# Patient Record
Sex: Female | Born: 1980
Health system: Southern US, Community
[De-identification: ages and names within clinical notes are randomized; demographics above are authoritative.]

## PROBLEM LIST (undated history)

## (undated) ENCOUNTER — Inpatient Hospital Stay (HOSPITAL_COMMUNITY): Payer: Medicaid Other

## (undated) ENCOUNTER — Inpatient Hospital Stay (HOSPITAL_COMMUNITY): Payer: Self-pay

## (undated) DIAGNOSIS — Z5189 Encounter for other specified aftercare: Secondary | ICD-10-CM

## (undated) DIAGNOSIS — I10 Essential (primary) hypertension: Secondary | ICD-10-CM

## (undated) DIAGNOSIS — K219 Gastro-esophageal reflux disease without esophagitis: Secondary | ICD-10-CM

## (undated) DIAGNOSIS — F32A Depression, unspecified: Secondary | ICD-10-CM

## (undated) DIAGNOSIS — F329 Major depressive disorder, single episode, unspecified: Secondary | ICD-10-CM

## (undated) DIAGNOSIS — O09299 Supervision of pregnancy with other poor reproductive or obstetric history, unspecified trimester: Secondary | ICD-10-CM

## (undated) DIAGNOSIS — F419 Anxiety disorder, unspecified: Secondary | ICD-10-CM

## (undated) DIAGNOSIS — D649 Anemia, unspecified: Secondary | ICD-10-CM

## (undated) HISTORY — PX: HEMORRHOID SURGERY: SHX153

## (undated) HISTORY — DX: Supervision of pregnancy with other poor reproductive or obstetric history, unspecified trimester: O09.299

---

## 2012-02-01 ENCOUNTER — Encounter (HOSPITAL_COMMUNITY): Payer: Self-pay | Admitting: Emergency Medicine

## 2012-02-01 ENCOUNTER — Emergency Department (HOSPITAL_COMMUNITY)
Admission: EM | Admit: 2012-02-01 | Discharge: 2012-02-01 | Disposition: A | Discharge status: Home / Self Care | Payer: Medicaid Other | Attending: Emergency Medicine | Admitting: Emergency Medicine

## 2012-02-01 DIAGNOSIS — H53149 Visual discomfort, unspecified: Secondary | ICD-10-CM | POA: Insufficient documentation

## 2012-02-01 DIAGNOSIS — H5789 Other specified disorders of eye and adnexa: Secondary | ICD-10-CM | POA: Insufficient documentation

## 2012-02-01 DIAGNOSIS — H11419 Vascular abnormalities of conjunctiva, unspecified eye: Secondary | ICD-10-CM | POA: Insufficient documentation

## 2012-02-01 DIAGNOSIS — H1030 Unspecified acute conjunctivitis, unspecified eye: Secondary | ICD-10-CM | POA: Insufficient documentation

## 2012-02-01 DIAGNOSIS — H1032 Unspecified acute conjunctivitis, left eye: Secondary | ICD-10-CM

## 2012-02-01 DIAGNOSIS — F17200 Nicotine dependence, unspecified, uncomplicated: Secondary | ICD-10-CM | POA: Insufficient documentation

## 2012-02-01 DIAGNOSIS — J3489 Other specified disorders of nose and nasal sinuses: Secondary | ICD-10-CM | POA: Insufficient documentation

## 2012-02-01 DIAGNOSIS — I10 Essential (primary) hypertension: Secondary | ICD-10-CM | POA: Insufficient documentation

## 2012-02-01 DIAGNOSIS — H579 Unspecified disorder of eye and adnexa: Secondary | ICD-10-CM | POA: Insufficient documentation

## 2012-02-01 HISTORY — DX: Essential (primary) hypertension: I10

## 2012-02-01 MED ORDER — POLYMYXIN B-TRIMETHOPRIM 10000-0.1 UNIT/ML-% OP SOLN: 1.0000 [drp] | OPHTHALMIC | Status: AC

## 2012-02-01 MED ORDER — POLYMYXIN B-TRIMETHOPRIM 10000-0.1 UNIT/ML-% OP SOLN
1.0000 [drp] | OPHTHALMIC | Status: DC
  Filled 2012-02-01: qty 10

## 2012-02-01 MED ORDER — FLUORESCEIN SODIUM 1 MG OP STRP
1.0000 | ORAL_STRIP | Freq: Once | OPHTHALMIC | Status: AC
  Administered 2012-02-01: 1 via OPHTHALMIC
  Filled 2012-02-01: qty 1

## 2012-02-01 NOTE — ED Notes (Signed)
Pt states she has irritation to her left eye and thinks she has pink eye

## 2012-02-01 NOTE — ED Provider Notes (Signed)
History     CSN: [id number]  Arrival date & time 02/01/12  2145   First MD Initiated Contact with Patient 02/01/12 2202      Chief Complaint  Patient presents with  . Conjunctivitis    (Consider location/radiation/quality/duration/timing/severity/associated sxs/prior treatment) HPI  31yo female c/o L eye redness x 1 day.  Sts she woke up this am noticing redness to L eye, with mild eye irritation and light sensitivity.  Denies significant pain, eye matted shut, double vision.  Denies recent trauma.  Pt normally wear prescription glasses but just recently switch to contact lenses.  Sts her son was diagnosed with conjunctivitis a few days ago.  Pt also complain of runnynose but denies fever, headache, pressure behind eye, sore throat, neck pain, numbness or weakness.    Past Medical History  Diagnosis Date  . Hypertension     History reviewed. No pertinent past surgical history.  Family History  Problem Relation Age of Onset  . Hypertension Other   . Cancer Other     History  Substance Use Topics  . Smoking status: Current Everyday Smoker -- 0.5 packs/day    Types: Cigarettes  . Smokeless tobacco: Not on file  . Alcohol Use: No    OB History    Grav Para Term Preterm Abortions TAB SAB Ect Mult Living                  Review of Systems  All other systems reviewed and are negative.    Allergies  Review of patient's allergies indicates no known allergies.  Home Medications  No current outpatient prescriptions on file.  BP 164/105  Pulse 83  Temp(Src) 98.7 F (37.1 C) (Oral)  Resp 20  SpO2 100%  LMP 01/17/2012  Physical Exam  Nursing note and vitals reviewed. Constitutional:       Awake, alert, nontoxic appearance  HENT:  Head: Atraumatic.  Right Ear: Hearing normal.  Left Ear: Hearing normal.  Nose: Rhinorrhea present.  Mouth/Throat: Uvula is midline.  Eyes: EOM and lids are normal. Pupils are equal, round, and reactive to light. No foreign bodies  found. Right eye exhibits no discharge and no exudate. No foreign body present in the right eye. Left eye exhibits no discharge and no exudate. No foreign body present in the left eye. Right conjunctiva is not injected. Right conjunctiva has no hemorrhage. Left conjunctiva is injected. Left conjunctiva has no hemorrhage. Right eye exhibits normal extraocular motion. Left eye exhibits normal extraocular motion.  Slit lamp exam:      The left eye shows no corneal abrasion, no corneal flare, no corneal ulcer, no foreign body, no hyphema, no hypopyon, no fluorescein uptake and no anterior chamber bulge.  Neck: Neck supple.  Pulmonary/Chest: Effort normal.  Musculoskeletal: She exhibits no tenderness.       Baseline ROM, no obvious new focal weakness  Skin: No rash noted.  Psychiatric: She has a normal mood and affect.    ED Course  Procedures (including critical care time)  Labs Reviewed - No data to display No results found.   No diagnosis found.    MDM  Conjunctivitis of L eye.  No evidence of chemosis, hypopyon, corneal abrasion, or fb seen.  No complaint of double vision or pressure behind eye worrisome for acute angle glaucoma.  20/50 visual acuity on both eyes.  Will prescribe polytrim as treatment.  Pt voice understanding.  Pt were made aware that her blood pressure is elevated and to have  it recheck at her doctor's office.          Fayrene Helper, PA-C 02/01/12 2305

## 2012-02-01 NOTE — ED Provider Notes (Signed)
Medical screening examination/treatment/procedure(s) were performed by non-physician practitioner and as supervising physician I was immediately available for consultation/collaboration.   Dayton Bailiff, MD 02/01/12 2308

## 2012-03-03 ENCOUNTER — Encounter (HOSPITAL_COMMUNITY): Payer: Self-pay | Admitting: Emergency Medicine

## 2012-03-03 ENCOUNTER — Emergency Department (HOSPITAL_COMMUNITY): Payer: Medicaid Other

## 2012-03-03 ENCOUNTER — Other Ambulatory Visit: Payer: Self-pay

## 2012-03-03 ENCOUNTER — Emergency Department (HOSPITAL_COMMUNITY)
Admission: EM | Admit: 2012-03-03 | Discharge: 2012-03-03 | Disposition: A | Discharge status: Home / Self Care | Payer: Medicaid Other | Attending: Emergency Medicine | Admitting: Emergency Medicine

## 2012-03-03 DIAGNOSIS — R079 Chest pain, unspecified: Secondary | ICD-10-CM | POA: Insufficient documentation

## 2012-03-03 DIAGNOSIS — F17200 Nicotine dependence, unspecified, uncomplicated: Secondary | ICD-10-CM | POA: Insufficient documentation

## 2012-03-03 DIAGNOSIS — I10 Essential (primary) hypertension: Secondary | ICD-10-CM | POA: Insufficient documentation

## 2012-03-03 DIAGNOSIS — R0602 Shortness of breath: Secondary | ICD-10-CM | POA: Insufficient documentation

## 2012-03-03 LAB — POCT I-STAT, CHEM 8
Calcium, Ion: 1.29 mmol/L (ref 1.12–1.32)
Chloride: 102 mEq/L (ref 96–112)
Creatinine, Ser: 0.7 mg/dL (ref 0.50–1.10)
Glucose, Bld: 103 mg/dL — ABNORMAL HIGH (ref 70–99)
Potassium: 3.2 mEq/L — ABNORMAL LOW (ref 3.5–5.1)

## 2012-03-03 LAB — D-DIMER, QUANTITATIVE: D-Dimer, Quant: 0.32 ug/mL-FEU (ref 0.00–0.48)

## 2012-03-03 LAB — URINALYSIS, ROUTINE W REFLEX MICROSCOPIC
Bilirubin Urine: NEGATIVE
Glucose, UA: NEGATIVE mg/dL
Ketones, ur: NEGATIVE mg/dL
Nitrite: NEGATIVE
Protein, ur: NEGATIVE mg/dL
Specific Gravity, Urine: 1.019 (ref 1.005–1.030)
Urobilinogen, UA: 0.2 mg/dL (ref 0.0–1.0)
pH: 6 (ref 5.0–8.0)

## 2012-03-03 LAB — POCT PREGNANCY, URINE: Preg Test, Ur: NEGATIVE

## 2012-03-03 LAB — URINE MICROSCOPIC-ADD ON

## 2012-03-03 MED ORDER — NITROGLYCERIN 0.4 MG SL SUBL: 0.4000 mg | SUBLINGUAL_TABLET | SUBLINGUAL | Status: DC | PRN

## 2012-03-03 MED ORDER — POTASSIUM CHLORIDE CRYS ER 20 MEQ PO TBCR
40.0000 meq | EXTENDED_RELEASE_TABLET | Freq: Once | ORAL | Status: AC
  Administered 2012-03-03: 40 meq via ORAL
  Filled 2012-03-03: qty 2

## 2012-03-03 NOTE — Discharge Instructions (Signed)
Please read over the instructions below. Your chest x-ray, EKG and cardiac enzymes were normal. Your blood pressure though quite elevated upon arrival to the emergency department has now settled into a range closer to normal. You'll need to continue the new medication for your high blood pressure as it may take several days for your blood pressure to respond adequately. You will need to call Monday to arrange follow up with the cardiologist for further evaluation of your chest pain. It is likely they will to arrange an outpatient stress testing. Return for worsening symptoms otherwise follow up as discussed.  Hypertension Hypertension is another name for high blood pressure. High blood pressure may mean that your heart needs to work harder to pump blood. Blood pressure consists of two numbers, which includes a higher number over a lower number (example: 110/72). HOME CARE   Make lifestyle changes as told by your doctor. This may include weight loss and exercise.   Take your blood pressure medicine every day.   Limit how much salt you use.   Stop smoking if you smoke.   Do not use drugs.   Talk to your doctor if you are using decongestants or birth control pills. These medicines might make blood pressure higher.   Females should not drink more than 1 alcoholic drink per day. Males should not drink more than 2 alcoholic drinks per day.   See your doctor as told.  GET HELP RIGHT AWAY IF:   You have a blood pressure reading with a top number of 180 or higher.   You get a very bad headache.   You get blurred or changing vision.   You feel confused.   You feel weak, numb, or faint.   You get chest or belly (abdominal) pain.   You throw up (vomit).   You cannot breathe very well.  MAKE SURE YOU:   Understand these instructions.   Will watch your condition.   Will get help right away if you are not doing well or get worse.  Document Released: 05/30/2008 Document Revised:  12/01/2011 Document Reviewed: 05/30/2008 .Chest Pain (Nonspecific) Chest pain has many causes. Your pain could be caused by something serious, such as a heart attack or a blood clot in the lungs. It could also be caused by something less serious, such as a chest bruise or a virus. Follow up with your doctor. More lab tests or other studies may be needed to find the cause of your pain. Most of the time, nonspecific chest pain will improve within 2 to 3 days of rest and mild pain medicine. HOME CARE  For chest bruises, you may put ice on the sore area for 15 to 20 minutes, 3 to 4 times a day. Do this only if it makes you or your child feel better.   Put ice in a plastic bag.   Place a towel between the skin and the bag.   Rest for the next 2 to 3 days.   Go back to work if the pain improves.   See your doctor if the pain lasts longer than 1 to 2 weeks.   Only take medicine as told by your doctor.   Quit smoking if you smoke.  GET HELP RIGHT AWAY IF:   There is more pain or pain that spreads to the arm, neck, jaw, back, or belly (abdomen).   You or your child has shortness of breath.   You or your child coughs more than  usual or coughs up blood.   You or your child has very bad back or belly pain, feels sick to his or her stomach (nauseous), or throws up (vomits).   You or your child has very bad weakness.   You or your child passes out (faints).   You or your child has a temperature by mouth above 102 F (38.9 C), not controlled by medicine.  Any of these problems may be serious and may be an emergency. Do not wait to see if the problems will go away. Get medical help right away. Call your local emergency services 911 in [place] Do not drive yourself to the hospital. MAKE SURE YOU:   Understand these instructions.   Will watch this condition.   Will get help right away if you or your child is not doing well or gets worse.  Document Released: 05/30/2008 Document Revised:  12/01/2011 Document Reviewed: 05/30/2008 [facility] Patient Information 2012 [place], [place].

## 2012-03-03 NOTE — ED Provider Notes (Signed)
History     CSN: [id number]  Arrival date & time 03/03/12  4098   First MD Initiated Contact with Patient 03/03/12 0258      Chief Complaint  Patient presents with  . Hypertension    HPI: Patient is a 31 y.o. female presenting with chest pain. The history is provided by the nursing home and the patient.  Chest Pain Chest pain occurs constantly. The chest pain is resolved. At its most intense, the pain is at 6/10. The pain is currently at 0/10. The severity of the pain is moderate. The quality of the pain is described as pressure-like. The pain radiates to the right arm. Primary symptoms include shortness of breath. Pertinent negatives for primary symptoms include no fever, no cough, no wheezing, no palpitations, no abdominal pain, no nausea, no vomiting and no dizziness.  Associated symptoms include numbness.  Pertinent negatives for associated symptoms include no weakness. She tried nothing for the symptoms.  Her past medical history is significant for hypertension.  Pertinent negatives for family medical history include: no early MI in family.   Pt reports onset of (R) sided CP at approx 2100 last night that persisted intermittently for a couple of hours and at times her (R) hand felt numb. She attempted to nap priot to going to work tonight and the pain would wake her from sleep. At work she noted the pain was more constant (since 2300) and was associated w/ increasing SOB and mild dizziness. SOB, CP and dizziness have since resolved but the numb tingling sensation to (R) hand persist. Pt saw PCP yesterday for regular "check-up" and was placed on Benazapril for HTN. She has taken one dose of the Benazapril. Pt dx'd w/ HTN during pregnancy last yr, was treatd during preg but not since. Past Medical History  Diagnosis Date  . Hypertension     History reviewed. No pertinent past surgical history.  Family History  Problem Relation Age of Onset  . Hypertension Other   . Cancer Other      History  Substance Use Topics  . Smoking status: Current Everyday Smoker -- 0.5 packs/day    Types: Cigarettes  . Smokeless tobacco: Not on file  . Alcohol Use: No    OB History    Grav Para Term Preterm Abortions TAB SAB Ect Mult Living                  Review of Systems  Constitutional: Negative.  Negative for fever.  HENT: Negative.   Eyes: Negative.   Respiratory: Positive for shortness of breath. Negative for cough and wheezing.   Cardiovascular: Positive for chest pain. Negative for palpitations.  Gastrointestinal: Negative.  Negative for nausea, vomiting and abdominal pain.  Genitourinary: Negative.   Musculoskeletal: Negative.   Skin: Negative.   Neurological: Positive for numbness. Negative for dizziness and weakness.  Hematological: Negative.   Psychiatric/Behavioral: Negative.     Allergies  Review of patient's allergies indicates no known allergies.  Home Medications   Current Outpatient Rx  Name Route Sig Dispense Refill  . BENAZEPRIL-HYDROCHLOROTHIAZIDE 20-25 MG PO TABS Oral Take 1 tablet by mouth daily.    Marland Kitchen BIOTIN PO Oral Take 1 tablet by mouth daily.    Marland Kitchen PRENATAL MULTIVITAMIN CH Oral Take 1 tablet by mouth daily.      BP 121/84  Pulse 74  Temp(Src) 98.2 F (36.8 C) (Oral)  Resp 20  SpO2 98%  LMP 03/01/2012  Physical Exam  Constitutional: She is oriented  to person, place, and time. She appears well-developed and well-nourished.  HENT:  Head: Normocephalic and atraumatic.  Eyes: Conjunctivae are normal.  Neck: Neck supple.  Cardiovascular: Normal rate and regular rhythm.   Pulmonary/Chest: Effort normal and breath sounds normal.  Abdominal: Soft. Bowel sounds are normal.  Musculoskeletal: Normal range of motion.  Neurological: She is alert and oriented to person, place, and time. She has normal strength. No cranial nerve deficit.  Skin: Skin is warm and dry. No erythema.  Psychiatric: She has a normal mood and affect.    ED Course   Procedures   Date: 03/03/2012  Rate: 64  Rhythm: sinus rhythm  QRS Axis: normal  Intervals: normal  ST/T Wave abnormalities: normal  Conduction Disutrbances:none  Narrative Interpretation:   Old EKG Reviewed: none available  Findings and clinical impression discussed w/ pt. Pt has remained pain free and BP has improved. Will plan for d/c home and encouarage pt to arrange f/u w/ [facility] and her PCP for further evaluation. Pt is agreeable w/ plan I have discussed pt w/ Dr Hyacinth Meeker, who has also seen pt, and he is agreeable w/ plan.      Labs Reviewed  POCT I-STAT, CHEM 8 - Abnormal; Notable for the following:    Potassium 3.2 (*)    Glucose, Bld 103 (*)    All other components within normal limits  POCT I-STAT TROPONIN I  URINALYSIS, ROUTINE W REFLEX MICROSCOPIC   Dg Chest 2 View  03/03/2012  *RADIOLOGY REPORT*  Clinical Data: Chest pain, right side  CHEST - 2 VIEW  Comparison: None.  Findings: Lungs are clear. No pleural effusion or pneumothorax. The cardiomediastinal contours are within normal limits. The visualized bones and soft tissues are without significant appreciable abnormality.  IMPRESSION: No acute cardiopulmonary process.  Original Report Authenticated By: Waneta Martins, M.D.     No diagnosis found.    MDM  HPI/PE and clinical findings c/w 1. Chest pain (Atypical, EKG and CXR normal, Trop I neg x 2, D-Dimer neg, pt has remained pain free since arrival to ED) Though ACS considered, pt is at relative low risk category for ACS (+HTN, +smoker,  -diabetes, -recent travel, -HRT or oral contraceptives, -ETOH or drug use, -significant family hx for early MI) 2. HTN (163/109 upon arrival, 132/93 at d/c. Pt just recently started on Benazapril).        Leanne Chang, NP 03/07/12 1229  Medical screening examination/treatment/procedure(s) were performed by non-physician practitioner and as supervising physician I was immediately available for  consultation/collaboration.   Sunnie Nielsen, MD 03/12/12 [id number]

## 2012-03-03 NOTE — ED Notes (Signed)
Patient given discharge paperwork; went over discharge instructions with patient.  Patient instructed to follow up with Dr. Cyndia Diver and [facility] for further evaluation of her chest pain and a possible outpatient stress test; patient instructed to continue taking her blood pressure medication, and to return to the ED for new, worsening, or concerning symptoms.

## 2012-03-03 NOTE — ED Notes (Signed)
Registration at bedside.

## 2012-03-03 NOTE — ED Notes (Signed)
PT. REPORTS ELEVATED BLOOD PRESSURE THIS EVENING 200/170 WITH DIZZINESS .

## 2012-03-03 NOTE — ED Notes (Signed)
Patient requesting work note until Monday (until she can see her doctor).

## 2012-03-03 NOTE — ED Notes (Signed)
Patient currently resting quietly in bed; no respiratory or acute distress noted.  Patient updated on plan of care; informed patient that lab will be by to take another troponin; patient has no other questions or concerns; will continue to monitor.

## 2012-03-03 NOTE — ED Notes (Signed)
Patient has PRN order for nitro; patient currently denies chest pain.  Withholding nitro at this time; EDP aware.

## 2012-03-03 NOTE — ED Notes (Signed)
Patient back from x-ray; currently sitting up in bed; no respiratory or acute distress noted.  Patient updated on plan of care; informed patient that test results are back and that we are waiting on EDP to come and talk about test results.  Patient has no other questions or concerns at this time; will continue to monitor.

## 2012-03-03 NOTE — ED Notes (Signed)
Patient currently resting quietly in bed; no respiratory or acute distress noted.  Patient updated on plan of care; informed patient that we are waiting on discharge paperwork from EDP.  Patient has no other questions or concerns at this time; will continue to monitor.

## 2012-03-03 NOTE — ED Notes (Signed)
Patient complaining of dizziness, right hand/arm pain, and chest pain that started these evening while at work.  Patient had blood pressure taken at work (200 systolic) at 0045; rechecked it at 0130 -- 186/130.  Patient states that she feels "in slow motion".  Patient reports location of chest pain as "right sided"; describes pain as a "tenderness" and "throbbing"; pain radiates down right arm and into right hand.  Rates chest pain 4/10 on the numerical pain scale.  Patient reports history of hypertension only in pregnancy; denies other cardiac history.  Upon arrival to room, patient changed into gown and connected to continuous cardiac, pulse ox, and blood pressure monitor.  Will continue to monitor.

## 2012-07-06 ENCOUNTER — Emergency Department (HOSPITAL_COMMUNITY)
Admission: EM | Admit: 2012-07-06 | Discharge: 2012-07-06 | Disposition: A | Discharge status: Home / Self Care | Payer: Medicaid Other | Attending: Emergency Medicine | Admitting: Emergency Medicine

## 2012-07-06 DIAGNOSIS — F13239 Sedative, hypnotic or anxiolytic dependence with withdrawal, unspecified: Secondary | ICD-10-CM

## 2012-07-06 DIAGNOSIS — F19939 Other psychoactive substance use, unspecified with withdrawal, unspecified: Secondary | ICD-10-CM | POA: Insufficient documentation

## 2012-07-06 DIAGNOSIS — F411 Generalized anxiety disorder: Secondary | ICD-10-CM | POA: Insufficient documentation

## 2012-07-06 DIAGNOSIS — F3289 Other specified depressive episodes: Secondary | ICD-10-CM | POA: Insufficient documentation

## 2012-07-06 DIAGNOSIS — F17200 Nicotine dependence, unspecified, uncomplicated: Secondary | ICD-10-CM | POA: Insufficient documentation

## 2012-07-06 DIAGNOSIS — R112 Nausea with vomiting, unspecified: Secondary | ICD-10-CM | POA: Insufficient documentation

## 2012-07-06 DIAGNOSIS — R109 Unspecified abdominal pain: Secondary | ICD-10-CM | POA: Insufficient documentation

## 2012-07-06 DIAGNOSIS — F1320 Sedative, hypnotic or anxiolytic dependence, uncomplicated: Secondary | ICD-10-CM | POA: Insufficient documentation

## 2012-07-06 DIAGNOSIS — F329 Major depressive disorder, single episode, unspecified: Secondary | ICD-10-CM | POA: Insufficient documentation

## 2012-07-06 LAB — URINALYSIS, ROUTINE W REFLEX MICROSCOPIC
Leukocytes, UA: NEGATIVE
Nitrite: NEGATIVE
Specific Gravity, Urine: 1.03 (ref 1.005–1.030)
Urobilinogen, UA: 1 mg/dL (ref 0.0–1.0)
pH: 6 (ref 5.0–8.0)

## 2012-07-06 LAB — CBC WITH DIFFERENTIAL/PLATELET
Basophils Relative: 0 % (ref 0–1)
Hemoglobin: 13.9 g/dL (ref 12.0–15.0)
Lymphs Abs: 2.9 10*3/uL (ref 0.7–4.0)
Monocytes Relative: 6 % (ref 3–12)
Neutro Abs: 5.7 10*3/uL (ref 1.7–7.7)
Neutrophils Relative %: 62 % (ref 43–77)
Platelets: 299 10*3/uL (ref 150–400)
RBC: 4.29 MIL/uL (ref 3.87–5.11)

## 2012-07-06 LAB — BASIC METABOLIC PANEL
BUN: 14 mg/dL (ref 6–23)
Chloride: 98 mEq/L (ref 96–112)
GFR calc Af Amer: >90 mL/min (ref >90)
Glucose, Bld: 91 mg/dL (ref 70–99)
Potassium: 3.5 mEq/L (ref 3.5–5.1)
Sodium: 133 mEq/L — ABNORMAL LOW (ref 135–145)

## 2012-07-06 LAB — POCT PREGNANCY, URINE: Preg Test, Ur: NEGATIVE

## 2012-07-06 MED ORDER — LORAZEPAM 1 MG PO TABS: 1.0000 mg | ORAL_TABLET | Freq: Three times a day (TID) | ORAL | Status: AC | PRN

## 2012-07-06 MED ORDER — LORAZEPAM 1 MG PO TABS: 1.0000 mg | ORAL_TABLET | Freq: Once | ORAL | Status: DC

## 2012-07-06 MED ORDER — ONDANSETRON HCL 4 MG/2ML IJ SOLN
4.0000 mg | Freq: Once | INTRAMUSCULAR | Status: AC
  Administered 2012-07-06: 4 mg via INTRAVENOUS
  Filled 2012-07-06: qty 2

## 2012-07-06 NOTE — ED Provider Notes (Signed)
History     CSN: [id number]  Arrival date & time 07/06/12  1633   First MD Initiated Contact with Patient 07/06/12 1809      Chief Complaint  Patient presents with  . Emesis  . Abdominal Pain  . Panic Attack    HPI The patient presents with approximately 3 weeks of complaints.  She notes that just prior to the onset of symptoms she stopped taking her Xanax and Klonopin cold [place].  She has a long history of anxiety and depression.  Since the onset of symptoms she has had persistent generalized discomfort, nausea, inability to focus, lightheadedness, intermittent shakiness and panic sensation.  She notes that the symptoms are intermittently more pronounced without clear alleviating factors.  Symptoms seem worse and in closed spaces CT such as elevators. No ongoing dyspnea, focal chest pain, focal abdominal pain, though she notes an unsettled sensation. Past Medical History  Diagnosis Date  . Hypertension     No past surgical history on file.  Family History  Problem Relation Age of Onset  . Hypertension Other   . Cancer Other     History  Substance Use Topics  . Smoking status: Current Everyday Smoker -- 0.5 packs/day    Types: Cigarettes  . Smokeless tobacco: Not on file  . Alcohol Use: No    OB History    Grav Para Term Preterm Abortions TAB SAB Ect Mult Living                  Review of Systems  All other systems reviewed and are negative.    Allergies  Review of patient's allergies indicates no known allergies.  Home Medications   Current Outpatient Rx  Name Route Sig Dispense Refill  . ALPRAZOLAM 2 MG PO TABS Oral Take 2 mg by mouth at bedtime as needed. Anxiety    . BENAZEPRIL-HYDROCHLOROTHIAZIDE 20-25 MG PO TABS Oral Take 1 tablet by mouth daily.    Marland Kitchen BIOTIN PO Oral Take 1 tablet by mouth daily.      BP 115/60  Pulse 65  Temp 98.1 F (36.7 C) (Oral)  Resp 16  SpO2 100%  Physical Exam  Nursing note and vitals reviewed. Constitutional: She  is oriented to person, place, and time. She appears well-developed and well-nourished. No distress.  HENT:  Head: Normocephalic and atraumatic.  Eyes: Conjunctivae and EOM are normal.  Cardiovascular: Normal rate and regular rhythm.   Pulmonary/Chest: Effort normal and breath sounds normal. No stridor. No respiratory distress.  Abdominal: She exhibits no distension.  Musculoskeletal: She exhibits no edema.  Neurological: She is alert and oriented to person, place, and time. No cranial nerve deficit.  Skin: Skin is warm and dry.  Psychiatric: Her mood appears anxious. Her speech is rapid and/or pressured. She is is hyperactive. Thought content is not paranoid and not delusional. She expresses no homicidal and no suicidal ideation.       Patient has good insight into the possibility of withdrawal as a consideration for her symptoms    ED Course  Procedures (including critical care time)  Labs Reviewed  BASIC METABOLIC PANEL - Abnormal; Notable for the following:    Sodium 133 (*)     All other components within normal limits  URINALYSIS, ROUTINE W REFLEX MICROSCOPIC - Abnormal; Notable for the following:    Bilirubin Urine SMALL (*)     All other components within normal limits  CBC WITH DIFFERENTIAL  POCT PREGNANCY, URINE   No results found.  1. Benzodiazepine withdrawal       MDM  This generally well-appearing young female with history of anxiety and depression now presents with weeks of restlessness, and ability to focus, nausea, vomiting on my exam the patient is anxious, but in any distress.  The patient's teeth, history only of hypertension as a medical issue there is low suspicion for acute ongoing pathology beyond likely withdrawal given the patient's abrupt cessation of benzodiazepines.  We discussed this at length, with her sister included, and the patient was discharged in stable condition to follow up with primary care physician to insure appropriate ongoing  care   Gerhard Munch, MD 07/06/12 1943

## 2012-07-06 NOTE — ED Notes (Signed)
Has hx panic attacks, weaned self from xanax over the past month, started having abd pain and nausea in the past couple days,. States not sure if pregnant, or "if this is caused by anxiety" crying.

## 2012-08-14 ENCOUNTER — Emergency Department (HOSPITAL_COMMUNITY)
Admission: EM | Admit: 2012-08-14 | Discharge: 2012-08-14 | Disposition: A | Discharge status: Home / Self Care | Payer: Medicaid Other | Attending: Emergency Medicine | Admitting: Emergency Medicine

## 2012-08-14 ENCOUNTER — Encounter (HOSPITAL_COMMUNITY): Payer: Self-pay | Admitting: Emergency Medicine

## 2012-08-14 DIAGNOSIS — Z760 Encounter for issue of repeat prescription: Secondary | ICD-10-CM | POA: Insufficient documentation

## 2012-08-14 DIAGNOSIS — Z809 Family history of malignant neoplasm, unspecified: Secondary | ICD-10-CM | POA: Insufficient documentation

## 2012-08-14 DIAGNOSIS — H109 Unspecified conjunctivitis: Secondary | ICD-10-CM | POA: Insufficient documentation

## 2012-08-14 DIAGNOSIS — I10 Essential (primary) hypertension: Secondary | ICD-10-CM | POA: Insufficient documentation

## 2012-08-14 DIAGNOSIS — Z8249 Family history of ischemic heart disease and other diseases of the circulatory system: Secondary | ICD-10-CM | POA: Insufficient documentation

## 2012-08-14 DIAGNOSIS — F17200 Nicotine dependence, unspecified, uncomplicated: Secondary | ICD-10-CM | POA: Insufficient documentation

## 2012-08-14 LAB — PREGNANCY, URINE: Preg Test, Ur: NEGATIVE

## 2012-08-14 MED ORDER — POLYMYXIN B-TRIMETHOPRIM 10000-0.1 UNIT/ML-% OP SOLN: 1.0000 [drp] | OPHTHALMIC | Status: AC

## 2012-08-14 MED ORDER — ALPRAZOLAM 0.5 MG PO TABS: 0.5000 mg | ORAL_TABLET | Freq: Three times a day (TID) | ORAL | Status: DC | PRN

## 2012-08-14 MED ORDER — CITALOPRAM HYDROBROMIDE 40 MG PO TABS: 40.0000 mg | ORAL_TABLET | Freq: Every day | ORAL | Status: DC

## 2012-08-14 MED ORDER — BENAZEPRIL-HYDROCHLOROTHIAZIDE 20-25 MG PO TABS: 1.0000 | ORAL_TABLET | Freq: Every day | ORAL | Status: DC

## 2012-08-14 NOTE — ED Provider Notes (Signed)
History     CSN: [id number]  Arrival date & time 08/14/12  1344   First MD Initiated Contact with Patient 08/14/12 1421      Chief Complaint  Patient presents with  . Conjunctivitis  . Medication Refill    (Consider location/radiation/quality/duration/timing/severity/associated sxs/prior treatment) HPI Comments: Patient is a 31 year old female who presents for pink eye. Mother noticed the left eye was slightly red this morning.  Left eye is a watery, no fevers, no eye pain with movement, no change in vision. Patient with URI symptoms. No vomiting, diarrhea, multiple sick contacts in the house. No rash.  Patient is a 31 y.o. female presenting with conjunctivitis. The history is provided by the patient. No language interpreter was used.  Conjunctivitis  The current episode started today. The onset was sudden. The problem occurs rarely. The problem has been unchanged. The problem is mild. Nothing relieves the symptoms. The symptoms are aggravated by movement. Associated symptoms include congestion, rhinorrhea, URI, eye discharge and eye redness. Pertinent negatives include no fever, no decreased vision, no double vision, no eye itching, no photophobia, no diarrhea, no nausea, no vomiting, no ear discharge, no ear pain, no headaches, no hearing loss, no sore throat, no stridor, no swollen glands, no neck stiffness, no cough, no rash, no diaper rash and no eye pain. She has been behaving normally. She has been eating and drinking normally. Urine output has been normal. There were sick contacts at home. She has received no recent medical care.    Past Medical History  Diagnosis Date  . Hypertension     History reviewed. No pertinent past surgical history.  Family History  Problem Relation Age of Onset  . Hypertension Other   . Cancer Other     History  Substance Use Topics  . Smoking status: Current Everyday Smoker -- 0.5 packs/day    Types: Cigarettes  . Smokeless tobacco: Not on  file  . Alcohol Use: No    OB History    Grav Para Term Preterm Abortions TAB SAB Ect Mult Living                  Review of Systems  Constitutional: Negative for fever.  HENT: Positive for congestion and rhinorrhea. Negative for hearing loss, ear pain, sore throat and ear discharge.   Eyes: Positive for discharge and redness. Negative for double vision, photophobia, pain and itching.  Respiratory: Negative for cough and stridor.   Gastrointestinal: Negative for nausea, vomiting and diarrhea.  Skin: Negative for rash.  Neurological: Negative for headaches.  All other systems reviewed and are negative.    Allergies  Review of patient's allergies indicates no known allergies.  Home Medications   Current Outpatient Rx  Name Route Sig Dispense Refill  . BIOTIN PO Oral Take 1 tablet by mouth daily.    Marland Kitchen PRENATAL 27-0.8 MG PO TABS Oral Take 1 tablet by mouth daily.    Marland Kitchen ALPRAZOLAM 0.5 MG PO TABS Oral Take 1 tablet (0.5 mg total) by mouth 3 (three) times daily as needed. For anxiety 30 tablet 0  . BENAZEPRIL-HYDROCHLOROTHIAZIDE 20-25 MG PO TABS Oral Take 1 tablet by mouth daily. 30 tablet 0  . CITALOPRAM HYDROBROMIDE 40 MG PO TABS Oral Take 1 tablet (40 mg total) by mouth daily. 30 tablet 0  . POLYMYXIN B-TRIMETHOPRIM 10000-0.1 UNIT/ML-% OP SOLN Both Eyes Place 1 drop into both eyes every 4 (four) hours. 10 mL 0    BP 127/86  Pulse 89  Temp  98.6 F (37 C) (Oral)  Resp 20  Wt 194 lb (87.998 kg)  SpO2 100%  Physical Exam  Nursing note and vitals reviewed. Constitutional: She is oriented to person, place, and time. She appears well-developed and well-nourished.  HENT:  Head: Normocephalic and atraumatic.  Right Ear: External ear normal.  Left Ear: External ear normal.  Mouth/Throat: Oropharynx is clear and moist.  Eyes: EOM are normal.       Left eye with conjunctival injection, but normal eye movement, no pain with eye movement  Neck: Normal range of motion. Neck supple.   Cardiovascular: Normal rate, normal heart sounds and intact distal pulses.   Pulmonary/Chest: Effort normal and breath sounds normal.  Abdominal: Soft. Bowel sounds are normal. There is no tenderness. There is no rebound.  Musculoskeletal: Normal range of motion.  Neurological: She is alert and oriented to person, place, and time.  Skin: Skin is warm.    ED Course  Procedures (including critical care time)   Labs Reviewed  PREGNANCY, URINE   No results found.   1. Conjunctivitis       MDM  31 year old with conjunctivitis. Likely viral origin given the multiple sick contacts in the house. However will place on [place] so she may return to work. We'll also refill other medications she has not been able to find a doctor with closing of [facility]        Chrystine Oiler, MD 08/14/12 [phone]

## 2012-08-14 NOTE — ED Notes (Signed)
Mother states she was sent home from work because of pink eye. Mother left eye reddened. Mother states she also has not found a physician for treatment of panic attacks and depression and she needs a refill on her medications.

## 2012-08-29 DIAGNOSIS — F17200 Nicotine dependence, unspecified, uncomplicated: Secondary | ICD-10-CM | POA: Insufficient documentation

## 2012-08-29 DIAGNOSIS — H109 Unspecified conjunctivitis: Secondary | ICD-10-CM | POA: Insufficient documentation

## 2012-08-29 DIAGNOSIS — I10 Essential (primary) hypertension: Secondary | ICD-10-CM | POA: Insufficient documentation

## 2012-08-30 ENCOUNTER — Encounter (HOSPITAL_COMMUNITY): Payer: Self-pay | Admitting: Emergency Medicine

## 2012-08-30 ENCOUNTER — Emergency Department (HOSPITAL_COMMUNITY)
Admission: EM | Admit: 2012-08-30 | Discharge: 2012-08-30 | Disposition: A | Discharge status: Home / Self Care | Payer: Self-pay | Attending: Emergency Medicine | Admitting: Emergency Medicine

## 2012-08-30 DIAGNOSIS — H5710 Ocular pain, unspecified eye: Secondary | ICD-10-CM

## 2012-08-30 NOTE — ED Provider Notes (Signed)
History     CSN: [id number]  Arrival date & time 08/29/12  2340   First MD Initiated Contact with Patient 08/30/12 0047      Chief Complaint  Patient presents with  . Conjunctivitis    (Consider location/radiation/quality/duration/timing/severity/associated sxs/prior treatment) Patient is a 31 y.o. female presenting with conjunctivitis and eye pain. The history is provided by the patient.  Conjunctivitis  Associated symptoms include eye pain. Pertinent negatives include no fever, no eye itching, no headaches, no eye discharge and no eye redness.  Eye Pain Pertinent negatives include no fever or headaches. Associated symptoms comments: She was treated for bacterial conjunctivitis last week and symptoms cleared up. Yesterday she started having some discomfort in her right eye without drainage and began taking the eye drops for infection. Symptoms cleared. She is here for recheck. .    Past Medical History  Diagnosis Date  . Hypertension     History reviewed. No pertinent past surgical history.  Family History  Problem Relation Age of Onset  . Hypertension Other   . Cancer Other     History  Substance Use Topics  . Smoking status: Current Everyday Smoker -- 0.5 packs/day    Types: Cigarettes  . Smokeless tobacco: Not on file  . Alcohol Use: No    OB History    Grav Para Term Preterm Abortions TAB SAB Ect Mult Living                  Review of Systems  Constitutional: Negative for fever.  Eyes: Positive for pain. Negative for discharge, redness, itching and visual disturbance.  Neurological: Negative for headaches.    Allergies  Review of patient's allergies indicates no known allergies.  Home Medications   Current Outpatient Rx  Name Route Sig Dispense Refill  . ALPRAZOLAM 0.5 MG PO TABS Oral Take 1 tablet (0.5 mg total) by mouth 3 (three) times daily as needed. For anxiety 30 tablet 0  . BENAZEPRIL-HYDROCHLOROTHIAZIDE 20-25 MG PO TABS Oral Take 1 tablet by  mouth daily. 30 tablet 0  . BIOTIN PO Oral Take 1 tablet by mouth daily.    Marland Kitchen CITALOPRAM HYDROBROMIDE 40 MG PO TABS Oral Take 1 tablet (40 mg total) by mouth daily. 30 tablet 0  . PRENATAL 27-0.8 MG PO TABS Oral Take 1 tablet by mouth daily.      BP 134/75  Pulse 84  Temp 98 F (36.7 C)  Resp 16  SpO2 99%  Physical Exam  Constitutional: She appears well-developed and well-nourished. No distress.  Eyes: Conjunctivae are normal. Pupils are equal, round, and reactive to light. Right eye exhibits no discharge. Left eye exhibits no discharge.    ED Course  Procedures (including critical care time)  Labs Reviewed - No data to display No results found.   No diagnosis found.  1. Eye pain  MDM  Patient reports her employer requires a note for her to return to work - note given.         Rodena Medin, PA-C 08/30/12 [phone]

## 2012-08-30 NOTE — ED Notes (Signed)
Pt alert, arrives from home, c/o wanting to be cleared from pink eye, seen last week and treated, wants to return to work, resp even unlabored, skin pwd

## 2012-08-30 NOTE — ED Notes (Signed)
Pt sts she had pink eye last week and began experiencing redness and itching yesterday. Patient used drops from last week in her newly itchy eye and would like to be checked to make sure she is clear to go to work. Patient sts work has asked her to come and be cleared.

## 2012-08-30 NOTE — ED Provider Notes (Signed)
Medical screening examination/treatment/procedure(s) were performed by non-physician practitioner and as supervising physician I was immediately available for consultation/collaboration.  Victor Langenbach M Keylin Podolsky, MD 08/30/12 0733 

## 2012-12-26 NOTE — L&D Delivery Note (Signed)
Delivery Note At 9:22 AM a viable female was delivered via Vaginal, Spontaneous Delivery (Presentation: Right Occiput Posterior).  APGAR: 9, 9; weight 6 lb 7 oz (2920 g).   Placenta status: Intact, Spontaneous.  Cord: 3 vessels with the following complications: None.  Cord pH: none  Anesthesia: Epidural Local  Episiotomy: None Lacerations: 1st degree Suture Repair: none Est. Blood Loss (mL): 300  Mom to postpartum.  Baby to Couplet care / Skin to Skin.  Lennix Kneisel A 11/20/2013, 12:31 PM

## 2013-04-27 ENCOUNTER — Emergency Department (HOSPITAL_COMMUNITY)
Admission: EM | Admit: 2013-04-27 | Discharge: 2013-04-27 | Disposition: A | Discharge status: Home / Self Care | Payer: Self-pay | Attending: Emergency Medicine | Admitting: Emergency Medicine

## 2013-04-27 ENCOUNTER — Emergency Department (HOSPITAL_COMMUNITY): Payer: Self-pay

## 2013-04-27 ENCOUNTER — Encounter (HOSPITAL_COMMUNITY): Payer: Self-pay | Admitting: Emergency Medicine

## 2013-04-27 DIAGNOSIS — F17200 Nicotine dependence, unspecified, uncomplicated: Secondary | ICD-10-CM | POA: Insufficient documentation

## 2013-04-27 DIAGNOSIS — I10 Essential (primary) hypertension: Secondary | ICD-10-CM | POA: Insufficient documentation

## 2013-04-27 DIAGNOSIS — Z331 Pregnant state, incidental: Secondary | ICD-10-CM | POA: Insufficient documentation

## 2013-04-27 DIAGNOSIS — R109 Unspecified abdominal pain: Secondary | ICD-10-CM | POA: Insufficient documentation

## 2013-04-27 DIAGNOSIS — Z3201 Encounter for pregnancy test, result positive: Secondary | ICD-10-CM | POA: Insufficient documentation

## 2013-04-27 LAB — WET PREP, GENITAL
Trich, Wet Prep: NONE SEEN
Yeast Wet Prep HPF POC: NONE SEEN

## 2013-04-27 LAB — BASIC METABOLIC PANEL
BUN: 8 mg/dL (ref 6–23)
Chloride: 103 mEq/L (ref 96–112)
Creatinine, Ser: 0.56 mg/dL (ref 0.50–1.10)
GFR calc Af Amer: >90 mL/min (ref >90)
GFR calc non Af Amer: >90 mL/min (ref >90)

## 2013-04-27 LAB — URINALYSIS, ROUTINE W REFLEX MICROSCOPIC
Bilirubin Urine: NEGATIVE
pH: 6 (ref 5.0–8.0)

## 2013-04-27 LAB — PREGNANCY, URINE: Preg Test, Ur: POSITIVE — AB

## 2013-04-27 LAB — CBC
HCT: 31.4 % — ABNORMAL LOW (ref 36.0–46.0)
MCHC: 35.7 g/dL (ref 30.0–36.0)
MCV: 89.7 fL (ref 78.0–100.0)
RDW: 12.2 % (ref 11.5–15.5)

## 2013-04-27 MED ORDER — HYDROCODONE-ACETAMINOPHEN 5-325 MG PO TABS: 1.0000 | ORAL_TABLET | ORAL | Status: DC | PRN

## 2013-04-27 MED ORDER — PRENATAL COMPLETE 14-0.4 MG PO TABS: 1.0000 | ORAL_TABLET | Freq: Every day | ORAL | Status: DC

## 2013-04-27 MED ORDER — OXYCODONE-ACETAMINOPHEN 5-325 MG PO TABS
1.0000 | ORAL_TABLET | Freq: Once | ORAL | Status: AC
  Administered 2013-04-27: 1 via ORAL
  Filled 2013-04-27 (×2): qty 1

## 2013-04-27 NOTE — ED Notes (Signed)
Pt reports abdominal pain, nausea, constipation, and headache for 3 days but worse with sharp pains today. NAD. Pt sts she is pregnant unknown weeks but last menstrual cycle was feb 6. NAD.

## 2013-04-27 NOTE — ED Provider Notes (Signed)
History     CSN: [id number]  Arrival date & time 04/27/13  4098   First MD Initiated Contact with Patient 04/27/13 1900      Chief Complaint  Patient presents with  . Abdominal Pain     The history is provided by the patient.   patient reports worsening lower abdominal pain over the past 2-3 days.  She feels like the pain is more located in her vagina.  She has had positive pregnancy test at home.  She is a G5 P5 A0.  No vaginal bleeding.  No vaginal discharge.  No dysuria or urinary frequency.  She's had some constipation.  She denies vomiting.  She's had some nausea in the morning.  She's also had mild headache for the past 2-3 days.  She reports worsening sharp lower abdominal pain today.  Last normal menstrual cycle was February 6.  No other complaints.  Symptoms are mild to moderate in severity.  Nothing worsens or improves her pain.  No pain with intercourse.  Past Medical History  Diagnosis Date  . Hypertension     History reviewed. No pertinent past surgical history.  Family History  Problem Relation Age of Onset  . Hypertension Other   . Cancer Other     History  Substance Use Topics  . Smoking status: Current Every Day Smoker -- 0.50 packs/day    Types: Cigarettes  . Smokeless tobacco: Not on file  . Alcohol Use: No    OB History   Grav Para Term Preterm Abortions TAB SAB Ect Mult Living                  Review of Systems  Gastrointestinal: Positive for abdominal pain.  All other systems reviewed and are negative.    Allergies  Review of patient's allergies indicates no known allergies.  Home Medications   Current Outpatient Rx  Name  Route  Sig  Dispense  Refill  . Prenatal Vit-Fe Fumarate-FA (MULTIVITAMIN-PRENATAL) 27-0.8 MG TABS   Oral   Take 1 tablet by mouth daily.         Marland Kitchen HYDROcodone-acetaminophen (NORCO/VICODIN) 5-325 MG per tablet   Oral   Take 1 tablet by mouth every 4 (four) hours as needed for pain.   15 tablet   0   .  Prenatal Vit-Fe Fumarate-FA (PRENATAL COMPLETE) 14-0.4 MG TABS   Oral   Take 1 capsule by mouth daily.   60 each   2     BP 135/79  Pulse 86  Temp(Src) 98.1 F (36.7 C)  Ht 5\' 6"  (1.676 m)  Wt 192 lb (87.091 kg)  BMI 31 kg/m2  SpO2 98%  Physical Exam  Nursing note and vitals reviewed. Constitutional: She is oriented to person, place, and time. She appears well-developed and well-nourished. No distress.  HENT:  Head: Normocephalic and atraumatic.  Eyes: EOM are normal.  Neck: Normal range of motion.  Cardiovascular: Normal rate, regular rhythm and normal heart sounds.   Pulmonary/Chest: Effort normal and breath sounds normal.  Abdominal: Soft. She exhibits no distension. There is no tenderness. There is no rebound and no guarding.  Genitourinary:  Normal external genitalia.  Normal cervix.  No cervical motion tenderness.  No cervical bleeding.  No cervical discharge.  No adnexal masses or fullness.  Musculoskeletal: Normal range of motion.  Neurological: She is alert and oriented to person, place, and time.  Skin: Skin is warm and dry.  Psychiatric: She has a normal mood and affect. Judgment  normal.    ED Course  Procedures (including critical care time)  Labs Reviewed  WET PREP, GENITAL - Abnormal; Notable for the following:    Clue Cells Wet Prep HPF POC FEW (*)    WBC, Wet Prep HPF POC FEW (*)    All other components within normal limits  PREGNANCY, URINE - Abnormal; Notable for the following:    Preg Test, Ur POSITIVE (*)    All other components within normal limits  HCG, SERUM, QUALITATIVE - Abnormal; Notable for the following:    Preg, Serum POSITIVE (*)    All other components within normal limits  CBC - Abnormal; Notable for the following:    RBC 3.50 (*)    Hemoglobin 11.2 (*)    HCT 31.4 (*)    All other components within normal limits  BASIC METABOLIC PANEL - Abnormal; Notable for the following:    Glucose, Bld 104 (*)    All other components within  normal limits  GC/CHLAMYDIA PROBE AMP  URINALYSIS, ROUTINE W REFLEX MICROSCOPIC   US Ob Comp Less 14 Wks  04/27/2013  *RADIOLOGY REPORT*  Clinical Data: Pelvic pain, positive pregnancy test  OBSTETRIC <14 WK [place] AND TRANSVAGINAL OB US  Technique:  Both transabdominal and transvaginal ultrasound examinations were performed for complete evaluation of the gestation as well as the maternal uterus, adnexal regions, and pelvic cul-de-sac.  Transvaginal technique was performed to assess early pregnancy.  Comparison:  None.  Intrauterine gestational sac:  Visualized/normal in shape. Yolk sac: Identified Embryo: Identified Cardiac Activity: Identified Heart Rate: 163 bpm  CRL: 13  mm  7 w  4 d         [place] EDC: 12/10/2013  Maternal uterus/adnexae: Normal sonographic appearance to the ovaries.  No subchorionic hemorrhage.  No free fluid.  IMPRESSION: Single intrauterine gestation with cardiac activity documented. Estimated age of 7 weeks 4 days by crown-rump length.   Original Report Authenticated By: Jearld Lesch, M.D.    US Ob Transvaginal  04/27/2013  *RADIOLOGY REPORT*  Clinical Data: Pelvic pain, positive pregnancy test  OBSTETRIC <14 WK [place] AND TRANSVAGINAL OB US  Technique:  Both transabdominal and transvaginal ultrasound examinations were performed for complete evaluation of the gestation as well as the maternal uterus, adnexal regions, and pelvic cul-de-sac.  Transvaginal technique was performed to assess early pregnancy.  Comparison:  None.  Intrauterine gestational sac:  Visualized/normal in shape. Yolk sac: Identified Embryo: Identified Cardiac Activity: Identified Heart Rate: 163 bpm  CRL: 13  mm  7 w  4 d         [place] EDC: 12/10/2013  Maternal uterus/adnexae: Normal sonographic appearance to the ovaries.  No subchorionic hemorrhage.  No free fluid.  IMPRESSION: Single intrauterine gestation with cardiac activity documented. Estimated age of 7 weeks 4 days by crown-rump length.   Original Report Authenticated  By: Jearld Lesch, M.D.    I personally reviewed the imaging tests through PACS system I reviewed available ER/hospitalization records through the EMR   1. Abdominal pain   2. IUP (intrauterine pregnancy), incidental       MDM  Well-appearing.  The patient feels much better at this time.  Intrauterine pregnancy.  Home with prenatal vitamins.  OB/GYN followup.  Patient understands to return the emergency department for new or worsening symptoms        Lyanne Co, MD 04/27/13 2147

## 2013-04-29 LAB — GC/CHLAMYDIA PROBE AMP
CT Probe RNA: NEGATIVE
GC Probe RNA: NEGATIVE

## 2013-07-01 ENCOUNTER — Emergency Department (HOSPITAL_COMMUNITY): Payer: Medicaid Other

## 2013-07-01 ENCOUNTER — Encounter (HOSPITAL_COMMUNITY): Payer: Self-pay | Admitting: Emergency Medicine

## 2013-07-01 ENCOUNTER — Emergency Department (HOSPITAL_COMMUNITY)
Admission: EM | Admit: 2013-07-01 | Discharge: 2013-07-01 | Disposition: A | Discharge status: Home / Self Care | Payer: Medicaid Other | Attending: Emergency Medicine | Admitting: Emergency Medicine

## 2013-07-01 DIAGNOSIS — Z79899 Other long term (current) drug therapy: Secondary | ICD-10-CM | POA: Insufficient documentation

## 2013-07-01 DIAGNOSIS — Z8659 Personal history of other mental and behavioral disorders: Secondary | ICD-10-CM | POA: Insufficient documentation

## 2013-07-01 DIAGNOSIS — R110 Nausea: Secondary | ICD-10-CM | POA: Insufficient documentation

## 2013-07-01 DIAGNOSIS — I10 Essential (primary) hypertension: Secondary | ICD-10-CM | POA: Insufficient documentation

## 2013-07-01 DIAGNOSIS — R51 Headache: Secondary | ICD-10-CM | POA: Insufficient documentation

## 2013-07-01 DIAGNOSIS — J329 Chronic sinusitis, unspecified: Secondary | ICD-10-CM

## 2013-07-01 DIAGNOSIS — F17200 Nicotine dependence, unspecified, uncomplicated: Secondary | ICD-10-CM | POA: Insufficient documentation

## 2013-07-01 DIAGNOSIS — R109 Unspecified abdominal pain: Secondary | ICD-10-CM

## 2013-07-01 DIAGNOSIS — R6883 Chills (without fever): Secondary | ICD-10-CM | POA: Insufficient documentation

## 2013-07-01 DIAGNOSIS — O219 Vomiting of pregnancy, unspecified: Secondary | ICD-10-CM | POA: Insufficient documentation

## 2013-07-01 DIAGNOSIS — R12 Heartburn: Secondary | ICD-10-CM | POA: Insufficient documentation

## 2013-07-01 DIAGNOSIS — O9989 Other specified diseases and conditions complicating pregnancy, childbirth and the puerperium: Secondary | ICD-10-CM | POA: Insufficient documentation

## 2013-07-01 HISTORY — DX: Depression, unspecified: F32.A

## 2013-07-01 HISTORY — DX: Anxiety disorder, unspecified: F41.9

## 2013-07-01 HISTORY — DX: Major depressive disorder, single episode, unspecified: F32.9

## 2013-07-01 LAB — OB RESULTS CONSOLE GC/CHLAMYDIA
Chlamydia: NEGATIVE
Gonorrhea: NEGATIVE

## 2013-07-01 LAB — CBC WITH DIFFERENTIAL/PLATELET
Basophils Relative: 0 % (ref 0–1)
Eosinophils Absolute: 0.2 10*3/uL (ref 0.0–0.7)
Eosinophils Relative: 2 % (ref 0–5)
Lymphs Abs: 2.4 10*3/uL (ref 0.7–4.0)
MCH: 32 pg (ref 26.0–34.0)
MCHC: 34.9 g/dL (ref 30.0–36.0)
MCV: 91.8 fL (ref 78.0–100.0)
Neutrophils Relative %: 67 % (ref 43–77)
Platelets: 218 10*3/uL (ref 150–400)

## 2013-07-01 LAB — LIPASE, BLOOD: Lipase: 21 U/L (ref 11–59)

## 2013-07-01 LAB — COMPREHENSIVE METABOLIC PANEL
Albumin: 2.9 g/dL — ABNORMAL LOW (ref 3.5–5.2)
Alkaline Phosphatase: 31 U/L — ABNORMAL LOW (ref 39–117)
BUN: 6 mg/dL (ref 6–23)
Calcium: 8.7 mg/dL (ref 8.4–10.5)
GFR calc Af Amer: >90 mL/min (ref >90)
Glucose, Bld: 85 mg/dL (ref 70–99)
Potassium: 3.2 mEq/L — ABNORMAL LOW (ref 3.5–5.1)
Sodium: 136 mEq/L (ref 135–145)
Total Protein: 6.2 g/dL (ref 6.0–8.3)

## 2013-07-01 LAB — WET PREP, GENITAL
Clue Cells Wet Prep HPF POC: NONE SEEN
Trich, Wet Prep: NONE SEEN
Yeast Wet Prep HPF POC: NONE SEEN

## 2013-07-01 LAB — URINALYSIS W MICROSCOPIC + REFLEX CULTURE
Bilirubin Urine: NEGATIVE
Ketones, ur: NEGATIVE mg/dL
Nitrite: NEGATIVE
pH: 6 (ref 5.0–8.0)

## 2013-07-01 MED ORDER — SODIUM CHLORIDE 0.9 % IV BOLUS (SEPSIS)
500.0000 mL | Freq: Once | INTRAVENOUS | Status: AC
  Administered 2013-07-01: 500 mL via INTRAVENOUS

## 2013-07-01 MED ORDER — SODIUM CHLORIDE 0.9 % IV SOLN
INTRAVENOUS | Status: DC
  Administered 2013-07-01: 10:00:00 via INTRAVENOUS

## 2013-07-01 MED ORDER — ACETAMINOPHEN 325 MG PO TABS
650.0000 mg | ORAL_TABLET | Freq: Once | ORAL | Status: AC
  Administered 2013-07-01: 650 mg via ORAL
  Filled 2013-07-01: qty 2

## 2013-07-01 NOTE — ED Notes (Signed)
Pt states that she started having a headache and lower right side pain (lower abd/groin area) . Pt states that when she went to work today she could hardly walk due to the pain. Pt states she is [redacted] weeks pregnant.

## 2013-07-01 NOTE — ED Notes (Signed)
Patient transported to Ultrasound 

## 2013-07-01 NOTE — ED Notes (Signed)
Patient transported to MRI 

## 2013-07-01 NOTE — ED Notes (Signed)
Pt states that she has been dizzy this morning. 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

## 2013-07-01 NOTE — Consult Note (Signed)
Reason for Consult: RLQ pain Referring Physician: [facility] (ER) PCP:  NONE OB-GYN:  NONE Paula Dominguez is an 32 y.o. female.  HPI: 31 y/o with headache and some abdominal pain came to ER, she says mostly for her headache.  She complained of some RLQ discomfort, and refused MRI. She is in her 17th week of pregnancy, she just could not tolerated MRI and sedatives are not considered safe.  We were ask to see for evaluation of possible appendicitis. Again she currently complains more of headache than abdominal pain.  Currently no nausea, no vomiting.  She has had both with her pregnancy this time.  Past Medical History  Diagnosis Date   Possible Migraines  She remembers being on Topamax at one time.   . Hypertension     History reviewed. No pertinent past surgical history.  Family History  Problem Relation Age of Onset  . Hypertension Other   . Cancer Other     Social History:  reports that she has been smoking Cigarettes.  She has been smoking about 0.50 packs per day. She does not have any smokeless tobacco history on file. She reports that she does not drink alcohol or use illicit drugs. 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    Allergies: No Known Allergies  Medications:  Prior to Admission medications   Medication Sig Start Date End Date Taking? Authorizing Provider  Prenatal Vit-Fe Fumarate-FA (MULTIVITAMIN-PRENATAL) 27-0.8 MG TABS Take 1 tablet by mouth daily.   Yes Historical Provider, MD    Prior to Admission:  (Not in a hospital admission) Scheduled:  Continuous: . sodium chloride 150 mL/hr at 07/01/13 0938   PRN: Anti-infectives   None      Results for orders placed during the hospital encounter of 07/01/13 (from the past 48 hour(s))  URINALYSIS W MICROSCOPIC + REFLEX CULTURE     Status: Abnormal   Collection Time    07/01/13  9:12 AM      Result Value Range   Color, Urine  YELLOW  YELLOW   APPearance CLOUDY (*) CLEAR   Specific Gravity, Urine 1.029  1.005 - 1.030   pH 6.0  5.0 - 8.0   Glucose, UA NEGATIVE  NEGATIVE mg/dL   Hgb urine dipstick NEGATIVE  NEGATIVE   Bilirubin Urine NEGATIVE  NEGATIVE   Ketones, ur NEGATIVE  NEGATIVE mg/dL   Protein, ur NEGATIVE  NEGATIVE mg/dL   Urobilinogen, UA 1.0  0.0 - 1.0 mg/dL   Nitrite NEGATIVE  NEGATIVE   Leukocytes, UA TRACE (*) NEGATIVE   WBC, UA 3-6  <3 WBC/hpf   Bacteria, UA RARE  RARE   Squamous Epithelial / LPF FEW (*) RARE   Urine-Other MUCOUS PRESENT    CBC WITH DIFFERENTIAL     Status: Abnormal   Collection Time    07/01/13  9:27 AM      Result Value Range   WBC 9.4  4.0 - 10.5 K/uL   RBC 3.28 (*) 3.87 - 5.11 MIL/uL   Hemoglobin 10.5 (*) 12.0 - 15.0 g/dL   HCT 40.9 (*) 81.1 - 91.4 %   MCV 91.8  78.0 - 100.0 fL   MCH 32.0  26.0 - 34.0 pg   MCHC 34.9  30.0 - 36.0 g/dL   RDW 78.2  95.6 - 21.3 %   Platelets 218  150 - 400 K/uL   Neutrophils Relative %  67  43 - 77 %   Neutro Abs 6.3  1.7 - 7.7 K/uL   Lymphocytes Relative 26  12 - 46 %   Lymphs Abs 2.4  0.7 - 4.0 K/uL   Monocytes Relative 5  3 - 12 %   Monocytes Absolute 0.4  0.1 - 1.0 K/uL   Eosinophils Relative 2  0 - 5 %   Eosinophils Absolute 0.2  0.0 - 0.7 K/uL   Basophils Relative 0  0 - 1 %   Basophils Absolute 0.0  0.0 - 0.1 K/uL  COMPREHENSIVE METABOLIC PANEL     Status: Abnormal   Collection Time    07/01/13  9:27 AM      Result Value Range   Sodium 136  135 - 145 mEq/L   Potassium 3.2 (*) 3.5 - 5.1 mEq/L   Chloride 103  96 - 112 mEq/L   CO2 24  19 - 32 mEq/L   Glucose, Bld 85  70 - 99 mg/dL   BUN 6  6 - 23 mg/dL   Creatinine, Ser 1.61  0.50 - 1.10 mg/dL   Calcium 8.7  8.4 - 09.6 mg/dL   Total Protein 6.2  6.0 - 8.3 g/dL   Albumin 2.9 (*) 3.5 - 5.2 g/dL   AST 10  0 - 37 U/L   ALT 6  0 - 35 U/L   Alkaline Phosphatase 31 (*) 39 - 117 U/L   Total Bilirubin 0.2 (*) 0.3 - 1.2 mg/dL   GFR calc non Af Amer >90  >90 mL/min   GFR calc  Af Amer >90  >90 mL/min   Comment:            The eGFR has been calculated     using the CKD EPI equation.     This calculation has not been     validated in all clinical     situations.     eGFR's persistently     <90 mL/min signify     possible Chronic Kidney Disease.  LIPASE, BLOOD     Status: None   Collection Time    07/01/13  9:27 AM      Result Value Range   Lipase 21  11 - 59 U/L  WET PREP, GENITAL     Status: Abnormal   Collection Time    07/01/13 10:49 AM      Result Value Range   Yeast Wet Prep HPF POC NONE SEEN  NONE SEEN   Trich, Wet Prep NONE SEEN  NONE SEEN   Clue Cells Wet Prep HPF POC NONE SEEN  NONE SEEN   WBC, Wet Prep HPF POC RARE (*) NONE SEEN    US Ob Limited  07/01/2013   *RADIOLOGY REPORT*  Clinical Data: Pelvic pain.  Pregnancy.  LIMITED EMERGENT OBSTETRICAL ULTRASOUND.  ARTERIAL AND VENOUS DOPPLER ASSESSMENT OF THE OVARIES.  Technique: Limited to emergent obstetrical ultrasound was performed transabdominally.  Doppler waveform assessment of the ovaries was also performed.  Comparison:  04/27/2013  Findings: Single living intrauterine pregnancy noted with cardiac activity at 143 beats per minute and spontaneous movement. Variable presentation noted.  Posterior placenta is present without previa.  Subjective assessment of amniotic fluid normal, 3.7 cm pocket observed.  Biparietal diameter 3.7 cm compatible with 17 weeks 2 days gestation.  Expected arterial and venous waveform appearance observed in both ovaries, which are of normal size and echogenicity.  No ovarian mass observed.  The appendix was searched for but not visualized.  IMPRESSION:  1.  Single living intrauterine pregnancy measuring at 17 weeks, 2 days gestation, without complicating feature observed. 2.  Normal Doppler assessment of the ovaries, without evidence of torsion or ovarian lesion. 3.  Nonvisualization of the appendix.   Original Report Authenticated By: Gaylyn Rong, M.D.   [place] Art/ven  Flow Abd Pelv Doppler  07/01/2013   *RADIOLOGY REPORT*  Clinical Data: Pelvic pain.  Pregnancy.  LIMITED EMERGENT OBSTETRICAL ULTRASOUND.  ARTERIAL AND VENOUS DOPPLER ASSESSMENT OF THE OVARIES.  Technique: Limited to emergent obstetrical ultrasound was performed transabdominally.  Doppler waveform assessment of the ovaries was also performed.  Comparison:  04/27/2013  Findings: Single living intrauterine pregnancy noted with cardiac activity at 143 beats per minute and spontaneous movement. Variable presentation noted.  Posterior placenta is present without previa.  Subjective assessment of amniotic fluid normal, 3.7 cm pocket observed.  Biparietal diameter 3.7 cm compatible with 17 weeks 2 days gestation.  Expected arterial and venous waveform appearance observed in both ovaries, which are of normal size and echogenicity.  No ovarian mass observed.  The appendix was searched for but not visualized.  IMPRESSION:  1.  Single living intrauterine pregnancy measuring at 17 weeks, 2 days gestation, without complicating feature observed. 2.  Normal Doppler assessment of the ovaries, without evidence of torsion or ovarian lesion. 3.  Nonvisualization of the appendix.   Original Report Authenticated By: Gaylyn Rong, M.D.    Review of Systems  Constitutional: Positive for chills (some last PM). Negative for fever, weight loss, malaise/fatigue and diaphoresis.  HENT: Negative for hearing loss, ear pain, nosebleeds, congestion, sore throat, tinnitus and ear discharge.   Eyes: Negative.   Respiratory: Negative.  Negative for cough, hemoptysis, sputum production, shortness of breath, wheezing and stridor.        Smoking since age 73  Cardiovascular: Negative.   Gastrointestinal: Positive for heartburn (with pregnancy), nausea (with pregnancy), vomiting (with onset of pregnancy) and abdominal pain (she says it's a minor complaint, primary concern was headache.). Negative for diarrhea, constipation, blood in stool  and melena.  Genitourinary: Negative.   Musculoskeletal: Negative.   Skin: Negative.   Neurological: Positive for headaches ( Possible hx of migraines). Negative for dizziness, tingling, tremors, sensory change, speech change, focal weakness, seizures, loss of consciousness and weakness.  Endo/Heme/Allergies: Negative.   Psychiatric/Behavioral: Negative.    Blood pressure 123/70, pulse 75, temperature 98.1 F (36.7 C), temperature source Oral, resp. rate 20, SpO2 95.00%. Physical Exam  Constitutional: She is oriented to person, place, and time. She appears well-developed and well-nourished.  17 weeks into pregnancy, NAD  HENT:  Head: Normocephalic and atraumatic.  Nose: Nose normal.  Mouth/Throat: No oropharyngeal exudate.  Eyes: Conjunctivae and EOM are normal. Pupils are equal, round, and reactive to light. Right eye exhibits no discharge. Left eye exhibits no discharge. No scleral icterus.  Neck: No JVD present. No tracheal deviation present. No thyromegaly present.  Cardiovascular: Normal rate, regular rhythm, normal heart sounds and intact distal pulses.  Exam reveals no gallop.   No murmur heard. Respiratory: Breath sounds normal. No stridor. No respiratory distress. She has no wheezes. She has no rales. She exhibits no tenderness.  GI: Soft. Bowel sounds are normal. She exhibits no distension and no mass. There is tenderness (minimal discomfort ). There is no rebound and no guarding.  + FOR being pregnant, [redacted] wks gestation  Musculoskeletal: Normal range of motion. She exhibits no edema and no tenderness.  Lymphadenopathy:    She has no  cervical adenopathy.  Neurological: She is alert and oriented to person, place, and time. No cranial nerve deficit.  Skin: Skin is warm and dry. No rash noted. No erythema.  Psychiatric: She has a normal mood and affect. Her behavior is normal. Judgment and thought content normal.    Assessment/Plan: 1. Headache and abdominal pain. 2.  Week 17  of pregnancy with nausea and vomiting since onset of pregnancy 3.  Hx of hypertension 4.  Possible hx of Migraines 5.  No primary or OB care.  Plan:  WBC and labs normal, the only safe way to determine whether she has a process beyond her pregnancy is to do an MRI,  She  Does not want to do that now, and feels she is not sick enough to have appendicitis.  Dr. Johna Sheriff saw and examined pt. It is his opinion there is a low probability she has appendicitis or significant intraabdominal issue aside from her pregnancy. If she has more pain or fever she will need to be seen again and MRI will need to be obtained.  Risk and benefits of surgery were discussed with her if she should need surgery. Dr. Clarene Duke plan's to send her home, we will follow up if she returns to the ER. She was also advised to see OB for prenatal care and she reports planning to do this.  Mansfield Dann 07/01/2013, 1:34 PM

## 2013-07-01 NOTE — Consult Note (Signed)
Patient interviewed and examined, agree with PA note above. She has mild diffuse lower abd tenderness without guarding.  HA is main C/O and WBC normal.  I think she is low risk for having acute appendicitis. I discussed with the patient and her family that MRI would be ideal to R/O appendicitis but she is unable to tolerate this. I would not recommend surgery based on her current clinical presentation. I offered observation in the hospital but she seems reliable and this is probably not necessary. She understands to return to the ER if her symptoms worsen at any time and to return tomorrow for re evaluation if she is not improved. All her and her family's questions were answered.  Mariella Saa MD, FACS  07/01/2013 2:35 PM

## 2013-07-01 NOTE — ED Notes (Signed)
Surgeon and PA at bedside

## 2013-07-01 NOTE — ED Provider Notes (Signed)
History    CSN: [id number] Arrival date & time 07/01/13  0808  First MD Initiated Contact with Patient 07/01/13 [phone]     Chief Complaint  Patient presents with  . Facial Pain  . Abdominal Pain    right side more groin area    HPI Pt was seen at 0905.  Per pt, c/o gradual onset and persistence of constant right lower abd/pelvic "pain" for the past 2 days. Has been associated with mild dysuria. States the pain worsens with palpation of the area and walking. Pt hx G7P5, LMP approx Feb, [place] on 04/27/2013: EGA [redacted] weeks 4/7 days with [facility] 12/10/2013.  Today EGA [redacted] weeks 6/7 days. Denies N/V/D, no CP/SOB, no flank pain, no vaginal bleeding/discharge, no hematuria.  Pt also c/o gradual onset and persistence of constant frontal headache and facial "pain" for the past 2 days. Has been associated with sinus, nasal and ears congestion. States she has had previous hx of "headaches" and recalls she was taking "topamax" for same. Describes this headache as similar to her previous. Denies fevers, no sore throat, no neck pain, no rash. Denies headache was sudden or maximal at onset or at any time. Denies focal motor weakness, no tingling/numbness in extremities, no visual changes.     No OB/GYN Past Medical History  Diagnosis Date  . Hypertension   . Anxiety and depression    History reviewed. No pertinent past surgical history.   Family History  Problem Relation Age of Onset  . Hypertension Other   . Cancer Other    History  Substance Use Topics  . Smoking status: Current Every Day Smoker -- 0.50 packs/day    Types: Cigarettes  . Smokeless tobacco: Not on file  . Alcohol Use: No    Review of Systems ROS: Statement: All systems negative except as marked or noted in the HPI; Constitutional: Negative for fever and chills. ; ; Eyes: Negative for eye pain, redness and discharge. ; ; ENMT: Negative for ear pain, hoarseness, sore throat. +ears congestion, nasal congestion, sinus pressure. ; ;  Cardiovascular: Negative for chest pain, palpitations, diaphoresis, dyspnea and peripheral edema. ; ; Respiratory: Negative for cough, wheezing and stridor. ; ; Gastrointestinal: Negative for nausea, vomiting, diarrhea, blood in stool, hematemesis, jaundice and rectal bleeding. . ; ; Genitourinary: +dysuria. Negative for flank pain and hematuria. ; ;GYN:  +pelvic pain. No vaginal bleeding, no vaginal discharge, no vulvar pain.;; Musculoskeletal: Negative for back pain and neck pain. Negative for swelling and trauma.; ; Skin: Negative for pruritus, rash, abrasions, blisters, bruising and skin lesion.; ; Neuro: Negative for lightheadedness and neck stiffness. Negative for weakness, altered level of consciousness , altered mental status, extremity weakness, paresthesias, involuntary movement, seizure and syncope.       Allergies  Review of patient's allergies indicates no known allergies.  Home Medications   Current Outpatient Rx  Name  Route  Sig  Dispense  Refill  . Prenatal Vit-Fe Fumarate-FA (MULTIVITAMIN-PRENATAL) 27-0.8 MG TABS   Oral   Take 1 tablet by mouth daily.          BP 139/91  Pulse 81  Temp(Src) 98.9 F (37.2 C) (Oral)  Resp 18  SpO2 100% Physical Exam 0910: Physical examination:  Nursing notes reviewed; Vital signs and O2 SAT reviewed;  Constitutional: Well developed, Well nourished, Well hydrated, In no acute distress; Head:  Normocephalic, atraumatic; Eyes: EOMI, PERRL, No scleral icterus; ENMT: TM's clear bilat. +edemetous nasal turbinates bilat with clear rhinorrhea.  +tenderness  to percuss over frontal and bilat maxillary sinuses. Mouth and pharynx normal, Mucous membranes moist; Neck: Supple, Full range of motion, No lymphadenopathy. No meningeal signs.; Cardiovascular: Regular rate and rhythm, No murmur, rub, or gallop; Respiratory: Breath sounds clear & equal bilaterally, No rales, rhonchi, wheezes.  Speaking full sentences with ease, Normal respiratory  effort/excursion; Chest: Nontender, Movement normal; Abdomen: Soft, +RLQ tenderness to palp. No rebound or guarding. Nondistended, Normal bowel sounds; Genitourinary: No CVA tenderness. Pelvic exam performed with permission of pt and female ED tech assist during exam.  External genitalia w/o lesions. Vaginal vault with thick white discharge, no blood in vaginal vault.  Cervix w/o lesions, not friable, os closed, no bleeding. GC/chlam and wet prep obtained and sent to lab.  Bimanual exam w/o CMT, uterine or left adnexal tenderness. +right pelvic tenderness to palp.;;; Extremities: Pulses normal, No tenderness, No edema, No calf edema or asymmetry.; Neuro: AA&Ox3, Major CN grossly intact.  Speech clear. Climbs on and off stretcher by herself. Gait steady and upright. No gross focal motor or sensory deficits in extremities.; Skin: Color normal, Warm, Dry.   ED Course  Procedures   MDM  MDM Reviewed: previous chart, nursing note and vitals Reviewed previous: labs and ultrasound Interpretation: labs and ultrasound   Results for orders placed during the hospital encounter of 07/01/13  WET PREP, GENITAL      Result Value Range   Yeast Wet Prep HPF POC NONE SEEN  NONE SEEN   Trich, Wet Prep NONE SEEN  NONE SEEN   Clue Cells Wet Prep HPF POC NONE SEEN  NONE SEEN   WBC, Wet Prep HPF POC RARE (*) NONE SEEN  URINALYSIS W MICROSCOPIC + REFLEX CULTURE      Result Value Range   Color, Urine YELLOW  YELLOW   APPearance CLOUDY (*) CLEAR   Specific Gravity, Urine 1.029  1.005 - 1.030   pH 6.0  5.0 - 8.0   Glucose, UA NEGATIVE  NEGATIVE mg/dL   Hgb urine dipstick NEGATIVE  NEGATIVE   Bilirubin Urine NEGATIVE  NEGATIVE   Ketones, ur NEGATIVE  NEGATIVE mg/dL   Protein, ur NEGATIVE  NEGATIVE mg/dL   Urobilinogen, UA 1.0  0.0 - 1.0 mg/dL   Nitrite NEGATIVE  NEGATIVE   Leukocytes, UA TRACE (*) NEGATIVE   WBC, UA 3-6  <3 WBC/hpf   Bacteria, UA RARE  RARE   Squamous Epithelial / LPF FEW (*) RARE    Urine-Other MUCOUS PRESENT    CBC WITH DIFFERENTIAL      Result Value Range   WBC 9.4  4.0 - 10.5 K/uL   RBC 3.28 (*) 3.87 - 5.11 MIL/uL   Hemoglobin 10.5 (*) 12.0 - 15.0 g/dL   HCT 09.6 (*) 04.5 - 40.9 %   MCV 91.8  78.0 - 100.0 fL   MCH 32.0  26.0 - 34.0 pg   MCHC 34.9  30.0 - 36.0 g/dL   RDW 81.1  91.4 - 78.2 %   Platelets 218  150 - 400 K/uL   Neutrophils Relative % 67  43 - 77 %   Neutro Abs 6.3  1.7 - 7.7 K/uL   Lymphocytes Relative 26  12 - 46 %   Lymphs Abs 2.4  0.7 - 4.0 K/uL   Monocytes Relative 5  3 - 12 %   Monocytes Absolute 0.4  0.1 - 1.0 K/uL   Eosinophils Relative 2  0 - 5 %   Eosinophils Absolute 0.2  0.0 - 0.7 K/uL  Basophils Relative 0  0 - 1 %   Basophils Absolute 0.0  0.0 - 0.1 K/uL  COMPREHENSIVE METABOLIC PANEL      Result Value Range   Sodium 136  135 - 145 mEq/L   Potassium 3.2 (*) 3.5 - 5.1 mEq/L   Chloride 103  96 - 112 mEq/L   CO2 24  19 - 32 mEq/L   Glucose, Bld 85  70 - 99 mg/dL   BUN 6  6 - 23 mg/dL   Creatinine, Ser 1.61  0.50 - 1.10 mg/dL   Calcium 8.7  8.4 - 09.6 mg/dL   Total Protein 6.2  6.0 - 8.3 g/dL   Albumin 2.9 (*) 3.5 - 5.2 g/dL   AST 10  0 - 37 U/L   ALT 6  0 - 35 U/L   Alkaline Phosphatase 31 (*) 39 - 117 U/L   Total Bilirubin 0.2 (*) 0.3 - 1.2 mg/dL   GFR calc non Af Amer >90  >90 mL/min   GFR calc Af Amer >90  >90 mL/min  LIPASE, BLOOD      Result Value Range   Lipase 21  11 - 59 U/L   US Ob Limited 07/01/2013   *RADIOLOGY REPORT*  Clinical Data: Pelvic pain.  Pregnancy.  LIMITED EMERGENT OBSTETRICAL ULTRASOUND.  ARTERIAL AND VENOUS DOPPLER ASSESSMENT OF THE OVARIES.  Technique: Limited to emergent obstetrical ultrasound was performed transabdominally.  Doppler waveform assessment of the ovaries was also performed.  Comparison:  04/27/2013  Findings: Single living intrauterine pregnancy noted with cardiac activity at 143 beats per minute and spontaneous movement. Variable presentation noted.  Posterior placenta is present  without previa.  Subjective assessment of amniotic fluid normal, 3.7 cm pocket observed.  Biparietal diameter 3.7 cm compatible with 17 weeks 2 days gestation.  Expected arterial and venous waveform appearance observed in both ovaries, which are of normal size and echogenicity.  No ovarian mass observed.  The appendix was searched for but not visualized.  IMPRESSION:  1.  Single living intrauterine pregnancy measuring at 17 weeks, 2 days gestation, without complicating feature observed. 2.  Normal Doppler assessment of the ovaries, without evidence of torsion or ovarian lesion. 3.  Nonvisualization of the appendix.   Original Report Authenticated By: Gaylyn Rong, M.D.   [place] Art/ven Flow Abd Pelv Doppler 07/01/2013   *RADIOLOGY REPORT*  Clinical Data: Pelvic pain.  Pregnancy.  LIMITED EMERGENT OBSTETRICAL ULTRASOUND.  ARTERIAL AND VENOUS DOPPLER ASSESSMENT OF THE OVARIES.  Technique: Limited to emergent obstetrical ultrasound was performed transabdominally.  Doppler waveform assessment of the ovaries was also performed.  Comparison:  04/27/2013  Findings: Single living intrauterine pregnancy noted with cardiac activity at 143 beats per minute and spontaneous movement. Variable presentation noted.  Posterior placenta is present without previa.  Subjective assessment of amniotic fluid normal, 3.7 cm pocket observed.  Biparietal diameter 3.7 cm compatible with 17 weeks 2 days gestation.  Expected arterial and venous waveform appearance observed in both ovaries, which are of normal size and echogenicity.  No ovarian mass observed.  The appendix was searched for but not visualized.  IMPRESSION:  1.  Single living intrauterine pregnancy measuring at 17 weeks, 2 days gestation, without complicating feature observed. 2.  Normal Doppler assessment of the ovaries, without evidence of torsion or ovarian lesion. 3.  Nonvisualization of the appendix.   Original Report Authenticated By: Gaylyn Rong, M.D.    1145:   Pt initially refused MRI, then stated she would "try it."  MRI  staff brought pt back to the ED stating she could not tolerate it. Lengthy d/w pt and several family members regarding: dx testing completed with unremarkable results and need MRI to r/o acute appendicitis, as she is specifically complaining of RLQ pain.  Pt continues to refuse MRI, in front of multiple family members.  I and the ED RN encouraged pt to have the study completed, continues to refuse.  Pt makes her own medical decisions.  Risks of refusal explained to pt and family, including, but not limited to:  Acute appendicitis, peritonitis, abortion, fetal demise, stroke, heart attack, cardiac arrythmia ("irregular heart rate/beat"), "passing out," temporary and/or permanent disability, death.  Pt and family verb understanding and continue to refuse MRI, understanding the consequences of their decision. Pt is agreeable to Surgical consult while in the ED. T/C to General Surgery Dr. Johna Sheriff, case discussed, including:  HPI, pertinent PM/SHx, VS/PE, dx testing, ED course and treatment:  Agreeable to come to ED for consult.    1300:  General Surgeon Dr. Johna Sheriff has come to ED for eval: he states pt continues to refuse MRI, he does not feel she needs emergent surgery at this time for acute appy, pt's pain for him is more diffuse in her pelvic area vs localized in RLQ, he gave her explicit return precautions and states she understood them and can be discharged with f/u tomorrow.  ED RN and I have reviewed the General Surgeon's recommendations with pt, and she and family verb understanding.  Pt wants to leave now. Pt's "headache" is located in her face and sinuses, is not diffuse. No red flags. Pt is not hypertensive, neuro exam is intact, LFT's are normal and there is no protein in her urine. Will tx sinus congestion symptomatically at this time. Dx and testing d/w pt and family.  Questions answered.  Verb understanding, agreeable to d/c home with  outpt f/u.    Laray Anger, DO 07/04/13 1224

## 2013-07-01 NOTE — ED Notes (Signed)
MRI called and stated that pt unable to tolerate the MRI and will be bringing pt back.

## 2013-07-02 ENCOUNTER — Inpatient Hospital Stay (HOSPITAL_COMMUNITY)
Admission: AD | Admit: 2013-07-02 | Discharge: 2013-07-02 | Disposition: A | Discharge status: Home / Self Care | Payer: Medicaid Other | Source: Ambulatory Visit | Attending: Obstetrics & Gynecology | Admitting: Obstetrics & Gynecology

## 2013-07-02 ENCOUNTER — Encounter (HOSPITAL_COMMUNITY): Payer: Self-pay

## 2013-07-02 DIAGNOSIS — O0932 Supervision of pregnancy with insufficient antenatal care, second trimester: Secondary | ICD-10-CM

## 2013-07-02 DIAGNOSIS — G43009 Migraine without aura, not intractable, without status migrainosus: Secondary | ICD-10-CM | POA: Insufficient documentation

## 2013-07-02 DIAGNOSIS — K625 Hemorrhage of anus and rectum: Secondary | ICD-10-CM | POA: Insufficient documentation

## 2013-07-02 DIAGNOSIS — R109 Unspecified abdominal pain: Secondary | ICD-10-CM | POA: Insufficient documentation

## 2013-07-02 DIAGNOSIS — O99891 Other specified diseases and conditions complicating pregnancy: Secondary | ICD-10-CM | POA: Insufficient documentation

## 2013-07-02 LAB — URINALYSIS, ROUTINE W REFLEX MICROSCOPIC
Leukocytes, UA: NEGATIVE
Nitrite: NEGATIVE
Specific Gravity, Urine: >1.03 — ABNORMAL HIGH (ref 1.005–1.030)
Urobilinogen, UA: 1 mg/dL (ref 0.0–1.0)
pH: 6 (ref 5.0–8.0)

## 2013-07-02 LAB — GC/CHLAMYDIA PROBE AMP: CT Probe RNA: NEGATIVE

## 2013-07-02 MED ORDER — BUTALBITAL-APAP-CAFFEINE 50-325-40 MG PO TABS: 1.0000 | ORAL_TABLET | Freq: Four times a day (QID) | ORAL | Status: DC | PRN

## 2013-07-02 MED ORDER — CONCEPT OB 130-92.4-1 MG PO CAPS: 1.0000 | ORAL_CAPSULE | Freq: Every day | ORAL | Status: DC

## 2013-07-02 MED ORDER — PROMETHAZINE HCL 25 MG PO TABS: 25.0000 mg | ORAL_TABLET | Freq: Four times a day (QID) | ORAL | Status: DC | PRN

## 2013-07-02 MED ORDER — BUTALBITAL-APAP-CAFFEINE 50-325-40 MG PO TABS
2.0000 | ORAL_TABLET | Freq: Once | ORAL | Status: AC
  Administered 2013-07-02: 2 via ORAL
  Filled 2013-07-02: qty 2

## 2013-07-02 NOTE — MAU Provider Note (Signed)
Chief Complaint: Headache and Rectal Bleeding  First Provider Initiated Contact with Patient 07/02/13 2104      SUBJECTIVE HPI: Paula Dominguez is a 32 y.o. G7P5015 at [redacted]w[redacted]d by LMP who presents with frontal HA and rectal bleeding. Seen at [facility] ED yesterday primarily for HA, but also minor abd pain. Has history of migraines. Says it feels similar. US showed live SIUP, S=D. Pelvic exam, cultures, wet prep, CBC, CNM, UA, surgical consult done (due to concern about appendicitis). Given Tylenol with no relief of headache. Abdominal pain resolved. Rectal pain occurred today while trying to bowel movement. Last bowel movement this morning. Denies fever, chills, GI complaints (except for nausea since the beginning of pregnancy), urinary complaints, vaginal bleeding, vaginal discharge, aura, photophobia, neck stiffness, difficulties with speech or gait or weakness.  Has not started prenatal care. Negative [place]. Awaiting transfer of insurance.   Past Medical History  Diagnosis Date  . Hypertension   . Anxiety and depression    OB History   Grav Para Term Preterm Abortions TAB SAB Ect Mult Living   7 5 5  0 1  1   5      # Outc Date GA Lbr Len/2nd Wgt Sex Del Anes PTL Lv   1 SAB            2 TRM            3 TRM            4 TRM            5 TRM            6 TRM            7 CUR              History reviewed. No pertinent past surgical history. History   Social History  . Marital Status: Single    Spouse Name: N/A    Number of Children: N/A  . Years of Education: N/A   Occupational History  . Not on file.   Social History Main Topics  . Smoking status: Current Every Day Smoker -- 0.50 packs/day    Types: Cigarettes  . Smokeless tobacco: Not on file  . Alcohol Use: No  . Drug Use: No  . Sexually Active: Not on file   Other Topics Concern  . Not on file   Social History Narrative  . No narrative on file   No current facility-administered medications on file prior to  encounter.   No current outpatient prescriptions on file prior to encounter.   No Known Allergies  ROS: Pertinent items in HPI  OBJECTIVE Blood pressure 123/76, pulse 82, temperature 98.4 F (36.9 C), temperature source Oral, resp. rate 18, height 9\' 8"  (2.946 m), weight 93.498 kg (206 lb 2 oz), last menstrual period 02/28/2013, SpO2 96.00%. GENERAL: Well-developed, well-nourished female in mild distress.  HEENT: Normocephalic. Neck range of motion normal. Mild sinus tenderness.  HEART: normal rate RESP: normal effort ABDOMEN: Soft, non-tender. Gravid, size equals dates. EXTREMITIES: Nontender, no edema NEURO: Alert and oriented SPECULUM EXAM: Normal external female genitalia. Small amount of creamy, white, odorless discharge. No blood in vaginal vault. RECTAL EXAM: No bleeding. Anterior skin tag.   cervix long and closed.  FHTs 153 by doppler.  LAB RESULTS Results for orders placed during the hospital encounter of 07/02/13 (from the past 24 hour(s))  URINALYSIS, ROUTINE W REFLEX MICROSCOPIC     Status: Abnormal   Collection Time  07/02/13  8:05 PM      Result Value Range   Color, Urine YELLOW  YELLOW   APPearance CLEAR  CLEAR   Specific Gravity, Urine >1.030 (*) 1.005 - 1.030   pH 6.0  5.0 - 8.0   Glucose, UA NEGATIVE  NEGATIVE mg/dL   Hgb urine dipstick NEGATIVE  NEGATIVE   Bilirubin Urine NEGATIVE  NEGATIVE   Ketones, ur 15 (*) NEGATIVE mg/dL   Protein, ur NEGATIVE  NEGATIVE mg/dL   Urobilinogen, UA 1.0  0.0 - 1.0 mg/dL   Nitrite NEGATIVE  NEGATIVE   Leukocytes, UA NEGATIVE  NEGATIVE    IMAGING US Ob Limited  07/01/2013   *RADIOLOGY REPORT*  Clinical Data: Pelvic pain.  Pregnancy.  LIMITED EMERGENT OBSTETRICAL ULTRASOUND.  ARTERIAL AND VENOUS DOPPLER ASSESSMENT OF THE OVARIES.  Technique: Limited to emergent obstetrical ultrasound was performed transabdominally.  Doppler waveform assessment of the ovaries was also performed.  Comparison:  04/27/2013  Findings: Single  living intrauterine pregnancy noted with cardiac activity at 143 beats per minute and spontaneous movement. Variable presentation noted.  Posterior placenta is present without previa.  Subjective assessment of amniotic fluid normal, 3.7 cm pocket observed.  Biparietal diameter 3.7 cm compatible with 17 weeks 2 days gestation.  Expected arterial and venous waveform appearance observed in both ovaries, which are of normal size and echogenicity.  No ovarian mass observed.  The appendix was searched for but not visualized.  IMPRESSION:  1.  Single living intrauterine pregnancy measuring at 17 weeks, 2 days gestation, without complicating feature observed. 2.  Normal Doppler assessment of the ovaries, without evidence of torsion or ovarian lesion. 3.  Nonvisualization of the appendix.   Original Report Authenticated By: Gaylyn Rong, M.D.   [place] Art/ven Flow Abd Pelv Doppler  07/01/2013   *RADIOLOGY REPORT*  Clinical Data: Pelvic pain.  Pregnancy.  LIMITED EMERGENT OBSTETRICAL ULTRASOUND.  ARTERIAL AND VENOUS DOPPLER ASSESSMENT OF THE OVARIES.  Technique: Limited to emergent obstetrical ultrasound was performed transabdominally.  Doppler waveform assessment of the ovaries was also performed.  Comparison:  04/27/2013  Findings: Single living intrauterine pregnancy noted with cardiac activity at 143 beats per minute and spontaneous movement. Variable presentation noted.  Posterior placenta is present without previa.  Subjective assessment of amniotic fluid normal, 3.7 cm pocket observed.  Biparietal diameter 3.7 cm compatible with 17 weeks 2 days gestation.  Expected arterial and venous waveform appearance observed in both ovaries, which are of normal size and echogenicity.  No ovarian mass observed.  The appendix was searched for but not visualized.  IMPRESSION:  1.  Single living intrauterine pregnancy measuring at 17 weeks, 2 days gestation, without complicating feature observed. 2.  Normal Doppler assessment of  the ovaries, without evidence of torsion or ovarian lesion. 3.  Nonvisualization of the appendix.   Original Report Authenticated By: Gaylyn Rong, M.D.    MAU COURSE Headache result with Fioricet and by mouth fluids.  ASSESSMENT 1. Migraine headache without aura   2. Rectal bleeding    PLAN Discharge home in stable condition. Pregnancy verification letter given. Increase fluids and fiber and do not strain with bowel movements.     Follow-up Information   Follow up with [facility].      Follow up with [facility] [place] [facility]. (As needed in emergencies)    Contact information:   [address] [id number]  [place] [address] [phone]       Medication List    STOP  taking these medications       prenatal multivitamin Tabs      TAKE these medications       acetaminophen 500 MG tablet  Commonly known as:  TYLENOL  Take 1,000 mg by mouth every 6 (six) hours as needed for pain.     butalbital-acetaminophen-caffeine 50-325-40 MG per tablet  Commonly known as:  FIORICET  Take 1-2 tablets by mouth every 6 (six) hours as needed for headache. Use sparingly. Do take within 4 hours of other Tylenol-containing products.     CONCEPT OB 130-92.4-1 MG Caps  Take 1 tablet by mouth daily.     promethazine 25 MG tablet  Commonly known as:  PHENERGAN  Take 1 tablet (25 mg total) by mouth every 6 (six) hours as needed for nausea.       [place], CNM 07/02/2013  11:13 PM

## 2013-07-02 NOTE — MAU Note (Signed)
Patient is in with c/o persistent headache (history of migraine) and rectal bleeding (this was this evening). Patient was seen at [facility] yesterday for the headache and she states that she have no relief. She denies vaginal bleeding or abdominal pain. She have not started prenatal care, she just moved from [place].

## 2013-07-08 ENCOUNTER — Ambulatory Visit (HOSPITAL_COMMUNITY)
Admission: RE | Admit: 2013-07-08 | Discharge: 2013-07-08 | Disposition: A | Discharge status: Home / Self Care | Payer: Medicaid Other | Source: Ambulatory Visit | Attending: Advanced Practice Midwife | Admitting: Advanced Practice Midwife

## 2013-07-08 DIAGNOSIS — O10019 Pre-existing essential hypertension complicating pregnancy, unspecified trimester: Secondary | ICD-10-CM | POA: Insufficient documentation

## 2013-07-08 DIAGNOSIS — Z363 Encounter for antenatal screening for malformations: Secondary | ICD-10-CM | POA: Insufficient documentation

## 2013-07-08 DIAGNOSIS — Z1389 Encounter for screening for other disorder: Secondary | ICD-10-CM | POA: Insufficient documentation

## 2013-07-08 DIAGNOSIS — O0932 Supervision of pregnancy with insufficient antenatal care, second trimester: Secondary | ICD-10-CM

## 2013-07-08 DIAGNOSIS — O358XX0 Maternal care for other (suspected) fetal abnormality and damage, not applicable or unspecified: Secondary | ICD-10-CM | POA: Insufficient documentation

## 2013-07-30 ENCOUNTER — Encounter: Payer: Self-pay | Admitting: Advanced Practice Midwife

## 2013-08-01 ENCOUNTER — Encounter (HOSPITAL_COMMUNITY): Payer: Self-pay | Admitting: *Deleted

## 2013-08-01 ENCOUNTER — Inpatient Hospital Stay (HOSPITAL_COMMUNITY)
Admission: AD | Admit: 2013-08-01 | Discharge: 2013-08-01 | Disposition: A | Discharge status: Home / Self Care | Payer: Medicaid Other | Source: Ambulatory Visit | Attending: Obstetrics & Gynecology | Admitting: Obstetrics & Gynecology

## 2013-08-01 DIAGNOSIS — R109 Unspecified abdominal pain: Secondary | ICD-10-CM | POA: Insufficient documentation

## 2013-08-01 DIAGNOSIS — O99891 Other specified diseases and conditions complicating pregnancy: Secondary | ICD-10-CM | POA: Insufficient documentation

## 2013-08-01 DIAGNOSIS — O219 Vomiting of pregnancy, unspecified: Secondary | ICD-10-CM

## 2013-08-01 DIAGNOSIS — O210 Mild hyperemesis gravidarum: Secondary | ICD-10-CM | POA: Insufficient documentation

## 2013-08-01 DIAGNOSIS — J020 Streptococcal pharyngitis: Secondary | ICD-10-CM | POA: Insufficient documentation

## 2013-08-01 DIAGNOSIS — R51 Headache: Secondary | ICD-10-CM | POA: Insufficient documentation

## 2013-08-01 DIAGNOSIS — O26892 Other specified pregnancy related conditions, second trimester: Secondary | ICD-10-CM

## 2013-08-01 DIAGNOSIS — O9989 Other specified diseases and conditions complicating pregnancy, childbirth and the puerperium: Secondary | ICD-10-CM

## 2013-08-01 LAB — URINALYSIS, ROUTINE W REFLEX MICROSCOPIC
Leukocytes, UA: NEGATIVE
Nitrite: NEGATIVE
Protein, ur: NEGATIVE mg/dL
Specific Gravity, Urine: >1.03 — ABNORMAL HIGH (ref 1.005–1.030)
Urobilinogen, UA: 1 mg/dL (ref 0.0–1.0)

## 2013-08-01 MED ORDER — LACTATED RINGERS IV BOLUS (SEPSIS)
1000.0000 mL | Freq: Once | INTRAVENOUS | Status: AC
  Administered 2013-08-01: 1000 mL via INTRAVENOUS

## 2013-08-01 MED ORDER — BUTALBITAL-APAP-CAFFEINE 50-325-40 MG PO TABS
1.0000 | ORAL_TABLET | Freq: Once | ORAL | Status: AC
  Administered 2013-08-01: 1 via ORAL
  Filled 2013-08-01: qty 1

## 2013-08-01 NOTE — MAU Provider Note (Signed)
I spoke with and examined patient and agree with resident's note and plan of care.  Dejana Pugsley Ryan Chanda Laperle, MD OB Fellow 08/01/2013 6:21 PM   

## 2013-08-01 NOTE — MAU Note (Signed)
Daughter was at hosp 2 days ago: virus; son yesterday at hosp: strep throat: now she has been vomiting and has headaches, was given rx- for both but can't keep anything down.

## 2013-08-01 NOTE — MAU Provider Note (Signed)
History     CSN: [id number]  Arrival date and time: 08/01/13 1305   None     Chief Complaint  Patient presents with  . Emesis  . Headache   HPI 32 y.o. [id number] at [redacted]w[redacted]d presents for vomiting and headache. Headache is her biggest concern right now. She has a history of constant headaches during this pregnancy, but yesterday it increased in severity. Located "across" her head, feels like pressure, rates as 9/10. Has been nauseated and vomited 9-10 times since yesterday, hasn't been able to keep anything down except for promethazine right before she came to MAU. Has a prescription for fioricet that she filled yesterday but has not been able to keep down. Also has had a sore throat since yesterday. Her children were recently diagnosed with strep throat and viral infection. Had some lower abdominal cramping but it has gone away, denies gush of fluid, bleeding, or vaginal discharge. +FM. Says she gets chest pains 2-3 times a week, feel like pressure but go away; gets short of breath at times but not related to anything and isn't SOB currently; has felt chills throughout the pregnancy but no fevers.  Has measured her BP throughout pregnancy at her work and says average around 150/60, high of 210/90 and 170/100. BP is normal range at MAU 106/62  Prenatal course: No established care yet, would like to be seen at the clinic here, thought she had an appointment 8/25 but it looks like it was for 8/5.  OB History   Grav Para Term Preterm Abortions TAB SAB Ect Mult Living   7 5 5  0 1  1   5     Previous SVD x 5. Says she has a history of high blood pressures and possible pre-eclampsia.  Past Medical History  Diagnosis Date  . Hypertension   . Anxiety and depression    Says she was treated for hypertension when not pregnant with lisinopril, HCTZ, and one other med she can't remember.  No past surgical history on file.  Family History  Problem Relation Age of Onset  . Hypertension Other   .  Cancer Other     History  Substance Use Topics  . Smoking status: Former Smoker -- 0.50 packs/day    Types: Cigarettes    Quit date: 07/27/2013  . Smokeless tobacco: Not on file  . Alcohol Use: No    Allergies: No Known Allergies  Prescriptions prior to admission  Medication Sig Dispense Refill  . acetaminophen (TYLENOL) 500 MG tablet Take 1,000 mg by mouth every 6 (six) hours as needed for pain.      . butalbital-acetaminophen-caffeine (FIORICET) 50-325-40 MG per tablet Take 1-2 tablets by mouth every 6 (six) hours as needed for headache. Use sparingly. Do take within 4 hours of other Tylenol-containing products.  20 tablet  1  . Prenat w/o A Vit-FeFum-FePo-FA (CONCEPT OB) 130-92.4-1 MG CAPS Take 1 tablet by mouth daily.  30 capsule  12  . promethazine (PHENERGAN) 25 MG tablet Take 1 tablet (25 mg total) by mouth every 6 (six) hours as needed for nausea.  30 tablet  1    ROS negative except as above Physical Exam   Blood pressure 106/62, pulse 85, temperature 98.2 F (36.8 C), temperature source Oral, resp. rate 18, weight 93.895 kg (207 lb), last menstrual period 02/28/2013.  Physical Exam General appearance: alert, cooperative and no distress Head: Normocephalic, without obvious abnormality, atraumatic Lungs: clear to auscultation bilaterally Heart: regular rate and rhythm, S1, S2 normal,  no murmur, click, rub or gallop Abdomen: soft, gravid, nontender to palpation, fundal height 21cm Extremities: no edema, redness or tenderness in the calves or thighs Pulses: 2+ and symmetric PT Skin: warm and dry  FHT on doppler 150bpm  MAU Course  Procedures Results for orders placed during the hospital encounter of 08/01/13 (from the past 24 hour(s))  URINALYSIS, ROUTINE W REFLEX MICROSCOPIC     Status: Abnormal   Collection Time    08/01/13  1:40 PM      Result Value Range   Color, Urine YELLOW  YELLOW   APPearance CLEAR  CLEAR   Specific Gravity, Urine >1.030 (*) 1.005 -  1.030   pH 6.0  5.0 - 8.0   Glucose, UA NEGATIVE  NEGATIVE mg/dL   Hgb urine dipstick NEGATIVE  NEGATIVE   Bilirubin Urine NEGATIVE  NEGATIVE   Ketones, ur 15 (*) NEGATIVE mg/dL   Protein, ur NEGATIVE  NEGATIVE mg/dL   Urobilinogen, UA 1.0  0.0 - 1.0 mg/dL   Nitrite NEGATIVE  NEGATIVE   Leukocytes, UA NEGATIVE  NEGATIVE    MDM  UA shows concentrated urine, IV LR bolus PO fluids and fioricet  Assessment and Plan  32 y.o. [id number] at [redacted]w[redacted]d   No vomiting in MAU, tolerated PO crackers and water Fioricet given, stayed down and helped significantly. BPs normal in MAU Patient would like to go, already has prescription for promethazine and fioricet Message sent to clinic to reschedule first prenatal visit, patient to call tomorrow if she does not get called, patient understood instructions. Stable for discharge  Tawni Carnes 08/01/2013, 2:47 PM

## 2013-08-06 ENCOUNTER — Ambulatory Visit (INDEPENDENT_AMBULATORY_CARE_PROVIDER_SITE_OTHER): Payer: Medicaid Other | Admitting: Obstetrics

## 2013-08-06 ENCOUNTER — Encounter: Payer: Self-pay | Admitting: Obstetrics

## 2013-08-06 VITALS — BP 116/74 | Temp 97.2°F | Wt 210.0 lb

## 2013-08-06 DIAGNOSIS — O26892 Other specified pregnancy related conditions, second trimester: Secondary | ICD-10-CM

## 2013-08-06 DIAGNOSIS — K0401 Reversible pulpitis: Secondary | ICD-10-CM | POA: Insufficient documentation

## 2013-08-06 DIAGNOSIS — Z3482 Encounter for supervision of other normal pregnancy, second trimester: Secondary | ICD-10-CM

## 2013-08-06 DIAGNOSIS — O9989 Other specified diseases and conditions complicating pregnancy, childbirth and the puerperium: Secondary | ICD-10-CM

## 2013-08-06 DIAGNOSIS — O26899 Other specified pregnancy related conditions, unspecified trimester: Secondary | ICD-10-CM | POA: Insufficient documentation

## 2013-08-06 DIAGNOSIS — F329 Major depressive disorder, single episode, unspecified: Secondary | ICD-10-CM | POA: Insufficient documentation

## 2013-08-06 DIAGNOSIS — K219 Gastro-esophageal reflux disease without esophagitis: Secondary | ICD-10-CM

## 2013-08-06 DIAGNOSIS — Z3201 Encounter for pregnancy test, result positive: Secondary | ICD-10-CM

## 2013-08-06 DIAGNOSIS — O99340 Other mental disorders complicating pregnancy, unspecified trimester: Secondary | ICD-10-CM

## 2013-08-06 MED ORDER — BUPROPION HCL 100 MG PO TABS: 100.0000 mg | ORAL_TABLET | Freq: Two times a day (BID) | ORAL | Status: DC

## 2013-08-06 MED ORDER — OMEPRAZOLE 20 MG PO CPDR: 20.0000 mg | DELAYED_RELEASE_CAPSULE | Freq: Every day | ORAL | Status: DC

## 2013-08-06 MED ORDER — BUTALBITAL-APAP-CAFFEINE 50-325-40 MG PO TABS: 2.0000 | ORAL_TABLET | Freq: Four times a day (QID) | ORAL | Status: DC | PRN

## 2013-08-06 MED ORDER — CLINDAMYCIN HCL 300 MG PO CAPS: 300.0000 mg | ORAL_CAPSULE | Freq: Three times a day (TID) | ORAL | Status: DC

## 2013-08-06 NOTE — Progress Notes (Signed)
Pulse-85  Subjective:    Paula Dominguez is being seen today for her first obstetrical visit.  This is a planned pregnancy. She is at [redacted]w[redacted]d gestation. Her obstetrical history is significant for pre-eclampsia and smoker. Relationship with FOB: spouse, living together. Patient does not intend to breast feed. Pregnancy history fully reviewed.  Menstrual History: OB History   Grav Para Term Preterm Abortions TAB SAB Ect Mult Living   7 5 5  0 1  1   5       Menarche age: 51  Patient's last menstrual period was 02/28/2013.    The following portions of the patient's history were reviewed and updated as appropriate: allergies, current medications, past family history, past medical history, past social history, past surgical history and problem list.  Review of Systems Pertinent items are noted in HPI.    Objective:    General appearance: alert and no distress Breasts: normal appearance, no masses or tenderness Abdomen: normal findings: soft, non-tender Pelvic: cervix normal in appearance, external genitalia normal, no adnexal masses or tenderness, no cervical motion tenderness and vagina with creamy white discharge.  Uterus enlarged, soft and NT. Extremities: extremities normal, atraumatic, no cyanosis or edema    Assessment:    Pregnancy at [redacted]w[redacted]d weeks    Plan:    Initial labs drawn. Prenatal vitamins.  Counseling provided regarding continued use of seat belts, cessation of alcohol consumption, smoking or use of illicit drugs; infection precautions i.e., influenza/TDAP immunizations, toxoplasmosis,CMV, parvovirus, listeria and varicella; workplace safety, exercise during pregnancy; routine dental care, safe medications, sexual activity, hot tubs, saunas, pools, travel, caffeine use, fish and methlymercury, potential toxins, hair treatments, varicose veins Weight gain recommendations reviewed: underweight/BMI< 18.5--> gain 28 - 40 lbs; normal weight/BMI 18.5 - 24.9--> gain 25 - 35 lbs;  overweight/BMI 25 - 29.9--> gain 15 - 25 lbs; obese/BMI >30->gain  11 - 20 lbs Problem list reviewed and updated. AFP3 discussed: requested. Role of ultrasound in pregnancy discussed; fetal survey: requested. Amniocentesis discussed: not indicated. Follow up in 4 weeks. 50% of 20  min visit spent on counseling and coordination of care.

## 2013-08-07 ENCOUNTER — Encounter: Payer: Self-pay | Admitting: Obstetrics

## 2013-08-07 LAB — OBSTETRIC PANEL
Antibody Screen: NEGATIVE
Eosinophils Relative: 1 % (ref 0–5)
HCT: 30.1 % — ABNORMAL LOW (ref 36.0–46.0)
Hemoglobin: 10 g/dL — ABNORMAL LOW (ref 12.0–15.0)
Lymphocytes Relative: 24 % (ref 12–46)
Lymphs Abs: 2.4 10*3/uL (ref 0.7–4.0)
MCV: 94.1 fL (ref 78.0–100.0)
Monocytes Absolute: 0.5 10*3/uL (ref 0.1–1.0)
Monocytes Relative: 5 % (ref 3–12)
RBC: 3.2 MIL/uL — ABNORMAL LOW (ref 3.87–5.11)
RDW: 14.1 % (ref 11.5–15.5)
Rh Type: POSITIVE
Rubella: 1.17 Index — ABNORMAL HIGH (ref <0.90)
WBC: 10.3 10*3/uL (ref 4.0–10.5)

## 2013-08-07 LAB — URINE CULTURE
Colony Count: NO GROWTH
Organism ID, Bacteria: NO GROWTH

## 2013-08-07 LAB — WET PREP BY MOLECULAR PROBE
Candida species: NEGATIVE
Trichomonas vaginosis: NEGATIVE

## 2013-08-07 LAB — VARICELLA ZOSTER ANTIBODY, IGG: Varicella IgG: 823 Index — ABNORMAL HIGH (ref <135.00)

## 2013-08-07 LAB — HIV ANTIBODY (ROUTINE TESTING W REFLEX): HIV: NONREACTIVE

## 2013-08-21 ENCOUNTER — Encounter: Payer: Self-pay | Admitting: Obstetrics

## 2013-08-21 ENCOUNTER — Ambulatory Visit (INDEPENDENT_AMBULATORY_CARE_PROVIDER_SITE_OTHER): Payer: 59 | Admitting: Obstetrics

## 2013-08-21 VITALS — BP 100/70 | Temp 99.4°F | Wt 211.0 lb

## 2013-08-21 DIAGNOSIS — K219 Gastro-esophageal reflux disease without esophagitis: Secondary | ICD-10-CM

## 2013-08-21 DIAGNOSIS — O219 Vomiting of pregnancy, unspecified: Secondary | ICD-10-CM | POA: Insufficient documentation

## 2013-08-21 MED ORDER — ONDANSETRON 8 MG PO TBDP: 8.0000 mg | ORAL_TABLET | Freq: Three times a day (TID) | ORAL | Status: DC | PRN

## 2013-08-21 MED ORDER — OMEPRAZOLE 20 MG PO CPDR: 20.0000 mg | DELAYED_RELEASE_CAPSULE | Freq: Two times a day (BID) | ORAL | Status: DC

## 2013-08-21 MED ORDER — METOCLOPRAMIDE HCL 5 MG PO TABS: 5.0000 mg | ORAL_TABLET | Freq: Four times a day (QID) | ORAL | Status: DC

## 2013-08-21 NOTE — Progress Notes (Signed)
P 82 Patient reports she is having less movement since she has turned 5  Months- patient also states has painful contraction that are daily. Last episode lasted 20 minutes. Patient states she has a lot of pressure in bottom when they occur.

## 2013-08-22 LAB — POCT URINALYSIS DIPSTICK
Bilirubin, UA: NEGATIVE
Blood, UA: NEGATIVE
Glucose, UA: NEGATIVE
Ketones, UA: NEGATIVE
Leukocytes, UA: NEGATIVE
Nitrite, UA: NEGATIVE
Protein, UA: NEGATIVE
Spec Grav, UA: 1.015
Urobilinogen, UA: NEGATIVE
pH, UA: 6

## 2013-08-22 NOTE — Addendum Note (Signed)
Addended by: Henriette Combs on: 08/22/2013 10:54 AM   Modules accepted: Orders

## 2013-08-28 ENCOUNTER — Encounter: Payer: Medicaid Other | Admitting: Family Medicine

## 2013-09-05 ENCOUNTER — Ambulatory Visit (INDEPENDENT_AMBULATORY_CARE_PROVIDER_SITE_OTHER): Payer: 59 | Admitting: Obstetrics

## 2013-09-05 ENCOUNTER — Encounter: Payer: Self-pay | Admitting: Obstetrics

## 2013-09-05 VITALS — BP 110/74 | Temp 97.5°F | Wt 214.0 lb

## 2013-09-05 DIAGNOSIS — Z3482 Encounter for supervision of other normal pregnancy, second trimester: Secondary | ICD-10-CM

## 2013-09-05 DIAGNOSIS — Z3480 Encounter for supervision of other normal pregnancy, unspecified trimester: Secondary | ICD-10-CM

## 2013-09-05 NOTE — Progress Notes (Signed)
P- 77 Pt states she is having pressure in abdomen, Pt states there is a pulling pain in her abdomen. Pt states it comes and goes. Pt states she had oral surgery 09-04-13. Pt states she was only given 3 hydrocodone and was told to take tylenol after that. Pt states the tylenol is not giving her relief.

## 2013-09-10 ENCOUNTER — Other Ambulatory Visit: Payer: Self-pay | Admitting: *Deleted

## 2013-09-10 DIAGNOSIS — O219 Vomiting of pregnancy, unspecified: Secondary | ICD-10-CM

## 2013-09-10 MED ORDER — DOXYLAMINE-PYRIDOXINE 10-10 MG PO TBEC: 10.0000 mg | DELAYED_RELEASE_TABLET | Freq: Every day | ORAL | Status: DC

## 2013-09-19 ENCOUNTER — Encounter: Payer: 59 | Admitting: Obstetrics

## 2013-09-23 ENCOUNTER — Encounter: Payer: Medicaid Other | Admitting: Advanced Practice Midwife

## 2013-09-30 ENCOUNTER — Encounter: Payer: Self-pay | Admitting: Obstetrics

## 2013-09-30 ENCOUNTER — Ambulatory Visit (INDEPENDENT_AMBULATORY_CARE_PROVIDER_SITE_OTHER): Payer: 59 | Admitting: Obstetrics

## 2013-09-30 VITALS — BP 144/87 | Temp 97.9°F | Wt 215.0 lb

## 2013-09-30 DIAGNOSIS — Z3483 Encounter for supervision of other normal pregnancy, third trimester: Secondary | ICD-10-CM

## 2013-09-30 DIAGNOSIS — F4001 Agoraphobia with panic disorder: Secondary | ICD-10-CM

## 2013-09-30 DIAGNOSIS — O0990 Supervision of high risk pregnancy, unspecified, unspecified trimester: Secondary | ICD-10-CM

## 2013-09-30 DIAGNOSIS — Z3480 Encounter for supervision of other normal pregnancy, unspecified trimester: Secondary | ICD-10-CM

## 2013-09-30 LAB — POCT URINALYSIS DIPSTICK
Ketones, UA: NEGATIVE
Leukocytes, UA: NEGATIVE
Protein, UA: NEGATIVE
Spec Grav, UA: <=1.005
pH, UA: 7

## 2013-09-30 MED ORDER — LABETALOL HCL 200 MG PO TABS: 200.0000 mg | ORAL_TABLET | Freq: Two times a day (BID) | ORAL | Status: DC

## 2013-09-30 MED ORDER — ALPRAZOLAM 0.5 MG PO TABS: 0.5000 mg | ORAL_TABLET | Freq: Three times a day (TID) | ORAL | Status: DC | PRN

## 2013-09-30 NOTE — Progress Notes (Signed)
P 86 Patient reports she is having extreme pains- some nerve pain and some cramping pains. Patient states she is nervous at getting close to the weeks that she delivered before- 4 children born at or around 33 weeks. Patient states she is having increased depression also- and she is anxious.

## 2013-10-02 ENCOUNTER — Encounter (HOSPITAL_COMMUNITY): Payer: Self-pay | Admitting: Obstetrics

## 2013-10-07 ENCOUNTER — Encounter: Payer: 59 | Admitting: Obstetrics

## 2013-10-07 ENCOUNTER — Inpatient Hospital Stay (HOSPITAL_COMMUNITY)
Admission: AD | Admit: 2013-10-07 | Discharge: 2013-10-07 | Disposition: A | Discharge status: Home / Self Care | Payer: Medicaid Other | Source: Ambulatory Visit | Attending: Obstetrics & Gynecology | Admitting: Obstetrics & Gynecology

## 2013-10-07 ENCOUNTER — Encounter (HOSPITAL_COMMUNITY): Payer: Self-pay | Admitting: *Deleted

## 2013-10-07 DIAGNOSIS — O479 False labor, unspecified: Secondary | ICD-10-CM

## 2013-10-07 DIAGNOSIS — Y92009 Unspecified place in unspecified non-institutional (private) residence as the place of occurrence of the external cause: Secondary | ICD-10-CM | POA: Insufficient documentation

## 2013-10-07 DIAGNOSIS — W1809XA Striking against other object with subsequent fall, initial encounter: Secondary | ICD-10-CM | POA: Insufficient documentation

## 2013-10-07 DIAGNOSIS — W19XXXA Unspecified fall, initial encounter: Secondary | ICD-10-CM

## 2013-10-07 DIAGNOSIS — O4700 False labor before 37 completed weeks of gestation, unspecified trimester: Secondary | ICD-10-CM | POA: Insufficient documentation

## 2013-10-07 DIAGNOSIS — O99891 Other specified diseases and conditions complicating pregnancy: Secondary | ICD-10-CM | POA: Insufficient documentation

## 2013-10-07 LAB — FETAL FIBRONECTIN: Fetal Fibronectin: NEGATIVE

## 2013-10-07 MED ORDER — NIFEDIPINE 10 MG PO CAPS: 10.0000 mg | ORAL_CAPSULE | Freq: Once | ORAL | Status: DC

## 2013-10-07 NOTE — MAU Provider Note (Signed)
History     CSN: [id number]  Arrival date and time: 10/07/13 1210   None     Chief Complaint  Patient presents with  . Fall   HPI This is a 32 y.o. female at [redacted]w[redacted]d who presents with c/o falling today, earlier. States she hit her head and landed on her legs on her side. Did not strike abdomen. States felt dizzy and that is why she fell.  Has not had anything to eat all day.  Has had one small glass of juice only.  Denies neurological symptoms, no LOC or focal weakness.  Baby moving. Denies pain or bleeding, but does feel crampy with contractions.  RN Note: Pt presents with complaints of falling today while getting into her vehicle. She states that she fell onto her left side and hit the right side of her head on the vehicle beside her. She states that she did not hit her abdomen to her knowledge. She answers all questions appropriately. Denies any vaginal bleeding or leakage of fluid and states that her baby is active      OB History   Grav Para Term Preterm Abortions TAB SAB Ect Mult Living   7 5 5  0 1  1   5       Past Medical History  Diagnosis Date  . Hypertension   . Anxiety and depression   . Hx of preeclampsia, prior pregnancy, currently pregnant     prior pregnancy    Past Surgical History  Procedure Laterality Date  . No past surgeries      Family History  Problem Relation Age of Onset  . Hypertension Other   . Cancer Other     History  Substance Use Topics  . Smoking status: Former Smoker -- 0.50 packs/day    Types: Cigarettes    Quit date: 07/27/2013  . Smokeless tobacco: Never Used  . Alcohol Use: No    Allergies: No Known Allergies  Prescriptions prior to admission  Medication Sig Dispense Refill  . ALPRAZolam (XANAX) 0.5 MG tablet Take 1 tablet (0.5 mg total) by mouth 3 (three) times daily as needed for sleep or anxiety.  90 tablet  2  . labetalol (NORMODYNE) 200 MG tablet Take 1 tablet (200 mg total) by mouth 2 (two) times daily.  60 tablet   3    Review of Systems  Constitutional: Negative for fever and malaise/fatigue.  HENT:       No neck pain  Eyes: Negative for blurred vision and double vision.  Cardiovascular: Negative for chest pain.  Gastrointestinal: Positive for abdominal pain (cramps iwth contractions). Negative for nausea and vomiting.  Genitourinary: Negative for dysuria.  Musculoskeletal: Positive for falls. Negative for back pain, joint pain, myalgias and neck pain.  Neurological: Negative for dizziness, weakness and headaches.   Physical Exam   Blood pressure 133/74, pulse 89, temperature 97.8 F (36.6 C), temperature source Oral, resp. rate 18, last menstrual period 02/28/2013, SpO2 98.00%.  Filed Vitals:   10/07/13 1353 10/07/13 1354 10/07/13 1356 10/07/13 1517  BP: 126/90 136/90 127/91 134/74  Pulse: 101 85 89 92  Temp:    97.8 F (36.6 C)  TempSrc:    Oral  Resp: 20 20 20 18   SpO2:        Physical Exam  Constitutional: She is oriented to person, place, and time. She appears well-developed and well-nourished. No distress.  HENT:  Head: Normocephalic and atraumatic.  Eyes: Pupils are equal, round, and reactive to light.  Neck: Normal range of motion. Neck supple.  Cardiovascular: Normal rate and regular rhythm.   Respiratory: Effort normal and breath sounds normal. No respiratory distress. She exhibits no tenderness.  GI: Soft. There is no tenderness. There is no rebound and no guarding.  Genitourinary: Vagina normal and uterus normal. No vaginal discharge found.   Cervix long and closed   Musculoskeletal: Normal range of motion.  Neurological: She is alert and oriented to person, place, and time.  Skin: Skin is warm and dry.  Psychiatric: She has a normal mood and affect.   Results for orders placed during the hospital encounter of 10/07/13 (from the past 72 hour(s))  FETAL FIBRONECTIN     Status: None   Collection Time    10/07/13  1:46 PM      Result Value Range   Fetal Fibronectin  NEGATIVE  NEGATIVE  GLUCOSE, CAPILLARY     Status: None   Collection Time    10/07/13  2:32 PM      Result Value Range   Glucose-Capillary 76  70 - 99 mg/dL    Fetal heart rate reactive Irregular contractions every 2-5 minutes  MAU Course  Procedures  MDM Discussed with Dr Tamela Oddi. Will test for FFn and give some Procardia for irregular contractions.   Refuses Procardia, does not want to take any more meds and states does not feel UCs anymore Has been monitored for several hours  Assessment and Plan  A:  SIUP at [redacted]w[redacted]d       S/P fall earlier today      No evidence of head or neck trauma      No evidence of abruption      No food intake all day, normal glucose now with normal orthostatic VS  P:  Discharge home       PTL/abruption precautions      Followup in office        [facility] 10/07/2013, 1:28 PM

## 2013-10-07 NOTE — MAU Note (Signed)
Pt presents with complaints of falling today while getting into her vehicle. She states that she fell onto her left side and hit the right side of her head on the vehicle beside her. She states that she did not hit her abdomen to her knowledge. She answers all questions appropriately. Denies any vaginal bleeding or leakage of fluid and states that her baby is active

## 2013-10-10 ENCOUNTER — Other Ambulatory Visit: Payer: Self-pay | Admitting: Obstetrics

## 2013-10-10 DIAGNOSIS — O09899 Supervision of other high risk pregnancies, unspecified trimester: Secondary | ICD-10-CM

## 2013-10-11 ENCOUNTER — Ambulatory Visit (HOSPITAL_COMMUNITY): Admission: RE | Admit: 2013-10-11 | Payer: Medicaid Other | Source: Ambulatory Visit

## 2013-10-11 ENCOUNTER — Ambulatory Visit (HOSPITAL_COMMUNITY): Payer: Medicaid Other | Attending: Obstetrics

## 2013-10-15 ENCOUNTER — Encounter: Payer: 59 | Admitting: Obstetrics

## 2013-10-31 ENCOUNTER — Other Ambulatory Visit: Payer: Self-pay

## 2013-11-06 ENCOUNTER — Inpatient Hospital Stay (HOSPITAL_COMMUNITY)
Admission: AD | Admit: 2013-11-06 | Discharge: 2013-11-06 | Disposition: A | Discharge status: Home / Self Care | Payer: Medicaid Other | Source: Ambulatory Visit | Attending: Obstetrics | Admitting: Obstetrics

## 2013-11-06 ENCOUNTER — Encounter (HOSPITAL_COMMUNITY): Payer: Self-pay | Admitting: *Deleted

## 2013-11-06 ENCOUNTER — Encounter: Payer: Self-pay | Admitting: Obstetrics

## 2013-11-06 ENCOUNTER — Inpatient Hospital Stay (EMERGENCY_DEPARTMENT_HOSPITAL)
Admission: AD | Admit: 2013-11-06 | Discharge: 2013-11-06 | Disposition: A | Discharge status: Home / Self Care | Payer: Medicaid Other | Source: Ambulatory Visit | Attending: Obstetrics & Gynecology | Admitting: Obstetrics & Gynecology

## 2013-11-06 ENCOUNTER — Ambulatory Visit (INDEPENDENT_AMBULATORY_CARE_PROVIDER_SITE_OTHER): Payer: 59 | Admitting: Obstetrics

## 2013-11-06 DIAGNOSIS — O4700 False labor before 37 completed weeks of gestation, unspecified trimester: Secondary | ICD-10-CM | POA: Insufficient documentation

## 2013-11-06 DIAGNOSIS — O479 False labor, unspecified: Secondary | ICD-10-CM

## 2013-11-06 DIAGNOSIS — Z3483 Encounter for supervision of other normal pregnancy, third trimester: Secondary | ICD-10-CM

## 2013-11-06 DIAGNOSIS — Z3480 Encounter for supervision of other normal pregnancy, unspecified trimester: Secondary | ICD-10-CM

## 2013-11-06 LAB — URINALYSIS, ROUTINE W REFLEX MICROSCOPIC
Bilirubin Urine: NEGATIVE
Ketones, ur: NEGATIVE mg/dL
Leukocytes, UA: NEGATIVE
Nitrite: NEGATIVE
Urobilinogen, UA: 0.2 mg/dL (ref 0.0–1.0)
pH: 6 (ref 5.0–8.0)

## 2013-11-06 NOTE — MAU Note (Signed)
Pt having contractions, denies leaking or bleeding.

## 2013-11-06 NOTE — MAU Note (Signed)
Dr. Clearance Coots notified of pt, orders rec'd.

## 2013-11-06 NOTE — MAU Provider Note (Signed)
History     CSN: [id number]  Arrival date and time: 11/06/13 1541   First Provider Initiated Contact with Patient 11/06/13 1640      Chief Complaint  Patient presents with  . Labor Eval   HPI  Pt is a 32 yo G7P5015 at [redacted]w[redacted]d weeks IUP here with report of contractions.  Reports contractions started last night that became "unbearable" after leaving MAU this morning.  Sent home due to cervix being 1cm.  No report of vaginal bleeding or leaking of fluid.  Sent here from office for evaluation.    Past Medical History  Diagnosis Date  . Hypertension   . Anxiety and depression   . Hx of preeclampsia, prior pregnancy, currently pregnant     prior pregnancy    Past Surgical History  Procedure Laterality Date  . No past surgeries      Family History  Problem Relation Age of Onset  . Hypertension Other   . Cancer Other     History  Substance Use Topics  . Smoking status: Former Smoker -- 0.50 packs/day    Types: Cigarettes    Quit date: 07/27/2013  . Smokeless tobacco: Never Used  . Alcohol Use: No    Allergies: No Known Allergies  Prescriptions prior to admission  Medication Sig Dispense Refill  . ALPRAZolam (XANAX) 0.5 MG tablet Take 1 tablet (0.5 mg total) by mouth 3 (three) times daily as needed for sleep or anxiety.  90 tablet  2  . Doxylamine-Pyridoxine (DICLEGIS) 10-10 MG TBEC Take 1 tablet by mouth 2 (two) times daily as needed (for nausea/vomiting).      . labetalol (NORMODYNE) 200 MG tablet Take 1 tablet (200 mg total) by mouth 2 (two) times daily.  60 tablet  3  . omeprazole (PRILOSEC) 20 MG capsule Take 20 mg by mouth 3 (three) times daily.      . Prenatal Vit-Fe Fumarate-FA (MULTIVITAMIN-PRENATAL) 27-0.8 MG TABS tablet Take 1 tablet by mouth daily at 12 noon.        Review of Systems  Gastrointestinal: Positive for abdominal pain (contractions).  Genitourinary:       Pelvic pressure  All other systems reviewed and are negative.   Physical Exam    Blood pressure 119/72, pulse 92, temperature 98.6 F (37 C), temperature source Oral, resp. rate 16, last menstrual period 02/28/2013, SpO2 100.00%.  Physical Exam  Constitutional: She is oriented to person, place, and time. She appears well-developed and well-nourished. No distress.  HENT:  Head: Normocephalic.  Neck: Normal range of motion. Neck supple.  Cardiovascular: Normal rate, regular rhythm and normal heart sounds.   Respiratory: Effort normal and breath sounds normal.  GI: Soft. There is no tenderness.  Genitourinary: No bleeding around the vagina. Vaginal discharge (mucusy) found.  Musculoskeletal: Normal range of motion. She exhibits edema (trace).  Neurological: She is alert and oriented to person, place, and time.  Skin: Skin is warm and dry.  Dilation: 2 Effacement (%): Thick Station: Ballotable Presentation: Vertex Exam by:: G Morris RN   FHR 130's, +accels, reactive Toco - 3 - 4.5  MAU Course  Procedures  Results for orders placed during the hospital encounter of 11/06/13 (from the past 24 hour(s))  URINALYSIS, ROUTINE W REFLEX MICROSCOPIC     Status: None   Collection Time    11/06/13  4:00 PM      Result Value Range   Color, Urine YELLOW  YELLOW   APPearance CLEAR  CLEAR   Specific Gravity,  Urine 1.020  1.005 - 1.030   pH 6.0  5.0 - 8.0   Glucose, UA NEGATIVE  NEGATIVE mg/dL   Hgb urine dipstick NEGATIVE  NEGATIVE   Bilirubin Urine NEGATIVE  NEGATIVE   Ketones, ur NEGATIVE  NEGATIVE mg/dL   Protein, ur NEGATIVE  NEGATIVE mg/dL   Urobilinogen, UA 0.2  0.0 - 1.0 mg/dL   Nitrite NEGATIVE  NEGATIVE   Leukocytes, UA NEGATIVE  NEGATIVE    Cervix recheck: (no change) Dilation: 2 Effacement (%): Thick Cervical Position: Posterior Station: Ballotable Presentation: Vertex Exam by:: Jolaine Artist, RN  [phone] Consulted with Dr. Tamela Oddi > reviewed the HPI/exam/OB history/cervical exams > discharge home with review of signs of labor Assessment and Plan   32 yo G7P5015 at [redacted]w[redacted]d wks IUP Preterm Contractions - unchanged cervix Category I FHR Tracing  Plan: Discharge to home Reviewed preterm labor precautions Keep scheduled appointment in office  [facility] 11/06/2013, 4:42 PM

## 2013-11-06 NOTE — Progress Notes (Signed)
Roney Marion, CNM discussed patient concerns, pain, D/C instructions at length. Many questions answered. Roney Marion, CNM called Dr. Tamela Oddi with report and then called Dr. Clearance Coots per patient request. Discussed comfort level and pain management.

## 2013-11-06 NOTE — MAU Note (Signed)
Patient states she is having contractions every 5 minutes. Was seen in the office but did not have a cervical exam. Denies bleeding or leaking. Reports good fetal movement.

## 2013-11-17 ENCOUNTER — Inpatient Hospital Stay (HOSPITAL_COMMUNITY)
Admission: AD | Admit: 2013-11-17 | Discharge: 2013-11-17 | Disposition: A | Discharge status: Home / Self Care | Payer: Medicaid Other | Source: Ambulatory Visit | Attending: Obstetrics | Admitting: Obstetrics

## 2013-11-17 DIAGNOSIS — O479 False labor, unspecified: Secondary | ICD-10-CM | POA: Insufficient documentation

## 2013-11-17 NOTE — MAU Note (Signed)
Spoke with Dr Tamela Oddi about patient. Reviewed with doctor reactive and reassuring fetal heart, uterine contractions, cervical exam and patient's vital signs. Patient to be discharged home at this time, encouraged to make follow-up appointment with office. Reviewed discharge instructions with patient, patient confirms understanding.

## 2013-11-17 NOTE — MAU Note (Signed)
Pt presents with complaint of contractions. Denies bleeding or ROM 

## 2013-11-19 ENCOUNTER — Encounter (HOSPITAL_COMMUNITY): Payer: Self-pay | Admitting: *Deleted

## 2013-11-19 ENCOUNTER — Inpatient Hospital Stay (HOSPITAL_COMMUNITY)
Admission: AD | Admit: 2013-11-19 | Discharge: 2013-11-25 | DRG: 775 | Disposition: A | Discharge status: Home / Self Care | Payer: Medicaid Other | Source: Ambulatory Visit | Attending: Obstetrics | Admitting: Obstetrics

## 2013-11-19 DIAGNOSIS — O09299 Supervision of pregnancy with other poor reproductive or obstetric history, unspecified trimester: Secondary | ICD-10-CM | POA: Diagnosis present

## 2013-11-19 DIAGNOSIS — Z8759 Personal history of other complications of pregnancy, childbirth and the puerperium: Secondary | ICD-10-CM | POA: Diagnosis present

## 2013-11-19 DIAGNOSIS — Z3490 Encounter for supervision of normal pregnancy, unspecified, unspecified trimester: Secondary | ICD-10-CM

## 2013-11-19 DIAGNOSIS — O139 Gestational [pregnancy-induced] hypertension without significant proteinuria, unspecified trimester: Principal | ICD-10-CM | POA: Diagnosis present

## 2013-11-19 LAB — CBC
HCT: 25.6 % — ABNORMAL LOW (ref 36.0–46.0)
Hemoglobin: 9.3 g/dL — ABNORMAL LOW (ref 12.0–15.0)
Hemoglobin: 8.8 g/dL — ABNORMAL LOW (ref 12.0–15.0)
MCH: 31.8 pg (ref 26.0–34.0)
MCHC: 34.8 g/dL (ref 30.0–36.0)
MCHC: 34.4 g/dL (ref 30.0–36.0)
MCV: 91.4 fL (ref 78.0–100.0)
MCV: 92.1 fL (ref 78.0–100.0)
Platelets: 212 10*3/uL (ref 150–400)
RBC: 2.92 MIL/uL — ABNORMAL LOW (ref 3.87–5.11)
RBC: 2.78 MIL/uL — ABNORMAL LOW (ref 3.87–5.11)
RDW: 13.4 % (ref 11.5–15.5)
RDW: 13.4 % (ref 11.5–15.5)

## 2013-11-19 LAB — TYPE AND SCREEN
ABO/RH(D): O POS
Antibody Screen: NEGATIVE

## 2013-11-19 LAB — URIC ACID: Uric Acid, Serum: 5.2 mg/dL (ref 2.4–7.0)

## 2013-11-19 LAB — COMPREHENSIVE METABOLIC PANEL
ALT: 7 U/L (ref 0–35)
Albumin: 3 g/dL — ABNORMAL LOW (ref 3.5–5.2)
Alkaline Phosphatase: 78 U/L (ref 39–117)
BUN: 4 mg/dL — ABNORMAL LOW (ref 6–23)
Calcium: 8.7 mg/dL (ref 8.4–10.5)
Creatinine, Ser: 0.42 mg/dL — ABNORMAL LOW (ref 0.50–1.10)
GFR calc Af Amer: >90 mL/min (ref >90)
Glucose, Bld: 74 mg/dL (ref 70–99)
Potassium: 3.2 mEq/L — ABNORMAL LOW (ref 3.5–5.1)
Sodium: 137 mEq/L (ref 135–145)
Total Protein: 6.2 g/dL (ref 6.0–8.3)

## 2013-11-19 LAB — ABO/RH: ABO/RH(D): O POS

## 2013-11-19 LAB — PROTEIN / CREATININE RATIO, URINE
Creatinine, Urine: 88.44 mg/dL
Protein Creatinine Ratio: 0.18 — ABNORMAL HIGH (ref 0.00–0.15)
Total Protein, Urine: 15.7 mg/dL

## 2013-11-19 MED ORDER — FLEET ENEMA 7-19 GM/118ML RE ENEM: 1.0000 | ENEMA | RECTAL | Status: DC | PRN

## 2013-11-19 MED ORDER — ALPRAZOLAM 0.5 MG PO TABS
0.5000 mg | ORAL_TABLET | Freq: Three times a day (TID) | ORAL | Status: DC | PRN
  Administered 2013-11-19 – 2013-11-20 (×2): 0.5 mg via ORAL
  Filled 2013-11-19 (×2): qty 1

## 2013-11-19 MED ORDER — PROMETHAZINE HCL 25 MG/ML IJ SOLN
25.0000 mg | Freq: Four times a day (QID) | INTRAMUSCULAR | Status: DC | PRN
  Administered 2013-11-19 – 2013-11-20 (×2): 25 mg via INTRAMUSCULAR
  Filled 2013-11-19 (×2): qty 1

## 2013-11-19 MED ORDER — PENICILLIN G POTASSIUM 5000000 UNITS IJ SOLR
2.5000 10*6.[IU] | INTRAVENOUS | Status: DC
  Administered 2013-11-20 (×3): 2.5 10*6.[IU] via INTRAVENOUS
  Filled 2013-11-19 (×9): qty 2.5

## 2013-11-19 MED ORDER — ZOLPIDEM TARTRATE 5 MG PO TABS: 5.0000 mg | ORAL_TABLET | Freq: Every evening | ORAL | Status: DC | PRN

## 2013-11-19 MED ORDER — LABETALOL HCL 200 MG PO TABS
200.0000 mg | ORAL_TABLET | Freq: Three times a day (TID) | ORAL | Status: DC
  Administered 2013-11-19 – 2013-11-20 (×4): 200 mg via ORAL
  Filled 2013-11-19 (×6): qty 1

## 2013-11-19 MED ORDER — LACTATED RINGERS IV SOLN: 500.0000 mL | INTRAVENOUS | Status: DC | PRN

## 2013-11-19 MED ORDER — LACTATED RINGERS IV SOLN
INTRAVENOUS | Status: DC
  Administered 2013-11-19 – 2013-11-20 (×2): via INTRAVENOUS

## 2013-11-19 MED ORDER — CITRIC ACID-SODIUM CITRATE 334-500 MG/5ML PO SOLN: 30.0000 mL | ORAL | Status: DC | PRN

## 2013-11-19 MED ORDER — LACTATED RINGERS IV SOLN
Freq: Once | INTRAVENOUS | Status: AC
  Administered 2013-11-19: 13:00:00 via INTRAVENOUS

## 2013-11-19 MED ORDER — ONDANSETRON HCL 4 MG/2ML IJ SOLN: 4.0000 mg | Freq: Four times a day (QID) | INTRAMUSCULAR | Status: DC | PRN

## 2013-11-19 MED ORDER — OXYTOCIN 40 UNITS IN LACTATED RINGERS INFUSION - SIMPLE MED
1.0000 m[IU]/min | INTRAVENOUS | Status: DC
  Administered 2013-11-19: 4 m[IU]/min via INTRAVENOUS
  Administered 2013-11-19: 1 m[IU]/min via INTRAVENOUS
  Filled 2013-11-19: qty 1000

## 2013-11-19 MED ORDER — NALBUPHINE HCL 10 MG/ML IJ SOLN
10.0000 mg | INTRAMUSCULAR | Status: DC | PRN
  Filled 2013-11-19: qty 1

## 2013-11-19 MED ORDER — OXYTOCIN 40 UNITS IN LACTATED RINGERS INFUSION - SIMPLE MED: 62.5000 mL/h | INTRAVENOUS | Status: DC

## 2013-11-19 MED ORDER — OXYCODONE-ACETAMINOPHEN 5-325 MG PO TABS
1.0000 | ORAL_TABLET | ORAL | Status: DC | PRN
  Administered 2013-11-20: 2 via ORAL
  Filled 2013-11-19: qty 2

## 2013-11-19 MED ORDER — NALBUPHINE SYRINGE 5 MG/0.5 ML
10.0000 mg | INJECTION | INTRAMUSCULAR | Status: DC | PRN
  Administered 2013-11-19 – 2013-11-20 (×2): 10 mg via INTRAMUSCULAR
  Filled 2013-11-19 (×3): qty 1

## 2013-11-19 MED ORDER — TERBUTALINE SULFATE 1 MG/ML IJ SOLN: 0.2500 mg | Freq: Once | INTRAMUSCULAR | Status: AC | PRN

## 2013-11-19 MED ORDER — PENICILLIN G POTASSIUM 5000000 UNITS IJ SOLR
5.0000 10*6.[IU] | Freq: Once | INTRAVENOUS | Status: AC
  Administered 2013-11-19: 5 10*6.[IU] via INTRAVENOUS
  Filled 2013-11-19: qty 5

## 2013-11-19 MED ORDER — OXYTOCIN BOLUS FROM INFUSION: 500.0000 mL | INTRAVENOUS | Status: DC

## 2013-11-19 MED ORDER — LIDOCAINE HCL (PF) 1 % IJ SOLN
30.0000 mL | INTRAMUSCULAR | Status: DC | PRN
  Filled 2013-11-19 (×2): qty 30

## 2013-11-19 MED ORDER — IBUPROFEN 600 MG PO TABS
600.0000 mg | ORAL_TABLET | Freq: Four times a day (QID) | ORAL | Status: DC | PRN
  Administered 2013-11-20: 600 mg via ORAL
  Filled 2013-11-19: qty 1

## 2013-11-19 MED ORDER — NALBUPHINE SYRINGE 5 MG/0.5 ML
10.0000 mg | INJECTION | INTRAMUSCULAR | Status: DC | PRN
  Administered 2013-11-19 – 2013-11-20 (×2): 10 mg via INTRAVENOUS
  Filled 2013-11-19 (×3): qty 1

## 2013-11-19 MED ORDER — LACTATED RINGERS IV SOLN: INTRAVENOUS | Status: DC

## 2013-11-19 MED ORDER — ACETAMINOPHEN 325 MG PO TABS
650.0000 mg | ORAL_TABLET | ORAL | Status: DC | PRN
  Administered 2013-11-19 – 2013-11-20 (×2): 650 mg via ORAL
  Filled 2013-11-19 (×2): qty 2

## 2013-11-19 NOTE — Progress Notes (Signed)
Dr. Clearance Coots notified of patient her complains of having painful contractions every 2 minutes and bloody show, tracing, contraction pattern, cervical exam (a change from 2 days ago documentation), BP readings. Order to obtain CBC, CMET, LDH, URIC ACID and urine protein/creatinine ration. Have advanced practice provider evaluate patient.

## 2013-11-19 NOTE — Progress Notes (Signed)
Left a message, awaiting a call back

## 2013-11-19 NOTE — MAU Note (Signed)
Patient is brought in by ems with c/o ctx q90mins and bloody show. She is on labetalol for elevated bp. Patient states that she took labetalol 200mg  at 0300am because she have been nauseaous (she takes her meds when she is less nauseated). She reports good fetal movement. Denies LOF, headache, visual disturbance, epigastric pain, no edema.

## 2013-11-19 NOTE — Progress Notes (Signed)
Dr Clearance Coots called back and he is notified of patient's current cervical exam. bp reading, results. Order to admit to antenatal unit with orders

## 2013-11-19 NOTE — H&P (Signed)
Paula Dominguez is a 32 y.o. female presenting for UC's. Maternal Medical History:  Reason for admission: Contractions.  32 yo G7 P5.  EDC 12-05-13.  Presents with UC's.  Prenatal care significant for Hypertension, on Labetalol.   Contractions: Onset was 6-12 hours ago.    Fetal activity: Perceived fetal activity is normal.   Last perceived fetal movement was within the past hour.    Prenatal complications: PIH.   Prenatal Complications - Diabetes: none.    OB History   Grav Para Term Preterm Abortions TAB SAB Ect Mult Living   7 5 5  0 1  1   5      Past Medical History  Diagnosis Date  . Hypertension   . Anxiety and depression   . Hx of preeclampsia, prior pregnancy, currently pregnant     prior pregnancy   Past Surgical History  Procedure Laterality Date  . No past surgeries     Family History: family history includes Cancer in her other; Hypertension in her other. Social History:  reports that she quit smoking about 3 months ago. Her smoking use included Cigarettes. She has a 17 pack-year smoking history. She has never used smokeless tobacco. She reports that she does not drink alcohol or use illicit drugs.   Prenatal Transfer Tool  Maternal Diabetes: No Genetic Screening: Normal Maternal Ultrasounds/Referrals: Normal Fetal Ultrasounds or other Referrals:  None Maternal Substance Abuse:  No Significant Maternal Medications:  Meds include: Other:  Labetalol, Xanax, Omeprazole Significant Maternal Lab Results:  Lab values include: Other:  GBS status unknown Other Comments:  None  Review of Systems  All other systems reviewed and are negative.    Dilation: 4.5 Effacement (%): 50 Station: -2 Exam by:: L. Lima, RN Blood pressure 143/91, pulse 85, temperature 98 F (36.7 C), temperature source Oral, resp. rate 20, height 5\' 6"  (1.676 m), weight 221 lb (100.245 kg), last menstrual period 02/28/2013, SpO2 97.00%. Maternal Exam:  Abdomen: Patient reports no  abdominal tenderness. Fetal presentation: vertex  Introitus: Normal vulva. Normal vagina.  Pelvis: adequate for delivery.   Cervix: Cervix evaluated by digital exam.     Physical Exam  Nursing note and vitals reviewed. Constitutional: She is oriented to person, place, and time. She appears well-developed and well-nourished.  HENT:  Head: Normocephalic and atraumatic.  Eyes: Conjunctivae are normal. Pupils are equal, round, and reactive to light.  Neck: Normal range of motion. Neck supple.  Cardiovascular: Normal rate and regular rhythm.   Respiratory: Effort normal.  GI: Soft.  Genitourinary: Vagina normal and uterus normal.  Musculoskeletal: Normal range of motion.  Neurological: She is alert and oriented to person, place, and time.  Skin: Skin is warm and dry.  Psychiatric: She has a normal mood and affect. Her behavior is normal. Judgment and thought content normal.    Prenatal labs: ABO, Rh: O/POS/-- (08/12 1646) Antibody: NEG (08/12 1646) Rubella: 1.17 (08/12 1646) RPR: NON REAC (08/12 1646)  HBsAg: NEGATIVE (08/12 1646)  HIV: NON REACTIVE (08/12 1646)  GBS:     Assessment/Plan: 37,5 weeks.  PIH.  Early labor.  Admit.     Keano Guggenheim A 11/19/2013, 3:21 PM

## 2013-11-19 NOTE — Plan of Care (Signed)
Problem: Consults Goal: Birthing Suites Patient Information Press F2 to bring up selections list   Pt 37-[redacted] weeks EGA     

## 2013-11-19 NOTE — Progress Notes (Signed)
Awaiting for a call back 

## 2013-11-20 ENCOUNTER — Inpatient Hospital Stay (HOSPITAL_COMMUNITY): Payer: Medicaid Other | Admitting: Anesthesiology

## 2013-11-20 ENCOUNTER — Encounter (HOSPITAL_COMMUNITY): Payer: Medicaid Other | Admitting: Anesthesiology

## 2013-11-20 ENCOUNTER — Encounter (HOSPITAL_COMMUNITY): Payer: Self-pay | Admitting: Anesthesiology

## 2013-11-20 LAB — CBC
HCT: 25.2 % — ABNORMAL LOW (ref 36.0–46.0)
Hemoglobin: 8.7 g/dL — ABNORMAL LOW (ref 12.0–15.0)
MCV: 92 fL (ref 78.0–100.0)
RBC: 2.74 MIL/uL — ABNORMAL LOW (ref 3.87–5.11)
RDW: 13.6 % (ref 11.5–15.5)
WBC: 10.8 10*3/uL — ABNORMAL HIGH (ref 4.0–10.5)

## 2013-11-20 MED ORDER — FENTANYL 2.5 MCG/ML BUPIVACAINE 1/10 % EPIDURAL INFUSION (WH - ANES)
INTRAMUSCULAR | Status: DC | PRN
  Administered 2013-11-20: 14 mL/h via EPIDURAL

## 2013-11-20 MED ORDER — PNEUMOCOCCAL VAC POLYVALENT 25 MCG/0.5ML IJ INJ
0.5000 mL | INJECTION | INTRAMUSCULAR | Status: DC
  Filled 2013-11-20: qty 0.5

## 2013-11-20 MED ORDER — ONDANSETRON HCL 4 MG PO TABS: 4.0000 mg | ORAL_TABLET | ORAL | Status: DC | PRN

## 2013-11-20 MED ORDER — OXYCODONE-ACETAMINOPHEN 5-325 MG PO TABS
1.0000 | ORAL_TABLET | ORAL | Status: DC | PRN
  Administered 2013-11-20: 2 via ORAL
  Administered 2013-11-20: 1 via ORAL
  Administered 2013-11-21 (×3): 2 via ORAL
  Filled 2013-11-20: qty 1
  Filled 2013-11-20 (×5): qty 2

## 2013-11-20 MED ORDER — TETANUS-DIPHTH-ACELL PERTUSSIS 5-2.5-18.5 LF-MCG/0.5 IM SUSP
0.5000 mL | Freq: Once | INTRAMUSCULAR | Status: AC
  Administered 2013-11-21: 0.5 mL via INTRAMUSCULAR
  Filled 2013-11-20: qty 0.5

## 2013-11-20 MED ORDER — SIMETHICONE 80 MG PO CHEW
80.0000 mg | CHEWABLE_TABLET | ORAL | Status: DC | PRN
  Administered 2013-11-20 – 2013-11-21 (×2): 80 mg via ORAL
  Filled 2013-11-20 (×2): qty 1

## 2013-11-20 MED ORDER — PHENYLEPHRINE 40 MCG/ML (10ML) SYRINGE FOR IV PUSH (FOR BLOOD PRESSURE SUPPORT)
80.0000 ug | PREFILLED_SYRINGE | INTRAVENOUS | Status: DC | PRN
  Filled 2013-11-20: qty 2
  Filled 2013-11-20: qty 10

## 2013-11-20 MED ORDER — WITCH HAZEL-GLYCERIN EX PADS: 1.0000 "application " | MEDICATED_PAD | CUTANEOUS | Status: DC | PRN

## 2013-11-20 MED ORDER — LACTATED RINGERS IV SOLN
500.0000 mL | Freq: Once | INTRAVENOUS | Status: AC
  Administered 2013-11-20: 500 mL via INTRAVENOUS

## 2013-11-20 MED ORDER — DIBUCAINE 1 % RE OINT: 1.0000 "application " | TOPICAL_OINTMENT | RECTAL | Status: DC | PRN

## 2013-11-20 MED ORDER — SENNOSIDES-DOCUSATE SODIUM 8.6-50 MG PO TABS
2.0000 | ORAL_TABLET | ORAL | Status: DC
  Administered 2013-11-21 – 2013-11-23 (×4): 2 via ORAL
  Filled 2013-11-20 (×4): qty 2

## 2013-11-20 MED ORDER — DIPHENHYDRAMINE HCL 50 MG/ML IJ SOLN: 12.5000 mg | INTRAMUSCULAR | Status: DC | PRN

## 2013-11-20 MED ORDER — PNEUMOCOCCAL VAC POLYVALENT 25 MCG/0.5ML IJ INJ
0.5000 mL | INJECTION | INTRAMUSCULAR | Status: AC
  Administered 2013-11-21: 0.5 mL via INTRAMUSCULAR
  Filled 2013-11-20: qty 0.5

## 2013-11-20 MED ORDER — INFLUENZA VAC SPLIT QUAD 0.5 ML IM SUSP
0.5000 mL | INTRAMUSCULAR | Status: AC
  Administered 2013-11-24: 0.5 mL via INTRAMUSCULAR
  Filled 2013-11-20: qty 0.5

## 2013-11-20 MED ORDER — PHENYLEPHRINE 40 MCG/ML (10ML) SYRINGE FOR IV PUSH (FOR BLOOD PRESSURE SUPPORT)
80.0000 ug | PREFILLED_SYRINGE | INTRAVENOUS | Status: DC | PRN
  Filled 2013-11-20: qty 2

## 2013-11-20 MED ORDER — MEDROXYPROGESTERONE ACETATE 150 MG/ML IM SUSP
150.0000 mg | INTRAMUSCULAR | Status: AC | PRN
  Administered 2013-11-25: 150 mg via INTRAMUSCULAR
  Filled 2013-11-20: qty 1

## 2013-11-20 MED ORDER — LANOLIN HYDROUS EX OINT: TOPICAL_OINTMENT | CUTANEOUS | Status: DC | PRN

## 2013-11-20 MED ORDER — ONDANSETRON HCL 4 MG/2ML IJ SOLN: 4.0000 mg | INTRAMUSCULAR | Status: DC | PRN

## 2013-11-20 MED ORDER — ALPRAZOLAM 0.5 MG PO TABS
0.5000 mg | ORAL_TABLET | Freq: Three times a day (TID) | ORAL | Status: DC | PRN
  Administered 2013-11-20 – 2013-11-21 (×2): 0.5 mg via ORAL
  Filled 2013-11-20 (×2): qty 1

## 2013-11-20 MED ORDER — ZOLPIDEM TARTRATE 5 MG PO TABS: 5.0000 mg | ORAL_TABLET | Freq: Every evening | ORAL | Status: DC | PRN

## 2013-11-20 MED ORDER — OXYTOCIN 40 UNITS IN LACTATED RINGERS INFUSION - SIMPLE MED: 62.5000 mL/h | INTRAVENOUS | Status: DC | PRN

## 2013-11-20 MED ORDER — PRENATAL MULTIVITAMIN CH
1.0000 | ORAL_TABLET | Freq: Every day | ORAL | Status: DC
  Administered 2013-11-21 – 2013-11-24 (×4): 1 via ORAL
  Filled 2013-11-20 (×4): qty 1

## 2013-11-20 MED ORDER — EPHEDRINE 5 MG/ML INJ
10.0000 mg | INTRAVENOUS | Status: DC | PRN
  Filled 2013-11-20: qty 2

## 2013-11-20 MED ORDER — LIDOCAINE HCL (PF) 1 % IJ SOLN
INTRAMUSCULAR | Status: DC | PRN
  Administered 2013-11-20 (×2): 4 mL

## 2013-11-20 MED ORDER — IBUPROFEN 600 MG PO TABS
600.0000 mg | ORAL_TABLET | Freq: Four times a day (QID) | ORAL | Status: DC
  Administered 2013-11-20 – 2013-11-25 (×19): 600 mg via ORAL
  Filled 2013-11-20 (×19): qty 1

## 2013-11-20 MED ORDER — DIPHENHYDRAMINE HCL 25 MG PO CAPS: 25.0000 mg | ORAL_CAPSULE | Freq: Four times a day (QID) | ORAL | Status: DC | PRN

## 2013-11-20 MED ORDER — EPHEDRINE 5 MG/ML INJ
10.0000 mg | INTRAVENOUS | Status: DC | PRN
  Filled 2013-11-20: qty 2
  Filled 2013-11-20: qty 4

## 2013-11-20 MED ORDER — BENZOCAINE-MENTHOL 20-0.5 % EX AERO
1.0000 "application " | INHALATION_SPRAY | CUTANEOUS | Status: DC | PRN
  Administered 2013-11-20 – 2013-11-22 (×2): 1 via TOPICAL
  Filled 2013-11-20 (×2): qty 56

## 2013-11-20 MED ORDER — FENTANYL 2.5 MCG/ML BUPIVACAINE 1/10 % EPIDURAL INFUSION (WH - ANES)
14.0000 mL/h | INTRAMUSCULAR | Status: DC | PRN
  Filled 2013-11-20: qty 125

## 2013-11-20 MED ORDER — INFLUENZA VAC SPLIT QUAD 0.5 ML IM SUSP: 0.5000 mL | INTRAMUSCULAR | Status: DC

## 2013-11-20 NOTE — Progress Notes (Signed)
Paula Dominguez is a 32 y.o. [id number] at [redacted]w[redacted]d by LMP admitted for contractions and elevated BP.  Subjective:   Objective: BP 118/65  Pulse 79  Temp(Src) 97.8 F (36.6 C) (Oral)  Resp 18  Ht 5\' 6"  (1.676 m)  Wt 221 lb (100.245 kg)  BMI 35.69 kg/m2  SpO2 97%  LMP 02/28/2013      FHT:  130-140bpm UC:   irregular, every 4-7 minutes SVE:   4-5cm / 50% / -2 / Vtx  Labs: Lab Results  Component Value Date   WBC 10.8* 11/20/2013   HGB 8.7* 11/20/2013   HCT 25.2* 11/20/2013   MCV 92.0 11/20/2013   PLT 166 11/20/2013    Assessment / Plan: 37.6 weeks.  PIH.  Admitted with UC's in early labor.  On low dose pitocin augmentation.  Increase pitocin.  Labor: Inadequate active phase. Preeclampsia:  no signs or symptoms of toxicity, intake and ouput balanced and labs stable Fetal Wellbeing:  Category I Pain Control:  Nubain I/D:  n/a Anticipated MOD:  NSVD  HARPER,CHARLES A 11/20/2013, 4:10 AM

## 2013-11-20 NOTE — Lactation Note (Signed)
This note was copied from the chart of BOY Anson Crofts. Lactation Consultation Note  Patient Name: BOY Colette Dicamillo [id number] Date: 11/20/2013 Reason for consult: Initial assessment;Other (Comment) (charting for exclusion)   Maternal Data Formula Feeding for Exclusion: Yes Reason for exclusion: Mother's choice to formula feed on admision  Feeding Feeding Type: Bottle Fed - Formula  LATCH Score/Interventions                      Lactation Tools Discussed/Used     Consult Status Consult Status: Complete    Lynda Rainwater 11/20/2013, 4:36 PM

## 2013-11-20 NOTE — Progress Notes (Signed)
UR completed 

## 2013-11-20 NOTE — Anesthesia Procedure Notes (Signed)
Epidural Patient location during procedure: OB Start time: 11/20/2013 4:16 AM  Staffing Anesthesiologist: Ernest Orr A. Performed by: anesthesiologist   Preanesthetic Checklist Completed: patient identified, site marked, surgical consent, pre-op evaluation, timeout performed, IV checked, risks and benefits discussed and monitors and equipment checked  Epidural Patient position: sitting Prep: site prepped and draped and DuraPrep Patient monitoring: continuous pulse ox and blood pressure Approach: midline Injection technique: LOR air  Needle:  Needle type: Tuohy  Needle gauge: 17 G Needle length: 9 cm and 9 Needle insertion depth: 7 cm Catheter type: closed end flexible Catheter size: 19 Gauge Catheter at skin depth: 12 cm Test dose: negative and Other  Assessment Events: blood not aspirated, injection not painful, no injection resistance, negative IV test and no paresthesia  Additional Notes Patient identified. Risks and benefits discussed including failed block, incomplete  Pain control, post dural puncture headache, nerve damage, paralysis, blood pressure Changes, nausea, vomiting, reactions to medications-both toxic and allergic and post Partum back pain. All questions were answered. Patient expressed understanding and wished to proceed. Sterile technique was used throughout procedure. Epidural site was Dressed with sterile barrier dressing. No paresthesias, signs of intravascular injection Or signs of intrathecal spread were encountered.  Patient was more comfortable after the epidural was dosed. Please see RN's note for documentation of vital signs and FHR which are stable.

## 2013-11-20 NOTE — Anesthesia Preprocedure Evaluation (Signed)
Anesthesia Evaluation  Patient identified by MRN, date of birth, ID band Patient awake    Reviewed: Allergy & Precautions, H&P , Patient's Chart, lab work & pertinent test results  Airway Mallampati: III TM Distance: >3 FB Neck ROM: Full    Dental no notable dental hx. (+) Teeth Intact   Pulmonary former smoker,  breath sounds clear to auscultation  Pulmonary exam normal       Cardiovascular hypertension, Pt. on medications and Pt. on home beta blockers Rhythm:Regular Rate:Normal     Neuro/Psych  Headaches, PSYCHIATRIC DISORDERS Depression    GI/Hepatic Neg liver ROS, GERD-  Medicated and Controlled,  Endo/Other  negative endocrine ROS  Renal/GU negative Renal ROS  negative genitourinary   Musculoskeletal negative musculoskeletal ROS (+)   Abdominal (+) + obese,   Peds  Hematology negative hematology ROS (+)   Anesthesia Other Findings   Reproductive/Obstetrics (+) Pregnancy Hx/o preeclampsia                           Anesthesia Physical Anesthesia Plan  ASA: II  Anesthesia Plan: Epidural   Post-op Pain Management:    Induction:   Airway Management Planned: Natural Airway  Additional Equipment:   Intra-op Plan:   Post-operative Plan:   Informed Consent: I have reviewed the patients History and Physical, chart, labs and discussed the procedure including the risks, benefits and alternatives for the proposed anesthesia with the patient or authorized representative who has indicated his/her understanding and acceptance.     Plan Discussed with: Anesthesiologist  Anesthesia Plan Comments:         Anesthesia Quick Evaluation

## 2013-11-21 LAB — CBC
HCT: 26.3 % — ABNORMAL LOW (ref 36.0–46.0)
Hemoglobin: 9 g/dL — ABNORMAL LOW (ref 12.0–15.0)
MCHC: 34.2 g/dL (ref 30.0–36.0)
MCV: 92.3 fL (ref 78.0–100.0)
RBC: 2.85 MIL/uL — ABNORMAL LOW (ref 3.87–5.11)
RDW: 13.7 % (ref 11.5–15.5)

## 2013-11-21 MED ORDER — MAGNESIUM SULFATE 40 G IN LACTATED RINGERS - SIMPLE
2.0000 g/h | INTRAVENOUS | Status: DC
  Administered 2013-11-22 – 2013-11-23 (×2): 2 g/h via INTRAVENOUS
  Filled 2013-11-21 (×3): qty 500

## 2013-11-21 MED ORDER — MAGNESIUM SULFATE BOLUS VIA INFUSION
4.0000 g | Freq: Once | INTRAVENOUS | Status: AC
  Administered 2013-11-22: 4 g via INTRAVENOUS
  Filled 2013-11-21: qty 500

## 2013-11-21 MED ORDER — MAGNESIUM HYDROXIDE 400 MG/5ML PO SUSP
30.0000 mL | Freq: Two times a day (BID) | ORAL | Status: DC
  Administered 2013-11-21 – 2013-11-25 (×6): 30 mL via ORAL
  Filled 2013-11-21 (×9): qty 30

## 2013-11-21 MED ORDER — AMLODIPINE BESYLATE 10 MG PO TABS
10.0000 mg | ORAL_TABLET | Freq: Every day | ORAL | Status: DC
  Filled 2013-11-21: qty 1

## 2013-11-21 MED ORDER — AMLODIPINE BESYLATE 10 MG PO TABS
10.0000 mg | ORAL_TABLET | Freq: Once | ORAL | Status: AC
  Administered 2013-11-21: 10 mg via ORAL
  Filled 2013-11-21: qty 1

## 2013-11-21 MED ORDER — MAGNESIUM SULFATE 40 G IN LACTATED RINGERS - SIMPLE
2.0000 g/h | INTRAVENOUS | Status: DC
  Filled 2013-11-21: qty 500

## 2013-11-21 MED ORDER — HYDROCHLOROTHIAZIDE 25 MG PO TABS
25.0000 mg | ORAL_TABLET | Freq: Every day | ORAL | Status: DC
  Administered 2013-11-21 – 2013-11-25 (×5): 25 mg via ORAL
  Filled 2013-11-21 (×5): qty 1

## 2013-11-21 MED ORDER — LABETALOL HCL 200 MG PO TABS
200.0000 mg | ORAL_TABLET | Freq: Once | ORAL | Status: AC
  Administered 2013-11-21: 200 mg via ORAL
  Filled 2013-11-21: qty 1

## 2013-11-21 MED ORDER — LABETALOL HCL 200 MG PO TABS
200.0000 mg | ORAL_TABLET | Freq: Three times a day (TID) | ORAL | Status: DC
  Administered 2013-11-21 – 2013-11-22 (×5): 200 mg via ORAL
  Filled 2013-11-21 (×7): qty 1

## 2013-11-21 MED ORDER — HYDROMORPHONE HCL 2 MG PO TABS
4.0000 mg | ORAL_TABLET | ORAL | Status: DC | PRN
  Administered 2013-11-21 – 2013-11-23 (×8): 4 mg via ORAL
  Filled 2013-11-21 (×8): qty 2

## 2013-11-21 MED ORDER — MAGNESIUM SULFATE 40 MG/ML IJ SOLN
4.0000 g | Freq: Once | INTRAMUSCULAR | Status: DC
  Filled 2013-11-21: qty 100

## 2013-11-21 MED ORDER — DOCUSATE SODIUM 100 MG PO CAPS
100.0000 mg | ORAL_CAPSULE | Freq: Two times a day (BID) | ORAL | Status: DC
  Administered 2013-11-21 – 2013-11-25 (×7): 100 mg via ORAL
  Filled 2013-11-21 (×8): qty 1

## 2013-11-21 MED ORDER — LORAZEPAM 1 MG PO TABS
1.0000 mg | ORAL_TABLET | Freq: Three times a day (TID) | ORAL | Status: DC
  Administered 2013-11-21 – 2013-11-25 (×13): 1 mg via ORAL
  Filled 2013-11-21 (×13): qty 1

## 2013-11-21 NOTE — Progress Notes (Signed)
Post Partum Day 1 Subjective: C/O headache  Objective: Blood pressure 171/115, pulse 87, temperature 98.2 F (36.8 C), temperature source Oral, resp. rate 18, height 5\' 6"  (1.676 m), weight 221 lb (100.245 kg), last menstrual period 02/28/2013, SpO2 96.00%, unknown if currently breastfeeding.  Physical Exam:  General: alert and no distress Lochia: appropriate Uterine Fundus: firm Incision: None DVT Evaluation: No evidence of DVT seen on physical exam.   Recent Labs  11/20/13 0315 11/21/13 0630  HGB 8.7* 9.0*  HCT 25.2* 26.3*    Assessment/Plan: Preeclampsia.  Will start magnesium sulfate.  Transfer to AICU.   LOS: 2 days   HARPER,CHARLES A 11/21/2013, 11:07 PM

## 2013-11-21 NOTE — Anesthesia Postprocedure Evaluation (Signed)
  Anesthesia Post-op Note  Patient: Paula Dominguez  Procedure(s) Performed: * No procedures listed *  Patient Location: Mother/Baby  Anesthesia Type:Epidural  Level of Consciousness: awake  Airway and Oxygen Therapy: Patient Spontanous Breathing  Post-op Pain: mild  Post-op Assessment: Patient's Cardiovascular Status Stable and Respiratory Function Stable  Post-op Vital Signs: stable  Complications: No apparent anesthesia complications

## 2013-11-21 NOTE — Progress Notes (Signed)
Pt BP 171/115 Complaining of HA . Dr Clearance Coots notified. Orders received.

## 2013-11-22 LAB — CBC
Hemoglobin: 9.2 g/dL — ABNORMAL LOW (ref 12.0–15.0)
MCH: 31.7 pg (ref 26.0–34.0)
MCV: 93.8 fL (ref 78.0–100.0)
RBC: 2.9 MIL/uL — ABNORMAL LOW (ref 3.87–5.11)
WBC: 9.8 10*3/uL (ref 4.0–10.5)

## 2013-11-22 LAB — COMPREHENSIVE METABOLIC PANEL
ALT: 6 U/L (ref 0–35)
AST: 14 U/L (ref 0–37)
CO2: 26 mEq/L (ref 19–32)
Calcium: 9 mg/dL (ref 8.4–10.5)
Chloride: 101 mEq/L (ref 96–112)
Creatinine, Ser: 0.55 mg/dL (ref 0.50–1.10)
GFR calc Af Amer: >90 mL/min (ref >90)
GFR calc non Af Amer: >90 mL/min (ref >90)
Glucose, Bld: 86 mg/dL (ref 70–99)
Sodium: 136 mEq/L (ref 135–145)
Total Bilirubin: 0.2 mg/dL — ABNORMAL LOW (ref 0.3–1.2)

## 2013-11-22 LAB — MRSA PCR SCREENING: MRSA by PCR: NEGATIVE

## 2013-11-22 MED ORDER — LACTATED RINGERS IV SOLN
INTRAVENOUS | Status: DC
  Administered 2013-11-22: 21:00:00 via INTRAVENOUS

## 2013-11-22 MED ORDER — LACTATED RINGERS IV SOLN
INTRAVENOUS | Status: DC
  Administered 2013-11-22 – 2013-11-24 (×2): via INTRAVENOUS

## 2013-11-22 NOTE — Progress Notes (Signed)
Post Partum Day 2 Subjective: no complaints, voiding, tolerating PO and + flatus  Objective: Blood pressure 140/89, pulse 66, temperature 98.1 F (36.7 C), temperature source Oral, resp. rate 20, height 5\' 6"  (1.676 m), weight 221 lb (100.245 kg), last menstrual period 02/28/2013, SpO2 97.00%, unknown if currently breastfeeding.  Physical Exam:  General: alert and no distress Lochia: appropriate Uterine Fundus: firm Incision: None DVT Evaluation: No evidence of DVT seen on physical exam.   Recent Labs  11/21/13 0630 11/22/13 0530  HGB 9.0* 9.2*  HCT 26.3* 27.2*    Assessment/Plan: Preeclampsia.  Stable.  Continue magnesium sulfate.   LOS: 3 days   Kazimir Hartnett A 11/22/2013, 8:56 AM

## 2013-11-22 NOTE — Progress Notes (Signed)
Lying positioned to far left side with BP cuff on dependent arm.

## 2013-11-23 MED ORDER — OXYCODONE-ACETAMINOPHEN 5-325 MG PO TABS
2.0000 | ORAL_TABLET | ORAL | Status: DC | PRN
  Administered 2013-11-23 – 2013-11-25 (×11): 2 via ORAL
  Filled 2013-11-23 (×12): qty 2

## 2013-11-23 MED ORDER — GUAIFENESIN 100 MG/5ML PO SYRP
200.0000 mg | ORAL_SOLUTION | ORAL | Status: DC | PRN
  Administered 2013-11-23: 200 mg via ORAL
  Filled 2013-11-23: qty 10

## 2013-11-23 MED ORDER — LABETALOL HCL 200 MG PO TABS
400.0000 mg | ORAL_TABLET | Freq: Three times a day (TID) | ORAL | Status: DC
  Administered 2013-11-23 – 2013-11-24 (×6): 400 mg via ORAL
  Filled 2013-11-23 (×8): qty 2

## 2013-11-23 MED ORDER — CLONIDINE HCL 0.2 MG PO TABS
0.2000 mg | ORAL_TABLET | Freq: Two times a day (BID) | ORAL | Status: DC
  Administered 2013-11-23 – 2013-11-25 (×4): 0.2 mg via ORAL
  Filled 2013-11-23 (×4): qty 1

## 2013-11-23 NOTE — Progress Notes (Signed)
11/23/13 1206  Vitals  BP ! 165/95 mmHg  MAP (mmHg) 112  pt just returned from bathroom. Husband here to visit-holding the baby. Pt worried he will fall asleep. Reassured pt and encouraged FOB to place baby in bassinet if he gets sleepy. FOB awake and alert at present.

## 2013-11-23 NOTE — Progress Notes (Signed)
11/23/13 1300  Vitals  BP ! 170/111 mmHg  MAP (mmHg) 123  Pulse Rate 72  Oxygen Therapy  SpO2 100 %  pt verbalizes increased anxiety (particularly since husband is back to visit). Encouraged pt to lie down to nap and decrease stimulus. Pt has her Bible and notes to provide spiritual support. Emotional support provided.

## 2013-11-23 NOTE — Progress Notes (Signed)
Clinical Social Work Department PSYCHOSOCIAL ASSESSMENT - MATERNAL/CHILD 11/23/2013  Patient:  Paula Dominguez, Paula Dominguez  Account Number:  [id number]  Admit Date:  11/19/2013  Marjo Bicker Name:   Neal Dy    Clinical Social Worker:  Courvoisier Hamblen, LCSW   Date/Time:  11/23/2013 11:00 AM  Date Referred:  11/22/2013   Referral source  Physician     Referred reason  Depression/Anxiety   Other referral source:    I:  FAMILY / HOME ENVIRONMENT Child's legal guardian:  PARENT  Guardian - Name Guardian - Age Guardian - Address  Saab,Ruhi [address]  [place], [place] [address]  Doree Barthel 36 same as above   Other household support members/support persons Other support:    II  PSYCHOSOCIAL DATA Information Source:  Patient Interview  Insurance claims handler Resources Employment:   Spouse is employed   Surveyor, quantity resources:  [employer] If [employer]:   Clinical biochemist  [employer]   School / Grade:   Maternity Care Coordinator / Child Services Coordination / Early Interventions:  Cultural issues impacting care:    III  STRENGTHS Strengths  Home prepared for Child (including basic supplies)  Supportive family/friends   Strength comment:    IV  RISK FACTORS AND CURRENT PROBLEMS Current Problem:       V  SOCIAL WORK ASSESSMENT Acknowledged order for Social Work consult to assess mother's history of anxiety and  depression. Met with mother who was pleasant and receptive to social work intervention.  She is married and have 5 other dependents ages 50, 66, 59, 8, and 2 from a previous relationship. Spouse is employed.   Mother states that she was treated for anxiety and depression while in [place] and for years declined medication, but finally agreed to Xanax.   She is not currently on any antidepressants, but would consider it if needed.       Mother denies any hx of psychiatric hospitalization.   She denies SI and reports being tearful at times, but states that she  has become educated about depression and anxiety over the years and has learned how to better manage her depression.  Mother states that she relies on her faith and the support of her family.   She notes worsening symptoms of depression during pregnancy and after.   She spoke of how difficult this pregnancy was and the need for her to return to the work force.    Allowed her to vent.  Provided supportive feedback and encouragement. Spoke with her regarding treatment options and she was open to medication.  Mother states that she would not be able to schedule therapy appointments because of her busy schedule.   Informed that she will seek professional treatment if needed in the future.      VI SOCIAL WORK PLAN  Type of pt/family education:   If child protective services report - county:   If child protective services report - date:   Information/referral to community resources comment:   Hospital's car seat program  PP Depression Resources   Other social work plan:   CSW will follow PRN.    Ray Glacken J, LCSW

## 2013-11-23 NOTE — Progress Notes (Signed)
11/23/13 1319  Vitals  BP ! 157/84 mmHg  MAP (mmHg) 101  Pulse Rate 65  Resp 20  scheduled BP med given.

## 2013-11-23 NOTE — Progress Notes (Signed)
11/23/13 1300  Vitals  BP ! 170/111 mmHg  MAP (mmHg) 123  Pulse Rate 72  Oxygen Therapy  SpO2 100 %  Dr. Clearance Coots notified. Will continue to monitor closely.

## 2013-11-23 NOTE — Progress Notes (Signed)
11/23/13 1705  Vitals  BP ! 163/106 mmHg  MAP (mmHg) 119  Pulse Rate 64  Resp 20  Oxygen Therapy  SpO2 97 %  MD notified-orders recieved

## 2013-11-23 NOTE — Progress Notes (Signed)
11/23/13 1500  Vitals  BP ! 174/92 mmHg  MAP (mmHg) 112  Pulse Rate 69  Oxygen Therapy  SpO2 96 %  Pt lying on L) side, on BP cuff. Pt is asleep, will recheck when she wakes up.

## 2013-11-23 NOTE — Progress Notes (Signed)
Post Partum Day 3 Subjective: C/O headache and cramping.  Objective: Blood pressure 158/93, pulse 64, temperature 97.9 F (36.6 C), temperature source Oral, resp. rate 20, height 5\' 6"  (1.676 m), weight 221 lb (100.245 kg), last menstrual period 02/28/2013, SpO2 95.00%, unknown if currently breastfeeding.  Physical Exam:  General: alert and no distress Lochia: appropriate Uterine Fundus: firm Incision: none DVT Evaluation: No evidence of DVT seen on physical exam.   Recent Labs  11/21/13 0630 11/22/13 0530  HGB 9.0* 9.2*  HCT 26.3* 27.2*    Assessment/Plan: Plan for discharge tomorrow   LOS: 4 days   HARPER,CHARLES A 11/23/2013, 6:12 AM

## 2013-11-23 NOTE — Progress Notes (Signed)
11/23/13 1109  Vitals  BP ! 154/100 mmHg  MAP (mmHg) 113  pt intermittently tearful;concerned about going home, increased BP's and the feelings of anxiety she is having. SW just left her room. Emotional support provided.

## 2013-11-24 MED ORDER — NICOTINE 14 MG/24HR TD PT24
14.0000 mg | MEDICATED_PATCH | Freq: Every day | TRANSDERMAL | Status: DC
  Administered 2013-11-24 – 2013-11-25 (×2): 14 mg via TRANSDERMAL
  Filled 2013-11-24 (×3): qty 1

## 2013-11-24 MED ORDER — AMLODIPINE BESYLATE 10 MG PO TABS
10.0000 mg | ORAL_TABLET | Freq: Every day | ORAL | Status: DC
  Administered 2013-11-24 – 2013-11-25 (×2): 10 mg via ORAL
  Filled 2013-11-24 (×2): qty 1

## 2013-11-24 NOTE — Progress Notes (Signed)
11/24/13 1012  Vitals  BP ! 154/109 mmHg  MAP (mmHg) 115  BP Location Left arm  BP Method Automatic  Patient Position, if appropriate Sitting  Pulse Rate 80  Resp 18  Oxygen Therapy  SpO2 98 %  O2 Device None (Room air)  Just started Norvasc 10mg  po and gave am BP meds.

## 2013-11-25 ENCOUNTER — Other Ambulatory Visit (HOSPITAL_COMMUNITY): Payer: Self-pay | Admitting: Obstetrics

## 2013-11-25 MED ORDER — IBUPROFEN 600 MG PO TABS: 600.0000 mg | ORAL_TABLET | Freq: Four times a day (QID) | ORAL | Status: DC

## 2013-11-25 MED ORDER — OXYCODONE-ACETAMINOPHEN 5-325 MG PO TABS: 2.0000 | ORAL_TABLET | ORAL | Status: DC | PRN

## 2013-11-25 MED ORDER — AMLODIPINE BESYLATE 10 MG PO TABS: 10.0000 mg | ORAL_TABLET | Freq: Every day | ORAL | Status: DC

## 2013-11-25 MED ORDER — LABETALOL HCL 200 MG PO TABS: 400.0000 mg | ORAL_TABLET | Freq: Two times a day (BID) | ORAL | Status: DC

## 2013-11-25 MED ORDER — HYDROCHLOROTHIAZIDE 25 MG PO TABS: 25.0000 mg | ORAL_TABLET | Freq: Every day | ORAL | Status: DC

## 2013-11-25 MED ORDER — CLONIDINE HCL 0.2 MG PO TABS: 0.2000 mg | ORAL_TABLET | Freq: Two times a day (BID) | ORAL | Status: DC

## 2013-11-25 NOTE — Discharge Summary (Signed)
Obstetric Discharge Summary Reason for Admission: onset of labor Prenatal Procedures: ultrasound Intrapartum Procedures: spontaneous vaginal delivery Postpartum Procedures: Magnesium sulfate and antihypertensive therapy. Complications-Operative and Postpartum: Preeclampsia Hemoglobin  Date Value Range Status  11/22/2013 9.2* 12.0 - 15.0 g/dL Final     HCT  Date Value Range Status  11/22/2013 27.2* 36.0 - 46.0 % Final    Physical Exam:  General: alert and no distress Lochia: appropriate Uterine Fundus: firm Incision: none DVT Evaluation: No evidence of DVT seen on physical exam.  Discharge Diagnoses: Term Pregnancy-delivered  Discharge Information: Date: 11/25/2013 Activity: pelvic rest Diet: routine Medications: PNV, Ibuprofen, Colace, Iron, Percocet and Labetalol, Clonidine, Norvasc, HCTZ. Condition: stable Instructions: refer to practice specific booklet Discharge to: home Follow-up Information   Follow up with Clever [name], MD. Schedule an appointment as soon as possible for Dominguez visit in 2 weeks.   Specialty:  Obstetrics and Gynecology   Contact information:   [address] [place] [address] [phone]       Newborn Data: Live born female  Birth Weight: 6 lb 7 oz (2920 g) APGAR: 9, 9  Home with mother.  Paula Dominguez 11/25/2013, 6:58 AM

## 2013-11-25 NOTE — Progress Notes (Signed)
Post Partum Day 5 Subjective: no complaints  Objective: Blood pressure 169/95, pulse 54, temperature 97.4 F (36.3 C), temperature source Oral, resp. rate 18, height 5\' 6"  (1.676 m), weight 221 lb (100.245 kg), last menstrual period 02/28/2013, SpO2 96.00%, unknown if currently breastfeeding.  Physical Exam:  General: alert and no distress Lochia: appropriate Uterine Fundus: firm Incision: none DVT Evaluation: No evidence of DVT seen on physical exam.  No results found for this basename: HGB, HCT,  in the last 72 hours  Assessment/Plan: Discharge home.  BP stable off magnesium sulfate.   LOS: 6 days   Eliyanah Elgersma A 11/25/2013, 6:42 AM

## 2013-12-05 ENCOUNTER — Inpatient Hospital Stay (HOSPITAL_COMMUNITY)
Admission: AD | Admit: 2013-12-05 | Discharge: 2013-12-05 | Disposition: A | Discharge status: Home / Self Care | Payer: Medicaid Other | Source: Ambulatory Visit | Attending: Obstetrics | Admitting: Obstetrics

## 2013-12-05 ENCOUNTER — Ambulatory Visit (INDEPENDENT_AMBULATORY_CARE_PROVIDER_SITE_OTHER): Payer: Medicaid Other | Admitting: Obstetrics

## 2013-12-05 ENCOUNTER — Encounter (HOSPITAL_COMMUNITY): Payer: Self-pay | Admitting: *Deleted

## 2013-12-05 VITALS — BP 158/118 | HR 77 | Temp 99.0°F | Wt 197.0 lb

## 2013-12-05 DIAGNOSIS — I10 Essential (primary) hypertension: Secondary | ICD-10-CM

## 2013-12-05 DIAGNOSIS — O135 Gestational [pregnancy-induced] hypertension without significant proteinuria, complicating the puerperium: Secondary | ICD-10-CM | POA: Insufficient documentation

## 2013-12-05 DIAGNOSIS — O99345 Other mental disorders complicating the puerperium: Secondary | ICD-10-CM | POA: Insufficient documentation

## 2013-12-05 DIAGNOSIS — F329 Major depressive disorder, single episode, unspecified: Secondary | ICD-10-CM | POA: Insufficient documentation

## 2013-12-05 DIAGNOSIS — F3289 Other specified depressive episodes: Secondary | ICD-10-CM | POA: Insufficient documentation

## 2013-12-05 DIAGNOSIS — R51 Headache: Secondary | ICD-10-CM | POA: Insufficient documentation

## 2013-12-05 DIAGNOSIS — R52 Pain, unspecified: Secondary | ICD-10-CM

## 2013-12-05 LAB — COMPREHENSIVE METABOLIC PANEL
ALT: 27 U/L (ref 0–35)
Alkaline Phosphatase: 57 U/L (ref 39–117)
BUN: 11 mg/dL (ref 6–23)
CO2: 23 mEq/L (ref 19–32)
Chloride: 102 mEq/L (ref 96–112)
Creatinine, Ser: 0.7 mg/dL (ref 0.50–1.10)
GFR calc Af Amer: >90 mL/min (ref >90)
GFR calc non Af Amer: >90 mL/min (ref >90)
Glucose, Bld: 88 mg/dL (ref 70–99)
Potassium: 4.1 mEq/L (ref 3.5–5.1)
Sodium: 136 mEq/L (ref 135–145)
Total Bilirubin: 0.2 mg/dL — ABNORMAL LOW (ref 0.3–1.2)

## 2013-12-05 LAB — CBC
HCT: 32.7 % — ABNORMAL LOW (ref 36.0–46.0)
Hemoglobin: 11.4 g/dL — ABNORMAL LOW (ref 12.0–15.0)
RBC: 3.61 MIL/uL — ABNORMAL LOW (ref 3.87–5.11)
WBC: 4.8 10*3/uL (ref 4.0–10.5)

## 2013-12-05 LAB — LACTATE DEHYDROGENASE: LDH: 261 U/L — ABNORMAL HIGH (ref 94–250)

## 2013-12-05 LAB — PROTEIN / CREATININE RATIO, URINE
Protein Creatinine Ratio: 0.1 (ref 0.00–0.15)
Total Protein, Urine: 48 mg/dL

## 2013-12-05 MED ORDER — SERTRALINE HCL 50 MG PO TABS: 50.0000 mg | ORAL_TABLET | Freq: Every day | ORAL | Status: DC

## 2013-12-05 MED ORDER — LACTATED RINGERS IV BOLUS (SEPSIS)
1000.0000 mL | Freq: Once | INTRAVENOUS | Status: AC
  Administered 2013-12-05: 1000 mL via INTRAVENOUS

## 2013-12-05 MED ORDER — MORPHINE SULFATE 10 MG/ML IJ SOLN
10.0000 mg | Freq: Once | INTRAMUSCULAR | Status: AC
  Administered 2013-12-05: 10 mg via INTRAMUSCULAR
  Filled 2013-12-05: qty 1

## 2013-12-05 MED ORDER — DEXAMETHASONE SODIUM PHOSPHATE 10 MG/ML IJ SOLN
10.0000 mg | Freq: Once | INTRAMUSCULAR | Status: AC
  Administered 2013-12-05: 10 mg via INTRAVENOUS
  Filled 2013-12-05: qty 1

## 2013-12-05 MED ORDER — METOCLOPRAMIDE HCL 5 MG/ML IJ SOLN
10.0000 mg | Freq: Once | INTRAMUSCULAR | Status: AC
  Administered 2013-12-05: 10 mg via INTRAVENOUS
  Filled 2013-12-05: qty 2

## 2013-12-05 MED ORDER — OLMESARTAN MEDOXOMIL-HCTZ 40-25 MG PO TABS: 1.0000 | ORAL_TABLET | Freq: Every day | ORAL | Status: DC

## 2013-12-05 MED ORDER — PROMETHAZINE HCL 25 MG/ML IJ SOLN
25.0000 mg | Freq: Once | INTRAMUSCULAR | Status: AC
  Administered 2013-12-05: 25 mg via INTRAMUSCULAR
  Filled 2013-12-05: qty 1

## 2013-12-05 MED ORDER — OXYCODONE HCL 10 MG PO TABS: 10.0000 mg | ORAL_TABLET | Freq: Four times a day (QID) | ORAL | Status: DC

## 2013-12-05 MED ORDER — DIPHENHYDRAMINE HCL 50 MG/ML IJ SOLN
25.0000 mg | Freq: Once | INTRAMUSCULAR | Status: AC
  Administered 2013-12-05: 25 mg via INTRAVENOUS
  Filled 2013-12-05: qty 1

## 2013-12-05 NOTE — MAU Note (Signed)
Feels terrible. Sent from office, PP PIH eval.  +HA,  Having visual changes- blurring and spots- ongoing problem since delivered (11/26), denies epigastric pain or swelling.  Baby is doing well, bottle feeding.

## 2013-12-05 NOTE — MAU Provider Note (Signed)
History     CSN: [id number]  Arrival date and time: 12/05/13 1104   First Provider Initiated Contact with Patient 12/05/13 1146      No chief complaint on file.  HPI Pt is here 2 weeks postpartum for PIH eval and headache.  Pt has had headache and has been taking Fioricet without relief.  Pt feels like it is a tension headache located on her forehead.  Pt is depressed and has been taking Xanax prescribed by Dr. Clearance Coots.  Pt had preeclampsia with her pregnancy and pt said she stayed in ICU 3 days after delivery.  Pt was seen in the office today with elevated BP and headache not relieved by Fioricet.  Pt's BP was 158/118 when pt arrived in MAU.  Pt has had some nausea, but has been eating. Office notes: Paula Dominguez is a 32 y.o. female who presents for a postpartum visit. She is 2 weeks postpartum following a spontaneous vaginal delivery. I have fully reviewed the prenatal and intrapartum course. The delivery was at 37 gestational weeks. Outcome: spontaneous vaginal delivery. Anesthesia: none. Postpartum course has been difficult. Pt states that she is having lots of pain. Pt is also battling with her depression and home life. Pt states that she has no energy and no appetite. Pt is still having increase in BP. Pt states that she is also having some blur in vision, Baby's course has been going well. Baby is feeding by bottle - Enfamil AR. Bleeding thick, heavy lochia and clots. Bowel function is normal. Pt states that she believes she has hemorids. Bladder function is normal. Patient is not sexually active. Contraception method is Depo-Provera injections. Postpartum depression screening: 24.   Past Medical History  Diagnosis Date  . Hypertension   . Anxiety and depression   . Hx of preeclampsia, prior pregnancy, currently pregnant     prior pregnancy    Past Surgical History  Procedure Laterality Date  . No past surgeries      Family History  Problem Relation Age of Onset  .  Hypertension Other   . Cancer Other     History  Substance Use Topics  . Smoking status: Former Smoker -- 1.00 packs/day for 17 years    Types: Cigarettes    Quit date: 07/27/2013  . Smokeless tobacco: Never Used  . Alcohol Use: No    Allergies: No Known Allergies  Prescriptions prior to admission  Medication Sig Dispense Refill  . ALPRAZolam (XANAX) 0.5 MG tablet Take 1 tablet (0.5 mg total) by mouth 3 (three) times daily as needed for sleep or anxiety.  90 tablet  2  . amLODipine (NORVASC) 10 MG tablet TAKE 1 TABLET BY MOUTH EVERY DAY  90 tablet  0  . butalbital-acetaminophen-caffeine (FIORICET, ESGIC) 50-325-40 MG per tablet Take 1 tablet by mouth 2 (two) times daily as needed for headache.      . cloNIDine (CATAPRES) 0.2 MG tablet Take 1 tablet (0.2 mg total) by mouth 2 (two) times daily.  30 tablet  0  . hydrochlorothiazide (HYDRODIURIL) 25 MG tablet TAKE 1 TABLET BY MOUTH EVERY DAY  90 tablet  0  . ibuprofen (ADVIL,MOTRIN) 600 MG tablet Take 1 tablet (600 mg total) by mouth every 6 (six) hours.  30 tablet  5  . labetalol (NORMODYNE) 200 MG tablet TAKE 2 TABLETS BY MOUTH TWICE DAILY  360 tablet  0  . omeprazole (PRILOSEC) 20 MG capsule Take 20 mg by mouth 3 (three) times daily.      Marland Kitchen  oxyCODONE-acetaminophen (PERCOCET/ROXICET) 5-325 MG per tablet Take 2 tablets by mouth every 4 (four) hours as needed for severe pain.  40 tablet  0  . Prenatal Vit-Fe Fumarate-FA (PRENATAL MULTIVITAMIN) TABS tablet Take 1 tablet by mouth daily.        Review of Systems  Constitutional: Negative for fever and chills.  Gastrointestinal: Positive for nausea and abdominal pain. Negative for diarrhea and constipation.  Genitourinary: Negative for dysuria and urgency.  Neurological: Positive for headaches.   Physical Exam   Blood pressure 157/109, pulse 78, temperature 98.5 F (36.9 C), temperature source Oral, resp. rate 20, last menstrual period 02/28/2013, not currently  breastfeeding.  Physical Exam  Nursing note and vitals reviewed. Constitutional: She is oriented to person, place, and time. She appears well-developed and well-nourished. No distress.  HENT:  Head: Normocephalic.  Eyes: Pupils are equal, round, and reactive to light.  Neck: Normal range of motion.  Cardiovascular: Normal rate.   Respiratory: Effort normal.  GI: Soft. She exhibits no distension. There is no tenderness. There is no rebound and no guarding.  Musculoskeletal: Normal range of motion.  Neurological: She is alert and oriented to person, place, and time.  Skin: Skin is warm and dry.  Psychiatric: She has a normal mood and affect.    MAU Course  Procedures Results for orders placed during the hospital encounter of 12/05/13 (from the past 24 hour(s))  CBC     Status: Abnormal   Collection Time    12/05/13 11:20 AM      Result Value Range   WBC 4.8  4.0 - 10.5 K/uL   RBC 3.61 (*) 3.87 - 5.11 MIL/uL   Hemoglobin 11.4 (*) 12.0 - 15.0 g/dL   HCT 40.9 (*) 81.1 - 91.4 %   MCV 90.6  78.0 - 100.0 fL   MCH 31.6  26.0 - 34.0 pg   MCHC 34.9  30.0 - 36.0 g/dL   RDW 78.2  95.6 - 21.3 %   Platelets 261  150 - 400 K/uL  COMPREHENSIVE METABOLIC PANEL     Status: Abnormal   Collection Time    12/05/13 11:20 AM      Result Value Range   Sodium 136  135 - 145 mEq/L   Potassium 4.1  3.5 - 5.1 mEq/L   Chloride 102  96 - 112 mEq/L   CO2 23  19 - 32 mEq/L   Glucose, Bld 88  70 - 99 mg/dL   BUN 11  6 - 23 mg/dL   Creatinine, Ser 0.86  0.50 - 1.10 mg/dL   Calcium 9.4  8.4 - 57.8 mg/dL   Total Protein 8.1  6.0 - 8.3 g/dL   Albumin 3.9  3.5 - 5.2 g/dL   AST 29  0 - 37 U/L   ALT 27  0 - 35 U/L   Alkaline Phosphatase 57  39 - 117 U/L   Total Bilirubin 0.2 (*) 0.3 - 1.2 mg/dL   GFR calc non Af Amer >90  >90 mL/min   GFR calc Af Amer >90  >90 mL/min  LACTATE DEHYDROGENASE     Status: Abnormal   Collection Time    12/05/13 11:20 AM      Result Value Range   LDH 261 (*) 94 - 250 U/L   URIC ACID     Status: None   Collection Time    12/05/13 11:20 AM      Result Value Range   Uric Acid, Serum 6.5  2.4 -  7.0 mg/dL  waiting urine protein/creatinine ration Discussed with Dr. Clearance Coots- reviewed BP readings, pt's persistant headache- Will give morphine 10mg  and phenergan 25mg  IM per Dr. Clearance Coots and then headache protocol IV with decadron, benadryl and reglan and reassess Pt's headache resolved with the medication- pt's BP went from 158/118 to 145/95 Discussed with Dr. Clearance Coots- will stop current BP meds and start Benicar HCT 40/25 since pt has previously been on that medication and did well. Will also start zoloft 50mg  and pt can take Xanax if needed (pt can call office for refill if she does not have enough) Pt has Percocet if needed for headache Pt to be seen at Dr. Verdell Carmine office in 1 week   Results for orders placed during the hospital encounter of 12/05/13 (from the past 24 hour(s))  PROTEIN / CREATININE RATIO, URINE     Status: None   Collection Time    12/05/13 11:15 AM      Result Value Range   Creatinine, Urine 471.84     Total Protein, Urine 48     PROTEIN CREATININE RATIO 0.10  0.00 - 0.15  CBC     Status: Abnormal   Collection Time    12/05/13 11:20 AM      Result Value Range   WBC 4.8  4.0 - 10.5 K/uL   RBC 3.61 (*) 3.87 - 5.11 MIL/uL   Hemoglobin 11.4 (*) 12.0 - 15.0 g/dL   HCT 16.1 (*) 09.6 - 04.5 %   MCV 90.6  78.0 - 100.0 fL   MCH 31.6  26.0 - 34.0 pg   MCHC 34.9  30.0 - 36.0 g/dL   RDW 40.9  81.1 - 91.4 %   Platelets 261  150 - 400 K/uL  COMPREHENSIVE METABOLIC PANEL     Status: Abnormal   Collection Time    12/05/13 11:20 AM      Result Value Range   Sodium 136  135 - 145 mEq/L   Potassium 4.1  3.5 - 5.1 mEq/L   Chloride 102  96 - 112 mEq/L   CO2 23  19 - 32 mEq/L   Glucose, Bld 88  70 - 99 mg/dL   BUN 11  6 - 23 mg/dL   Creatinine, Ser 7.82  0.50 - 1.10 mg/dL   Calcium 9.4  8.4 - 95.6 mg/dL   Total Protein 8.1  6.0 - 8.3 g/dL   Albumin  3.9  3.5 - 5.2 g/dL   AST 29  0 - 37 U/L   ALT 27  0 - 35 U/L   Alkaline Phosphatase 57  39 - 117 U/L   Total Bilirubin 0.2 (*) 0.3 - 1.2 mg/dL   GFR calc non Af Amer >90  >90 mL/min   GFR calc Af Amer >90  >90 mL/min  LACTATE DEHYDROGENASE     Status: Abnormal   Collection Time    12/05/13 11:20 AM      Result Value Range   LDH 261 (*) 94 - 250 U/L  URIC ACID     Status: None   Collection Time    12/05/13 11:20 AM      Result Value Range   Uric Acid, Serum 6.5  2.4 - 7.0 mg/dL  discussed with Dr. Clearance Coots- will try Benicar HCT40/25mg  since pt has been on before and did well Will also start Zoloft 50mg  and have pt f/u in office in 1 week Pt has prescription for Xanax if needed Pt also has Percocet if needed for  headache Assessment and Plan  Hypertension- stop current meds and start Benicar HCT 40/25- call office to change if not covered on insurance Depression- start Zoloft 50mg  one daily; Xanax if needed until Zoloft takes affect; F/u with Dr. Verdell Carmine office in 1 week  [facility] 12/05/2013, 11:47 AM

## 2013-12-05 NOTE — Progress Notes (Signed)
Subjective:     Paula Dominguez is a 32 y.o. female who presents for a postpartum visit. She is 2 weeks postpartum following a spontaneous vaginal delivery. I have fully reviewed the prenatal and intrapartum course. The delivery was at 37 gestational weeks. Outcome: spontaneous vaginal delivery. Anesthesia: none. Postpartum course has been difficult.  Pt states that she is having lots of pain.  Pt is also battling with her depression and home life. Pt states that she has no energy and no appetite.  Pt is still having increase in BP. Pt states that she is also having some blur in vision, Baby's course has been going well. Baby is feeding by bottle - Enfamil AR. Bleeding thick, heavy lochia and clots. Bowel function is normal.  Pt states that she believes she has hemorids.  Bladder function is normal. Patient is not sexually active. Contraception method is Depo-Provera injections. Postpartum depression screening: 24.  The following portions of the patient's history were reviewed and updated as appropriate: allergies, current medications, past family history, past medical history, past social history, past surgical history and problem list.  Review of Systems Pertinent items are noted in HPI.   Objective:    Pulse 77  Temp(Src) 99 F (37.2 C)  Wt 197 lb (89.359 kg)  General:  alert and no distress Abdomen:  Soft, NT. Pelvic:  NEFG.  Uterus NSSC, NT.   Assessment:     Normal postpartum exam. Pap smear not done at today's visit.   HTN  Plan:    1. Contraception: none 2. Sent to [facility] for HTN. 3. Follow up in: 1 week or as needed.

## 2013-12-06 ENCOUNTER — Encounter: Payer: Self-pay | Admitting: Obstetrics

## 2013-12-25 ENCOUNTER — Other Ambulatory Visit: Payer: Self-pay | Admitting: Obstetrics

## 2013-12-26 ENCOUNTER — Other Ambulatory Visit: Payer: Self-pay | Admitting: Obstetrics

## 2014-01-02 ENCOUNTER — Ambulatory Visit: Payer: Medicaid Other | Admitting: Obstetrics

## 2014-01-06 ENCOUNTER — Other Ambulatory Visit: Payer: Self-pay | Admitting: Obstetrics

## 2014-01-06 ENCOUNTER — Encounter (HOSPITAL_COMMUNITY): Payer: Self-pay | Admitting: Emergency Medicine

## 2014-01-06 ENCOUNTER — Ambulatory Visit: Payer: Medicaid Other | Admitting: Obstetrics

## 2014-01-06 ENCOUNTER — Emergency Department (HOSPITAL_COMMUNITY)
Admission: EM | Admit: 2014-01-06 | Discharge: 2014-01-06 | Disposition: A | Discharge status: Home / Self Care | Payer: Medicaid Other | Attending: Emergency Medicine | Admitting: Emergency Medicine

## 2014-01-06 DIAGNOSIS — Z87891 Personal history of nicotine dependence: Secondary | ICD-10-CM | POA: Insufficient documentation

## 2014-01-06 DIAGNOSIS — Z79899 Other long term (current) drug therapy: Secondary | ICD-10-CM | POA: Insufficient documentation

## 2014-01-06 DIAGNOSIS — I10 Essential (primary) hypertension: Secondary | ICD-10-CM | POA: Insufficient documentation

## 2014-01-06 DIAGNOSIS — F411 Generalized anxiety disorder: Secondary | ICD-10-CM | POA: Insufficient documentation

## 2014-01-06 DIAGNOSIS — F329 Major depressive disorder, single episode, unspecified: Secondary | ICD-10-CM | POA: Insufficient documentation

## 2014-01-06 DIAGNOSIS — F3289 Other specified depressive episodes: Secondary | ICD-10-CM | POA: Insufficient documentation

## 2014-01-06 DIAGNOSIS — K645 Perianal venous thrombosis: Secondary | ICD-10-CM | POA: Insufficient documentation

## 2014-01-06 MED ORDER — PROPOFOL 10 MG/ML IV BOLUS
0.5000 mg/kg | Freq: Once | INTRAVENOUS | Status: DC
  Filled 2014-01-06: qty 1

## 2014-01-06 MED ORDER — LIDOCAINE HCL 2 % EX GEL
Freq: Once | CUTANEOUS | Status: AC
  Administered 2014-01-06: 10 via TOPICAL
  Filled 2014-01-06: qty 10

## 2014-01-06 MED ORDER — HYDROMORPHONE HCL PF 1 MG/ML IJ SOLN
0.5000 mg | Freq: Once | INTRAMUSCULAR | Status: AC
  Administered 2014-01-06: 0.5 mg via INTRAVENOUS
  Filled 2014-01-06: qty 1

## 2014-01-06 MED ORDER — HYDROCORTISONE 2.5 % RE CREA: TOPICAL_CREAM | RECTAL | Status: DC

## 2014-01-06 MED ORDER — OXYCODONE-ACETAMINOPHEN 5-325 MG PO TABS: 1.0000 | ORAL_TABLET | Freq: Four times a day (QID) | ORAL | Status: DC | PRN

## 2014-01-06 MED ORDER — ONDANSETRON HCL 4 MG/2ML IJ SOLN
4.0000 mg | Freq: Once | INTRAMUSCULAR | Status: AC
  Administered 2014-01-06: 4 mg via INTRAVENOUS
  Filled 2014-01-06: qty 2

## 2014-01-06 MED ORDER — PROPOFOL 10 MG/ML IV BOLUS
INTRAVENOUS | Status: AC | PRN
  Administered 2014-01-06: 43.75 mg via INTRAVENOUS

## 2014-01-06 MED ORDER — SODIUM CHLORIDE 0.9 % IV BOLUS (SEPSIS)
500.0000 mL | Freq: Once | INTRAVENOUS | Status: AC
  Administered 2014-01-06: 500 mL via INTRAVENOUS

## 2014-01-06 MED ORDER — DOCUSATE SODIUM 50 MG PO CAPS: 50.0000 mg | ORAL_CAPSULE | Freq: Two times a day (BID) | ORAL | Status: DC

## 2014-01-06 NOTE — Discharge Instructions (Signed)
Hemorrhoids °Hemorrhoids are swollen veins around the rectum or anus. There are two types of hemorrhoids:  °· Internal hemorrhoids. These occur in the veins just inside the rectum. They may poke through to the outside and become irritated and painful. °· External hemorrhoids. These occur in the veins outside the anus and can be felt as a painful swelling or hard lump near the anus. °CAUSES °· Pregnancy.   °· Obesity.   °· Constipation or diarrhea.   °· Straining to have a bowel movement.   °· Sitting for long periods on the toilet. °· Heavy lifting or other activity that caused you to strain. °· Anal intercourse. °SYMPTOMS  °· Pain.   °· Anal itching or irritation.   °· Rectal bleeding.   °· Fecal leakage.   °· Anal swelling.   °· One or more lumps around the anus.   °DIAGNOSIS  °Your caregiver may be able to diagnose hemorrhoids by visual examination. Other examinations or tests that may be performed include:  °· Examination of the rectal area with a gloved hand (digital rectal exam).   °· Examination of anal canal using a small tube (scope).   °· A blood test if you have lost a significant amount of blood. °· A test to look inside the colon (sigmoidoscopy or colonoscopy). °TREATMENT °Most hemorrhoids can be treated at home. However, if symptoms do not seem to be getting better or if you have a lot of rectal bleeding, your caregiver may perform a procedure to help make the hemorrhoids get smaller or remove them completely. Possible treatments include:  °· Placing a rubber band at the base of the hemorrhoid to cut off the circulation (rubber band ligation).   °· Injecting a chemical to shrink the hemorrhoid (sclerotherapy).   °· Using a tool to burn the hemorrhoid (infrared light therapy).   °· Surgically removing the hemorrhoid (hemorrhoidectomy).   °· Stapling the hemorrhoid to block blood flow to the tissue (hemorrhoid stapling).   °HOME CARE INSTRUCTIONS  °· Eat foods with fiber, such as whole grains, beans,  nuts, fruits, and vegetables. Ask your doctor about taking products with added fiber in them (fiber supplements). °· Increase fluid intake. Drink enough water and fluids to keep your urine clear or pale yellow.   °· Exercise regularly.   °· Go to the bathroom when you have the urge to have a bowel movement. Do not wait.   °· Avoid straining to have bowel movements.   °· Keep the anal area dry and clean. Use wet toilet paper or moist towelettes after a bowel movement.   °· Medicated creams and suppositories may be used or applied as directed.   °· Only take over-the-counter or prescription medicines as directed by your caregiver.   °· Take warm sitz baths for 15 20 minutes, 3 4 times a day to ease pain and discomfort.   °· Place ice packs on the hemorrhoids if they are tender and swollen. Using ice packs between sitz baths may be helpful.   °· Put ice in a plastic bag.   °· Place a towel between your skin and the bag.   °· Leave the ice on for 15 20 minutes, 3 4 times a day.   °· Do not use a donut-shaped pillow or sit on the toilet for long periods. This increases blood pooling and pain.   °SEEK MEDICAL CARE IF: °· You have increasing pain and swelling that is not controlled by treatment or medicine. °· You have uncontrolled bleeding. °· You have difficulty or you are unable to have a bowel movement. °· You have pain or inflammation outside the area of the hemorrhoids. °MAKE SURE YOU: °·   Understand these instructions.  Will watch your condition.  Will get help right away if you are not doing well or get worse. Document Released: 12/09/2000 Document Revised: 11/28/2012 Document Reviewed: 10/16/2012 [facility] IncExitCare Patient Information 2014 [place], [place].  Fiber Content in Foods Drinking plenty of fluids and consuming foods high in fiber can help with constipation. See the list below for the fiber content of some common foods. Starches and Grains / Dietary Fiber (g)  Cheerios, 1 cup / 3 g  Kellogg's Corn  Flakes, 1 cup / 0.7 g  Rice Krispies, 1  cup / 0.3 g  Quaker Oat Life Cereal,  cup / 2.1 g  Oatmeal, instant (cooked),  cup / 2 g  Kellogg's Frosted Mini Wheats, 1 cup / 5.1 g  Rice, brown, long-grain (cooked), 1 cup / 3.5 g  Rice, white, long-grain (cooked), 1 cup / 0.6 g  Macaroni, cooked, enriched, 1 cup / 2.5 g Legumes / Dietary Fiber (g)  Beans, baked, canned, plain or vegetarian,  cup / 5.2 g  Beans, kidney, canned,  cup / 6.8 g  Beans, pinto, dried (cooked),  cup / 7.7 g  Beans, pinto, canned,  cup / 5.5 g Breads and Crackers / Dietary Fiber (g)  Graham crackers, plain or honey, 2 squares / 0.7 g  Saltine crackers, 3 squares / 0.3 g  Pretzels, plain, salted, 10 pieces / 1.8 g  Bread, whole-wheat, 1 slice / 1.9 g  Bread, white, 1 slice / 0.7 g  Bread, raisin, 1 slice / 1.2 g  Bagel, plain, 3 oz / 2 g  Tortilla, flour, 1 oz / 0.9 g  Tortilla, corn, 1 small / 1.5 g  Bun, hamburger or hotdog, 1 small / 0.9 g Fruits / Dietary Fiber (g)  Apple, raw with skin, 1 medium / 4.4 g  Applesauce, sweetened,  cup / 1.5 g  Banana,  medium / 1.5 g  Grapes, 10 grapes / 0.4 g  Orange, 1 small / 2.3 g  Raisin, 1.5 oz / 1.6 g  Melon, 1 cup / 1.4 g Vegetables / Dietary Fiber (g)  Green beans, canned,  cup / 1.3 g  Carrots (cooked),  cup / 2.3 g  Broccoli (cooked),  cup / 2.8 g  Peas, frozen (cooked),  cup / 4.4 g  Potatoes, mashed,  cup / 1.6 g  Lettuce, 1 cup / 0.5 g  Corn, canned,  cup / 1.6 g  Tomato,  cup / 1.1 g Document Released: 04/30/2007 Document Revised: 03/05/2012 Document Reviewed: 06/25/2007 Exit[employer] Patient Information 2014 [place], [place].

## 2014-01-06 NOTE — ED Provider Notes (Addendum)
CSN: [id number]     Arrival date & time 01/06/14  0809 History   First MD Initiated Contact with Patient 01/06/14 0815     Chief Complaint  Patient presents with  . Hemorrhoids   (Consider location/radiation/quality/duration/timing/severity/associated sxs/prior Treatment) HPI Comments: Patient with worsening rectal pain since the middle of December after having her sixth child. She denies any rectal bleeding but states she has had worsening rectal pain which is now 10 out of 10 sharp and severe in nature which radiates into her legs. She states she's stopped eating and drinking because she doesn't want to have a bowel movement because it hurts too badly. She has been seen in her doctor's office and told she had hemorrhoids and given cream which has not helped. She went to the [facility] emergency room last week and had a CT scan that showed it was hemorrhoids and was given suppositories which are not helping. She spoke with a [facility]stone surgeon on Wednesday of last week who told her it was an infection and she needed to do warm soaks and Neosporin. She's been doing these and symptoms seem to be worsening. She has no prior history of hemorrhoids. She denies any urinary symptoms, abdominal pain or vomiting.  The history is provided by the patient.    Past Medical History  Diagnosis Date  . Hypertension   . Anxiety and depression   . Hx of preeclampsia, prior pregnancy, currently pregnant     prior pregnancy   Past Surgical History  Procedure Laterality Date  . No past surgeries     Family History  Problem Relation Age of Onset  . Hypertension Other   . Cancer Other    History  Substance Use Topics  . Smoking status: Former Smoker -- 1.00 packs/day for 17 years    Types: Cigarettes    Quit date: 07/27/2013  . Smokeless tobacco: Never Used  . Alcohol Use: No   OB History   Grav Para Term Preterm Abortions TAB SAB Ect Mult Living   7 6 6  0 1  1   6      Review of Systems    Constitutional: Negative for fever.  Gastrointestinal: Positive for rectal pain. Negative for nausea, vomiting, abdominal pain, diarrhea, blood in stool and anal bleeding.  Genitourinary: Negative for dysuria, vaginal bleeding, vaginal discharge and pelvic pain.  All other systems reviewed and are negative.    Allergies  Review of patient's allergies indicates no known allergies.  Home Medications   Current Outpatient Rx  Name  Route  Sig  Dispense  Refill  . ALPRAZolam (XANAX) 0.5 MG tablet   Oral   Take 1 tablet (0.5 mg total) by mouth 3 (three) times daily as needed for sleep or anxiety.   90 tablet   2   . butalbital-acetaminophen-caffeine (FIORICET, ESGIC) 50-325-40 MG per tablet   Oral   Take 1 tablet by mouth 2 (two) times daily as needed for headache.         . cloNIDine (CATAPRES) 0.2 MG tablet   Oral   Take 1 tablet (0.2 mg total) by mouth 2 (two) times daily.   30 tablet   0   . ibuprofen (ADVIL,MOTRIN) 600 MG tablet   Oral   Take 1 tablet (600 mg total) by mouth every 6 (six) hours.   30 tablet   5   . olmesartan-hydrochlorothiazide (BENICAR HCT) 40-25 MG per tablet   Oral   Take 1 tablet by mouth daily.  30 tablet   0   . omeprazole (PRILOSEC) 20 MG capsule   Oral   Take 20 mg by mouth 3 (three) times daily.         Marland Kitchen oxyCODONE-acetaminophen (PERCOCET/ROXICET) 5-325 MG per tablet   Oral   Take 2 tablets by mouth every 4 (four) hours as needed for severe pain.   40 tablet   0   . Prenatal Vit-Fe Fumarate-FA (PRENATAL MULTIVITAMIN) TABS tablet   Oral   Take 1 tablet by mouth daily.         . sertraline (ZOLOFT) 50 MG tablet   Oral   Take 1 tablet (50 mg total) by mouth daily.   30 tablet   0    BP 115/64  Pulse 100  Temp(Src) 97.7 F (36.5 C) (Oral)  Resp 19  SpO2 100% Physical Exam  Nursing note and vitals reviewed. Constitutional: She is oriented to person, place, and time. She appears well-developed and well-nourished. She  appears distressed.  Appears very uncomfortable pacing around in the room  HENT:  Head: Normocephalic and atraumatic.  Mouth/Throat: Oropharynx is clear and moist.  Eyes: Conjunctivae and EOM are normal. Pupils are equal, round, and reactive to light.  Neck: Normal range of motion. Neck supple.  Cardiovascular: Normal rate, regular rhythm and intact distal pulses.   No murmur heard. Pulmonary/Chest: Effort normal and breath sounds normal. No respiratory distress. She has no wheezes. She has no rales.  Abdominal: Soft. She exhibits no distension. There is no tenderness. There is no rebound and no guarding.  Genitourinary: Rectal exam shows external hemorrhoid and tenderness.     Musculoskeletal: Normal range of motion. She exhibits no edema and no tenderness.  Neurological: She is alert and oriented to person, place, and time.  Skin: Skin is warm and dry. No rash noted. No erythema.  Psychiatric: She has a normal mood and affect. Her behavior is normal.    ED Course  Procedures (including critical care time) Labs Review Labs Reviewed - No data to display Imaging Review No results found.  EKG Interpretation   None      Procedural sedation Performed by: Gwyneth Sprout Consent: Verbal consent obtained. Risks and benefits: risks, benefits and alternatives were discussed Required items: required blood products, implants, devices, and special equipment available Patient identity confirmed: arm band and provided demographic data Time out: Immediately prior to procedure a "time out" was called to verify the correct patient, procedure, equipment, support staff and site/side marked as required.  Sedation type: moderate (conscious) sedation NPO time confirmed and considedered  Sedatives: PROPOFOL  Physician Time at Bedside: 30 min  Vitals: Vital signs were monitored during sedation. Cardiac Monitor, pulse oximeter Patient tolerance: Patient tolerated the procedure well with no  immediate complications. Comments: Pt with uneventful recovered. Returned to pre-procedural sedation baseline  INCISION AND DRAINAGE Performed by: Gwyneth Sprout Consent: Verbal consent obtained. Risks and benefits: risks, benefits and alternatives were discussed Type: thrombosed hemorrhoids  Body area: rectum  Anesthesia: local infiltration  Incision was made with a scalpel.  Local anesthetic: lidocaine 1% without epinephrine  Anesthetic total: 3 ml  Complexity: simple  Drainage:blood and small amt of clot  Drainage amount:small amt of clot  Packing material:none  Patient tolerance: Patient tolerated the procedure well with no immediate complications.      MDM   1. Thrombosed external hemorrhoid     Patient presenting with the complaint of worsening rectal pain over the last month but more severe in the last  4 days with increased swelling. Patient has been seen multiple times for this for her PCP office and also at [facility] emergency room and told she had hemorrhoids. The hemorrhoids started after the birth of her baby in December. She denies any rectal bleeding. She spoke with the referred surgeon from [facility] who told her she had an infection in the DVT use warm compresses. She states since that time it's only worsen. On exam today patient appears to have a thrombosed hemorrhoid. There appears to be no sign of infection (redness, drainage, warmth).  Given patient's severe tenderness and inability to barely tolerate exam will consciously sedate to I&D thrombosed hemorrhoid. She denies any vaginal symptoms and appears to have a normal vaginal exam by external inspection. Denies a history of significant constipation and has been using suppositories without relief.  Pt tolerated procedure well.  Hemorrhoid I&D with only minimal clot expressed but under sedation better exam and also ulcer noted on rectal tissue.  Will have her continue sitz baths, and given anusol cream  and lidocaine jelly.  Also given pain meds.  Gwyneth Sprout, MD 01/06/14 8295  Gwyneth Sprout, MD 01/06/14 [id number]

## 2014-01-06 NOTE — ED Notes (Signed)
Pt aware of need for ride home after procedure

## 2014-01-09 ENCOUNTER — Emergency Department (HOSPITAL_COMMUNITY)
Admission: EM | Admit: 2014-01-09 | Discharge: 2014-01-09 | Disposition: A | Discharge status: Home / Self Care | Payer: Medicaid Other | Attending: Emergency Medicine | Admitting: Emergency Medicine

## 2014-01-09 ENCOUNTER — Ambulatory Visit (INDEPENDENT_AMBULATORY_CARE_PROVIDER_SITE_OTHER): Payer: Medicaid Other | Admitting: Obstetrics

## 2014-01-09 ENCOUNTER — Encounter (HOSPITAL_COMMUNITY): Payer: Self-pay | Admitting: Emergency Medicine

## 2014-01-09 ENCOUNTER — Encounter: Payer: Self-pay | Admitting: Obstetrics

## 2014-01-09 VITALS — BP 139/81 | HR 103 | Temp 98.8°F | Ht 66.0 in | Wt 195.0 lb

## 2014-01-09 DIAGNOSIS — I10 Essential (primary) hypertension: Secondary | ICD-10-CM | POA: Insufficient documentation

## 2014-01-09 DIAGNOSIS — Z8659 Personal history of other mental and behavioral disorders: Secondary | ICD-10-CM | POA: Insufficient documentation

## 2014-01-09 DIAGNOSIS — K644 Residual hemorrhoidal skin tags: Secondary | ICD-10-CM | POA: Insufficient documentation

## 2014-01-09 DIAGNOSIS — F17200 Nicotine dependence, unspecified, uncomplicated: Secondary | ICD-10-CM | POA: Insufficient documentation

## 2014-01-09 DIAGNOSIS — Z9889 Other specified postprocedural states: Secondary | ICD-10-CM | POA: Insufficient documentation

## 2014-01-09 DIAGNOSIS — F3289 Other specified depressive episodes: Secondary | ICD-10-CM

## 2014-01-09 DIAGNOSIS — F4001 Agoraphobia with panic disorder: Secondary | ICD-10-CM

## 2014-01-09 DIAGNOSIS — Z3009 Encounter for other general counseling and advice on contraception: Secondary | ICD-10-CM

## 2014-01-09 DIAGNOSIS — O99345 Other mental disorders complicating the puerperium: Secondary | ICD-10-CM | POA: Insufficient documentation

## 2014-01-09 DIAGNOSIS — K649 Unspecified hemorrhoids: Secondary | ICD-10-CM

## 2014-01-09 DIAGNOSIS — F53 Postpartum depression: Secondary | ICD-10-CM

## 2014-01-09 DIAGNOSIS — O872 Hemorrhoids in the puerperium: Secondary | ICD-10-CM | POA: Insufficient documentation

## 2014-01-09 DIAGNOSIS — Z8742 Personal history of other diseases of the female genital tract: Secondary | ICD-10-CM | POA: Insufficient documentation

## 2014-01-09 DIAGNOSIS — Z79899 Other long term (current) drug therapy: Secondary | ICD-10-CM | POA: Insufficient documentation

## 2014-01-09 DIAGNOSIS — F341 Dysthymic disorder: Secondary | ICD-10-CM | POA: Insufficient documentation

## 2014-01-09 DIAGNOSIS — F329 Major depressive disorder, single episode, unspecified: Secondary | ICD-10-CM

## 2014-01-09 LAB — POCT URINALYSIS DIPSTICK
BILIRUBIN UA: NEGATIVE
Ketones, UA: NEGATIVE
LEUKOCYTES UA: NEGATIVE
NITRITE UA: NEGATIVE
PH UA: 5
Spec Grav, UA: 1.015
Urobilinogen, UA: NEGATIVE

## 2014-01-09 MED ORDER — DOCUSATE SODIUM 100 MG PO CAPS: 100.0000 mg | ORAL_CAPSULE | Freq: Two times a day (BID) | ORAL | Status: DC

## 2014-01-09 MED ORDER — OXYCODONE HCL 10 MG PO TABS: 10.0000 mg | ORAL_TABLET | Freq: Four times a day (QID) | ORAL | Status: DC | PRN

## 2014-01-09 MED ORDER — HYDROCORTISONE 2.5 % RE CREA: 1.0000 "application " | TOPICAL_CREAM | Freq: Two times a day (BID) | RECTAL | Status: DC

## 2014-01-09 MED ORDER — MEDROXYPROGESTERONE ACETATE 150 MG/ML IM SUSP: 150.0000 mg | INTRAMUSCULAR | Status: DC

## 2014-01-09 MED ORDER — OXYCODONE-ACETAMINOPHEN 5-325 MG PO TABS
1.0000 | ORAL_TABLET | Freq: Once | ORAL | Status: AC
  Administered 2014-01-09: 1 via ORAL
  Filled 2014-01-09: qty 1

## 2014-01-09 MED ORDER — OXYCODONE-ACETAMINOPHEN 5-325 MG PO TABS: 1.0000 | ORAL_TABLET | Freq: Four times a day (QID) | ORAL | Status: DC | PRN

## 2014-01-09 MED ORDER — ALPRAZOLAM 0.5 MG PO TABS: 0.5000 mg | ORAL_TABLET | Freq: Two times a day (BID) | ORAL | Status: DC | PRN

## 2014-01-09 MED ORDER — LIDOCAINE 5 % EX OINT: 1.0000 "application " | TOPICAL_OINTMENT | CUTANEOUS | Status: DC | PRN

## 2014-01-09 MED ORDER — HYDROCORTISONE 2.5 % RE CREA: TOPICAL_CREAM | RECTAL | Status: DC

## 2014-01-09 NOTE — ED Provider Notes (Signed)
CSN: [id number]     Arrival date & time 01/09/14  1914 History   First MD Initiated Contact with Patient 01/09/14 1946     Chief Complaint  Patient presents with  . Post-op Problem    HPI Patient presents to emergency room with complaints of painful hemorrhoids. Patient recently delivered a child. Complicating her pregnancy she developed hemorrhoids. 3 days ago she was seen in the emergency room for a painful hemorrhoid. Patient had incision and clot removal of a thrombosed hemorrhoid performed by Dr. Tanna SavoyPlunket in the emergency department on January 12. Patient states she had some initial relief with that procedure but subsequently she's had other hemorrhoids become more painful and swollen once again. She has noticed some intermittent rectal bleeding. The patient saw her OB/GYN doctor today and was told to come to the emergency room to see a surgeon regarding her hemorrhoids. She denies fevers or chills or any other complications at this time.  Past Medical History  Diagnosis Date  . Hypertension   . Anxiety and depression   . Hx of preeclampsia, prior pregnancy, currently pregnant     prior pregnancy   Past Surgical History  Procedure Laterality Date  . No past surgeries     Family History  Problem Relation Age of Onset  . Hypertension Other   . Cancer Other    History  Substance Use Topics  . Smoking status: Current Every Day Smoker -- 1.00 packs/day for 17 years    Types: Cigarettes    Last Attempt to Quit: 07/27/2013  . Smokeless tobacco: Never Used  . Alcohol Use: No   OB History   Grav Para Term Preterm Abortions TAB SAB Ect Mult Living   7 6 6  0 1  1   6      Review of Systems  All other systems reviewed and are negative.    Allergies  Review of patient's allergies indicates no known allergies.  Home Medications   Current Outpatient Rx  Name  Route  Sig  Dispense  Refill  . ALPRAZolam (XANAX) 0.5 MG tablet   Oral   Take 1 tablet (0.5 mg total) by mouth 2  (two) times daily as needed for anxiety or sleep.   90 tablet   0   . benazepril-hydrochlorthiazide (LOTENSIN HCT) 20-25 MG per tablet   Oral   Take 2 tablets by mouth daily.         . cloNIDine (CATAPRES) 0.2 MG tablet   Oral   Take 1 tablet (0.2 mg total) by mouth 2 (two) times daily.   30 tablet   0   . ibuprofen (ADVIL,MOTRIN) 600 MG tablet   Oral   Take 1 tablet (600 mg total) by mouth every 6 (six) hours.   30 tablet   5   . medroxyPROGESTERone (DEPO-PROVERA) 150 MG/ML injection   Intramuscular   Inject 1 mL (150 mg total) into the muscle every 3 (three) months.   1 mL   0   . omeprazole (PRILOSEC) 20 MG capsule   Oral   Take 20 mg by mouth 3 (three) times daily.         Marland Kitchen. oxyCODONE-acetaminophen (PERCOCET/ROXICET) 5-325 MG per tablet   Oral   Take 1 tablet by mouth every 6 (six) hours as needed.   15 tablet   0   . Prenatal Vit-Fe Fumarate-FA (PRENATAL MULTIVITAMIN) TABS tablet   Oral   Take 1 tablet by mouth daily.         .Marland Kitchen  sertraline (ZOLOFT) 50 MG tablet   Oral   Take 1 tablet (50 mg total) by mouth daily.   30 tablet   0   . hydrocortisone (ANUSOL-HC) 2.5 % rectal cream      Apply rectally 2 times daily   30 g   0   . lidocaine (XYLOCAINE) 5 % ointment   Topical   Apply 1 application topically as needed.   35.44 g   0   . oxyCODONE-acetaminophen (PERCOCET/ROXICET) 5-325 MG per tablet   Oral   Take 1-2 tablets by mouth every 6 (six) hours as needed.   16 tablet   0    BP 124/79  Pulse 93  Temp(Src) 98.6 F (37 C) (Oral)  Resp 20  SpO2 100% Physical Exam  Nursing note and vitals reviewed. Constitutional: She appears well-developed and well-nourished. No distress.  HENT:  Head: Normocephalic and atraumatic.  Right Ear: External ear normal.  Left Ear: External ear normal.  Eyes: Conjunctivae are normal. Right eye exhibits no discharge. Left eye exhibits no discharge. No scleral icterus.  Neck: Neck supple. No tracheal  deviation present.  Cardiovascular: Normal rate.   Pulmonary/Chest: Effort normal. No stridor. No respiratory distress.  Genitourinary: Rectal exam shows external hemorrhoid and tenderness.  Soft tender external hemorrhoid, no purple discoloration, no active bleeding  Musculoskeletal: She exhibits no edema.  Neurological: She is alert. Cranial nerve deficit: no gross deficits.  Skin: Skin is warm and dry. No rash noted.  Psychiatric: She has a normal mood and affect.    ED Course  Procedures (including critical care time) Labs Review Labs Reviewed - No data to display Imaging Review No results found.  EKG Interpretation   None       MDM   1. Hemorrhoids    Discussed case with Dr Maisie Fus.  Pt can be seen in their office tomorrow.  She should call in the am to be seen in the afternoon.  Continue sitz baths, lidocaine ointment, anusol cream.    Pain meds prescribed.  Pt is not breast feeding    Celene Kras, MD 01/09/14 2024

## 2014-01-09 NOTE — ED Notes (Signed)
Pt ambulating independently w/ steady gait on d/c in no acute distress, A&Ox4. D/c instructions reviewed w/ pt and family - pt and family deny any further questions or concerns at present. Rx given x5  

## 2014-01-09 NOTE — Discharge Instructions (Signed)
Hemorrhoids Hemorrhoids are swollen veins around the rectum or anus. There are two types of hemorrhoids:   Internal hemorrhoids. These occur in the veins just inside the rectum. They may poke through to the outside and become irritated and painful.  External hemorrhoids. These occur in the veins outside the anus and can be felt as a painful swelling or hard lump near the anus. CAUSES  Pregnancy.   Obesity.   Constipation or diarrhea.   Straining to have a bowel movement.   Sitting for long periods on the toilet.  Heavy lifting or other activity that caused you to strain.  Anal intercourse. SYMPTOMS   Pain.   Anal itching or irritation.   Rectal bleeding.   Fecal leakage.   Anal swelling.   One or more lumps around the anus.  DIAGNOSIS  Your caregiver may be able to diagnose hemorrhoids by visual examination. Other examinations or tests that may be performed include:   Examination of the rectal area with a gloved hand (digital rectal exam).   Examination of anal canal using a small tube (scope).   A blood test if you have lost a significant amount of blood.  A test to look inside the colon (sigmoidoscopy or colonoscopy). TREATMENT Most hemorrhoids can be treated at home. However, if symptoms do not seem to be getting better or if you have a lot of rectal bleeding, your caregiver may perform a procedure to help make the hemorrhoids get smaller or remove them completely. Possible treatments include:  1. Placing a rubber band at the base of the hemorrhoid to cut off the circulation (rubber band ligation).  2. Injecting a chemical to shrink the hemorrhoid (sclerotherapy).  3. Using a tool to burn the hemorrhoid (infrared light therapy).  4. Surgically removing the hemorrhoid (hemorrhoidectomy).  5. Stapling the hemorrhoid to block blood flow to the tissue (hemorrhoid stapling).  HOME CARE INSTRUCTIONS   Eat foods with fiber, such as whole grains,  beans, nuts, fruits, and vegetables. Ask your doctor about taking products with added fiber in them (fibersupplements).  Increase fluid intake. Drink enough water and fluids to keep your urine clear or pale yellow.   Exercise regularly.   Go to the bathroom when you have the urge to have a bowel movement. Do not wait.   Avoid straining to have bowel movements.   Keep the anal area dry and clean. Use wet toilet paper or moist towelettes after a bowel movement.   Medicated creams and suppositories may be used or applied as directed.   Only take over-the-counter or prescription medicines as directed by your caregiver.   Take warm sitz baths for 15 20 minutes, 3 4 times a day to ease pain and discomfort.   Place ice packs on the hemorrhoids if they are tender and swollen. Using ice packs between sitz baths may be helpful.   Put ice in a plastic bag.   Place a towel between your skin and the bag.   Leave the ice on for 15 20 minutes, 3 4 times a day.   Do not use a donut-shaped pillow or sit on the toilet for long periods. This increases blood pooling and pain.  SEEK MEDICAL CARE IF:  You have increasing pain and swelling that is not controlled by treatment or medicine.  You have uncontrolled bleeding.  You have difficulty or you are unable to have a bowel movement.  You have pain or inflammation outside the area of the hemorrhoids. MAKE SURE YOU:  Understand these instructions.  Will watch your condition.  Will get help right away if you are not doing well or get worse. Document Released: 12/09/2000 Document Revised: 11/28/2012 Document Reviewed: 10/16/2012 [facility] Patient Information 2014 [place], [place]. Sitz Bath A sitz bath is a warm water bath taken in the sitting position that covers only the hips and buttocks. It may be used for either healing or hygiene purposes. Sitz baths are also used to relieve pain, itching, or muscle spasms. The water may contain  medicine. Moist heat will help you heal and relax.  HOME CARE INSTRUCTIONS  Take 3 to 4 sitz baths a day. 6. Fill the bathtub half full with warm water. 7. Sit in the water and open the drain a little. 8. Turn on the warm water to keep the tub half full. Keep the water running constantly. 9. Soak in the water for 15 to 20 minutes. 10. After the sitz bath, pat the affected area dry first. SEEK MEDICAL CARE IF:  You get worse instead of better. Stop the sitz baths if you get worse. MAKE SURE YOU:  Understand these instructions.  Will watch your condition.  Will get help right away if you are not doing well or get worse. Document Released: 09/03/2004 Document Revised: 09/05/2012 Document Reviewed: 03/11/2011 [facility]ExitCare Patient Information 2014 [place], [place].

## 2014-01-09 NOTE — ED Notes (Signed)
Pt presents w/ postpartum related hemorrhoids and acute rectal pain, pt was seen at this facility previously for same and had I&D performed by EDP at that time, pt c/o continued pain and rectal bleeding.

## 2014-01-09 NOTE — Progress Notes (Signed)
   CARE MANAGEMENT ED NOTE 01/09/2014  Patient:  Paula Dominguez,Paula Dominguez   Account Number:  [id number]  Date Initiated:  01/09/2014  Documentation initiated by:  Edd ArbourGIBBS,Srah Ake  Subjective/Objective Assessment:   33 yr old female with medicaid of [place] family planning states has no assigned pcp but sees ob gyn dr Clearance Cootsharper Pt voiced understanding of discussing her concern with a bill with the surgeon's billing office and need of a pcp for follow up care     Subjective/Objective Assessment Detail:   after seeing the surgeon She states she will see DSS about a medicaid pcp     Action/Plan:   CM spoke with EDP, Lynelle DoctorKnapp, pt and her female friend at bedside   Action/Plan Detail:   Anticipated DC Date:  01/09/2014     Status Recommendation to Physician:   Result of Recommendation:    Other ED Services  Consult Working Plan    DC Planning Services  Other  PCP issues  Outpatient Services - Pt will follow up    Choice offered to / List presented to:            Status of service:  Completed, signed off  ED Comments:   ED Comments Detail:

## 2014-01-09 NOTE — Progress Notes (Signed)
Subjective:     Paula Dominguez is a 33 y.o. female who presents for a postpartum visit. She is 7 weeks postpartum following a spontaneous vaginal delivery. I have fully reviewed the prenatal and intrapartum course. The delivery was at 38 gestational weeks. Outcome: spontaneous vaginal delivery. Anesthesia: epidural. Postpartum course has been postpartum depression and hemorrhoids,hemorrhoidectomy, reports still passing a lot of blood clots . Baby's course has been normal. Baby is feeding by bottle - enfamil and gerber. Bleeding none. Bowel function is constipation. Bladder function is normal. Patient is not sexually active. Contraception method is none. Postpartum depression screening: positive. Score 30 severe depression  The following portions of the patient's history were reviewed and updated as appropriate: allergies, current medications, past family history, past medical history, past social history, past surgical history and problem list.  Review of Systems Pertinent items are noted in HPI.   Objective:    BP 139/81  Pulse 103  Temp(Src) 98.8 F (37.1 C)  Ht 5\' 6"  (1.676 m)  Wt 195 lb (88.451 kg)  BMI 31.49 kg/m2  Breastfeeding? No  General:  alert and moderate distress Breasts:  Negative Abdomen:  Soft, NT. NEFG.  Uterus NSSC, NT. Anus:  Thrombosed hemorrhoid at 36 o' clock, tender.   Assessment:     7 weeks postpartum.  Thrombosed hemorrhoid.  Postpartum Depression  Plan:    1. Contraception: Depo-Provera injections and tubal ligation 2. Tubal papers signed today. 3. Follow up in: 4 weeks or as needed.   Tubal consult.   Postpartum Depression and Baby Blues The postpartum period begins right after the birth of a baby. During this time, there is often a great amount of joy and excitement. It is also a time of considerable changes in the life of the parent(s). Regardless of how many times a mother gives birth, each child brings new challenges and dynamics to the family.  It is not unusual to have feelings of excitement accompanied by confusing shifts in moods, emotions, and thoughts. All mothers are at risk of developing postpartum depression or the "baby blues." These mood changes can occur right after giving birth, or they may occur many months after giving birth. The baby blues or postpartum depression can be mild or severe. Additionally, postpartum depression can resolve rather quickly, or it can be a long-term condition. CAUSES Elevated hormones and their rapid decline are thought to be a main cause of postpartum depression and the baby blues. There are a number of hormones that radically change during and after pregnancy. Estrogen and progesterone usually decrease immediately after delivering your baby. The level of thyroid hormone and various cortisol steroids also rapidly drop. Other factors that play a major role in these changes include major life events and genetics.  RISK FACTORS If you have any of the following risks for the baby blues or postpartum depression, know what symptoms to watch out for during the postpartum period. Risk factors that may increase the likelihood of getting the baby blues or postpartum depression include:  Havinga personal or family history of depression.   Having depression while being pregnant.   Having premenstrual or oral contraceptive-associated mood issues.   Having exceptional life stress.   Having marital conflict.   Lacking a social support network.   Having a baby with special needs.   Having health problems such as diabetes.  SYMPTOMS Baby blues symptoms include:  Brief fluctuations in mood, such as going from extreme happiness to sadness.   Decreased concentration.   Difficulty  sleeping.   Crying spells, tearfulness.   Irritability.   Anxiety.  Postpartum depression symptoms typically begin within the first month after giving birth. These symptoms include:  Difficulty sleeping or excessive  sleepiness.   Marked weight loss.   Agitation.   Feelings of worthlessness.   Lack of interest in activity or food.  Postpartum psychosis is a very concerning condition and can be dangerous. Fortunately, it is rare. Displaying any of the following symptoms is cause for immediate medical attention. Postpartum psychosis symptoms include:  Hallucinations and delusions.   Bizarre or disorganized behavior.   Confusion or disorientation.  DIAGNOSIS  A diagnosis is made by an evaluation of your symptoms. There are no medical or lab tests that lead to a diagnosis, but there are various questionnaires that a caregiver may use to identify those with the baby blues, postpartum depression, or psychosis. Often times, a screening tool called the [place] Postnatal Depression Scale is used to diagnose depression in the postpartum period.  TREATMENT The baby blues usually goes away on its own in 1 to 2 weeks. Social support is often all that is needed. You should be encouraged to get adequate sleep and rest. Occasionally, you may be given medicines to help you sleep.  Postpartum depression requires treatment as it can last several months or longer if it is not treated. Treatment may include individual or group therapy, medicine, or both to address any social, physiological, and psychological factors that may play a role in the depression. Regular exercise, a healthy diet, rest, and social support may also be strongly recommended.  Postpartum psychosis is more serious and needs treatment right away. Hospitalization is often needed. HOME CARE INSTRUCTIONS  Get as much rest as you can. Nap when the baby sleeps.   Exercise regularly. Some women find yoga and walking to be beneficial.   Eat a balanced and nourishing diet.   Do little things that you enjoy. Have a cup of tea, take a bubble bath, read your favorite magazine, or listen to your favorite music.   Avoid alcohol.   Ask for help with household  chores, cooking, grocery shopping, or running errands as needed. Do not try to do everything.   Talk to people close to you about how you are feeling. Get support from your partner, family members, friends, or other new moms.   Try to stay positive in how you think. Think about the things you are grateful for.   Do not spend a lot of time alone.   Only take medicine as directed by your caregiver.   Keep all your postpartum appointments.   Let your caregiver know if you have any concerns.  SEEK MEDICAL CARE IF: You are having a reaction or problems with your medicine. SEEK IMMEDIATE MEDICAL CARE IF:  You have suicidal feelings.   You feel you may harm the baby or someone else.  Document Released: 09/15/2004 Document Revised: 12/01/2011 Document Reviewed: 10/18/2011 [facility] Patient Information 2012 [place], [place].

## 2014-01-09 NOTE — ED Notes (Signed)
Pt arrived to the ED with a complaint of post operative surgery complications.  Pt had surgery for hemmroids on the 12 of January.  Pt stated she was still having problems today and went to visit her Doctor who advised her to come to the ED.  Pt states she is having  rectal bleeding.  Pt states she has observed some clots.

## 2014-01-10 ENCOUNTER — Encounter (HOSPITAL_COMMUNITY): Payer: Self-pay | Admitting: Emergency Medicine

## 2014-01-10 ENCOUNTER — Encounter: Payer: Self-pay | Admitting: General Surgery

## 2014-01-10 ENCOUNTER — Emergency Department (HOSPITAL_COMMUNITY)
Admission: EM | Admit: 2014-01-10 | Discharge: 2014-01-10 | Disposition: A | Discharge status: Home / Self Care | Payer: Medicaid Other | Attending: Emergency Medicine | Admitting: Emergency Medicine

## 2014-01-10 DIAGNOSIS — K649 Unspecified hemorrhoids: Secondary | ICD-10-CM

## 2014-01-10 DIAGNOSIS — K5900 Constipation, unspecified: Secondary | ICD-10-CM | POA: Insufficient documentation

## 2014-01-10 DIAGNOSIS — Z8742 Personal history of other diseases of the female genital tract: Secondary | ICD-10-CM | POA: Insufficient documentation

## 2014-01-10 DIAGNOSIS — K644 Residual hemorrhoidal skin tags: Secondary | ICD-10-CM | POA: Insufficient documentation

## 2014-01-10 DIAGNOSIS — K645 Perianal venous thrombosis: Secondary | ICD-10-CM

## 2014-01-10 DIAGNOSIS — F341 Dysthymic disorder: Secondary | ICD-10-CM | POA: Insufficient documentation

## 2014-01-10 DIAGNOSIS — I10 Essential (primary) hypertension: Secondary | ICD-10-CM | POA: Insufficient documentation

## 2014-01-10 DIAGNOSIS — Z79899 Other long term (current) drug therapy: Secondary | ICD-10-CM | POA: Insufficient documentation

## 2014-01-10 DIAGNOSIS — F17200 Nicotine dependence, unspecified, uncomplicated: Secondary | ICD-10-CM | POA: Insufficient documentation

## 2014-01-10 MED ORDER — METHYLCELLULOSE (LAXATIVE) PO POWD: 1.0000 | Freq: Every day | ORAL | Status: DC

## 2014-01-10 MED ORDER — LIDOCAINE-EPINEPHRINE 2 %-1:100000 IJ SOLN: 20.0000 mL | Freq: Once | INTRAMUSCULAR | Status: DC

## 2014-01-10 MED ORDER — OXYCODONE HCL 5 MG PO TABS: 5.0000 mg | ORAL_TABLET | ORAL | Status: DC | PRN

## 2014-01-10 MED ORDER — DOCUSATE SODIUM 100 MG PO CAPS: 200.0000 mg | ORAL_CAPSULE | Freq: Every day | ORAL | Status: DC

## 2014-01-10 MED ORDER — OXYCODONE HCL 5 MG PO TABS
5.0000 mg | ORAL_TABLET | ORAL | Status: DC | PRN
  Administered 2014-01-10: 10 mg via ORAL
  Filled 2014-01-10 (×2): qty 1

## 2014-01-10 NOTE — Consult Note (Signed)
Patient seen and examined.  Please see my detailed note.

## 2014-01-10 NOTE — ED Notes (Signed)
Patient was educated not to drive, operate heavy machinery, or drink alcohol while taking narcotic medication.  

## 2014-01-10 NOTE — ED Provider Notes (Signed)
CSN: [id number]     Arrival date & time 01/10/14  1214 History   First MD Initiated Contact with Patient 01/10/14 1309     Chief Complaint  Patient presents with  . hemorroids    (Consider location/radiation/quality/duration/timing/severity/associated sxs/prior Treatment) The history is provided by the patient.   patient has a history of hemorrhoids after her most recent pregnancy. She was seen in the ED 4 days ago and had a thrombosed external hemorrhoid drained by Dr. Anitra Lauth. She returned to the ED yesterday with increased pain. Dr. Lynelle Doctor reportedly discussed with Dr. Maisie Fus, who stated to followup in the office today. Patient states that they had stated that her insurance would be taken. She states she called the office today about going and I said they would not take her insurance. She states she was told to come back to the ED to the ED to take care of the problem. She's had continued pain. She has pain with bowel movements. No fevers. She states she's had some drainage of blood with wiping  Past Medical History  Diagnosis Date  . Hypertension   . Anxiety and depression   . Hx of preeclampsia, prior pregnancy, currently pregnant     prior pregnancy   Past Surgical History  Procedure Laterality Date  . No past surgeries     Family History  Problem Relation Age of Onset  . Hypertension Other   . Cancer Other    History  Substance Use Topics  . Smoking status: Current Every Day Smoker -- 1.00 packs/day for 17 years    Types: Cigarettes    Last Attempt to Quit: 07/27/2013  . Smokeless tobacco: Never Used  . Alcohol Use: No   OB History   Grav Para Term Preterm Abortions TAB SAB Ect Mult Living   7 6 6  0 1  1   6      Review of Systems  Constitutional: Negative for fever and diaphoresis.  Respiratory: Negative for shortness of breath.   Cardiovascular: Negative for leg swelling.  Gastrointestinal: Positive for anal bleeding and rectal pain. Negative for abdominal pain and  blood in stool.  Genitourinary: Negative for pelvic pain.    Allergies  Review of patient's allergies indicates no known allergies.  Home Medications   Current Outpatient Rx  Name  Route  Sig  Dispense  Refill  . ALPRAZolam (XANAX) 0.5 MG tablet   Oral   Take 1 tablet (0.5 mg total) by mouth 2 (two) times daily as needed for anxiety or sleep.   90 tablet   0   . benazepril-hydrochlorthiazide (LOTENSIN HCT) 20-25 MG per tablet   Oral   Take 2 tablets by mouth daily.         . cloNIDine (CATAPRES) 0.2 MG tablet   Oral   Take 1 tablet (0.2 mg total) by mouth 2 (two) times daily.   30 tablet   0   . hydrocortisone (ANUSOL-HC) 2.5 % rectal cream      Apply rectally 2 times daily   30 g   0   . ibuprofen (ADVIL,MOTRIN) 600 MG tablet   Oral   Take 1 tablet (600 mg total) by mouth every 6 (six) hours.   30 tablet   5   . omeprazole (PRILOSEC) 20 MG capsule   Oral   Take 20 mg by mouth 3 (three) times daily.         Marland Kitchen oxyCODONE-acetaminophen (PERCOCET/ROXICET) 5-325 MG per tablet   Oral   Take  1 tablet by mouth every 6 (six) hours as needed.   15 tablet   0   . Prenatal Vit-Fe Fumarate-FA (PRENATAL MULTIVITAMIN) TABS tablet   Oral   Take 1 tablet by mouth daily.         . sertraline (ZOLOFT) 50 MG tablet   Oral   Take 1 tablet (50 mg total) by mouth daily.   30 tablet   0   . docusate sodium (COLACE) 100 MG capsule   Oral   Take 2 capsules (200 mg total) by mouth daily.   60 capsule   0   . medroxyPROGESTERone (DEPO-PROVERA) 150 MG/ML injection   Intramuscular   Inject 1 mL (150 mg total) into the muscle every 3 (three) months.   1 mL   0   . methylcellulose (CITRUCEL) oral powder   Oral   Take 1 packet by mouth daily.         Marland Kitchen. oxyCODONE (OXY IR/ROXICODONE) 5 MG immediate release tablet   Oral   Take 1-2 tablets (5-10 mg total) by mouth every 4 (four) hours as needed for moderate pain.   50 tablet   0    BP 108/66  Pulse 88   Temp(Src) 98.5 F (36.9 C) (Oral)  Resp 18  SpO2 100% Physical Exam  Constitutional: She appears well-developed.  Cardiovascular: Normal rate and regular rhythm.   Pulmonary/Chest: Effort normal and breath sounds normal.  Abdominal: There is no tenderness.  Genitourinary:  Anterior external hemorrhoid. Tender. No active bleeding. Mild white drainage.  Musculoskeletal: She exhibits no edema.    ED Course  Procedures (including critical care time) Labs Review Labs Reviewed - No data to display Imaging Review No results found.  EKG Interpretation   None       MDM  No diagnosis found. Patient with hemorrhoid post incision and drainage. Pain. Per notes patient was supposed to followup in general surgery day, but per the patient they would not see her. She's been seen in the ED by general surgery.    Juliet RudeNathan R. Rubin PayorPickering, MD 01/10/14 [phone]

## 2014-01-10 NOTE — Consult Note (Signed)
Reason for Consult:  Persistently painful thrombosed external hemorrhoid Referring Physician: Dr. [name] is an 32 y.o. female.  HPI: She presented to the emergency department here on 01/06/2014 complaining of a severely painful thrombosed external hemorrhoid. She began having problems with hemorrhoids after the birth of her child in late November of 2014. She underwent an incision and drainage of a small amount of clot at that time. She started on sitz baths as well as Anusol cream and lidocaine jelly. Percocet was given to her. Her pain has continued. She presented back to the emergency department last night and again today. We were asked to see her.  She has not slept much the past 2 nights because of the pain.  Past Medical History  Diagnosis Date  . Hypertension   . Anxiety and depression   . Hx of preeclampsia, prior pregnancy, currently pregnant     prior pregnancy    Past Surgical History  Procedure Laterality Date  . No past surgeries      Family History  Problem Relation Age of Onset  . Hypertension Other   . Cancer Other     Social History:  reports that she has been smoking Cigarettes.  She has a 17 pack-year smoking history. She has never used smokeless tobacco. She reports that she does not drink alcohol or use illicit drugs.  Allergies: No Known Allergies  Prior to Admission medications   Medication Sig Start Date End Date Taking? Authorizing Provider  ALPRAZolam Prudy Feeler) 0.5 MG tablet Take 1 tablet (0.5 mg total) by mouth 2 (two) times daily as needed for anxiety or sleep. 01/09/14  Yes Brock Bad, MD  benazepril-hydrochlorthiazide (LOTENSIN HCT) 20-25 MG per tablet Take 2 tablets by mouth daily.   Yes Historical Provider, MD  cloNIDine (CATAPRES) 0.2 MG tablet Take 1 tablet (0.2 mg total) by mouth 2 (two) times daily. 11/25/13  Yes Brock Bad, MD  hydrocortisone (ANUSOL-HC) 2.5 % rectal cream Apply rectally 2 times daily 01/09/14  Yes Celene Kras, MD  ibuprofen (ADVIL,MOTRIN) 600 MG tablet Take 1 tablet (600 mg total) by mouth every 6 (six) hours. 11/25/13  Yes Brock Bad, MD  omeprazole (PRILOSEC) 20 MG capsule Take 20 mg by mouth 3 (three) times daily.   Yes Historical Provider, MD  oxyCODONE-acetaminophen (PERCOCET/ROXICET) 5-325 MG per tablet Take 1 tablet by mouth every 6 (six) hours as needed. 01/06/14  Yes Gwyneth Sprout, MD  Prenatal Vit-Fe Fumarate-FA (PRENATAL MULTIVITAMIN) TABS tablet Take 1 tablet by mouth daily.   Yes Historical Provider, MD  sertraline (ZOLOFT) 50 MG tablet Take 1 tablet (50 mg total) by mouth daily. 12/05/13  Yes Jean Rosenthal, NP  docusate sodium (COLACE) 100 MG capsule Take 2 capsules (200 mg total) by mouth daily. 01/10/14   Sherrie George, PA-C  medroxyPROGESTERone (DEPO-PROVERA) 150 MG/ML injection Inject 1 mL (150 mg total) into the muscle every 3 (three) months. 01/09/14   Brock Bad, MD  methylcellulose (CITRUCEL) oral powder Take 1 packet by mouth daily. 01/10/14   Sherrie George, PA-C  oxyCODONE (OXY IR/ROXICODONE) 5 MG immediate release tablet Take 1-2 tablets (5-10 mg total) by mouth every 4 (four) hours as needed for moderate pain. 01/10/14   Sherrie George, PA-C     Results for orders placed in visit on 01/09/14 (from the past 48 hour(s))  POCT URINALYSIS DIPSTICK     Status: None   Collection Time    01/09/14  1:27 PM  Result Value Range   Color, UA yellow     Clarity, UA cloudy     Glucose, UA 1+     Bilirubin, UA negative     Ketones, UA negative     Spec Grav, UA 1.015     Blood, UA 3+     pH, UA 5.0     Protein, UA 1+     Urobilinogen, UA negative     Nitrite, UA negative     Leukocytes, UA Negative      No results found.  Review of Systems  Constitutional: Negative for fever and chills.  Gastrointestinal: Positive for constipation. Negative for abdominal pain.  Musculoskeletal: Positive for back pain.   Blood pressure 108/66, pulse 88,  temperature 98.5 F (36.9 C), temperature source Oral, resp. rate 18, SpO2 100.00%. Physical Exam  Constitutional: She appears well-developed and well-nourished.  She is uncomfortable.  Genitourinary:  Firm tender right anterior hemorrhoid. No obvious fissure.  Having some vaginal bleeding due to her menstrual cycle.    Assessment/Plan: Persistently thrombosed right anterior external hemorrhoid.  Plan: Excision of external hemorrhoid under local anesthesia. We will then give her discharge instructions and see her back in the office in approximately 3 weeks.  Procedure: The perianal area was sterilely prepped and draped. The base of the thrombosed external hemorrhoid was anesthetized with using Xylocaine with epinephrine. I then sharply excised the external hemorrhoid. Bleeding was controlled with electrocautery and pressure. Once hemostasis was adequate, a bulky dressing was applied to the anal area. She tolerated the procedure well.  Paula Dominguez J 01/10/2014, 3:11 PM

## 2014-01-10 NOTE — ED Notes (Addendum)
This RN chaperoned hemmorhoid exam performed by Zola ButtonWill Jennings, PA and Avel Peaceodd Rosenbower, MD.

## 2014-01-10 NOTE — ED Notes (Signed)
MD at bedside. 

## 2014-01-10 NOTE — Discharge Instructions (Signed)
[facility] _______[facility]  RECTAL SURGERY POST OP INSTRUCTIONS: POST OP INSTRUCTIONS  Always review your discharge instruction sheet given to you by the facility where your surgery was performed. IF YOU HAVE DISABILITY OR FAMILY LEAVE FORMS, YOU MUST BRING THEM TO THE OFFICE FOR PROCESSING.   DO NOT GIVE THEM TO YOUR DOCTOR.  1. A  prescription for pain medication may be given to you upon discharge.  Take your pain medication as prescribed, if needed.  If narcotic pain medicine is not needed, then you may take acetaminophen (Tylenol) or ibuprofen (Advil) as needed. 2. Take your usually prescribed medications unless otherwise directed. 3. If you need a refill on your pain medication, please contact your pharmacy.  They will contact our office to request authorization. Prescriptions will not be filled after 5 pm or on week-ends. 4. You should follow a light diet the first 48 hours after arrival home, such as soup and crackers, etc.  Be sure to include lots of fluids daily.  Resume your normal diet 2-3 days after surgery.. 5. Most patients will experience some swelling and discomfort in the rectal area. Ice packs, reclining and warm tub soaks will help.  Swelling and discomfort can take several days to resolve.  6. It is common to experience some constipation if taking pain medication after surgery.  Increasing fluid intake and taking a stool softener (such as Colace) will usually help or prevent this problem from occurring.  A mild laxative (Milk of Magnesia or Miralax) should be taken according to package directions if there are no bowel movements after 48 hours. 7. Unless discharge instructions indicate otherwise, leave your bandage dry and in place for 24 hours, or remove the bandage if you have a bowel movement. You may notice a small amount of bleeding with bowel movements for the first few days. You may have some packing in the rectum which will come out over the first day or two. You  will need to wear an absorbent pad or soft cotton gauze in your underwear until the drainage stops.it. 8. ACTIVITIES:  You may resume regular (light) daily activities beginning the next day--such as daily self-care, walking, climbing stairs--gradually increasing activities as tolerated.  You may have sexual intercourse when it is comfortable.  Refrain from any heavy lifting or straining until approved by your doctor. a. You may drive when you are no longer taking prescription pain medication, you can comfortably wear a seatbelt, and you can safely maneuver your car and apply brakes. b. RETURN TO WORK: : ____________________ c.  9. You should see your doctor in the office for a follow-up appointment approximately 2-3 weeks after your surgery.  Make sure that you call for this appointment within a day or two after you arrive home to insure a convenient appointment time. 10. OTHER INSTRUCTIONS:  __________________________________________________________________________________________________________________________________________________________________________________________  WHEN TO CALL YOUR DOCTOR: 1. Fever over 101.0 2. Inability to urinate 3. Nausea and/or vomiting 4. Extreme swelling or bruising 5. Continued bleeding from rectum. 6. Increased pain, redness, or drainage from the incision 7. Constipation  The clinic staff is available to answer your questions during regular business hours.  Please dont hesitate to call and ask to speak to one of the nurses for clinical concerns.  If you have a medical emergency, go to the nearest emergency room or call 911.  [facility] is always on call at the hospital CAll for heavy bleeding. You can return to work in 1 week.  [address], [address], [place], [place]  [address] ? ° [address] [address], [place], [place]   [address] °[phone] ? [phone] ? FAX [phone] °Web site: [email or web address] ° °

## 2014-01-10 NOTE — Consult Note (Signed)
Reason for Consult: hemorrhoids Referring Physician: Benjiman Core  Paula Dominguez is an 33 y.o. female.  HPI: 33 y/o with hemorrhoids since her delivery 11/20/13.  She had an I&D of thrombosed hemorrhoid 01/06/14.  She returns to the hospital with uncontrolled discomfort and we are ask to see.  Past Medical History  Diagnosis Date  . Hypertension   . Anxiety and depression   . Hx of preeclampsia, prior pregnancy,     prior pregnancy    Past Surgical History  Procedure Laterality Date  . No past surgeries      Family History  Problem Relation Age of Onset  . Hypertension Other   . Cancer Other     Social History:  reports that she has been smoking Cigarettes.  She has a 17 pack-year smoking history. She has never used smokeless tobacco. She reports that she does not drink alcohol or use illicit drugs.  Allergies: No Known Allergies  Prior to Admission medications   Medication Sig Start Date End Date Taking? Authorizing Provider  ALPRAZolam Prudy Feeler) 0.5 MG tablet Take 1 tablet (0.5 mg total) by mouth 2 (two) times daily as needed for anxiety or sleep. 01/09/14  Yes Brock Bad, MD  benazepril-hydrochlorthiazide (LOTENSIN HCT) 20-25 MG per tablet Take 2 tablets by mouth daily.   Yes Historical Provider, MD  cloNIDine (CATAPRES) 0.2 MG tablet Take 1 tablet (0.2 mg total) by mouth 2 (two) times daily. 11/25/13  Yes Brock Bad, MD  docusate sodium (COLACE) 100 MG capsule Take 1 capsule (100 mg total) by mouth every 12 (twelve) hours. 01/09/14  Yes Celene Kras, MD  hydrocortisone (ANUSOL-HC) 2.5 % rectal cream Apply rectally 2 times daily 01/09/14  Yes Celene Kras, MD  ibuprofen (ADVIL,MOTRIN) 600 MG tablet Take 1 tablet (600 mg total) by mouth every 6 (six) hours. 11/25/13  Yes Brock Bad, MD  omeprazole (PRILOSEC) 20 MG capsule Take 20 mg by mouth 3 (three) times daily.   Yes Historical Provider, MD  oxyCODONE-acetaminophen (PERCOCET/ROXICET) 5-325 MG per tablet Take 1  tablet by mouth every 6 (six) hours as needed. 01/06/14  Yes Gwyneth Sprout, MD  Prenatal Vit-Fe Fumarate-FA (PRENATAL MULTIVITAMIN) TABS tablet Take 1 tablet by mouth daily.   Yes Historical Provider, MD  sertraline (ZOLOFT) 50 MG tablet Take 1 tablet (50 mg total) by mouth daily. 12/05/13  Yes Jean Rosenthal, NP  medroxyPROGESTERone (DEPO-PROVERA) 150 MG/ML injection Inject 1 mL (150 mg total) into the muscle every 3 (three) months. 01/09/14   Brock Bad, MD      Results for orders placed in visit on 01/09/14 (from the past 48 hour(s))  POCT URINALYSIS DIPSTICK     Status: None   Collection Time    01/09/14  1:27 PM      Result Value Range   Color, UA yellow     Clarity, UA cloudy     Glucose, UA 1+     Bilirubin, UA negative     Ketones, UA negative     Spec Grav, UA 1.015     Blood, UA 3+     pH, UA 5.0     Protein, UA 1+     Urobilinogen, UA negative     Nitrite, UA negative     Leukocytes, UA Negative      No results found.  Review of Systems  All other systems reviewed and are negative.   Blood pressure 108/66, pulse 88, temperature 98.5 F (36.9 C),  temperature source Oral, resp. rate 18, SpO2 100.00%. Physical Exam  Constitutional: She is oriented to person, place, and time. She appears well-developed and well-nourished.  Sedated, crying now.  HENT:  Head: Normocephalic and atraumatic.  Neck: Normal range of motion. Neck supple. No JVD present. No tracheal deviation present. No thyromegaly present.  Cardiovascular: Normal rate, regular rhythm and normal heart sounds.   Respiratory: Effort normal and breath sounds normal. No respiratory distress. She has no wheezes. She has no rales. She exhibits no tenderness.  GI: Soft. Bowel sounds are normal. She exhibits no distension and no mass. There is no tenderness. There is no rebound and no guarding.  Genitourinary:  Right anterior hemorrhoid   Musculoskeletal: She exhibits no edema.  Neurological: She is  alert and oriented to person, place, and time.  Skin: Skin is warm and dry. No rash noted. No erythema. No pallor.  Psychiatric: She has a normal mood and affect. Her behavior is normal. Judgment and thought content normal.    Assessment/Plan: Right anterior hemorrhoid Post partum depression Hypertension  Plan:  Bedside hemorrhoidectomy.  Dr. Abbey Chattersosenbower discussed care and she wanted to have it removed today.  Dixon Luczak 01/10/2014, 2:32 PM

## 2014-01-10 NOTE — ED Notes (Signed)
Per pt, has been having trouble with hemorrhoids since giving birth in Nov.-was seen in ED last night and referred to surgery center-was told surgeon was not there and told to come back to ED to have them removed

## 2014-01-12 ENCOUNTER — Emergency Department (HOSPITAL_COMMUNITY)
Admission: EM | Admit: 2014-01-12 | Discharge: 2014-01-13 | Disposition: A | Discharge status: Home / Self Care | Payer: Medicaid Other | Attending: Emergency Medicine | Admitting: Emergency Medicine

## 2014-01-12 ENCOUNTER — Encounter (HOSPITAL_COMMUNITY): Payer: Self-pay | Admitting: Emergency Medicine

## 2014-01-12 DIAGNOSIS — I10 Essential (primary) hypertension: Secondary | ICD-10-CM | POA: Insufficient documentation

## 2014-01-12 DIAGNOSIS — F341 Dysthymic disorder: Secondary | ICD-10-CM | POA: Insufficient documentation

## 2014-01-12 DIAGNOSIS — R197 Diarrhea, unspecified: Secondary | ICD-10-CM | POA: Insufficient documentation

## 2014-01-12 DIAGNOSIS — F17200 Nicotine dependence, unspecified, uncomplicated: Secondary | ICD-10-CM | POA: Insufficient documentation

## 2014-01-12 DIAGNOSIS — R109 Unspecified abdominal pain: Secondary | ICD-10-CM | POA: Insufficient documentation

## 2014-01-12 DIAGNOSIS — Z79899 Other long term (current) drug therapy: Secondary | ICD-10-CM | POA: Insufficient documentation

## 2014-01-12 DIAGNOSIS — R112 Nausea with vomiting, unspecified: Secondary | ICD-10-CM

## 2014-01-12 NOTE — ED Notes (Signed)
Patient states that she is having hot and cold chills, and she is having abdominal pain and Loose stools. The patient also reports that she recently had hemmroid surgery

## 2014-01-13 MED ORDER — ONDANSETRON 4 MG PO TBDP: 4.0000 mg | ORAL_TABLET | Freq: Three times a day (TID) | ORAL | Status: DC | PRN

## 2014-01-13 MED ORDER — ONDANSETRON 4 MG PO TBDP
4.0000 mg | ORAL_TABLET | Freq: Once | ORAL | Status: AC
  Administered 2014-01-13: 4 mg via ORAL
  Filled 2014-01-13: qty 1

## 2014-01-13 MED ORDER — SODIUM CHLORIDE 0.9 % IV BOLUS (SEPSIS)
1000.0000 mL | Freq: Once | INTRAVENOUS | Status: AC
  Administered 2014-01-13: 1000 mL via INTRAVENOUS

## 2014-01-13 MED ORDER — OXYCODONE HCL 5 MG PO TABS
5.0000 mg | ORAL_TABLET | Freq: Once | ORAL | Status: AC
  Administered 2014-01-13: 5 mg via ORAL
  Filled 2014-01-13: qty 1

## 2014-01-13 MED ORDER — MORPHINE SULFATE 4 MG/ML IJ SOLN
4.0000 mg | Freq: Once | INTRAMUSCULAR | Status: AC
  Administered 2014-01-13: 4 mg via INTRAVENOUS
  Filled 2014-01-13: qty 1

## 2014-01-13 NOTE — ED Provider Notes (Signed)
Medical screening examination/treatment/procedure(s) were performed by non-physician practitioner and as supervising physician I was immediately available for consultation/collaboration.  EKG Interpretation   None        Derwood KaplanAnkit Lyniah Fujita, MD 01/13/14 [phone]

## 2014-01-13 NOTE — Discharge Instructions (Signed)
Nausea and Vomiting Nausea is a sick feeling that often comes before throwing up (vomiting). Vomiting is a reflex where stomach contents come out of your mouth. Vomiting can cause severe loss of body fluids (dehydration). Children and elderly adults can become dehydrated quickly, especially if they also have diarrhea. Nausea and vomiting are symptoms of a condition or disease. It is important to find the cause of your symptoms. CAUSES   Direct irritation of the stomach lining. This irritation can result from increased acid production (gastroesophageal reflux disease), infection, food poisoning, taking certain medicines (such as nonsteroidal anti-inflammatory drugs), alcohol use, or tobacco use.  Signals from the brain.These signals could be caused by a headache, heat exposure, an inner ear disturbance, increased pressure in the brain from injury, infection, a tumor, or a concussion, pain, emotional stimulus, or metabolic problems.  An obstruction in the gastrointestinal tract (bowel obstruction).  Illnesses such as diabetes, hepatitis, gallbladder problems, appendicitis, kidney problems, cancer, sepsis, atypical symptoms of a heart attack, or eating disorders.  Medical treatments such as chemotherapy and radiation.  Receiving medicine that makes you sleep (general anesthetic) during surgery. DIAGNOSIS Your caregiver may ask for tests to be done if the problems do not improve after a few days. Tests may also be done if symptoms are severe or if the reason for the nausea and vomiting is not clear. Tests may include:  Urine tests.  Blood tests.  Stool tests.  Cultures (to look for evidence of infection).  X-rays or other imaging studies. Test results can help your caregiver make decisions about treatment or the need for additional tests. TREATMENT You need to stay well hydrated. Drink frequently but in small amounts.You may wish to drink water, sports drinks, clear broth, or eat frozen  ice pops or gelatin dessert to help stay hydrated.When you eat, eating slowly may help prevent nausea.There are also some antinausea medicines that may help prevent nausea. HOME CARE INSTRUCTIONS   Take all medicine as directed by your caregiver.  If you do not have an appetite, do not force yourself to eat. However, you must continue to drink fluids.  If you have an appetite, eat a normal diet unless your caregiver tells you differently.  Eat a variety of complex carbohydrates (rice, wheat, potatoes, bread), lean meats, yogurt, fruits, and vegetables.  Avoid high-fat foods because they are more difficult to digest.  Drink enough water and fluids to keep your urine clear or pale yellow.  If you are dehydrated, ask your caregiver for specific rehydration instructions. Signs of dehydration may include:  Severe thirst.  Dry lips and mouth.  Dizziness.  Dark urine.  Decreasing urine frequency and amount.  Confusion.  Rapid breathing or pulse. SEEK IMMEDIATE MEDICAL CARE IF:   You have blood or brown flecks (like coffee grounds) in your vomit.  You have black or bloody stools.  You have a severe headache or stiff neck.  You are confused.  You have severe abdominal pain.  You have chest pain or trouble breathing.  You do not urinate at least once every 8 hours.  You develop cold or clammy skin.  You continue to vomit for longer than 24 to 48 hours.  You have a fever. MAKE SURE YOU:   Understand these instructions.  Will watch your condition.  Will get help right away if you are not doing well or get worse. Document Released: 12/12/2005 Document Revised: 03/05/2012 Document Reviewed: 05/11/2011 [facility] Patient Information 2014 [place], [place]. Use the Zofran as needed.  For any further episodes of nausea and vomiting Try to eat lightly for the next several days

## 2014-01-13 NOTE — ED Provider Notes (Signed)
CSN: [id number]     Arrival date & time 01/12/14  2253 History   First MD Initiated Contact with Patient 01/13/14 0023     Chief Complaint  Patient presents with  . Emesis  . Abdominal Pain   (Consider location/radiation/quality/duration/timing/severity/associated sxs/prior Treatment) HPI Comments: This is a mother upset, and ill siblings, as well as her spells of nausea, vomiting, diarrhea, complicated factor.  For this woman, and she just had a thrombosed hemorrhoid removed, plus is 2 months postpartum.  She is not nursing.  She states she cannot tolerate any of her pain medicine.  Due to the nausea and vomiting  Patient is a 33 y.o. female presenting with vomiting and abdominal pain. The history is provided by the patient.  Emesis Severity:  Moderate Timing:  Intermittent Quality:  Bilious material Chronicity:  New Recent urination:  Decreased Relieved by:  None tried Worsened by:  Nothing tried Ineffective treatments:  None tried Associated symptoms: abdominal pain and diarrhea   Abdominal Pain Associated symptoms: diarrhea, nausea and vomiting     Past Medical History  Diagnosis Date  . Hypertension   . Anxiety and depression   . Hx of preeclampsia, prior pregnancy, currently pregnant     prior pregnancy   Past Surgical History  Procedure Laterality Date  . No past surgeries     Family History  Problem Relation Age of Onset  . Hypertension Other   . Cancer Other    History  Substance Use Topics  . Smoking status: Current Every Day Smoker -- 1.00 packs/day for 17 years    Types: Cigarettes    Last Attempt to Quit: 07/27/2013  . Smokeless tobacco: Never Used  . Alcohol Use: No   OB History   Grav Para Term Preterm Abortions TAB SAB Ect Mult Living   7 6 6  0 1  1   6      Review of Systems  Gastrointestinal: Positive for nausea, vomiting, abdominal pain and diarrhea.  All other systems reviewed and are negative.    Allergies  Review of patient's  allergies indicates no known allergies.  Home Medications   Current Outpatient Rx  Name  Route  Sig  Dispense  Refill  . ALPRAZolam (XANAX) 0.5 MG tablet   Oral   Take 1 tablet (0.5 mg total) by mouth 2 (two) times daily as needed for anxiety or sleep.   90 tablet   0   . benazepril-hydrochlorthiazide (LOTENSIN HCT) 20-25 MG per tablet   Oral   Take 2 tablets by mouth daily.         . cloNIDine (CATAPRES) 0.2 MG tablet   Oral   Take 1 tablet (0.2 mg total) by mouth 2 (two) times daily.   30 tablet   0   . docusate sodium (COLACE) 100 MG capsule   Oral   Take 2 capsules (200 mg total) by mouth daily.   60 capsule   0   . ibuprofen (ADVIL,MOTRIN) 600 MG tablet   Oral   Take 1 tablet (600 mg total) by mouth every 6 (six) hours.   30 tablet   5   . medroxyPROGESTERone (DEPO-PROVERA) 150 MG/ML injection   Intramuscular   Inject 1 mL (150 mg total) into the muscle every 3 (three) months.   1 mL   0   . methylcellulose (CITRUCEL) oral powder   Oral   Take 1 packet by mouth daily.   30 tablet   0   . omeprazole (PRILOSEC)  20 MG capsule   Oral   Take 20 mg by mouth 3 (three) times daily.         Marland Kitchen. oxyCODONE (OXY IR/ROXICODONE) 5 MG immediate release tablet   Oral   Take 1-2 tablets (5-10 mg total) by mouth every 4 (four) hours as needed for moderate pain.   50 tablet   0   . Prenatal Vit-Fe Fumarate-FA (PRENATAL MULTIVITAMIN) TABS tablet   Oral   Take 1 tablet by mouth daily.         . sertraline (ZOLOFT) 50 MG tablet   Oral   Take 1 tablet (50 mg total) by mouth daily.   30 tablet   0   . ondansetron (ZOFRAN ODT) 4 MG disintegrating tablet   Oral   Take 1 tablet (4 mg total) by mouth every 8 (eight) hours as needed for nausea or vomiting.   20 tablet   0    BP 114/65  Pulse 77  Temp(Src) 98.7 F (37.1 C) (Oral)  Resp 16  SpO2 98% Physical Exam  Nursing note and vitals reviewed. Constitutional: She is oriented to person, place, and time.  She appears well-developed and well-nourished.  HENT:  Head: Normocephalic.  Eyes: Pupils are equal, round, and reactive to light.  Neck: Normal range of motion.  Cardiovascular: Normal rate and regular rhythm.   Pulmonary/Chest: Effort normal and breath sounds normal.  Abdominal: She exhibits no distension.  Musculoskeletal: Normal range of motion.  Neurological: She is alert and oriented to person, place, and time.  Skin: Skin is warm. No erythema.    ED Course  Procedures (including critical care time) Labs Review Labs Reviewed - No data to display Imaging Review No results found.  EKG Interpretation   None       MDM   1. Nausea and vomiting     This is been given 2 L of fluid.  Her blood pressure has normalized.  She is no longer tachycardic.  She is tolerating by mouth fluid, and her by mouth pain medication   Arman FilterGail K Brahm Barbeau, NP 01/13/14 [phone]

## 2014-01-24 ENCOUNTER — Emergency Department (HOSPITAL_COMMUNITY)
Admission: EM | Admit: 2014-01-24 | Discharge: 2014-01-24 | Disposition: A | Discharge status: Home / Self Care | Payer: No Typology Code available for payment source | Attending: Emergency Medicine | Admitting: Emergency Medicine

## 2014-01-24 DIAGNOSIS — Z79899 Other long term (current) drug therapy: Secondary | ICD-10-CM | POA: Insufficient documentation

## 2014-01-24 DIAGNOSIS — F341 Dysthymic disorder: Secondary | ICD-10-CM | POA: Insufficient documentation

## 2014-01-24 DIAGNOSIS — S161XXA Strain of muscle, fascia and tendon at neck level, initial encounter: Secondary | ICD-10-CM

## 2014-01-24 DIAGNOSIS — S139XXA Sprain of joints and ligaments of unspecified parts of neck, initial encounter: Secondary | ICD-10-CM | POA: Insufficient documentation

## 2014-01-24 DIAGNOSIS — Y92410 Unspecified street and highway as the place of occurrence of the external cause: Secondary | ICD-10-CM | POA: Insufficient documentation

## 2014-01-24 DIAGNOSIS — M545 Low back pain, unspecified: Secondary | ICD-10-CM | POA: Insufficient documentation

## 2014-01-24 DIAGNOSIS — G44209 Tension-type headache, unspecified, not intractable: Secondary | ICD-10-CM | POA: Insufficient documentation

## 2014-01-24 DIAGNOSIS — Y9389 Activity, other specified: Secondary | ICD-10-CM | POA: Insufficient documentation

## 2014-01-24 DIAGNOSIS — I10 Essential (primary) hypertension: Secondary | ICD-10-CM | POA: Insufficient documentation

## 2014-01-24 DIAGNOSIS — F17200 Nicotine dependence, unspecified, uncomplicated: Secondary | ICD-10-CM | POA: Insufficient documentation

## 2014-01-24 MED ORDER — NAPROXEN 500 MG PO TABS: 500.0000 mg | ORAL_TABLET | Freq: Two times a day (BID) | ORAL | Status: DC

## 2014-01-24 MED ORDER — DIAZEPAM 5 MG PO TABS: 5.0000 mg | ORAL_TABLET | Freq: Two times a day (BID) | ORAL | Status: DC

## 2014-01-24 MED ORDER — NAPROXEN 500 MG PO TABS
500.0000 mg | ORAL_TABLET | Freq: Once | ORAL | Status: AC
  Administered 2014-01-24: 500 mg via ORAL
  Filled 2014-01-24: qty 1
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
## 2014-01-24 NOTE — Discharge Instructions (Signed)
Cervical Sprain °A cervical sprain is when the tissues (ligaments) that hold the neck bones in place stretch or tear. °HOME CARE  °· Put ice on the injured area. °· Put ice in a plastic bag. °· Place a towel between your skin and the bag. °· Leave the ice on for 15 20 minutes, 3 4 times a day. °· You may have been given a collar to wear. This collar keeps your neck from moving while you heal. °· Do not take the collar off unless told by your doctor. °· If you have long hair, keep it outside of the collar. °· Ask your doctor before changing the position of your collar. You may need to change its position over time to make it more comfortable. °· If you are allowed to take off the collar for cleaning or bathing, follow your doctor's instructions on how to do it safely. °· Keep your collar clean by wiping it with mild soap and water. Dry it completely. If the collar has removable pads, remove them every 1 2 days to hand wash them with soap and water. Allow them to air dry. They should be dry before you wear them in the collar. °· Do not drive while wearing the collar. °· Only take medicine as told by your doctor. °· Keep all doctor visits as told. °· Keep all physical therapy visits as told. °· Adjust your work station so that you have good posture while you work. °· Avoid positions and activities that make your problems worse. °· Warm up and stretch before being active. °GET HELP IF: °· Your pain is not controlled with medicine. °· You cannot take less pain medicine over time as planned. °· Your activity level does not improve as expected. °GET HELP RIGHT AWAY IF:  °· You are bleeding. °· Your stomach is upset. °· You have an allergic reaction to your medicine. °· You develop new problems that you cannot explain. °· You lose feeling (become numb) or you cannot move any part of your body (paralysis). °· You have tingling or weakness in any part of your body. °· Your symptoms get worse. Symptoms include: °· Pain,  soreness, stiffness, puffiness (swelling), or a burning feeling in your neck. °· Pain when your neck is touched. °· Shoulder or upper back pain. °· Limited ability to move your neck. °· Headache. °· Dizziness. °· Your hands or arms feel week, lose feeling, or tingle. °· Muscle spasms. °· Difficulty swallowing or chewing. °MAKE SURE YOU:  °· Understand these instructions. °· Will watch your condition. °· Will get help right away if you are not doing well or get worse. °Document Released: 05/30/2008 Document Revised: 08/14/2013 Document Reviewed: 06/19/2013 °Exit[employer]® Patient Information ©2014 [employer]. ° °Back Pain, Adult °Back pain is very common. The pain often gets better over time. The cause of back pain is usually not dangerous. Most people can learn to manage their back pain on their own.  °HOME CARE  °· Stay active. Start with short walks on flat ground if you can. Try to walk farther each day. °· Do not sit, drive, or stand in one place for more than 30 minutes. Do not stay in bed. °· Do not avoid exercise or work. Activity can help your back heal faster. °· Be careful when you bend or lift an object. Bend at your knees, keep the object close to you, and do not twist. °· Sleep on a firm mattress. Lie on your side, and bend your   knees. If you lie on your back, put a pillow under your knees. °· Only take medicines as told by your doctor. °· Put ice on the injured area. °· Put ice in a plastic bag. °· Place a towel between your skin and the bag. °· Leave the ice on for 15-20 minutes, 03-04 times a day for the first 2 to 3 days. After that, you can switch between ice and heat packs. °· Ask your doctor about back exercises or massage. °· Avoid feeling anxious or stressed. Find good ways to deal with stress, such as exercise. °GET HELP RIGHT AWAY IF:  °· Your pain does not go away with rest or medicine. °· Your pain does not go away in 1 week. °· You have new problems. °· You do not feel well. °· The pain  spreads into your legs. °· You cannot control when you poop (bowel movement) or pee (urinate). °· Your arms or legs feel weak or lose feeling (numbness). °· You feel sick to your stomach (nauseous) or throw up (vomit). °· You have belly (abdominal) pain. °· You feel like you may pass out (faint). °MAKE SURE YOU:  °· Understand these instructions. °· Will watch your condition. °· Will get help right away if you are not doing well or get worse. °Document Released: 05/30/2008 Document Revised: 03/05/2012 Document Reviewed: 05/02/2011 °ExitCare® Patient Information ©2014 [employer]. ° °
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
## 2014-01-25 NOTE — ED Provider Notes (Signed)
Medical screening examination/treatment/procedure(s) were performed by non-physician practitioner and as supervising physician I was immediately available for consultation/collaboration.  EKG Interpretation   None         Nakya Weyand N Raphaela Cannaday, DO 01/25/14 0001 

## 2014-01-27 ENCOUNTER — Encounter (HOSPITAL_COMMUNITY): Payer: Self-pay | Admitting: Emergency Medicine

## 2014-01-27 ENCOUNTER — Emergency Department (HOSPITAL_COMMUNITY)
Admission: EM | Admit: 2014-01-27 | Discharge: 2014-01-27 | Discharge status: Against Medical Advice | Payer: No Typology Code available for payment source | Attending: Emergency Medicine | Admitting: Emergency Medicine

## 2014-01-27 DIAGNOSIS — F17200 Nicotine dependence, unspecified, uncomplicated: Secondary | ICD-10-CM | POA: Insufficient documentation

## 2014-01-27 DIAGNOSIS — I10 Essential (primary) hypertension: Secondary | ICD-10-CM | POA: Insufficient documentation

## 2014-01-27 DIAGNOSIS — G8911 Acute pain due to trauma: Secondary | ICD-10-CM | POA: Insufficient documentation

## 2014-01-27 DIAGNOSIS — M79609 Pain in unspecified limb: Secondary | ICD-10-CM | POA: Insufficient documentation

## 2014-01-27 DIAGNOSIS — M549 Dorsalgia, unspecified: Secondary | ICD-10-CM | POA: Insufficient documentation
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
## 2014-01-28 ENCOUNTER — Encounter (HOSPITAL_COMMUNITY): Payer: Self-pay | Admitting: Emergency Medicine

## 2014-01-28 ENCOUNTER — Emergency Department (HOSPITAL_COMMUNITY)
Admission: EM | Admit: 2014-01-28 | Discharge: 2014-01-28 | Disposition: A | Discharge status: Home / Self Care | Payer: No Typology Code available for payment source | Attending: Emergency Medicine | Admitting: Emergency Medicine

## 2014-01-28 DIAGNOSIS — F329 Major depressive disorder, single episode, unspecified: Secondary | ICD-10-CM | POA: Insufficient documentation

## 2014-01-28 DIAGNOSIS — F411 Generalized anxiety disorder: Secondary | ICD-10-CM | POA: Insufficient documentation

## 2014-01-28 DIAGNOSIS — M545 Low back pain, unspecified: Secondary | ICD-10-CM | POA: Insufficient documentation

## 2014-01-28 DIAGNOSIS — M79604 Pain in right leg: Secondary | ICD-10-CM

## 2014-01-28 DIAGNOSIS — R51 Headache: Secondary | ICD-10-CM | POA: Insufficient documentation

## 2014-01-28 DIAGNOSIS — F17200 Nicotine dependence, unspecified, uncomplicated: Secondary | ICD-10-CM | POA: Insufficient documentation

## 2014-01-28 DIAGNOSIS — M25569 Pain in unspecified knee: Secondary | ICD-10-CM | POA: Insufficient documentation

## 2014-01-28 DIAGNOSIS — I10 Essential (primary) hypertension: Secondary | ICD-10-CM | POA: Insufficient documentation

## 2014-01-28 DIAGNOSIS — M542 Cervicalgia: Secondary | ICD-10-CM

## 2014-01-28 DIAGNOSIS — M79601 Pain in right arm: Secondary | ICD-10-CM

## 2014-01-28 DIAGNOSIS — Z79899 Other long term (current) drug therapy: Secondary | ICD-10-CM | POA: Insufficient documentation

## 2014-01-28 DIAGNOSIS — G8911 Acute pain due to trauma: Secondary | ICD-10-CM | POA: Insufficient documentation

## 2014-01-28 DIAGNOSIS — F3289 Other specified depressive episodes: Secondary | ICD-10-CM | POA: Insufficient documentation

## 2014-01-28 DIAGNOSIS — H109 Unspecified conjunctivitis: Secondary | ICD-10-CM

## 2014-01-28 DIAGNOSIS — Z791 Long term (current) use of non-steroidal anti-inflammatories (NSAID): Secondary | ICD-10-CM | POA: Insufficient documentation

## 2014-01-28 DIAGNOSIS — M79641 Pain in right hand: Secondary | ICD-10-CM

## 2014-01-28 DIAGNOSIS — M25539 Pain in unspecified wrist: Secondary | ICD-10-CM | POA: Insufficient documentation

## 2014-01-28 MED ORDER — ERYTHROMYCIN 5 MG/GM OP OINT: TOPICAL_OINTMENT | OPHTHALMIC | Status: DC

## 2014-01-28 MED ORDER — NAPHAZOLINE-PHENIRAMINE 0.025-0.3 % OP SOLN: 1.0000 [drp] | OPHTHALMIC | Status: DC | PRN

## 2014-01-28 MED ORDER — FLUORESCEIN SODIUM 1 MG OP STRP
1.0000 | ORAL_STRIP | Freq: Once | OPHTHALMIC | Status: AC
  Administered 2014-01-28: 1 via OPHTHALMIC
  Filled 2014-01-28: qty 1

## 2014-01-28 MED ORDER — TETRACAINE HCL 0.5 % OP SOLN
1.0000 [drp] | Freq: Once | OPHTHALMIC | Status: AC
  Administered 2014-01-28: 1 [drp] via OPHTHALMIC
  Filled 2014-01-28: qty 2
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
## 2014-01-29 NOTE — ED Provider Notes (Signed)
Medical screening examination/treatment/procedure(s) were performed by non-physician practitioner and as supervising physician I was immediately available for consultation/collaboration.    Drayton Tieu R Ajani Schnieders, MD 01/29/14 2338 

## 2014-02-06 ENCOUNTER — Ambulatory Visit: Payer: Medicaid Other | Admitting: Obstetrics

## 2014-02-28 ENCOUNTER — Encounter (HOSPITAL_COMMUNITY): Payer: Self-pay | Admitting: Emergency Medicine

## 2014-02-28 ENCOUNTER — Emergency Department (HOSPITAL_COMMUNITY)
Admission: EM | Admit: 2014-02-28 | Discharge: 2014-02-28 | Disposition: A | Discharge status: Home / Self Care | Payer: Medicaid Other | Attending: Emergency Medicine | Admitting: Emergency Medicine

## 2014-02-28 DIAGNOSIS — M79604 Pain in right leg: Secondary | ICD-10-CM

## 2014-02-28 DIAGNOSIS — F17200 Nicotine dependence, unspecified, uncomplicated: Secondary | ICD-10-CM | POA: Insufficient documentation

## 2014-02-28 DIAGNOSIS — Z791 Long term (current) use of non-steroidal anti-inflammatories (NSAID): Secondary | ICD-10-CM | POA: Insufficient documentation

## 2014-02-28 DIAGNOSIS — Z792 Long term (current) use of antibiotics: Secondary | ICD-10-CM | POA: Insufficient documentation

## 2014-02-28 DIAGNOSIS — F329 Major depressive disorder, single episode, unspecified: Secondary | ICD-10-CM | POA: Insufficient documentation

## 2014-02-28 DIAGNOSIS — M79605 Pain in left leg: Secondary | ICD-10-CM

## 2014-02-28 DIAGNOSIS — Z79899 Other long term (current) drug therapy: Secondary | ICD-10-CM | POA: Insufficient documentation

## 2014-02-28 DIAGNOSIS — I10 Essential (primary) hypertension: Secondary | ICD-10-CM | POA: Insufficient documentation

## 2014-02-28 DIAGNOSIS — F3289 Other specified depressive episodes: Secondary | ICD-10-CM | POA: Insufficient documentation

## 2014-02-28 DIAGNOSIS — M79609 Pain in unspecified limb: Secondary | ICD-10-CM | POA: Insufficient documentation

## 2014-02-28 DIAGNOSIS — Z8742 Personal history of other diseases of the female genital tract: Secondary | ICD-10-CM | POA: Insufficient documentation

## 2014-02-28 DIAGNOSIS — F411 Generalized anxiety disorder: Secondary | ICD-10-CM | POA: Insufficient documentation

## 2014-02-28 NOTE — ED Notes (Signed)
Pt states that leg pain started the 3rd of this month. C/o of hard time sleeping due to the pain, sometimes it starts in her lower abd and pain radiates to feet.  Pt states that she had baby in November, hemorroid surgery twice in Jan. [facility] and states she is under a lot of stress.  Pt states she stands on her feet 10 hours a day while at work.  Pt also states that at work they checked her BP and elevated so her work sent her here to be evaluated and she cant return until we release her medically.
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
## 2014-02-28 NOTE — ED Provider Notes (Signed)
Medical screening examination/treatment/procedure(s) were performed by non-physician practitioner and as supervising physician I was immediately available for consultation/collaboration.   EKG Interpretation None       Andersen Mckiver, MD 02/28/14 0838 

## 2014-02-28 NOTE — Discharge Instructions (Signed)
Musculoskeletal Pain Musculoskeletal pain is muscle and boney aches and pains. These pains can occur in any part of the body. Your caregiver may treat you without knowing the cause of the pain. They may treat you if blood or urine tests, X-rays, and other tests were normal.  CAUSES There is often not a definite cause or reason for these pains. These pains may be caused by a type of germ (virus). The discomfort may also come from overuse. Overuse includes working out too hard when your body is not fit. Boney aches also come from weather changes. Bone is sensitive to atmospheric pressure changes. HOME CARE INSTRUCTIONS   Ask when your test results will be ready. Make sure you get your test results.  Only take over-the-counter or prescription medicines for pain, discomfort, or fever as directed by your caregiver. If you were given medications for your condition, do not drive, operate machinery or power tools, or sign legal documents for 24 hours. Do not drink alcohol. Do not take sleeping pills or other medications that may interfere with treatment.  Continue all activities unless the activities cause more pain. When the pain lessens, slowly resume normal activities. Gradually increase the intensity and duration of the activities or exercise.  During periods of severe pain, bed rest may be helpful. Lay or sit in any position that is comfortable.  Putting ice on the injured area.  Put ice in a bag.  Place a towel between your skin and the bag.  Leave the ice on for 15 to 20 minutes, 3 to 4 times a day.  Follow up with your caregiver for continued problems and no reason can be found for the pain. If the pain becomes worse or does not go away, it may be necessary to repeat tests or do additional testing. Your caregiver may need to look further for a possible cause. SEEK IMMEDIATE MEDICAL CARE IF:  You have pain that is getting worse and is not relieved by medications.  You develop chest pain  that is associated with shortness or breath, sweating, feeling sick to your stomach (nauseous), or throw up (vomit).  Your pain becomes localized to the abdomen.  You develop any new symptoms that seem different or that concern you. MAKE SURE YOU:   Understand these instructions.  Will watch your condition.  Will get help right away if you are not doing well or get worse. Document Released: 12/12/2005 Document Revised: 03/05/2012 Document Reviewed: 08/16/2013 [facility] Patient Information 2014 [place], [place].  Managing Your High Blood Pressure Blood pressure is a measurement of how forceful your blood is pressing against the walls of the arteries. Arteries are muscular tubes within the circulatory system. Blood pressure does not stay the same. Blood pressure rises when you are active, excited, or nervous; and it lowers during sleep and relaxation. If the numbers measuring your blood pressure stay above normal most of the time, you are at risk for health problems. High blood pressure (hypertension) is a long-term (chronic) condition in which blood pressure is elevated. A blood pressure reading is recorded as two numbers, such as 120 over 80 (or 120/80). The first, higher number is called the systolic pressure. It is a measure of the pressure in your arteries as the heart beats. The second, lower number is called the diastolic pressure. It is a measure of the pressure in your arteries as the heart relaxes between beats.  Keeping your blood pressure in a normal range is important to your overall health and  prevention of health problems, such as heart disease and stroke. When your blood pressure is uncontrolled, your heart has to work harder than normal. High blood pressure is a very common condition in adults because blood pressure tends to rise with age. Men and women are equally likely to have hypertension but at different times in life. Before age 68, men are more likely to have hypertension. After  34 years of age, women are more likely to have it. Hypertension is especially common in African Americans. This condition often has no signs or symptoms. The cause of the condition is usually not known. Your caregiver can help you come up with a plan to keep your blood pressure in a normal, healthy range. BLOOD PRESSURE STAGES Blood pressure is classified into four stages: normal, prehypertension, stage 1, and stage 2. Your blood pressure reading will be used to determine what type of treatment, if any, is necessary. Appropriate treatment options are tied to these four stages:  Normal  Systolic pressure (mm Hg): below 120.  Diastolic pressure (mm Hg): below 80. Prehypertension  Systolic pressure (mm Hg): 120 to 139.  Diastolic pressure (mm Hg): 80 to 89. Stage1  Systolic pressure (mm Hg): 140 to 159.  Diastolic pressure (mm Hg): 90 to 99. Stage2  Systolic pressure (mm Hg): 160 or above.  Diastolic pressure (mm Hg): 100 or above. RISKS RELATED TO HIGH BLOOD PRESSURE Managing your blood pressure is an important responsibility. Uncontrolled high blood pressure can lead to:  A heart attack.  A stroke.  A weakened blood vessel (aneurysm).  Heart failure.  Kidney damage.  Eye damage.  Metabolic syndrome.  Memory and concentration problems. HOW TO MANAGE YOUR BLOOD PRESSURE Blood pressure can be managed effectively with lifestyle changes and medicines (if needed). Your caregiver will help you come up with a plan to bring your blood pressure within a normal range. Your plan should include the following: Education  Read all information provided by your caregivers about how to control blood pressure.  Educate yourself on the latest guidelines and treatment recommendations. New research is always being done to further define the risks and treatments for high blood pressure. Lifestylechanges  Control your weight.  Avoid smoking.  Stay physically active.  Reduce the  amount of salt in your diet.  Reduce stress.  Control any chronic conditions, such as high cholesterol or diabetes.  Reduce your alcohol intake. Medicines  Several medicines (antihypertensive medicines) are available, if needed, to bring blood pressure within a normal range. Communication  Review all the medicines you take with your caregiver because there may be side effects or interactions.  Talk with your caregiver about your diet, exercise habits, and other lifestyle factors that may be contributing to high blood pressure.  See your caregiver regularly. Your caregiver can help you create and adjust your plan for managing high blood pressure. RECOMMENDATIONS FOR TREATMENT AND FOLLOW-UP  The following recommendations are based on current guidelines for managing high blood pressure in nonpregnant adults. Use these recommendations to identify the proper follow-up period or treatment option based on your blood pressure reading. You can discuss these options with your caregiver.  Systolic pressure of 120 to 139 or diastolic pressure of 80 to 89: Follow up with your caregiver as directed.  Systolic pressure of 140 to 160 or diastolic pressure of 90 to 100: Follow up with your caregiver within 2 months.  Systolic pressure above 160 or diastolic pressure above 100: Follow up with your caregiver within 1 month.  Systolic pressure above 180 or diastolic pressure above 110: Consider antihypertensive therapy; follow up with your caregiver within 1 week.  Systolic pressure above 200 or diastolic pressure above 120: Begin antihypertensive therapy; follow up with your caregiver within 1 week. Document Released: 09/05/2012 Document Reviewed: 09/05/2012 [facility] Patient Information 2014 [place], [place].

## 2014-04-06 ENCOUNTER — Other Ambulatory Visit: Payer: Self-pay | Admitting: Obstetrics

## 2014-04-11 NOTE — Telephone Encounter (Signed)
RX REFILL SENT TO PHARMACY. 

## 2014-05-18 ENCOUNTER — Emergency Department (HOSPITAL_COMMUNITY)
Admission: EM | Admit: 2014-05-18 | Discharge: 2014-05-18 | Disposition: A | Discharge status: Home / Self Care | Payer: Self-pay | Attending: Emergency Medicine | Admitting: Emergency Medicine

## 2014-05-18 ENCOUNTER — Emergency Department (HOSPITAL_COMMUNITY): Payer: Self-pay

## 2014-05-18 ENCOUNTER — Emergency Department (HOSPITAL_COMMUNITY): Payer: Medicaid Other

## 2014-05-18 ENCOUNTER — Encounter (HOSPITAL_COMMUNITY): Payer: Self-pay | Admitting: Emergency Medicine

## 2014-05-18 DIAGNOSIS — F3289 Other specified depressive episodes: Secondary | ICD-10-CM | POA: Insufficient documentation

## 2014-05-18 DIAGNOSIS — F29 Unspecified psychosis not due to a substance or known physiological condition: Secondary | ICD-10-CM | POA: Insufficient documentation

## 2014-05-18 DIAGNOSIS — R4701 Aphasia: Secondary | ICD-10-CM | POA: Insufficient documentation

## 2014-05-18 DIAGNOSIS — Z3202 Encounter for pregnancy test, result negative: Secondary | ICD-10-CM | POA: Insufficient documentation

## 2014-05-18 DIAGNOSIS — F329 Major depressive disorder, single episode, unspecified: Secondary | ICD-10-CM | POA: Insufficient documentation

## 2014-05-18 DIAGNOSIS — F411 Generalized anxiety disorder: Secondary | ICD-10-CM | POA: Insufficient documentation

## 2014-05-18 DIAGNOSIS — Z79899 Other long term (current) drug therapy: Secondary | ICD-10-CM | POA: Insufficient documentation

## 2014-05-18 DIAGNOSIS — F17200 Nicotine dependence, unspecified, uncomplicated: Secondary | ICD-10-CM | POA: Insufficient documentation

## 2014-05-18 DIAGNOSIS — I10 Essential (primary) hypertension: Secondary | ICD-10-CM | POA: Insufficient documentation

## 2014-05-18 DIAGNOSIS — H538 Other visual disturbances: Secondary | ICD-10-CM | POA: Insufficient documentation

## 2014-05-18 DIAGNOSIS — R4789 Other speech disturbances: Secondary | ICD-10-CM | POA: Insufficient documentation

## 2014-05-18 LAB — URINALYSIS, ROUTINE W REFLEX MICROSCOPIC
Bilirubin Urine: NEGATIVE
GLUCOSE, UA: NEGATIVE mg/dL
HGB URINE DIPSTICK: NEGATIVE
Ketones, ur: NEGATIVE mg/dL
LEUKOCYTES UA: NEGATIVE
Nitrite: NEGATIVE
Protein, ur: NEGATIVE mg/dL
Specific Gravity, Urine: 1.005 (ref 1.005–1.030)
Urobilinogen, UA: 0.2 mg/dL (ref 0.0–1.0)
pH: 7 (ref 5.0–8.0)

## 2014-05-18 LAB — BASIC METABOLIC PANEL
BUN: 6 mg/dL (ref 6–23)
CO2: 25 meq/L (ref 19–32)
Calcium: 9 mg/dL (ref 8.4–10.5)
Chloride: 104 mEq/L (ref 96–112)
Creatinine, Ser: 0.71 mg/dL (ref 0.50–1.10)
GFR calc Af Amer: >90 mL/min (ref >90)
GFR calc non Af Amer: >90 mL/min (ref >90)
GLUCOSE: 106 mg/dL — AB (ref 70–99)
Potassium: 3.8 mEq/L (ref 3.7–5.3)
SODIUM: 140 meq/L (ref 137–147)

## 2014-05-18 LAB — CBC
HCT: 35.7 % — ABNORMAL LOW (ref 36.0–46.0)
HEMOGLOBIN: 12 g/dL (ref 12.0–15.0)
MCH: 31.8 pg (ref 26.0–34.0)
MCHC: 33.6 g/dL (ref 30.0–36.0)
MCV: 94.7 fL (ref 78.0–100.0)
Platelets: 249 10*3/uL (ref 150–400)
RBC: 3.77 MIL/uL — AB (ref 3.87–5.11)
RDW: 13.3 % (ref 11.5–15.5)
WBC: 8.2 10*3/uL (ref 4.0–10.5)

## 2014-05-18 LAB — POC URINE PREG, ED: PREG TEST UR: NEGATIVE

## 2014-05-18 MED ORDER — HYDROCHLOROTHIAZIDE 25 MG PO TABS: 25.0000 mg | ORAL_TABLET | Freq: Every day | ORAL | Status: DC

## 2014-05-18 MED ORDER — LORAZEPAM 2 MG/ML IJ SOLN
1.0000 mg | Freq: Once | INTRAMUSCULAR | Status: AC
  Administered 2014-05-18: 1 mg via INTRAVENOUS
  Filled 2014-05-18: qty 1

## 2014-05-18 NOTE — Discharge Instructions (Signed)
The MRI of your brain today did not show any evidence of stroke.  It is recommended that you follow up with your Primary Care Physician.   Start taking a baby aspirin (81 mg) daily.  Return to the Emergency Department if symptoms return.  Start taking blood pressure medication as directed. Hypertension As your heart beats, it forces blood through your arteries. This force is your blood pressure. If the pressure is too high, it is called hypertension (HTN) or high blood pressure. HTN is dangerous because you may have it and not know it. High blood pressure may mean that your heart has to work harder to pump blood. Your arteries may be narrow or stiff. The extra work puts you at risk for heart disease, stroke, and other problems.  Blood pressure consists of two numbers, a higher number over a lower, 110/72, for example. It is stated as "110 over 72." The ideal is below 120 for the top number (systolic) and under 80 for the bottom (diastolic). Write down your blood pressure today. You should pay close attention to your blood pressure if you have certain conditions such as:  Heart failure.  Prior heart attack.  Diabetes  Chronic kidney disease.  Prior stroke.  Multiple risk factors for heart disease. To see if you have HTN, your blood pressure should be measured while you are seated with your arm held at the level of the heart. It should be measured at least twice. A one-time elevated blood pressure reading (especially in the Emergency Department) does not mean that you need treatment. There may be conditions in which the blood pressure is different between your right and left arms. It is important to see your caregiver soon for a recheck. Most people have essential hypertension which means that there is not a specific cause. This type of high blood pressure may be lowered by changing lifestyle factors such as:  Stress.  Smoking.  Lack of exercise.  Excessive weight.  Drug/tobacco/alcohol  use.  Eating less salt. Most people do not have symptoms from high blood pressure until it has caused damage to the body. Effective treatment can often prevent, delay or reduce that damage. TREATMENT  When a cause has been identified, treatment for high blood pressure is directed at the cause. There are a large number of medications to treat HTN. These fall into several categories, and your caregiver will help you select the medicines that are best for you. Medications may have side effects. You should review side effects with your caregiver. If your blood pressure stays high after you have made lifestyle changes or started on medicines,   Your medication(s) may need to be changed.  Other problems may need to be addressed.  Be certain you understand your prescriptions, and know how and when to take your medicine.  Be sure to follow up with your caregiver within the time frame advised (usually within two weeks) to have your blood pressure rechecked and to review your medications.  If you are taking more than one medicine to lower your blood pressure, make sure you know how and at what times they should be taken. Taking two medicines at the same time can result in blood pressure that is too low. SEEK IMMEDIATE MEDICAL CARE IF:  You develop a severe headache, blurred or changing vision, or confusion.  You have unusual weakness or numbness, or a faint feeling.  You have severe chest or abdominal pain, vomiting, or breathing problems. MAKE SURE YOU:   Understand  these instructions.  Will watch your condition.  Will get help right away if you are not doing well or get worse. Document Released: 12/12/2005 Document Revised: 03/05/2012 Document Reviewed: 08/01/2008 [facility]Exit[facility] Patient Information 2014 [place], [place].

## 2014-05-18 NOTE — ED Provider Notes (Signed)
CSN: [id number]     Arrival date & time 05/18/14  1158 History   First MD Initiated Contact with Patient 05/18/14 1242     Chief Complaint  Patient presents with  . Hypertension  . Aphasia     (Consider location/radiation/quality/duration/timing/severity/associated sxs/prior Treatment) HPI Comments: Patient presents today with a chief complaint of slurred speech, disorientation, confusion, and blurred vision.  She reports that she began having the symptoms around 6 AM this morning while at work. She states that during this episode she could not remember the names of her coworkers.  She also states that she was having difficulty getting her words out.  She also had blurred vision of both eyes.  She reports that her symptoms lasted approximately fifteen minutes and then resolved.  She does not have any symptoms at this time.  She reports that she has never had anything like this before.  She denies any diplopia, dizziness, lightheadedness, headache, fever, chest pain, SOB, numbness, tingling, or weakness associated with this episode.  No prior history of CVA or TIA.  She reports that she has had HTN with Pregnancy in the past, but no history of HTN aside from that.  She is currently not pregnant and is not on any antihypertensive medications.    The history is provided by the patient.    Past Medical History  Diagnosis Date  . Hypertension   . Anxiety and depression   . Hx of preeclampsia, prior pregnancy, currently pregnant     prior pregnancy   Past Surgical History  Procedure Laterality Date  . No past surgeries    . Hemorrhoid surgery     Family History  Problem Relation Age of Onset  . Hypertension Other   . Cancer Other    History  Substance Use Topics  . Smoking status: Current Every Day Smoker -- 1.00 packs/day for 17 years    Types: Cigarettes    Last Attempt to Quit: 07/27/2013  . Smokeless tobacco: Never Used  . Alcohol Use: No   OB History   Grav Para Term Preterm  Abortions TAB SAB Ect Mult Living   7 6 6  0 1  1   6      Review of Systems  All other systems reviewed and are negative.     Allergies  Review of patient's allergies indicates no known allergies.  Home Medications   Prior to Admission medications   Medication Sig Start Date End Date Taking? Authorizing Provider  ALPRAZolam Prudy Feeler) 0.5 MG tablet Take 0.5 mg by mouth 3 (three) times daily as needed for anxiety.   Yes Historical Provider, MD  escitalopram (LEXAPRO) 10 MG tablet Take 10 mg by mouth daily.   Yes Historical Provider, MD  ferrous fumarate (HEMOCYTE - 106 MG FE) 325 (106 FE) MG TABS tablet Take 1 tablet by mouth every morning.   Yes Historical Provider, MD  omeprazole (PRILOSEC) 20 MG capsule Take 20 mg by mouth daily as needed (GERD).    Yes Historical Provider, MD  Prenatal Vit-Fe Fumarate-FA (PRENATAL MULTIVITAMIN) TABS tablet Take 1 tablet by mouth daily.   Yes Historical Provider, MD   BP 188/120  Pulse 78  Temp(Src) 98.6 F (37 C) (Oral)  Resp 17  SpO2 98% Physical Exam  Nursing note and vitals reviewed. Constitutional: She appears well-developed and well-nourished.  HENT:  Head: Normocephalic and atraumatic.  Mouth/Throat: Oropharynx is clear and moist.  Eyes: EOM are normal. Pupils are equal, round, and reactive to light.  Neck:  Normal range of motion. Neck supple.  Cardiovascular: Normal rate, regular rhythm and normal heart sounds.   Pulmonary/Chest: Effort normal and breath sounds normal.  Neurological: She is alert. She has normal strength. No cranial nerve deficit or sensory deficit. Coordination and gait normal.  Normal finger to nose testing Normal rapid alternating movements Normal gait, no ataxia  Skin: Skin is warm and dry.  Psychiatric: She has a normal mood and affect.    ED Course  Procedures (including critical care time) Labs Review Labs Reviewed  CBC - Abnormal; Notable for the following:    RBC 3.77 (*)    HCT 35.7 (*)    All  other components within normal limits  BASIC METABOLIC PANEL  POC URINE PREG, ED    Imaging Review Ct Head Wo Contrast  05/18/2014   CLINICAL DATA:  Slurred speech.  EXAM: CT HEAD WITHOUT CONTRAST  TECHNIQUE: Contiguous axial images were obtained from the base of the skull through the vertex without intravenous contrast.  COMPARISON:  None.  FINDINGS: Bony calvarium appears intact. No mass effect or midline shift is noted. Ventricular size is within normal limits. There is no evidence of mass lesion, hemorrhage or acute infarction.  IMPRESSION: Normal head CT.   Electronically Signed   By: Roque LiasJames  Green M.D.   On: 05/18/2014 13:50   Mr Brain Wo Contrast  05/18/2014   CLINICAL DATA:  Hypertension,  aphasia  EXAM: MRI HEAD WITHOUT CONTRAST  TECHNIQUE: Multiplanar, multiecho pulse sequences of the brain and surrounding structures were obtained without intravenous contrast.  COMPARISON:  CT head 05/18/2014  FINDINGS: Ventricle size is normal. Pituitary normal in size. Negative for Chiari malformation.  Negative for acute or chronic infarct. Negative for demyelinating disease. Cerebral white matter is normal. Brainstem and cerebellum are normal.  Negative for hemorrhage or mass. No shift of the midline structures.  Paranasal sinuses are clear.  IMPRESSION: Negative   Electronically Signed   By: Marlan Palauharles  Clark M.D.   On: 05/18/2014 15:56     EKG Interpretation None    Patient discussed with Dr. Patria Maneampos.  MDM   Final diagnoses:  None   Patient presenting with symptoms of slurred speech, disorientation, confusion, and blurred vision that lasted 15 minutes earlier this morning and then completely resolved.  Patient completely asymptomatic while in the ED.  Normal neurological exam.  Symptoms consistent with possible TIA.  Labs today unremarkable.  CT head and MRI brain are negative.  Feel that the patient is stable for discharge.  Patient instructed to start taking 81 mg Aspirin and follow up with  Primary Care Physician.  Return precautions given.      Santiago GladHeather Marleigh Kaylor, PA-C 05/19/14 [phone]

## 2014-05-18 NOTE — ED Notes (Signed)
Initial Contact - pt to RM3 with family, changed to hospital gown, placed to cardiac/02 monitor.  Pt reports trouble speaking, onset this AM while at work.  Pt reports coworkers noticed pt was having trouble.  +HTN.  Pt reports only hx while pregnant, "i might be pregnant again".  Pt denies other complaints.  Neuros grossly intact, very mild L pronator drift noted.  Skin PWD.  A+Ox4.  NAD.

## 2014-05-18 NOTE — ED Notes (Signed)
Pt to MRI

## 2014-05-18 NOTE — ED Notes (Addendum)
Pt states at work she was having slurred speech about 2 hours ago. Pt BP is elevated, only had it when pregnant. Pt is very tearful and anxious in triage. Symmetrical facial expressions noted, hand grips strong and equal bilaterally, no pronator drift noted. Pt c/o blurred vision but no dizziness.

## 2014-05-24 NOTE — ED Provider Notes (Signed)
Medical screening examination/treatment/procedure(s) were performed by non-physician practitioner and as supervising physician I was immediately available for consultation/collaboration.   EKG Interpretation   Date/Time:  Sunday May 18 2014 12:25:37 EDT Ventricular Rate:  75 PR Interval:  145 QRS Duration: 87 QT Interval:  385 QTC Calculation: 430 R Axis:   42 Text Interpretation:  Sinus rhythm ST elev, probable normal early repol  pattern No significant change since last tracing Confirmed by [facility]  MD,  MARTHA [phone]) on 05/18/2014 2:32:22 PM        Lyanne Co, MD 05/24/14 [phone]

## 2014-10-27 ENCOUNTER — Encounter (HOSPITAL_COMMUNITY): Payer: Self-pay | Admitting: Emergency Medicine

## 2015-05-10 ENCOUNTER — Emergency Department (HOSPITAL_COMMUNITY)
Admission: EM | Admit: 2015-05-10 | Discharge: 2015-05-11 | Disposition: A | Discharge status: Home / Self Care | Payer: Medicaid Other | Attending: Emergency Medicine | Admitting: Emergency Medicine

## 2015-05-10 ENCOUNTER — Encounter (HOSPITAL_COMMUNITY): Payer: Self-pay | Admitting: *Deleted

## 2015-05-10 DIAGNOSIS — F419 Anxiety disorder, unspecified: Secondary | ICD-10-CM | POA: Insufficient documentation

## 2015-05-10 DIAGNOSIS — F1110 Opioid abuse, uncomplicated: Secondary | ICD-10-CM | POA: Diagnosis not present

## 2015-05-10 DIAGNOSIS — Z3202 Encounter for pregnancy test, result negative: Secondary | ICD-10-CM | POA: Diagnosis not present

## 2015-05-10 DIAGNOSIS — Z720 Tobacco use: Secondary | ICD-10-CM | POA: Insufficient documentation

## 2015-05-10 DIAGNOSIS — Z79899 Other long term (current) drug therapy: Secondary | ICD-10-CM | POA: Insufficient documentation

## 2015-05-10 DIAGNOSIS — I10 Essential (primary) hypertension: Secondary | ICD-10-CM | POA: Diagnosis not present

## 2015-05-10 DIAGNOSIS — Z0000 Encounter for general adult medical examination without abnormal findings: Secondary | ICD-10-CM

## 2015-05-10 DIAGNOSIS — F329 Major depressive disorder, single episode, unspecified: Secondary | ICD-10-CM | POA: Diagnosis not present

## 2015-05-10 DIAGNOSIS — R45851 Suicidal ideations: Secondary | ICD-10-CM | POA: Diagnosis present

## 2015-05-10 LAB — CBC
HEMATOCRIT: 39.2 % (ref 36.0–46.0)
HEMOGLOBIN: 13.1 g/dL (ref 12.0–15.0)
MCH: 32.1 pg (ref 26.0–34.0)
MCHC: 33.4 g/dL (ref 30.0–36.0)
MCV: 96.1 fL (ref 78.0–100.0)
PLATELETS: 302 10*3/uL (ref 150–400)
RBC: 4.08 MIL/uL (ref 3.87–5.11)
RDW: 12.5 % (ref 11.5–15.5)
WBC: 9.2 10*3/uL (ref 4.0–10.5)

## 2015-05-10 LAB — COMPREHENSIVE METABOLIC PANEL
ALBUMIN: 4.6 g/dL (ref 3.5–5.0)
ALT: 10 U/L — AB (ref 14–54)
ANION GAP: 12 (ref 5–15)
AST: 19 U/L (ref 15–41)
Alkaline Phosphatase: 39 U/L (ref 38–126)
BILIRUBIN TOTAL: 0.4 mg/dL (ref 0.3–1.2)
BUN: 10 mg/dL (ref 6–20)
CALCIUM: 9.4 mg/dL (ref 8.9–10.3)
CO2: 23 mmol/L (ref 22–32)
CREATININE: 0.61 mg/dL (ref 0.44–1.00)
Chloride: 102 mmol/L (ref 101–111)
GFR calc Af Amer: >60 mL/min (ref >60)
GLUCOSE: 100 mg/dL — AB (ref 65–99)
Potassium: 3.5 mmol/L (ref 3.5–5.1)
Sodium: 137 mmol/L (ref 135–145)
TOTAL PROTEIN: 7.9 g/dL (ref 6.5–8.1)

## 2015-05-10 LAB — RAPID URINE DRUG SCREEN, HOSP PERFORMED
AMPHETAMINES: NOT DETECTED
BENZODIAZEPINES: NOT DETECTED
Barbiturates: NOT DETECTED
Cocaine: NOT DETECTED
Opiates: NOT DETECTED
TETRAHYDROCANNABINOL: NOT DETECTED

## 2015-05-10 LAB — POC URINE PREG, ED: Preg Test, Ur: NEGATIVE

## 2015-05-10 LAB — ETHANOL: Alcohol, Ethyl (B): <5 mg/dL (ref <5)

## 2015-05-10 LAB — SALICYLATE LEVEL: Salicylate Lvl: <4 mg/dL (ref 2.8–30.0)

## 2015-05-10 LAB — ACETAMINOPHEN LEVEL: Acetaminophen (Tylenol), Serum: <10 ug/mL — ABNORMAL LOW (ref 10–30)

## 2015-05-10 MED ORDER — LORAZEPAM 1 MG PO TABS
1.0000 mg | ORAL_TABLET | Freq: Three times a day (TID) | ORAL | Status: DC | PRN
  Administered 2015-05-10: 1 mg via ORAL
  Filled 2015-05-10: qty 1

## 2015-05-10 MED ORDER — ACETAMINOPHEN 325 MG PO TABS: 650.0000 mg | ORAL_TABLET | ORAL | Status: DC | PRN

## 2015-05-10 MED ORDER — ONDANSETRON HCL 4 MG PO TABS: 4.0000 mg | ORAL_TABLET | Freq: Three times a day (TID) | ORAL | Status: DC | PRN

## 2015-05-10 MED ORDER — IBUPROFEN 200 MG PO TABS
600.0000 mg | ORAL_TABLET | Freq: Three times a day (TID) | ORAL | Status: DC | PRN
  Administered 2015-05-10: 600 mg via ORAL
  Filled 2015-05-10: qty 3
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
## 2015-05-10 NOTE — ED Notes (Signed)
Pt. To [facility] from ED ambulatory without difficulty, to room 35  . Report from Tiffany RN. Pt. Is alert and oriented, warm and dry in no distress. Pt. Denies SI, HI, and AVH. Pt. Calm and cooperative. Pt. Made aware of security cameras and Q15 minute rounds. Pt. Encouraged to let Nursing staff know of any concerns or needs.  
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
## 2015-05-11 NOTE — Discharge Instructions (Signed)
Follow up with your primary care provider.

## 2015-05-11 NOTE — ED Notes (Signed)
Pt. Noted sleeping in room. No complaints or concerns voiced. No distress or abnormal behavior noted. Will continue to monitor with security cameras. Q 15 minute rounds continue. 

## 2015-05-11 NOTE — BH Assessment (Signed)
Assessment completed. Consulted Hulan FessIjeoma Nwaeze, NP who agrees that pt does not meet inpatient criteria. Pt denies SI, HI and AVH at this time. Emilia BeckKaitlyn Szekalski, PA-C has been informed of treatment recommendation. Dr. Preston FleetingGlick will rescind IVC/

## 2015-05-11 NOTE — ED Provider Notes (Signed)
Patient initially brought in under involuntary commitment for suicidal ideation. Evaluation by TTS shows no criteria for involuntary commitment. I've talked with the patient and she specifically denies suicidal or homicidal ideation. She also denies depression, hallucinations, constitutional symptoms suggestive of depression. Involuntary commitment is reversed and she is discharged to follow-up with her PCP.  Dione Boozeavid Telisa Ohlsen, MD 05/11/15 [phone]
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
## 2016-01-12 DIAGNOSIS — E782 Mixed hyperlipidemia: Secondary | ICD-10-CM | POA: Insufficient documentation

## 2016-01-12 DIAGNOSIS — A630 Anogenital (venereal) warts: Secondary | ICD-10-CM | POA: Insufficient documentation

## 2016-01-12 DIAGNOSIS — F319 Bipolar disorder, unspecified: Secondary | ICD-10-CM | POA: Insufficient documentation

## 2016-01-12 DIAGNOSIS — F321 Major depressive disorder, single episode, moderate: Secondary | ICD-10-CM | POA: Insufficient documentation

## 2016-01-12 DIAGNOSIS — G43909 Migraine, unspecified, not intractable, without status migrainosus: Secondary | ICD-10-CM | POA: Insufficient documentation

## 2016-08-08 ENCOUNTER — Ambulatory Visit: Payer: Self-pay | Admitting: Obstetrics & Gynecology

## 2017-12-26 NOTE — L&D Delivery Note (Signed)
Patient arrived for IOL 2/2 CHTN requiring multiple medications. She received one dose of cytotec, and then pitocin was started. She required minimal pitocin. At 2112 AROM was performed and IUPC placed. Patient progressed rapidly from there to NSVD.   Delivery Note At 9:52 PM a viable female was delivered via Vaginal, Spontaneous (Presentation:OP ).  APGAR: 8, 9; weight  pending.   Placenta status: spontaneous , complete/intact .  Cord: 3 vessels, short cord  with the following complications: none  .  Cord pH: NA  Anesthesia:  Epidural  Episiotomy: None Lacerations: 1st degree;Perineal Suture Repair: NA Est. Blood Loss (mL): 98  Mom to postpartum.  Baby to Couplet care / Skin to Skin.  Thressa ShellerHeather Ellenora Talton 11/10/2018, 10:26 PM

## 2018-01-08 DIAGNOSIS — D509 Iron deficiency anemia, unspecified: Secondary | ICD-10-CM | POA: Insufficient documentation

## 2018-05-26 DIAGNOSIS — H43393 Other vitreous opacities, bilateral: Secondary | ICD-10-CM | POA: Diagnosis not present

## 2018-05-26 DIAGNOSIS — H40033 Anatomical narrow angle, bilateral: Secondary | ICD-10-CM | POA: Diagnosis not present

## 2018-06-05 ENCOUNTER — Encounter: Payer: Self-pay | Admitting: Obstetrics

## 2018-06-05 ENCOUNTER — Encounter: Payer: Self-pay | Admitting: *Deleted

## 2018-06-05 ENCOUNTER — Ambulatory Visit (INDEPENDENT_AMBULATORY_CARE_PROVIDER_SITE_OTHER): Payer: BLUE CROSS/BLUE SHIELD | Admitting: Obstetrics

## 2018-06-05 VITALS — BP 133/90 | HR 96 | Ht 66.5 in | Wt 205.2 lb

## 2018-06-05 DIAGNOSIS — Z3201 Encounter for pregnancy test, result positive: Secondary | ICD-10-CM | POA: Diagnosis not present

## 2018-06-05 DIAGNOSIS — N912 Amenorrhea, unspecified: Secondary | ICD-10-CM | POA: Diagnosis not present

## 2018-06-05 LAB — POCT URINE PREGNANCY: Preg Test, Ur: POSITIVE — AB

## 2018-06-05 NOTE — Progress Notes (Signed)
Pt presents for pregnancy test, unsure LMP, and she wants to know if Lisinopril is safe to take during pregnancy. Pt c/o heartburn and acid reflux. Last pap 10/14/16, Last annual 01/08/18 - no pap d/t PCP's 5 year pap smear rule (PCP notes available in Epic).

## 2018-06-06 ENCOUNTER — Encounter: Payer: Self-pay | Admitting: Obstetrics

## 2018-06-06 ENCOUNTER — Ambulatory Visit (INDEPENDENT_AMBULATORY_CARE_PROVIDER_SITE_OTHER): Payer: BLUE CROSS/BLUE SHIELD | Admitting: Obstetrics

## 2018-06-06 VITALS — BP 121/81 | HR 87 | Wt 204.4 lb

## 2018-06-06 DIAGNOSIS — O26899 Other specified pregnancy related conditions, unspecified trimester: Secondary | ICD-10-CM

## 2018-06-06 DIAGNOSIS — N898 Other specified noninflammatory disorders of vagina: Secondary | ICD-10-CM

## 2018-06-06 DIAGNOSIS — Z113 Encounter for screening for infections with a predominantly sexual mode of transmission: Secondary | ICD-10-CM

## 2018-06-06 DIAGNOSIS — N760 Acute vaginitis: Secondary | ICD-10-CM | POA: Diagnosis not present

## 2018-06-06 DIAGNOSIS — O99210 Obesity complicating pregnancy, unspecified trimester: Secondary | ICD-10-CM

## 2018-06-06 DIAGNOSIS — O10919 Unspecified pre-existing hypertension complicating pregnancy, unspecified trimester: Secondary | ICD-10-CM

## 2018-06-06 DIAGNOSIS — O09512 Supervision of elderly primigravida, second trimester: Secondary | ICD-10-CM | POA: Diagnosis not present

## 2018-06-06 DIAGNOSIS — O10911 Unspecified pre-existing hypertension complicating pregnancy, first trimester: Secondary | ICD-10-CM

## 2018-06-06 DIAGNOSIS — O0990 Supervision of high risk pregnancy, unspecified, unspecified trimester: Secondary | ICD-10-CM | POA: Insufficient documentation

## 2018-06-06 DIAGNOSIS — O09529 Supervision of elderly multigravida, unspecified trimester: Secondary | ICD-10-CM | POA: Diagnosis not present

## 2018-06-06 DIAGNOSIS — O09521 Supervision of elderly multigravida, first trimester: Secondary | ICD-10-CM

## 2018-06-06 DIAGNOSIS — Z1151 Encounter for screening for human papillomavirus (HPV): Secondary | ICD-10-CM | POA: Diagnosis not present

## 2018-06-06 DIAGNOSIS — Z124 Encounter for screening for malignant neoplasm of cervix: Secondary | ICD-10-CM

## 2018-06-06 DIAGNOSIS — O26891 Other specified pregnancy related conditions, first trimester: Secondary | ICD-10-CM

## 2018-06-06 DIAGNOSIS — R12 Heartburn: Secondary | ICD-10-CM

## 2018-06-06 DIAGNOSIS — O99211 Obesity complicating pregnancy, first trimester: Secondary | ICD-10-CM

## 2018-06-06 MED ORDER — AMLODIPINE BESYLATE 5 MG PO TABS: 5.0000 mg | ORAL_TABLET | Freq: Every day | ORAL | 11 refills | Status: DC

## 2018-06-06 MED ORDER — RANITIDINE HCL 150 MG PO TABS: 150.0000 mg | ORAL_TABLET | Freq: Two times a day (BID) | ORAL | 5 refills | Status: DC

## 2018-06-06 MED ORDER — VITAFOL ULTRA 29-0.6-0.4-200 MG PO CAPS: 1.0000 | ORAL_CAPSULE | Freq: Every day | ORAL | 4 refills | Status: DC

## 2018-06-06 NOTE — Progress Notes (Signed)
Pt presents for NOB work-up today.   Last pap :10/14/2016  Pt wants Genetic Testing.  Pt states she did not get Rx's that were discussed.

## 2018-06-06 NOTE — Progress Notes (Signed)
Subjective:  Paula Dominguez is a 37 y.o. [id number] at 5570w3d being seen today for ongoing prenatal care.  She is currently monitored for the following issues for this high-risk pregnancy and has Headache in pregnancy, antepartum; GERD without esophagitis; Depression complicating pregnancy in second trimester, antepartum; Abscess of pulp of tooth; Nausea and vomiting in pregnancy; Pre-eclampsia; Pregnancy; Normal delivery; Post partum depression; Hemorrhoids in the puerperium; Panic disorder with agoraphobia and moderate panic attacks; and Supervision of normal pregnancy, antepartum on their problem list.  Patient reports heartburn.  Contractions: Not present. Vag. Bleeding: None.  Movement: Present. Denies leaking of fluid.   The following portions of the patient's history were reviewed and updated as appropriate: allergies, current medications, past family history, past medical history, past social history, past surgical history and problem list. Problem list updated.  Objective:   Vitals:   06/06/18 1058  BP: 121/81  Pulse: 87  Weight: 204 lb 6.4 oz (92.7 kg)    Fetal Status: Fetal Heart Rate (bpm): 150   Movement: Present     General:  Alert, oriented and cooperative. Patient is in no acute distress.  Skin: Skin is warm and dry. No rash noted.   Cardiovascular: Normal heart rate noted  Respiratory: Normal respiratory effort, no problems with respiration noted  Abdomen: Soft, gravid, appropriate for gestational age. Pain/Pressure: Absent     Pelvic:  Cervical exam deferred        Extremities: Normal range of motion.  Edema: None  Mental Status: Normal mood and affect. Normal behavior. Normal judgment and thought content.   Urinalysis:      Assessment and Plan:  Pregnancy: [id number] at 4870w3d  1. Supervision of elderly multigravida, antepartum Rx: - Obstetric Panel, Including HIV - Culture, OB Urine - Cytology - PAP - Genetic Screening - Cystic Fibrosis Mutation 97 - SMN1 COPY  NUMBER ANALYSIS (SMA Carrier Screen) - Hemoglobinopathy evaluation - AMB MFM GENETICS REFERRAL - US MFM OB DETAIL +14 WK; Future - US MFM OB Transvaginal; Future - amLODipine (NORVASC) 5 MG tablet; Take 1 tablet (5 mg total) by mouth daily.  Dispense: 30 tablet; Refill: 11 - Prenat-Fe Poly-Methfol-FA-DHA (VITAFOL ULTRA) 29-0.6-0.4-200 MG CAPS; Take 1 capsule by mouth daily before breakfast.  Dispense: 90 capsule; Refill: 4  2. Chronic hypertension during pregnancy, antepartum Rx: - US MFM OB DETAIL +14 WK; Future - US MFM OB Transvaginal; Future - amLODipine (NORVASC) 5 MG tablet; Take 1 tablet (5 mg total) by mouth daily.  Dispense: 30 tablet; Refill: 11  3. Obesity affecting pregnancy, antepartum  4. Heartburn during pregnancy, antepartum Rx: - ranitidine (ZANTAC) 150 MG tablet; Take 1 tablet (150 mg total) by mouth 2 (two) times daily.  Dispense: 60 tablet; Refill: 5  5. Vaginal discharge Rx: - Cervicovaginal ancillary only   Preterm labor symptoms and general obstetric precautions including but not limited to vaginal bleeding, contractions, leaking of fluid and fetal movement were reviewed in detail with the patient. Please refer to After Visit Summary for other counseling recommendations.  Return in about 2 weeks (around 06/20/2018) for ROB.  AFP.Marland Kitchen.   Brock BadHarper, Charles A, MD

## 2018-06-07 LAB — CERVICOVAGINAL ANCILLARY ONLY
Bacterial vaginitis: POSITIVE — AB
CANDIDA VAGINITIS: NEGATIVE
Chlamydia: NEGATIVE
NEISSERIA GONORRHEA: NEGATIVE
TRICH (WINDOWPATH): NEGATIVE

## 2018-06-08 ENCOUNTER — Other Ambulatory Visit: Payer: Self-pay | Admitting: Obstetrics

## 2018-06-08 DIAGNOSIS — N760 Acute vaginitis: Principal | ICD-10-CM

## 2018-06-08 DIAGNOSIS — B9689 Other specified bacterial agents as the cause of diseases classified elsewhere: Secondary | ICD-10-CM

## 2018-06-08 LAB — CYTOLOGY - PAP
Diagnosis: NEGATIVE
HPV: NOT DETECTED

## 2018-06-08 LAB — CULTURE, OB URINE

## 2018-06-08 LAB — URINE CULTURE, OB REFLEX

## 2018-06-08 MED ORDER — METRONIDAZOLE 500 MG PO TABS: 500.0000 mg | ORAL_TABLET | Freq: Two times a day (BID) | ORAL | 2 refills | Status: DC

## 2018-06-12 ENCOUNTER — Telehealth: Payer: Self-pay

## 2018-06-12 NOTE — Telephone Encounter (Signed)
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   Pt wants to consult with Dr.Harper.  Pt states she will waiti when he is back in the office.

## 2018-06-13 ENCOUNTER — Encounter: Payer: Self-pay | Admitting: Obstetrics

## 2018-06-13 LAB — SMN1 COPY NUMBER ANALYSIS (SMA CARRIER SCREENING)

## 2018-06-13 LAB — OBSTETRIC PANEL, INCLUDING HIV
Antibody Screen: NEGATIVE
Basophils Absolute: 0 10*3/uL (ref 0.0–0.2)
Basos: 0 %
EOS (ABSOLUTE): 0.1 10*3/uL (ref 0.0–0.4)
Eos: 1 %
HEP B S AG: NEGATIVE
HIV Screen 4th Generation wRfx: NONREACTIVE
Hematocrit: 33 % — ABNORMAL LOW (ref 34.0–46.6)
Hemoglobin: 10.9 g/dL — ABNORMAL LOW (ref 11.1–15.9)
IMMATURE GRANULOCYTES: 1 %
Immature Grans (Abs): 0.1 10*3/uL (ref 0.0–0.1)
LYMPHS ABS: 2.9 10*3/uL (ref 0.7–3.1)
LYMPHS: 28 %
MCH: 31.3 pg (ref 26.6–33.0)
MCHC: 33 g/dL (ref 31.5–35.7)
MCV: 95 fL (ref 79–97)
Monocytes Absolute: 0.5 10*3/uL (ref 0.1–0.9)
Monocytes: 5 %
Neutrophils Absolute: 6.7 10*3/uL (ref 1.4–7.0)
Neutrophils: 65 %
Platelets: 314 10*3/uL (ref 150–450)
RBC: 3.48 x10E6/uL — AB (ref 3.77–5.28)
RDW: 14 % (ref 12.3–15.4)
RPR: NONREACTIVE
Rh Factor: POSITIVE
Rubella Antibodies, IGG: 1.14 index (ref >0.99)
WBC: 10.3 10*3/uL (ref 3.4–10.8)

## 2018-06-13 LAB — HEMOGLOBINOPATHY EVALUATION
HEMOGLOBIN A2 QUANTITATION: 2.1 % (ref 1.8–3.2)
HGB C: 0 %
HGB S: 0 %
HGB VARIANT: 0 %
Hemoglobin F Quantitation: 0 % (ref 0.0–2.0)
Hgb A: 97.9 % (ref 96.4–98.8)

## 2018-06-13 LAB — CYSTIC FIBROSIS MUTATION 97: Interpretation: NOT DETECTED

## 2018-06-15 ENCOUNTER — Other Ambulatory Visit: Payer: Self-pay | Admitting: Obstetrics

## 2018-06-15 ENCOUNTER — Ambulatory Visit (HOSPITAL_COMMUNITY)
Admission: RE | Admit: 2018-06-15 | Discharge: 2018-06-15 | Disposition: A | Discharge status: Home / Self Care | Payer: BLUE CROSS/BLUE SHIELD | Source: Ambulatory Visit | Attending: Obstetrics | Admitting: Obstetrics

## 2018-06-15 ENCOUNTER — Encounter (HOSPITAL_COMMUNITY): Payer: Self-pay

## 2018-06-15 DIAGNOSIS — Z363 Encounter for antenatal screening for malformations: Secondary | ICD-10-CM | POA: Diagnosis not present

## 2018-06-15 DIAGNOSIS — O09522 Supervision of elderly multigravida, second trimester: Secondary | ICD-10-CM | POA: Diagnosis not present

## 2018-06-15 DIAGNOSIS — O10919 Unspecified pre-existing hypertension complicating pregnancy, unspecified trimester: Secondary | ICD-10-CM

## 2018-06-15 DIAGNOSIS — Z3687 Encounter for antenatal screening for uncertain dates: Secondary | ICD-10-CM | POA: Insufficient documentation

## 2018-06-15 DIAGNOSIS — Z3A15 15 weeks gestation of pregnancy: Secondary | ICD-10-CM | POA: Insufficient documentation

## 2018-06-15 DIAGNOSIS — O09529 Supervision of elderly multigravida, unspecified trimester: Secondary | ICD-10-CM

## 2018-06-15 DIAGNOSIS — O10012 Pre-existing essential hypertension complicating pregnancy, second trimester: Secondary | ICD-10-CM | POA: Diagnosis not present

## 2018-06-15 DIAGNOSIS — Z3492 Encounter for supervision of normal pregnancy, unspecified, second trimester: Secondary | ICD-10-CM

## 2018-06-22 ENCOUNTER — Other Ambulatory Visit: Payer: Self-pay

## 2018-06-22 ENCOUNTER — Ambulatory Visit (INDEPENDENT_AMBULATORY_CARE_PROVIDER_SITE_OTHER): Payer: BLUE CROSS/BLUE SHIELD | Admitting: Obstetrics

## 2018-06-22 ENCOUNTER — Encounter: Payer: Self-pay | Admitting: Obstetrics

## 2018-06-22 VITALS — BP 121/83 | HR 87 | Wt 208.7 lb

## 2018-06-22 DIAGNOSIS — Z3482 Encounter for supervision of other normal pregnancy, second trimester: Secondary | ICD-10-CM

## 2018-06-22 DIAGNOSIS — O09522 Supervision of elderly multigravida, second trimester: Secondary | ICD-10-CM

## 2018-06-22 DIAGNOSIS — O26892 Other specified pregnancy related conditions, second trimester: Secondary | ICD-10-CM

## 2018-06-22 DIAGNOSIS — G5600 Carpal tunnel syndrome, unspecified upper limb: Secondary | ICD-10-CM

## 2018-06-22 DIAGNOSIS — O10912 Unspecified pre-existing hypertension complicating pregnancy, second trimester: Secondary | ICD-10-CM

## 2018-06-22 DIAGNOSIS — R12 Heartburn: Secondary | ICD-10-CM

## 2018-06-22 DIAGNOSIS — O26899 Other specified pregnancy related conditions, unspecified trimester: Secondary | ICD-10-CM

## 2018-06-22 DIAGNOSIS — O10919 Unspecified pre-existing hypertension complicating pregnancy, unspecified trimester: Secondary | ICD-10-CM

## 2018-06-22 DIAGNOSIS — O99212 Obesity complicating pregnancy, second trimester: Secondary | ICD-10-CM

## 2018-06-22 DIAGNOSIS — O09529 Supervision of elderly multigravida, unspecified trimester: Secondary | ICD-10-CM

## 2018-06-22 DIAGNOSIS — O99210 Obesity complicating pregnancy, unspecified trimester: Secondary | ICD-10-CM

## 2018-06-22 DIAGNOSIS — M545 Low back pain, unspecified: Secondary | ICD-10-CM

## 2018-06-22 MED ORDER — COMFORT FIT MATERNITY SUPP SM MISC: 0 refills | Status: DC

## 2018-06-22 MED ORDER — OMEPRAZOLE 20 MG PO CPDR: 20.0000 mg | DELAYED_RELEASE_CAPSULE | Freq: Every day | ORAL | 5 refills | Status: DC | PRN

## 2018-06-22 NOTE — Progress Notes (Signed)
ROB. C/o heartburns, always cold and corporal tunnel; she also has pressure and pain and [facility]

## 2018-06-22 NOTE — Progress Notes (Signed)
Subjective:  Paula Dominguez is Dominguez 37 y.o. [id number] at 5813w6d being seen today for ongoing prenatal care.  She is currently monitored for the following issues for this high-risk pregnancy and has Headache in pregnancy, antepartum; GERD without esophagitis; Depression complicating pregnancy in second trimester, antepartum; Abscess of pulp of tooth; Nausea and vomiting in pregnancy; Pre-eclampsia; Pregnancy; Normal delivery; Post partum depression; Hemorrhoids in the puerperium; Panic disorder with agoraphobia and moderate panic attacks; and Supervision of normal pregnancy, antepartum on their problem list.  Patient reports backache, heartburn and carpal tunnel symptoms.  Contractions: Not present. Vag. Bleeding: None.   . Denies leaking of fluid.   The following portions of the patient's history were reviewed and updated as appropriate: allergies, current medications, past family history, past medical history, past social history, past surgical history and problem list. Problem list updated.  Objective:   Vitals:   06/22/18 0843  BP: 121/83  Pulse: 87  Weight: 208 lb 11.2 oz (94.7 kg)    Fetal Status:           General:  Alert, oriented and cooperative. Patient is in no acute distress.  Skin: Skin is warm and dry. No rash noted.   Cardiovascular: Normal heart rate noted  Respiratory: Normal respiratory effort, no problems with respiration noted  Abdomen: Soft, gravid, appropriate for gestational age. Pain/Pressure: Present     Pelvic:  Cervical exam deferred        Extremities: Normal range of motion.  Edema: Trace  Mental Status: Normal mood and affect. Normal behavior. Normal judgment and thought content.   Urinalysis:      Assessment and Plan:  Pregnancy: [id number] at 4713w6d  1. Supervision of elderly multigravida, antepartum Rx: - AFP, Serum, Open Spina Bifida - US MFM OB COMP + 14 WK; Future  2. Chronic hypertension during pregnancy, antepartum - stable  3. Obesity affecting  pregnancy, antepartum  4. Acute midline low back pain without sciatica Rx: - Elastic Bandages & Supports (COMFORT FIT MATERNITY SUPP SM) MISC; Wear as directed.  Dispense: 1 each; Refill: 0  5. Heartburn during pregnancy, antepartum - Prilosec Rx  6. Carpal tunnel syndrome, unspecified laterality - Wrist Splints Rx  Preterm labor symptoms and general obstetric precautions including but not limited to vaginal bleeding, contractions, leaking of fluid and fetal movement were reviewed in detail with the patient. Please refer to After Visit Summary for other counseling recommendations.  Return in about 1 month (around 07/20/2018) for ROB.   Brock BadHarper, Paula Wiens A, MD

## 2018-06-28 LAB — AFP, SERUM, OPEN SPINA BIFIDA
AFP MoM: 0.77
AFP Value: 24.3 ng/mL
GEST. AGE ON COLLECTION DATE: 16.6 wk
Maternal Age At EDD: 38.4 yr
OSBR RISK 1 IN: 10000
TEST RESULTS AFP: NEGATIVE
Weight: 208 [lb_av]

## 2018-07-03 ENCOUNTER — Telehealth: Payer: Self-pay

## 2018-07-03 ENCOUNTER — Other Ambulatory Visit: Payer: Self-pay

## 2018-07-03 NOTE — Telephone Encounter (Signed)
Ok per Dr.Harper for patient to change work shift and work 36 hours. Letter faxed to patient's job.  TC to speak with patient twice pt not ava left detailed messages.

## 2018-07-04 ENCOUNTER — Encounter (HOSPITAL_COMMUNITY): Payer: Self-pay

## 2018-07-09 ENCOUNTER — Encounter (HOSPITAL_COMMUNITY): Payer: Self-pay

## 2018-07-09 ENCOUNTER — Ambulatory Visit (HOSPITAL_COMMUNITY)
Admission: RE | Admit: 2018-07-09 | Discharge: 2018-07-09 | Disposition: A | Discharge status: Home / Self Care | Payer: BLUE CROSS/BLUE SHIELD | Source: Ambulatory Visit | Attending: Obstetrics | Admitting: Obstetrics

## 2018-07-09 ENCOUNTER — Ambulatory Visit (HOSPITAL_COMMUNITY): Admission: RE | Admit: 2018-07-09 | Payer: BLUE CROSS/BLUE SHIELD | Source: Ambulatory Visit

## 2018-07-09 ENCOUNTER — Other Ambulatory Visit (HOSPITAL_COMMUNITY): Payer: Self-pay | Admitting: *Deleted

## 2018-07-09 ENCOUNTER — Other Ambulatory Visit: Payer: Self-pay | Admitting: Obstetrics

## 2018-07-09 DIAGNOSIS — IMO0002 Reserved for concepts with insufficient information to code with codable children: Secondary | ICD-10-CM

## 2018-07-09 DIAGNOSIS — O10919 Unspecified pre-existing hypertension complicating pregnancy, unspecified trimester: Secondary | ICD-10-CM

## 2018-07-09 DIAGNOSIS — Z363 Encounter for antenatal screening for malformations: Secondary | ICD-10-CM | POA: Diagnosis not present

## 2018-07-09 DIAGNOSIS — O09529 Supervision of elderly multigravida, unspecified trimester: Secondary | ICD-10-CM

## 2018-07-09 DIAGNOSIS — Z3A19 19 weeks gestation of pregnancy: Secondary | ICD-10-CM

## 2018-07-09 DIAGNOSIS — Z3687 Encounter for antenatal screening for uncertain dates: Secondary | ICD-10-CM | POA: Insufficient documentation

## 2018-07-09 DIAGNOSIS — Z0489 Encounter for examination and observation for other specified reasons: Secondary | ICD-10-CM

## 2018-07-09 DIAGNOSIS — O10012 Pre-existing essential hypertension complicating pregnancy, second trimester: Secondary | ICD-10-CM

## 2018-07-09 DIAGNOSIS — Z362 Encounter for other antenatal screening follow-up: Secondary | ICD-10-CM

## 2018-07-09 DIAGNOSIS — O09522 Supervision of elderly multigravida, second trimester: Secondary | ICD-10-CM | POA: Diagnosis not present

## 2018-07-09 DIAGNOSIS — O10912 Unspecified pre-existing hypertension complicating pregnancy, second trimester: Secondary | ICD-10-CM | POA: Diagnosis not present

## 2018-07-11 ENCOUNTER — Encounter: Payer: Self-pay | Admitting: Obstetrics and Gynecology

## 2018-07-11 DIAGNOSIS — O10919 Unspecified pre-existing hypertension complicating pregnancy, unspecified trimester: Secondary | ICD-10-CM | POA: Insufficient documentation

## 2018-07-11 DIAGNOSIS — Z641 Problems related to multiparity: Secondary | ICD-10-CM | POA: Insufficient documentation

## 2018-07-20 ENCOUNTER — Ambulatory Visit (INDEPENDENT_AMBULATORY_CARE_PROVIDER_SITE_OTHER): Payer: BLUE CROSS/BLUE SHIELD | Admitting: Obstetrics

## 2018-07-20 ENCOUNTER — Encounter: Payer: Self-pay | Admitting: Obstetrics

## 2018-07-20 ENCOUNTER — Encounter: Payer: Self-pay | Admitting: *Deleted

## 2018-07-20 VITALS — BP 127/86 | HR 88 | Wt 207.6 lb

## 2018-07-20 DIAGNOSIS — R51 Headache: Secondary | ICD-10-CM

## 2018-07-20 DIAGNOSIS — Z3480 Encounter for supervision of other normal pregnancy, unspecified trimester: Secondary | ICD-10-CM | POA: Diagnosis not present

## 2018-07-20 DIAGNOSIS — O99712 Diseases of the skin and subcutaneous tissue complicating pregnancy, second trimester: Secondary | ICD-10-CM

## 2018-07-20 DIAGNOSIS — O10912 Unspecified pre-existing hypertension complicating pregnancy, second trimester: Secondary | ICD-10-CM

## 2018-07-20 DIAGNOSIS — O99212 Obesity complicating pregnancy, second trimester: Secondary | ICD-10-CM

## 2018-07-20 DIAGNOSIS — O99210 Obesity complicating pregnancy, unspecified trimester: Secondary | ICD-10-CM

## 2018-07-20 DIAGNOSIS — Z3482 Encounter for supervision of other normal pregnancy, second trimester: Secondary | ICD-10-CM

## 2018-07-20 DIAGNOSIS — O10919 Unspecified pre-existing hypertension complicating pregnancy, unspecified trimester: Secondary | ICD-10-CM

## 2018-07-20 DIAGNOSIS — R519 Headache, unspecified: Secondary | ICD-10-CM

## 2018-07-20 DIAGNOSIS — L299 Pruritus, unspecified: Secondary | ICD-10-CM

## 2018-07-20 MED ORDER — BUTALBITAL-APAP-CAFFEINE 50-325-40 MG PO TABS: 2.0000 | ORAL_TABLET | Freq: Three times a day (TID) | ORAL | 2 refills | Status: DC | PRN

## 2018-07-20 NOTE — Progress Notes (Signed)
Patient complains of bad headaches, had scant bleeding last week but none since, pt reports fetal movement. Pt also complains of itching.

## 2018-07-20 NOTE — Progress Notes (Signed)
Subjective:  Paula Dominguez is a 37 y.o. [id number] at 31w6dbeing seen today for ongoing prenatal care.  She is currently monitored for the following issues for this high-risk pregnancy and has Hx of preeclampsia, prior pregnancy, currently pregnant; Post partum depression; Panic disorder with agoraphobia and moderate panic attacks; Supervision of normal pregnancy, antepartum; Chronic hypertension during pregnancy, antepartum; and [place] on their problem list.  Patient reports headache and generalized pruritus.  Contractions: Not present. Vag. Bleeding: Scant.  Movement: Present. Denies leaking of fluid.   The following portions of the patient's history were reviewed and updated as appropriate: allergies, current medications, past family history, past medical history, past social history, past surgical history and problem list. Problem list updated.  Objective:   Vitals:   07/20/18 0947  BP: 127/86  Pulse: 88  Weight: 207 lb 9.6 oz (94.2 kg)    Fetal Status: Fetal Heart Rate (bpm): 150   Movement: Present     General:  Alert, oriented and cooperative. Patient is in no acute distress.  Skin: Skin is warm and dry. No rash noted.   Cardiovascular: Normal heart rate noted  Respiratory: Normal respiratory effort, no problems with respiration noted  Abdomen: Soft, gravid, appropriate for gestational age. Pain/Pressure: Present     Pelvic:  Cervical exam deferred        Extremities: Normal range of motion.  Edema: Trace  Mental Status: Normal mood and affect. Normal behavior. Normal judgment and thought content.   Urinalysis:      Assessment and Plan:  Pregnancy: [id number] 270w6d1. Supervision of other normal pregnancy, antepartum  2. Pruritus of pregnancy in second trimester Rx: - Bile acids, total - Comp Met (CMET)  3. Intractable headache, unspecified chronicity pattern, unspecified headache type Rx: - butalbital-acetaminophen-caffeine (FIORICET, ESGIC) 50-325-40 MG  tablet; Take 2 tablets by mouth every 8 (eight) hours as needed for headache.  Dispense: 30 tablet; Refill: 2  4. Chronic hypertension during pregnancy, antepartum  5. Obesity affecting pregnancy, antepartum  Preterm labor symptoms and general obstetric precautions including but not limited to vaginal bleeding, contractions, leaking of fluid and fetal movement were reviewed in detail with the patient. Please refer to After Visit Summary for other counseling recommendations.  No follow-ups on file.   HaShelly BombardMD

## 2018-07-21 LAB — COMPREHENSIVE METABOLIC PANEL
ALBUMIN: 3.7 g/dL (ref 3.5–5.5)
ALK PHOS: 48 IU/L (ref 39–117)
ALT: 6 IU/L (ref 0–32)
AST: 7 IU/L (ref 0–40)
Albumin/Globulin Ratio: 1.4 (ref 1.2–2.2)
BUN/Creatinine Ratio: 10 (ref 9–23)
BUN: 5 mg/dL — AB (ref 6–20)
Bilirubin Total: 0.2 mg/dL (ref 0.0–1.2)
CO2: 17 mmol/L — AB (ref 20–29)
CREATININE: 0.52 mg/dL — AB (ref 0.57–1.00)
Calcium: 9.3 mg/dL (ref 8.7–10.2)
Chloride: 104 mmol/L (ref 96–106)
GFR calc Af Amer: 140 mL/min/{1.73_m2} (ref >59)
GFR, EST NON AFRICAN AMERICAN: 122 mL/min/{1.73_m2} (ref >59)
GLOBULIN, TOTAL: 2.7 g/dL (ref 1.5–4.5)
Glucose: 73 mg/dL (ref 65–99)
Potassium: 4 mmol/L (ref 3.5–5.2)
SODIUM: 138 mmol/L (ref 134–144)
Total Protein: 6.4 g/dL (ref 6.0–8.5)

## 2018-07-24 LAB — BILE ACIDS, TOTAL: Bile Acids Total: <1 umol/L (ref 0.0–10.0)

## 2018-08-06 ENCOUNTER — Encounter (HOSPITAL_COMMUNITY): Payer: Self-pay

## 2018-08-06 ENCOUNTER — Ambulatory Visit (HOSPITAL_COMMUNITY)
Admission: RE | Admit: 2018-08-06 | Discharge: 2018-08-06 | Disposition: A | Discharge status: Home / Self Care | Payer: BLUE CROSS/BLUE SHIELD | Source: Ambulatory Visit | Attending: Obstetrics | Admitting: Obstetrics

## 2018-08-06 DIAGNOSIS — Z362 Encounter for other antenatal screening follow-up: Secondary | ICD-10-CM | POA: Diagnosis not present

## 2018-08-06 DIAGNOSIS — Z3A23 23 weeks gestation of pregnancy: Secondary | ICD-10-CM | POA: Insufficient documentation

## 2018-08-06 DIAGNOSIS — Z79899 Other long term (current) drug therapy: Secondary | ICD-10-CM | POA: Diagnosis not present

## 2018-08-06 DIAGNOSIS — Z3689 Encounter for other specified antenatal screening: Secondary | ICD-10-CM | POA: Insufficient documentation

## 2018-08-06 DIAGNOSIS — O10012 Pre-existing essential hypertension complicating pregnancy, second trimester: Secondary | ICD-10-CM | POA: Diagnosis not present

## 2018-08-06 DIAGNOSIS — O09522 Supervision of elderly multigravida, second trimester: Secondary | ICD-10-CM | POA: Diagnosis not present

## 2018-08-10 ENCOUNTER — Inpatient Hospital Stay (HOSPITAL_COMMUNITY)
Admission: AD | Admit: 2018-08-10 | Discharge: 2018-08-10 | Disposition: A | Discharge status: Home / Self Care | Payer: BLUE CROSS/BLUE SHIELD | Source: Ambulatory Visit | Attending: Obstetrics and Gynecology | Admitting: Obstetrics and Gynecology

## 2018-08-10 ENCOUNTER — Other Ambulatory Visit (HOSPITAL_COMMUNITY): Payer: Self-pay | Admitting: *Deleted

## 2018-08-10 ENCOUNTER — Other Ambulatory Visit: Payer: Self-pay

## 2018-08-10 DIAGNOSIS — J069 Acute upper respiratory infection, unspecified: Secondary | ICD-10-CM | POA: Diagnosis not present

## 2018-08-10 DIAGNOSIS — O99512 Diseases of the respiratory system complicating pregnancy, second trimester: Secondary | ICD-10-CM | POA: Insufficient documentation

## 2018-08-10 DIAGNOSIS — J0111 Acute recurrent frontal sinusitis: Secondary | ICD-10-CM | POA: Diagnosis not present

## 2018-08-10 DIAGNOSIS — F17210 Nicotine dependence, cigarettes, uncomplicated: Secondary | ICD-10-CM | POA: Diagnosis not present

## 2018-08-10 DIAGNOSIS — Z3A23 23 weeks gestation of pregnancy: Secondary | ICD-10-CM | POA: Diagnosis not present

## 2018-08-10 DIAGNOSIS — J00 Acute nasopharyngitis [common cold]: Secondary | ICD-10-CM | POA: Diagnosis present

## 2018-08-10 DIAGNOSIS — O99332 Smoking (tobacco) complicating pregnancy, second trimester: Secondary | ICD-10-CM | POA: Diagnosis not present

## 2018-08-10 DIAGNOSIS — O10919 Unspecified pre-existing hypertension complicating pregnancy, unspecified trimester: Secondary | ICD-10-CM

## 2018-08-10 NOTE — Progress Notes (Signed)
Pt called w/ complaints of migraines, pain, visual changes, aches, neck pain and a consistent cough, stuffy Pt states she has been miserable  Pt notes+FM unable to check temp for fever.  Ok per Dr.Harper to provide out of work note. Note will be faxed to pt job @ [phone]

## 2018-08-10 NOTE — MAU Note (Addendum)
Feel really bad.  Called dr, just to see if there is something she can take.  Had a cold for a wk, head is banging (frontal) , neck is banging, lots of drainage. No OB complaints. Feels like a sinus infection. Cough started today

## 2018-08-10 NOTE — Discharge Instructions (Signed)

## 2018-08-10 NOTE — MAU Provider Note (Signed)
History     CSN: [id number]  Arrival date and time: 08/10/18 1135   First Provider Initiated Contact with Patient 08/10/18 1429      Chief Complaint  Patient presents with  . Sinusitis  . nasal drainage   Paula Dominguez is a 37 y.o. [id number] at 140w6d who states she called [facility] to find out what she can take for a cold and headache and was advised to come to MAU.  She has a headache, pressure sensation over her forehead, postnasal drip and dry cough. Symptoms are similar to sinusitis she has had in the past. The headache is frontal and feels like a tight band.  She has not tried any home treatment. No associated nausea and vomithing or photophobia. Denies fever, chills, purulent nasal drainage or chest pain.     OB History  Gravida Para Term Preterm AB Living  8 6 6  0 1 6  SAB TAB Ectopic Multiple Live Births  1       6    # Outcome Date GA Lbr Len/2nd Weight Sex Delivery Anes PTL Lv  8 Current           7 Term 11/20/13 2838w6d 06:10 / 00:12 2920 g M Vag-Spont EPI, Local  LIV  6 Term 12/23/10 7422w0d  2722 g M Vag-Spont EPI  LIV     Birth Comments: preeclampsia  5 Term 03/29/06 4922w0d  3175 g F Vag-Spont   LIV  4 SAB 2006 7065w0d         3 Term 10/22/02 7422w0d  3175 g M Vag-Spont   LIV  2 Term 08/10/98 5461w0d  3175 g F Vag-Spont EPI  LIV  1 Term 03/23/95 2322w0d  3175 g M Vag-Spont EPI  LIV    Past Medical History:  Diagnosis Date  . Anxiety and depression   . Hx of preeclampsia, prior pregnancy, currently pregnant    prior pregnancy  . Hypertension     Past Surgical History:  Procedure Laterality Date  . HEMORRHOID SURGERY    . NO PAST SURGERIES      Family History  Problem Relation Age of Onset  . Hypertension Other   . Cancer Other     Social History   Tobacco Use  . Smoking status: Light Tobacco Smoker    Packs/day: 1.00    Years: 17.00    Pack years: 17.00    Types: Cigarettes    Last attempt to quit: 07/27/2013    Years since quitting: 5.0  . Smokeless  tobacco: Never Used  Substance Use Topics  . Alcohol use: No  . Drug use: No    Allergies: No Known Allergies  Medications Prior to Admission  Medication Sig Dispense Refill Last Dose  . ALPRAZolam (XANAX) 1 MG tablet Take 1 mg by mouth 3 (three) times daily as needed for anxiety.   Not Taking  . amLODipine (NORVASC) 5 MG tablet Take 1 tablet (5 mg total) by mouth daily. 30 tablet 11 Taking  . butalbital-acetaminophen-caffeine (FIORICET, ESGIC) 50-325-40 MG tablet Take 2 tablets by mouth every 8 (eight) hours as needed for headache. 30 tablet 2 Taking  . Elastic Bandages & Supports (COMFORT FIT MATERNITY SUPP SM) MISC Wear as directed. 1 each 0 Taking  . ferrous fumarate (HEMOCYTE - 106 MG FE) 325 (106 FE) MG TABS tablet Take 1 tablet by mouth every morning.   Taking  . hydrochlorothiazide (HYDRODIURIL) 25 MG tablet Take 1 tablet (25 mg total) by mouth daily. (Patient not  taking: Reported on 06/22/2018) 30 tablet 0 Not Taking  . metroNIDAZOLE (FLAGYL) 500 MG tablet Take 1 tablet (500 mg total) by mouth 2 (two) times daily. (Patient not taking: Reported on 06/22/2018) 14 tablet 2 Not Taking  . omeprazole (PRILOSEC) 20 MG capsule Take 1 capsule (20 mg total) by mouth daily as needed (GERD). 60 capsule 5 Taking  . Prenat-Fe Poly-Methfol-FA-DHA (VITAFOL ULTRA) 29-0.6-0.4-200 MG CAPS Take 1 capsule by mouth daily before breakfast. 90 capsule 4 Taking    Review of Systems  Constitutional: Negative for chills, fatigue and fever.  HENT: Positive for congestion, nosebleeds, postnasal drip, sinus pressure and sinus pain. Negative for sore throat.   Respiratory: Positive for cough. Negative for chest tightness, shortness of breath and wheezing.   Genitourinary: Negative for dysuria, flank pain, frequency, pelvic pain, urgency, vaginal bleeding and vaginal discharge.  Musculoskeletal: Negative for back pain.   Physical Exam   Blood pressure 125/72, pulse 87, temperature 98.1 F (36.7 C), temperature  source Oral, resp. rate 18, weight 98.3 kg, SpO2 97 %.  Physical Exam  Nursing note and vitals reviewed. Constitutional: She is oriented to person, place, and time. She appears well-developed and well-nourished. No distress.  HENT:  Head: Normocephalic.  Nose: Nose normal.  Mouth/Throat: Oropharynx is clear and moist.  Mildly TTP over frontal sinuses  Eyes: No scleral icterus.  Neck: Neck supple. No thyromegaly present.  Cardiovascular: Normal rate, regular rhythm and normal heart sounds.  Respiratory: Effort normal and breath sounds normal.  GI: Soft. There is no tenderness.  DT FHR 140  Musculoskeletal: Normal range of motion.  Neurological: She is alert and oriented to person, place, and time.  Skin: Skin is warm and dry.  Psychiatric: She has a normal mood and affect. Her behavior is normal.    MAU Course  Procedures  Reviewed OTC med list (advised no [facility] Reviewed general measures and UpTo Date patient information on sinusitis. Work note given.   Assessment and Plan   1. Viral URI   2. Acute recurrent frontal sinusitis    Allergies as of 08/10/2018   No Known Allergies     Medication List    STOP taking these medications   ALPRAZolam 1 MG tablet Commonly known as:  XANAX   hydrochlorothiazide 25 MG tablet Commonly known as:  HYDRODIURIL   metroNIDAZOLE 500 MG tablet Commonly known as:  FLAGYL     TAKE these medications   amLODipine 5 MG tablet Commonly known as:  NORVASC Take 1 tablet (5 mg total) by mouth daily.   butalbital-acetaminophen-caffeine 50-325-40 MG tablet Commonly known as:  FIORICET, ESGIC Take 2 tablets by mouth every 8 (eight) hours as needed for headache.   COMFORT FIT MATERNITY SUPP SM Misc Wear as directed.   ferrous fumarate 325 (106 Fe) MG Tabs tablet Commonly known as:  HEMOCYTE - 106 mg FE Take 1 tablet by mouth every morning.   omeprazole 20 MG capsule Commonly known as:  PRILOSEC Take 1 capsule (20 mg  total) by mouth daily as needed (GERD).   VITAFOL ULTRA 29-0.6-0.4-200 MG Caps Take 1 capsule by mouth daily before breakfast.      Follow-up Information    [facility] up on 08/17/2018.   Contact information: [address] [place] [id number] [phone]          Dnya Hickle CNM 08/10/2018, 2:29 PM

## 2018-08-17 ENCOUNTER — Encounter: Payer: BLUE CROSS/BLUE SHIELD | Admitting: Obstetrics

## 2018-08-20 ENCOUNTER — Ambulatory Visit (INDEPENDENT_AMBULATORY_CARE_PROVIDER_SITE_OTHER): Payer: BLUE CROSS/BLUE SHIELD | Admitting: Obstetrics

## 2018-08-20 ENCOUNTER — Encounter: Payer: Self-pay | Admitting: Obstetrics

## 2018-08-20 VITALS — BP 123/73 | HR 91 | Wt 211.9 lb

## 2018-08-20 DIAGNOSIS — J0100 Acute maxillary sinusitis, unspecified: Secondary | ICD-10-CM

## 2018-08-20 DIAGNOSIS — Z3482 Encounter for supervision of other normal pregnancy, second trimester: Secondary | ICD-10-CM

## 2018-08-20 DIAGNOSIS — O219 Vomiting of pregnancy, unspecified: Secondary | ICD-10-CM

## 2018-08-20 DIAGNOSIS — Z3480 Encounter for supervision of other normal pregnancy, unspecified trimester: Secondary | ICD-10-CM

## 2018-08-20 MED ORDER — PROMETHAZINE HCL 25 MG PO TABS: 25.0000 mg | ORAL_TABLET | Freq: Four times a day (QID) | ORAL | 1 refills | Status: DC | PRN

## 2018-08-20 MED ORDER — CEFUROXIME AXETIL 500 MG PO TABS: 500.0000 mg | ORAL_TABLET | Freq: Two times a day (BID) | ORAL | 0 refills | Status: DC

## 2018-08-20 MED ORDER — AZITHROMYCIN 500 MG PO TABS: 500.0000 mg | ORAL_TABLET | Freq: Every day | ORAL | 0 refills | Status: DC

## 2018-08-20 NOTE — Progress Notes (Signed)
Patient reports fetal movement with some uterine irritability. Pt complains of ongoing cough, headaches. Pt reports that she has barely eaten anything in the last 3 days due to nausea and stress. Pt reports that she is currently going through a separation.

## 2018-08-20 NOTE — Progress Notes (Signed)
Subjective:  Paula Dominguez is a 37 y.o. [id number] at 1756w2d being seen today for ongoing prenatal care.  She is currently monitored for the following issues for this low-risk pregnancy and has Hx of preeclampsia, prior pregnancy, currently pregnant; Post partum depression; Panic disorder with agoraphobia and moderate panic attacks; Supervision of normal pregnancy, antepartum; Chronic hypertension during pregnancy, antepartum; and Grand multiparity on their problem list.  Patient reports nausea, headache and sinus infection.  Contractions: Irritability. Vag. Bleeding: None.  Movement: Present. Denies leaking of fluid.   The following portions of the patient's history were reviewed and updated as appropriate: allergies, current medications, past family history, past medical history, past social history, past surgical history and problem list. Problem list updated.  Objective:   Vitals:   08/20/18 1520  BP: 123/73  Pulse: 91  Weight: 211 lb 14.4 oz (96.1 kg)    Fetal Status: Fetal Heart Rate (bpm): 150   Movement: Present     General:  Alert, oriented and cooperative. Patient is in no acute distress.  Skin: Skin is warm and dry. No rash noted.   Cardiovascular: Normal heart rate noted  Respiratory: Normal respiratory effort, no problems with respiration noted  Abdomen: Soft, gravid, appropriate for gestational age. Pain/Pressure: Present     Pelvic:  Cervical exam deferred        Extremities: Normal range of motion.  Edema: Trace  Mental Status: Normal mood and affect. Normal behavior. Normal judgment and thought content.   Urinalysis:      Assessment and Plan:  Pregnancy: [id number] at 1656w2d  1. Supervision of other normal pregnancy, antepartum  2. Nausea and vomiting during pregnancy Rx: - promethazine (PHENERGAN) 25 MG tablet; Take 1 tablet (25 mg total) by mouth every 6 (six) hours as needed for nausea or vomiting.  Dispense: 30 tablet; Refill: 1  3. Acute non-recurrent maxillary  sinusitis Rx: - cefUROXime (CEFTIN) 500 MG tablet; Take 1 tablet (500 mg total) by mouth 2 (two) times daily with a meal.  Dispense: 28 tablet; Refill: 0 - azithromycin (ZITHROMAX) 500 MG tablet; Take 1 tablet (500 mg total) by mouth daily. Two tablets po load, then one tablet po daily.  Dispense: 15 tablet; Refill: 0  Preterm labor symptoms and general obstetric precautions including but not limited to vaginal bleeding, contractions, leaking of fluid and fetal movement were reviewed in detail with the patient. Please refer to After Visit Summary for other counseling recommendations.  Return in about 3 weeks (around 09/10/2018) for ROB, 2 hour OGTT.   Brock BadHarper, Alonzo Owczarzak A, MD

## 2018-08-28 ENCOUNTER — Telehealth: Payer: Self-pay

## 2018-08-28 ENCOUNTER — Encounter: Payer: BLUE CROSS/BLUE SHIELD | Admitting: Obstetrics

## 2018-08-28 NOTE — Telephone Encounter (Signed)
Returned call, pt stated she needs note faxed to job stating that she has an appt on 09-10-18 to evaluate if she can return to work. Advised pt will send note.

## 2018-08-31 DIAGNOSIS — Z3481 Encounter for supervision of other normal pregnancy, first trimester: Secondary | ICD-10-CM

## 2018-09-04 ENCOUNTER — Ambulatory Visit (HOSPITAL_COMMUNITY)
Admit: 2018-09-04 | Discharge: 2018-09-04 | Disposition: A | Discharge status: Home / Self Care | Payer: BLUE CROSS/BLUE SHIELD | Attending: Obstetrics | Admitting: Obstetrics

## 2018-09-05 ENCOUNTER — Ambulatory Visit (HOSPITAL_COMMUNITY)
Admission: RE | Admit: 2018-09-05 | Discharge: 2018-09-05 | Disposition: A | Discharge status: Home / Self Care | Payer: BLUE CROSS/BLUE SHIELD | Source: Ambulatory Visit | Attending: Obstetrics | Admitting: Obstetrics

## 2018-09-05 ENCOUNTER — Encounter (HOSPITAL_COMMUNITY): Payer: Self-pay

## 2018-09-05 ENCOUNTER — Other Ambulatory Visit (HOSPITAL_COMMUNITY): Payer: Self-pay | Admitting: *Deleted

## 2018-09-05 DIAGNOSIS — Z362 Encounter for other antenatal screening follow-up: Secondary | ICD-10-CM | POA: Diagnosis not present

## 2018-09-05 DIAGNOSIS — O10019 Pre-existing essential hypertension complicating pregnancy, unspecified trimester: Secondary | ICD-10-CM | POA: Diagnosis present

## 2018-09-05 DIAGNOSIS — O09522 Supervision of elderly multigravida, second trimester: Secondary | ICD-10-CM | POA: Insufficient documentation

## 2018-09-05 DIAGNOSIS — Z3A27 27 weeks gestation of pregnancy: Secondary | ICD-10-CM

## 2018-09-05 DIAGNOSIS — O10012 Pre-existing essential hypertension complicating pregnancy, second trimester: Secondary | ICD-10-CM | POA: Diagnosis not present

## 2018-09-05 DIAGNOSIS — O10919 Unspecified pre-existing hypertension complicating pregnancy, unspecified trimester: Secondary | ICD-10-CM

## 2018-09-10 ENCOUNTER — Ambulatory Visit (INDEPENDENT_AMBULATORY_CARE_PROVIDER_SITE_OTHER): Payer: BLUE CROSS/BLUE SHIELD | Admitting: Obstetrics

## 2018-09-10 ENCOUNTER — Encounter: Payer: Self-pay | Admitting: Obstetrics

## 2018-09-10 ENCOUNTER — Other Ambulatory Visit: Payer: BLUE CROSS/BLUE SHIELD

## 2018-09-10 VITALS — BP 120/80 | HR 77 | Wt 217.2 lb

## 2018-09-10 DIAGNOSIS — Z23 Encounter for immunization: Secondary | ICD-10-CM | POA: Diagnosis not present

## 2018-09-10 DIAGNOSIS — O09529 Supervision of elderly multigravida, unspecified trimester: Secondary | ICD-10-CM

## 2018-09-10 DIAGNOSIS — O10913 Unspecified pre-existing hypertension complicating pregnancy, third trimester: Secondary | ICD-10-CM

## 2018-09-10 DIAGNOSIS — E669 Obesity, unspecified: Secondary | ICD-10-CM

## 2018-09-10 DIAGNOSIS — O99210 Obesity complicating pregnancy, unspecified trimester: Secondary | ICD-10-CM

## 2018-09-10 DIAGNOSIS — R519 Headache, unspecified: Secondary | ICD-10-CM

## 2018-09-10 DIAGNOSIS — R51 Headache: Secondary | ICD-10-CM

## 2018-09-10 DIAGNOSIS — O10919 Unspecified pre-existing hypertension complicating pregnancy, unspecified trimester: Secondary | ICD-10-CM

## 2018-09-10 DIAGNOSIS — O09523 Supervision of elderly multigravida, third trimester: Secondary | ICD-10-CM

## 2018-09-10 DIAGNOSIS — O99213 Obesity complicating pregnancy, third trimester: Secondary | ICD-10-CM

## 2018-09-10 MED ORDER — BUTALBITAL-APAP-CAFFEINE 50-325-40 MG PO TABS: 2.0000 | ORAL_TABLET | Freq: Three times a day (TID) | ORAL | 2 refills | Status: DC | PRN

## 2018-09-10 NOTE — Progress Notes (Signed)
Pt here for ROB. 

## 2018-09-10 NOTE — Progress Notes (Addendum)
Subjective:  Paula Dominguez is a 37 y.o. [id number] at 753w2d being seen today for ongoing prenatal care.  She is currently monitored for the following issues for this high-risk pregnancy and has Hx of preeclampsia, prior pregnancy, currently pregnant; Post partum depression; Panic disorder with agoraphobia and moderate panic attacks; Supervision of normal pregnancy, antepartum; Chronic hypertension during pregnancy, antepartum; and Grand multiparity on their problem list.  Patient reports swelling of feet, extreme fatigue and headache.  Contractions: Irregular. Vag. Bleeding: None.  Movement: Present. Denies leaking of fluid.   The following portions of the patient's history were reviewed and updated as appropriate: allergies, current medications, past family history, past medical history, past social history, past surgical history and problem list. Problem list updated.  Objective:   Vitals:   09/10/18 0920  BP: 120/80  Pulse: 77  Weight: 217 lb 3.2 oz (98.5 kg)    Fetal Status: Fetal Heart Rate (bpm): 150   Movement: Present     General:  Alert, oriented and cooperative. Patient is in no acute distress.  Skin: Skin is warm and dry. No rash noted.   Cardiovascular: Normal heart rate noted  Respiratory: Normal respiratory effort, no problems with respiration noted  Abdomen: Soft, gravid, appropriate for gestational age. Pain/Pressure: Present     Pelvic:  Cervical exam deferred        Extremities: Normal range of motion.  Edema: Trace  Mental Status: Normal mood and affect. Normal behavior. Normal judgment and thought content.   Urinalysis:      Assessment and Plan:  Pregnancy: [id number] at 363w2d  1. Supervision of elderly multigravida, antepartum Rx: - Tdap vaccine greater than or equal to 7yo IM - Flu Vaccine QUAD 36+ mos IM (Fluarix, Quad PF)  2. Chronic hypertension during pregnancy, antepartum - stable on Norvasc  3. Intractable headache, unspecified chronicity pattern,  unspecified headache type Rx: - butalbital-acetaminophen-caffeine (FIORICET, ESGIC) 50-325-40 MG tablet; Take 2 tablets by mouth every 8 (eight) hours as needed for headache.  Dispense: 30 tablet; Refill: 2  4. Obesity affecting pregnancy, antepartum  5. Fatigue, headaches and lower extremity edema - will start Maternity Leave   Preterm labor symptoms and general obstetric precautions including but not limited to vaginal bleeding, contractions, leaking of fluid and fetal movement were reviewed in detail with the patient. Please refer to After Visit Summary for other counseling recommendations.  Return in about 2 weeks (around 09/24/2018) for ROB.   Brock BadHarper, Asier Desroches A, MD

## 2018-09-17 ENCOUNTER — Other Ambulatory Visit: Payer: BLUE CROSS/BLUE SHIELD

## 2018-09-17 DIAGNOSIS — O09529 Supervision of elderly multigravida, unspecified trimester: Secondary | ICD-10-CM | POA: Diagnosis not present

## 2018-09-17 MED ORDER — ONDANSETRON HCL 8 MG PO TABS: 8.0000 mg | ORAL_TABLET | Freq: Three times a day (TID) | ORAL | 0 refills | Status: DC | PRN

## 2018-09-17 NOTE — Addendum Note (Signed)
Addended by: Tim LairLARK, Sunny Aguon on: 09/17/2018 11:29 AM   Modules accepted: Orders

## 2018-09-18 ENCOUNTER — Other Ambulatory Visit: Payer: Self-pay | Admitting: Obstetrics

## 2018-09-18 DIAGNOSIS — O99019 Anemia complicating pregnancy, unspecified trimester: Secondary | ICD-10-CM

## 2018-09-18 LAB — CBC
HEMATOCRIT: 29.8 % — AB (ref 34.0–46.6)
Hemoglobin: 10.2 g/dL — ABNORMAL LOW (ref 11.1–15.9)
MCH: 32.1 pg (ref 26.6–33.0)
MCHC: 34.2 g/dL (ref 31.5–35.7)
MCV: 94 fL (ref 79–97)
PLATELETS: 235 10*3/uL (ref 150–450)
RBC: 3.18 x10E6/uL — ABNORMAL LOW (ref 3.77–5.28)
RDW: 13.1 % (ref 12.3–15.4)
WBC: 10.2 10*3/uL (ref 3.4–10.8)

## 2018-09-18 LAB — HIV ANTIBODY (ROUTINE TESTING W REFLEX): HIV Screen 4th Generation wRfx: NONREACTIVE

## 2018-09-18 LAB — HEMOGLOBIN A1C
Est. average glucose Bld gHb Est-mCnc: 108 mg/dL
Hgb A1c MFr Bld: 5.4 % (ref 4.8–5.6)

## 2018-09-18 LAB — RPR: RPR Ser Ql: NONREACTIVE

## 2018-09-18 LAB — GLUCOSE, RANDOM: Glucose: 73 mg/dL (ref 65–99)

## 2018-09-18 MED ORDER — FERROUS FUMARATE 325 (106 FE) MG PO TABS: 1.0000 | ORAL_TABLET | Freq: Every morning | ORAL | 11 refills | Status: DC

## 2018-09-24 ENCOUNTER — Other Ambulatory Visit: Payer: Self-pay

## 2018-09-24 ENCOUNTER — Encounter: Payer: Self-pay | Admitting: Obstetrics

## 2018-09-24 ENCOUNTER — Other Ambulatory Visit: Payer: BLUE CROSS/BLUE SHIELD

## 2018-09-24 ENCOUNTER — Ambulatory Visit (INDEPENDENT_AMBULATORY_CARE_PROVIDER_SITE_OTHER): Payer: BLUE CROSS/BLUE SHIELD | Admitting: Obstetrics

## 2018-09-24 VITALS — BP 130/89 | HR 80 | Wt 219.3 lb

## 2018-09-24 DIAGNOSIS — Z3480 Encounter for supervision of other normal pregnancy, unspecified trimester: Secondary | ICD-10-CM | POA: Diagnosis not present

## 2018-09-24 DIAGNOSIS — O0990 Supervision of high risk pregnancy, unspecified, unspecified trimester: Secondary | ICD-10-CM

## 2018-09-24 DIAGNOSIS — O09529 Supervision of elderly multigravida, unspecified trimester: Secondary | ICD-10-CM

## 2018-09-24 DIAGNOSIS — O10919 Unspecified pre-existing hypertension complicating pregnancy, unspecified trimester: Secondary | ICD-10-CM

## 2018-09-24 NOTE — Progress Notes (Signed)
ROB/2Hr. GTT.  C/o swollen legs and ankles with pain 8/10 x 5 days.

## 2018-09-24 NOTE — Progress Notes (Signed)
Subjective:  Paula Dominguez is a 37 y.o. [id number] at [redacted]w[redacted]d being seen today for ongoing prenatal care.  She is currently monitored for the following issues for this high-risk pregnancy and has Hx of preeclampsia, prior pregnancy, currently pregnant; Post partum depression; Panic disorder with agoraphobia and moderate panic attacks; Supervision of normal pregnancy, antepartum; Chronic hypertension during pregnancy, antepartum; and Grand multiparity on their problem list.  Patient reports headache and swelling of feet.  Contractions: Irritability. Vag. Bleeding: None.  Movement: Present. Denies leaking of fluid.   The following portions of the patient's history were reviewed and updated as appropriate: allergies, current medications, past family history, past medical history, past social history, past surgical history and problem list. Problem list updated.  Objective:   Vitals:   09/24/18 1054 09/24/18 1155  BP: (!) 137/93 130/89  Pulse: 82 80  Weight: 219 lb 4.8 oz (99.5 kg)     Fetal Status: Fetal Heart Rate (bpm): 140   Movement: Present     General:  Alert, oriented and cooperative. Patient is in no acute distress.  Skin: Skin is warm and dry. No rash noted.   Cardiovascular: Normal heart rate noted  Respiratory: Normal respiratory effort, no problems with respiration noted  Abdomen: Soft, gravid, appropriate for gestational age. Pain/Pressure: Present     Pelvic:  Cervical exam deferred        Extremities: Normal range of motion.  Edema: Mild pitting, slight indentation  Mental Status: Normal mood and affect. Normal behavior. Normal judgment and thought content.   Urinalysis:      Assessment and Plan:  Pregnancy: [id number] at [redacted]w[redacted]d  1. Supervision of high risk pregnancy, antepartum Rx: - Glucose Tolerance, 2 Hours w/1 Hour  2. Supervision of elderly multigravida, antepartum  3. Chronic hypertension during pregnancy, antepartum Rx: - [place] MFM OB FOLLOW UP; Future - [place] MFM OB  FOLLOW UP; Future - [place] MFM FETAL BPP WO NON STRESS; Future - [place] MFM FETAL BPP WO NON STRESS; Future - [place] MFM FETAL BPP WO NON STRESS; Future - [place] MFM FETAL BPP WO NON STRESS; Future - [place] MFM FETAL BPP WO NON STRESS; Future - [place] MFM FETAL BPP WO NON STRESS; Future - [place] MFM FETAL BPP WO NON STRESS; Future - [place] MFM FETAL BPP WO NON STRESS; Future  Preterm labor symptoms and general obstetric precautions including but not limited to vaginal bleeding, contractions, leaking of fluid and fetal movement were reviewed in detail with the patient. Please refer to After Visit Summary for other counseling recommendations.  Return in about 17 days (around 10/11/2018) for ROB.   Brock Bad, MD

## 2018-09-25 ENCOUNTER — Encounter (HOSPITAL_COMMUNITY): Payer: Self-pay | Admitting: *Deleted

## 2018-09-25 ENCOUNTER — Inpatient Hospital Stay (HOSPITAL_COMMUNITY)
Admission: AD | Admit: 2018-09-25 | Discharge: 2018-09-25 | Disposition: A | Discharge status: Home / Self Care | Payer: BLUE CROSS/BLUE SHIELD | Source: Ambulatory Visit | Attending: Family Medicine | Admitting: Family Medicine

## 2018-09-25 ENCOUNTER — Other Ambulatory Visit: Payer: Self-pay

## 2018-09-25 ENCOUNTER — Inpatient Hospital Stay (HOSPITAL_BASED_OUTPATIENT_CLINIC_OR_DEPARTMENT_OTHER): Payer: BLUE CROSS/BLUE SHIELD

## 2018-09-25 DIAGNOSIS — O288 Other abnormal findings on antenatal screening of mother: Secondary | ICD-10-CM | POA: Diagnosis not present

## 2018-09-25 DIAGNOSIS — B3731 Acute candidiasis of vulva and vagina: Secondary | ICD-10-CM

## 2018-09-25 DIAGNOSIS — O368130 Decreased fetal movements, third trimester, not applicable or unspecified: Secondary | ICD-10-CM | POA: Insufficient documentation

## 2018-09-25 DIAGNOSIS — B373 Candidiasis of vulva and vagina: Secondary | ICD-10-CM | POA: Diagnosis not present

## 2018-09-25 DIAGNOSIS — Z87891 Personal history of nicotine dependence: Secondary | ICD-10-CM | POA: Insufficient documentation

## 2018-09-25 DIAGNOSIS — O09523 Supervision of elderly multigravida, third trimester: Secondary | ICD-10-CM

## 2018-09-25 DIAGNOSIS — O98813 Other maternal infectious and parasitic diseases complicating pregnancy, third trimester: Secondary | ICD-10-CM | POA: Insufficient documentation

## 2018-09-25 DIAGNOSIS — O4693 Antepartum hemorrhage, unspecified, third trimester: Secondary | ICD-10-CM | POA: Insufficient documentation

## 2018-09-25 DIAGNOSIS — Z3A30 30 weeks gestation of pregnancy: Secondary | ICD-10-CM | POA: Diagnosis not present

## 2018-09-25 LAB — WET PREP, GENITAL
Sperm: NONE SEEN
Trich, Wet Prep: NONE SEEN
Yeast Wet Prep HPF POC: NONE SEEN

## 2018-09-25 LAB — URINALYSIS, ROUTINE W REFLEX MICROSCOPIC
Bacteria, UA: NONE SEEN
Bilirubin Urine: NEGATIVE
Glucose, UA: NEGATIVE mg/dL
Ketones, ur: NEGATIVE mg/dL
Nitrite: NEGATIVE
Protein, ur: NEGATIVE mg/dL
Specific Gravity, Urine: 1.004 — ABNORMAL LOW (ref 1.005–1.030)
pH: 7 (ref 5.0–8.0)

## 2018-09-25 LAB — GLUCOSE TOLERANCE, 2 HOURS W/ 1HR
Glucose, 1 hour: 95 mg/dL (ref 65–179)
Glucose, 2 hour: 93 mg/dL (ref 65–152)
Glucose, Fasting: 66 mg/dL (ref 65–91)

## 2018-09-25 MED ORDER — TERCONAZOLE 0.8 % VA CREA: 1.0000 | TOPICAL_CREAM | Freq: Every day | VAGINAL | 0 refills | Status: DC

## 2018-09-25 NOTE — Discharge Instructions (Signed)
Fetal Movement Counts Patient Name: ________________________________________________ Patient Due Date: ____________________ What is a fetal movement count? A fetal movement count is the number of times that you feel your baby move during a certain amount of time. This may also be called a fetal kick count. A fetal movement count is recommended for every pregnant woman. You may be asked to start counting fetal movements as early as week 28 of your pregnancy. Pay attention to when your baby is most active. You may notice your baby's sleep and wake cycles. You may also notice things that make your baby move more. You should do a fetal movement count:  When your baby is normally most active.  At the same time each day.  A good time to count movements is while you are resting, after having something to eat and drink. How do I count fetal movements? 1. Find a quiet, comfortable area. Sit, or lie down on your side. 2. Write down the date, the start time and stop time, and the number of movements that you felt between those two times. Take this information with you to your health care visits. 3. For 2 hours, count kicks, flutters, swishes, rolls, and jabs. You should feel at least 10 movements during 2 hours. 4. You may stop counting after you have felt 10 movements. 5. If you do not feel 10 movements in 2 hours, have something to eat and drink. Then, keep resting and counting for 1 hour. If you feel at least 4 movements during that hour, you may stop counting. Contact a health care provider if:  You feel fewer than 4 movements in 2 hours.  Your baby is not moving like he or she usually does. Date: ____________ Start time: ____________ Stop time: ____________ Movements: ____________ Date: ____________ Start time: ____________ Stop time: ____________ Movements: ____________ Date: ____________ Start time: ____________ Stop time: ____________ Movements: ____________ Date: ____________ Start time:  ____________ Stop time: ____________ Movements: ____________ Date: ____________ Start time: ____________ Stop time: ____________ Movements: ____________ Date: ____________ Start time: ____________ Stop time: ____________ Movements: ____________ Date: ____________ Start time: ____________ Stop time: ____________ Movements: ____________ Date: ____________ Start time: ____________ Stop time: ____________ Movements: ____________ Date: ____________ Start time: ____________ Stop time: ____________ Movements: ____________ This information is not intended to replace advice given to you by your health care provider. Make sure you discuss any questions you have with your health care provider. Document Released: 01/11/2007 Document Revised: 08/10/2016 Document Reviewed: 01/21/2016 Elsevier Interactive Patient Education  2018 [employer]. Vaginal Yeast infection, Adult Vaginal yeast infection is a condition that causes soreness, swelling, and redness (inflammation) of the vagina. It also causes vaginal discharge. This is a common condition. Some women get this infection frequently. What are the causes? This condition is caused by a change in the normal balance of the yeast (candida) and bacteria that live in the vagina. This change causes an overgrowth of yeast, which causes the inflammation. What increases the risk? This condition is more likely to develop in:  Women who take antibiotic medicines.  Women who have diabetes.  Women who take birth control pills.  Women who are pregnant.  Women who douche often.  Women who have a weak defense (immune) system.  Women who have been taking steroid medicines for a long time.  Women who frequently wear tight clothing.  What are the signs or symptoms? Symptoms of this condition include:  White, thick vaginal discharge.  Swelling, itching, redness, and irritation of the vagina. The lips  of the vagina (vulva) may be affected as well.  Pain or a  burning feeling while urinating.  Pain during sex.  How is this diagnosed? This condition is diagnosed with a medical history and physical exam. This will include a pelvic exam. Your health care provider will examine a sample of your vaginal discharge under a microscope. Your health care provider may send this sample for testing to confirm the diagnosis. How is this treated? This condition is treated with medicine. Medicines may be over-the-counter or prescription. You may be told to use one or more of the following:  Medicine that is taken orally.  Medicine that is applied as a cream.  Medicine that is inserted directly into the vagina (suppository).  Follow these instructions at home:  Take or apply over-the-counter and prescription medicines only as told by your health care provider.  Do not have sex until your health care provider has approved. Tell your sex partner that you have a yeast infection. That person should go to his or her health care provider if he or she develops symptoms.  Do not wear tight clothes, such as pantyhose or tight pants.  Avoid using tampons until your health care provider approves.  Eat more yogurt. This may help to keep your yeast infection from returning.  Try taking a sitz bath to help with discomfort. This is a warm water bath that is taken while you are sitting down. The water should only come up to your hips and should cover your buttocks. Do this 3-4 times per day or as told by your health care provider.  Do not douche.  Wear breathable, cotton underwear.  If you have diabetes, keep your blood sugar levels under control. Contact a health care provider if:  You have a fever.  Your symptoms go away and then return.  Your symptoms do not get better with treatment.  Your symptoms get worse.  You have new symptoms.  You develop blisters in or around your vagina.  You have blood coming from your vagina and it is not your menstrual  period.  You develop pain in your abdomen. This information is not intended to replace advice given to you by your health care provider. Make sure you discuss any questions you have with your health care provider. Document Released: 09/21/2005 Document Revised: 05/25/2016 Document Reviewed: 06/15/2015 Elsevier Interactive Patient Education  2018 [employer].

## 2018-09-25 NOTE — MAU Note (Signed)
PT SAYS  AT 2 PM- FELT CRAMPING - AND URGE TO  HAVE BM- . THEN AT 7 PM- SAME URGE- WIPED  - BLOOD ON TP. Marland Kitchen  THEN AT 8 - BLOOD ON UNDERWEAR THEN NO MORE.  STILL FEELS CRAMPS.  LAST SEX- 2 MTHS.    [facility]- WITH  [facility]

## 2018-09-25 NOTE — MAU Provider Note (Signed)
Chief Complaint:  Vaginal Bleeding   First Provider Initiated Contact with Patient 09/25/18 2143     HPI: Paula Dominguez is a 37 y.o. [id number] at [redacted]w[redacted]d who presents to maternity admissions reporting vaginal bleeding and abdominal cramping. Symptoms began today. Reports red blood on toilet paper this evening. Bleeding resolved after taking a shower. Has also had some abdominal cramping & urge to have BM but didn't have one.  Denies dysuria, contractions, LOF, vaginal discharge, recent exam, or recent intercourse. Positive fetal movement.   Location: lower abdomen Quality: cramping Severity: 7/10 in pain scale Duration: 8 hours Timing: intermittent Modifying factors: none Associated signs and symptoms: vaginal bleeding  Pregnancy Course: monitored for CHTN w/o meds  Past Medical History:  Diagnosis Date  . Anxiety and depression   . Hx of preeclampsia, prior pregnancy, currently pregnant    prior pregnancy  . Hypertension    OB History  Gravida Para Term Preterm AB Living  8 6 6  0 1 6  SAB TAB Ectopic Multiple Live Births  1       6    # Outcome Date GA Lbr Len/2nd Weight Sex Delivery Anes PTL Lv  8 Current           7 Term 11/20/13 [redacted]w[redacted]d 06:10 / 00:12 2920 g M Vag-Spont EPI, Local  LIV  6 Term 12/23/10 [redacted]w[redacted]d  2722 g M Vag-Spont EPI  LIV     Birth Comments: preeclampsia  5 Term 03/29/06 [redacted]w[redacted]d  3175 g F Vag-Spont   LIV  4 SAB 2006 [redacted]w[redacted]d         3 Term 10/22/02 [redacted]w[redacted]d  3175 g M Vag-Spont   LIV  2 Term 08/10/98 [redacted]w[redacted]d  3175 g F Vag-Spont EPI  LIV  1 Term 03/23/95 [redacted]w[redacted]d  3175 g M Vag-Spont EPI  LIV   Past Surgical History:  Procedure Laterality Date  . HEMORRHOID SURGERY     Family History  Problem Relation Age of Onset  . Hypertension Other   . Cancer Other    Social History   Tobacco Use  . Smoking status: Former Smoker    Packs/day: 1.00    Years: 17.00    Pack years: 17.00    Types: Cigarettes    Last attempt to quit: 07/27/2013    Years since quitting: 5.1  .  Smokeless tobacco: Never Used  Substance Use Topics  . Alcohol use: No  . Drug use: No   No Known Allergies No medications prior to admission.    I have reviewed patient's Past Medical Hx, Surgical Hx, Family Hx, Social Hx, medications and allergies.   ROS:  Review of Systems  Constitutional: Negative.   Gastrointestinal: Positive for abdominal pain, constipation, nausea and vomiting. Negative for blood in stool.  Genitourinary: Positive for vaginal bleeding. Negative for dysuria and vaginal discharge.    Physical Exam   Patient Vitals for the past 24 hrs:  BP Temp Temp src Pulse Resp Height Weight  09/25/18 2321 129/80 98.2 F (36.8 C) Oral 77 16 - -  09/25/18 2120 136/80 98.2 F (36.8 C) Oral 81 16 5\' 6"  (1.676 m) 98.5 kg    Constitutional: Well-developed, well-nourished female in no acute distress.  Cardiovascular: normal rate & rhythm, no murmur Respiratory: normal effort, lung sounds clear throughout GI: Abd soft, non-tender, gravid appropriate for gestational age. Pos BS x 4 MS: Extremities nontender, no edema, normal ROM Neurologic: Alert and oriented x 4.  GU:      Pelvic: NEFG,  moderate amount of clumpy white discharge adherant to vaginal walls & cervix. No blood.   Dilation: Closed Effacement (%): Thick Cervical Position: Posterior Exam by:: Judeth Horn NP  NST:  Baseline: 145 bpm, Variability: Good {> 6 bpm), Accelerations: ?10x10 and Decelerations: ?sm variable vs variability   Labs: Results for orders placed or performed during the hospital encounter of 09/25/18 (from the past 24 hour(s))  Urinalysis, Routine w reflex microscopic     Status: Abnormal   Collection Time: 09/25/18  9:40 PM  Result Value Ref Range   Color, Urine STRAW (A) YELLOW   APPearance CLEAR CLEAR   Specific Gravity, Urine 1.004 (L) 1.005 - 1.030   pH 7.0 5.0 - 8.0   Glucose, UA NEGATIVE NEGATIVE mg/dL   Hgb urine dipstick SMALL (A) NEGATIVE   Bilirubin Urine NEGATIVE NEGATIVE    Ketones, ur NEGATIVE NEGATIVE mg/dL   Protein, ur NEGATIVE NEGATIVE mg/dL   Nitrite NEGATIVE NEGATIVE   Leukocytes, UA SMALL (A) NEGATIVE   RBC / HPF 0-5 0 - 5 RBC/hpf   WBC, UA 0-5 0 - 5 WBC/hpf   Bacteria, UA NONE SEEN NONE SEEN   Squamous Epithelial / LPF 0-5 0 - 5   Mucus PRESENT   Wet prep, genital     Status: Abnormal   Collection Time: 09/25/18  9:56 PM  Result Value Ref Range   Yeast Wet Prep HPF POC NONE SEEN NONE SEEN   Trich, Wet Prep NONE SEEN NONE SEEN   Clue Cells Wet Prep HPF POC PRESENT (A) NONE SEEN   WBC, Wet Prep HPF POC MODERATE (A) NONE SEEN   Sperm NONE SEEN     Imaging:  No results found.  MAU Course: Orders Placed This Encounter  Procedures  . Wet prep, genital  . [place] MFM FETAL BPP WO NON STRESS  . Urinalysis, Routine w reflex microscopic  . Discharge patient   Meds ordered this encounter  Medications  . terconazole (TERAZOL 3) 0.8 % vaginal cream    Sig: Place 1 applicator vaginally at bedtime.    Dispense:  20 g    Refill:  0    Order Specific Question:   Supervising Provider    Answer:   Levie Heritage [[id number]]    MDM: No blood on exam. Cervix closed/thick. Moderate yeast-like discharge - will tx based on exam Pt reports decreased fetal movement "for a while", & FHT ?nonreactive, will get BPP. BPP 8/8 with normal AFI  Assessment: 1. Vaginal yeast infection   2. Non-reactive NST (non-stress test)   3. [redacted] weeks gestation of pregnancy   4. Decreased fetal movement affecting management of pregnancy in third trimester, single or unspecified fetus     Plan: Discharge home in stable condition.  Preterm Labor precautions and fetal kick counts   Allergies as of 09/25/2018   No Known Allergies     Medication List    STOP taking these medications   cefUROXime 500 MG tablet Commonly known as:  CEFTIN     TAKE these medications   amLODipine 5 MG tablet Commonly known as:  NORVASC Take 1 tablet (5 mg total) by mouth daily.    butalbital-acetaminophen-caffeine 50-325-40 MG tablet Commonly known as:  FIORICET, ESGIC Take 2 tablets by mouth every 8 (eight) hours as needed for headache.   COMFORT FIT MATERNITY SUPP SM Misc Wear as directed.   ferrous fumarate 325 (106 Fe) MG Tabs tablet Commonly known as:  HEMOCYTE - 106 mg FE Take 1  tablet (106 mg of iron total) by mouth every morning.   omeprazole 20 MG capsule Commonly known as:  PRILOSEC Take 1 capsule (20 mg total) by mouth daily as needed (GERD).   ondansetron 8 MG tablet Commonly known as:  ZOFRAN Take 1 tablet (8 mg total) by mouth every 8 (eight) hours as needed for nausea.   promethazine 25 MG tablet Commonly known as:  PHENERGAN Take 1 tablet (25 mg total) by mouth every 6 (six) hours as needed for nausea or vomiting.   terconazole 0.8 % vaginal cream Commonly known as:  TERAZOL 3 Place 1 applicator vaginally at bedtime.   VITAFOL ULTRA 29-0.6-0.4-200 MG Caps Take 1 capsule by mouth daily before breakfast.       Judeth Horn, NP 09/25/2018 11:57 PM

## 2018-10-03 ENCOUNTER — Ambulatory Visit (HOSPITAL_COMMUNITY): Payer: BLUE CROSS/BLUE SHIELD

## 2018-10-08 ENCOUNTER — Ambulatory Visit (HOSPITAL_COMMUNITY)
Admission: RE | Admit: 2018-10-08 | Discharge: 2018-10-08 | Disposition: A | Discharge status: Home / Self Care | Payer: BLUE CROSS/BLUE SHIELD | Source: Ambulatory Visit | Attending: Family Medicine | Admitting: Family Medicine

## 2018-10-08 ENCOUNTER — Encounter (HOSPITAL_COMMUNITY): Payer: Self-pay

## 2018-10-08 ENCOUNTER — Encounter: Payer: BLUE CROSS/BLUE SHIELD | Admitting: Obstetrics

## 2018-10-08 DIAGNOSIS — O10013 Pre-existing essential hypertension complicating pregnancy, third trimester: Secondary | ICD-10-CM | POA: Insufficient documentation

## 2018-10-08 DIAGNOSIS — Z3A32 32 weeks gestation of pregnancy: Secondary | ICD-10-CM

## 2018-10-08 DIAGNOSIS — Z362 Encounter for other antenatal screening follow-up: Secondary | ICD-10-CM | POA: Diagnosis not present

## 2018-10-08 DIAGNOSIS — O10919 Unspecified pre-existing hypertension complicating pregnancy, unspecified trimester: Secondary | ICD-10-CM

## 2018-10-08 DIAGNOSIS — O09523 Supervision of elderly multigravida, third trimester: Secondary | ICD-10-CM

## 2018-10-08 DIAGNOSIS — O10913 Unspecified pre-existing hypertension complicating pregnancy, third trimester: Secondary | ICD-10-CM | POA: Diagnosis not present

## 2018-10-10 ENCOUNTER — Other Ambulatory Visit: Payer: Self-pay

## 2018-10-10 DIAGNOSIS — O219 Vomiting of pregnancy, unspecified: Secondary | ICD-10-CM

## 2018-10-11 ENCOUNTER — Other Ambulatory Visit: Payer: Self-pay

## 2018-10-11 ENCOUNTER — Ambulatory Visit (INDEPENDENT_AMBULATORY_CARE_PROVIDER_SITE_OTHER): Payer: BLUE CROSS/BLUE SHIELD | Admitting: Obstetrics

## 2018-10-11 ENCOUNTER — Other Ambulatory Visit: Payer: BLUE CROSS/BLUE SHIELD

## 2018-10-11 ENCOUNTER — Encounter: Payer: Self-pay | Admitting: Obstetrics

## 2018-10-11 VITALS — BP 148/100 | HR 98 | Wt 215.9 lb

## 2018-10-11 DIAGNOSIS — O99210 Obesity complicating pregnancy, unspecified trimester: Secondary | ICD-10-CM

## 2018-10-11 DIAGNOSIS — Z3481 Encounter for supervision of other normal pregnancy, first trimester: Secondary | ICD-10-CM

## 2018-10-11 DIAGNOSIS — O0990 Supervision of high risk pregnancy, unspecified, unspecified trimester: Secondary | ICD-10-CM

## 2018-10-11 DIAGNOSIS — O10919 Unspecified pre-existing hypertension complicating pregnancy, unspecified trimester: Secondary | ICD-10-CM

## 2018-10-11 DIAGNOSIS — O09529 Supervision of elderly multigravida, unspecified trimester: Secondary | ICD-10-CM

## 2018-10-11 MED ORDER — LABETALOL HCL 200 MG PO TABS: 200.0000 mg | ORAL_TABLET | Freq: Two times a day (BID) | ORAL | 3 refills | Status: DC

## 2018-10-11 NOTE — Progress Notes (Signed)
Subjective:  Paula Dominguez is a 37 y.o. [id number] at [redacted]w[redacted]d being seen today for ongoing prenatal care.  She is currently monitored for the following issues for this high-risk pregnancy and has Hx of preeclampsia, prior pregnancy, currently pregnant; Post partum depression; Panic disorder with agoraphobia and moderate panic attacks; Supervision of normal pregnancy, antepartum; Chronic hypertension during pregnancy, antepartum; and Grand multiparity on their problem list.  Patient reports carpal tunnel symptoms, headache, occasional contractions and nosebleeds.  Contractions: Irritability. Vag. Bleeding: None.  Movement: Present. Denies leaking of fluid.   The following portions of the patient's history were reviewed and updated as appropriate: allergies, current medications, past family history, past medical history, past social history, past surgical history and problem list. Problem list updated.  Objective:   Vitals:   10/11/18 0953 10/11/18 0956  BP: (!) 143/96 (!) 148/100  Pulse: 94 98  Weight: 215 lb 14.4 oz (97.9 kg)     Fetal Status: Fetal Heart Rate (bpm): NST-R   Movement: Present     General:  Alert, oriented and cooperative. Patient is in no acute distress.  Skin: Skin is warm and dry. No rash noted.   Cardiovascular: Normal heart rate noted  Respiratory: Normal respiratory effort, no problems with respiration noted  Abdomen: Soft, gravid, appropriate for gestational age. Pain/Pressure: Present     Pelvic:  Cervical exam deferred        Extremities: Normal range of motion.  Edema: Trace  Mental Status: Normal mood and affect. Normal behavior. Normal judgment and thought content.   Urinalysis:      Assessment and Plan:  Pregnancy: [id number] at [redacted]w[redacted]d  1. Supervision of high risk pregnancy, antepartum  2. Supervision of elderly multigravida, antepartum  3. Chronic hypertension during pregnancy, antepartum Rx: - Fetal nonstress test; Future - labetalol (NORMODYNE) 200 MG  tablet; Take 1 tablet (200 mg total) by mouth 2 (two) times daily.  Dispense: 60 tablet; Refill: 3  4. Obesity affecting pregnancy, antepartum   Preterm labor symptoms and general obstetric precautions including but not limited to vaginal bleeding, contractions, leaking of fluid and fetal movement were reviewed in detail with the patient. Please refer to After Visit Summary for other counseling recommendations.  Return in about 1 week (around 10/18/2018) for ROB.   Brock Bad, MD

## 2018-10-11 NOTE — Progress Notes (Signed)
ROB.  C/o NV, headaches, dizziness and auroras x month. Noose bleeds x 2 weeks  Bp elevated.  R 143/96 P 94;  L 148/100  P 98

## 2018-10-11 NOTE — Progress Notes (Signed)
Pt is compliant with Modified Bedrest.

## 2018-10-15 ENCOUNTER — Encounter (HOSPITAL_COMMUNITY): Payer: Self-pay

## 2018-10-15 ENCOUNTER — Ambulatory Visit (HOSPITAL_COMMUNITY)
Admission: RE | Admit: 2018-10-15 | Discharge: 2018-10-15 | Disposition: A | Discharge status: Home / Self Care | Payer: BLUE CROSS/BLUE SHIELD | Source: Ambulatory Visit | Attending: Obstetrics | Admitting: Obstetrics

## 2018-10-15 DIAGNOSIS — O09523 Supervision of elderly multigravida, third trimester: Secondary | ICD-10-CM

## 2018-10-15 DIAGNOSIS — O10919 Unspecified pre-existing hypertension complicating pregnancy, unspecified trimester: Secondary | ICD-10-CM

## 2018-10-15 DIAGNOSIS — O10013 Pre-existing essential hypertension complicating pregnancy, third trimester: Secondary | ICD-10-CM | POA: Diagnosis not present

## 2018-10-15 DIAGNOSIS — Z3A33 33 weeks gestation of pregnancy: Secondary | ICD-10-CM

## 2018-10-18 ENCOUNTER — Other Ambulatory Visit: Payer: Self-pay

## 2018-10-18 ENCOUNTER — Ambulatory Visit (INDEPENDENT_AMBULATORY_CARE_PROVIDER_SITE_OTHER): Payer: BLUE CROSS/BLUE SHIELD | Admitting: Obstetrics

## 2018-10-18 ENCOUNTER — Encounter: Payer: Self-pay | Admitting: Obstetrics

## 2018-10-18 ENCOUNTER — Encounter (HOSPITAL_COMMUNITY): Payer: Self-pay | Admitting: *Deleted

## 2018-10-18 ENCOUNTER — Inpatient Hospital Stay (HOSPITAL_COMMUNITY)
Admission: AD | Admit: 2018-10-18 | Discharge: 2018-10-18 | Disposition: A | Discharge status: Home / Self Care | Payer: BLUE CROSS/BLUE SHIELD | Source: Ambulatory Visit | Attending: Obstetrics and Gynecology | Admitting: Obstetrics and Gynecology

## 2018-10-18 VITALS — BP 150/93 | HR 87 | Wt 218.0 lb

## 2018-10-18 DIAGNOSIS — O10913 Unspecified pre-existing hypertension complicating pregnancy, third trimester: Secondary | ICD-10-CM | POA: Diagnosis not present

## 2018-10-18 DIAGNOSIS — Z87891 Personal history of nicotine dependence: Secondary | ICD-10-CM | POA: Insufficient documentation

## 2018-10-18 DIAGNOSIS — Z3A33 33 weeks gestation of pregnancy: Secondary | ICD-10-CM | POA: Insufficient documentation

## 2018-10-18 DIAGNOSIS — O10013 Pre-existing essential hypertension complicating pregnancy, third trimester: Secondary | ICD-10-CM | POA: Insufficient documentation

## 2018-10-18 DIAGNOSIS — O0990 Supervision of high risk pregnancy, unspecified, unspecified trimester: Secondary | ICD-10-CM

## 2018-10-18 DIAGNOSIS — O09299 Supervision of pregnancy with other poor reproductive or obstetric history, unspecified trimester: Secondary | ICD-10-CM

## 2018-10-18 DIAGNOSIS — O10919 Unspecified pre-existing hypertension complicating pregnancy, unspecified trimester: Secondary | ICD-10-CM

## 2018-10-18 DIAGNOSIS — O09529 Supervision of elderly multigravida, unspecified trimester: Secondary | ICD-10-CM

## 2018-10-18 LAB — COMPREHENSIVE METABOLIC PANEL
ALT: 9 U/L (ref 0–44)
ANION GAP: 9 (ref 5–15)
AST: 15 U/L (ref 15–41)
Albumin: 3 g/dL — ABNORMAL LOW (ref 3.5–5.0)
Alkaline Phosphatase: 99 U/L (ref 38–126)
BILIRUBIN TOTAL: 0.4 mg/dL (ref 0.3–1.2)
CO2: 22 mmol/L (ref 22–32)
Calcium: 8.6 mg/dL — ABNORMAL LOW (ref 8.9–10.3)
Chloride: 105 mmol/L (ref 98–111)
Creatinine, Ser: 0.42 mg/dL — ABNORMAL LOW (ref 0.44–1.00)
GFR calc Af Amer: >60 mL/min (ref >60)
GFR calc non Af Amer: >60 mL/min (ref >60)
Glucose, Bld: 87 mg/dL (ref 70–99)
POTASSIUM: 3.1 mmol/L — AB (ref 3.5–5.1)
Sodium: 136 mmol/L (ref 135–145)
TOTAL PROTEIN: 6.1 g/dL — AB (ref 6.5–8.1)

## 2018-10-18 LAB — CBC
HEMATOCRIT: 30.3 % — AB (ref 36.0–46.0)
Hematocrit: 30.5 % — ABNORMAL LOW (ref 34.0–46.6)
Hemoglobin: 10.7 g/dL — ABNORMAL LOW (ref 11.1–15.9)
Hemoglobin: 10.5 g/dL — ABNORMAL LOW (ref 12.0–15.0)
MCH: 33.1 pg — ABNORMAL HIGH (ref 26.6–33.0)
MCH: 32.3 pg (ref 26.0–34.0)
MCHC: 35.1 g/dL (ref 31.5–35.7)
MCHC: 34.7 g/dL (ref 30.0–36.0)
MCV: 94 fL (ref 79–97)
MCV: 93.2 fL (ref 80.0–100.0)
PLATELETS: 236 10*3/uL (ref 150–450)
Platelets: 234 10*3/uL (ref 150–400)
RBC: 3.23 x10E6/uL — ABNORMAL LOW (ref 3.77–5.28)
RBC: 3.25 MIL/uL — ABNORMAL LOW (ref 3.87–5.11)
RDW: 14.3 % (ref 12.3–15.4)
RDW: 13.8 % (ref 11.5–15.5)
WBC: 9.7 10*3/uL (ref 3.4–10.8)
WBC: 11.2 10*3/uL — ABNORMAL HIGH (ref 4.0–10.5)
nRBC: 0 % (ref 0.0–0.2)

## 2018-10-18 LAB — AST: AST: 12 IU/L (ref 0–40)

## 2018-10-18 LAB — ALT: ALT: 6 IU/L (ref 0–32)

## 2018-10-18 LAB — CREATININE, SERUM
CREATININE: 0.44 mg/dL — AB (ref 0.57–1.00)
GFR calc non Af Amer: 128 mL/min/{1.73_m2} (ref >59)
GFR, EST AFRICAN AMERICAN: 148 mL/min/{1.73_m2} (ref >59)

## 2018-10-18 LAB — PROTEIN / CREATININE RATIO, URINE
CREATININE, UR: 202.4 mg/dL
CREATININE, URINE: 346 mg/dL
Protein Creatinine Ratio: 0.07 mg/mg{Cre} (ref 0.00–0.15)
Protein, Ur: 33.5 mg/dL
Protein/Creat Ratio: 166 mg/g creat (ref 0–200)
Total Protein, Urine: 25 mg/dL

## 2018-10-18 LAB — LACTATE DEHYDROGENASE: LDH: 195 IU/L (ref 119–226)

## 2018-10-18 MED ORDER — LABETALOL HCL 100 MG PO TABS
200.0000 mg | ORAL_TABLET | Freq: Once | ORAL | Status: AC
  Administered 2018-10-18: 200 mg via ORAL
  Filled 2018-10-18: qty 2

## 2018-10-18 MED ORDER — BUTALBITAL-APAP-CAFFEINE 50-325-40 MG PO TABS
2.0000 | ORAL_TABLET | Freq: Once | ORAL | Status: AC
  Administered 2018-10-18: 2 via ORAL
  Filled 2018-10-18: qty 2

## 2018-10-18 MED ORDER — BETAMETHASONE SOD PHOS & ACET 6 (3-3) MG/ML IJ SUSP
12.0000 mg | Freq: Once | INTRAMUSCULAR | Status: AC
  Administered 2018-10-18: 12 mg via INTRAMUSCULAR
  Filled 2018-10-18: qty 2

## 2018-10-18 MED ORDER — FAMOTIDINE 20 MG PO TABS
40.0000 mg | ORAL_TABLET | Freq: Once | ORAL | Status: AC
  Administered 2018-10-18: 40 mg via ORAL
  Filled 2018-10-18: qty 2

## 2018-10-18 MED ORDER — LABETALOL HCL 200 MG PO TABS: 400.0000 mg | ORAL_TABLET | Freq: Two times a day (BID) | ORAL | 4 refills | Status: DC

## 2018-10-18 NOTE — Progress Notes (Signed)
Pt c/o heartburn, requesting meds. 

## 2018-10-18 NOTE — Discharge Instructions (Signed)

## 2018-10-18 NOTE — Progress Notes (Signed)
ROB/NST.  Elevated BP 150/93 P 87   C/o headaches, dizziness, vomiting and nose bleeds.  Placed on NST in [facility].

## 2018-10-18 NOTE — MAU Provider Note (Signed)
History     CSN: [id number]  Arrival date and time: 10/18/18 1120   First Provider Initiated Contact with Patient 10/18/18 1205      Chief Complaint  Patient presents with  . Hypertension  . EFM   Paula Dominguez is a 37 y.o. [id number] at [redacted]w[redacted]d who presents today from the office for evaluation for high blood pressure. She has CHTN, and she is currently on Norvasc 5mg  qd and labetalol 200mg  BID. She reports that she is only taking labetalol 200mg  once per day as she did not know she was supposed to take it twice per day, and she is unable to afford this medication as it is not covered by her insurance. She reports that she has been able to buy a few pills at a time, but it generally costs about $200. She denies any contractions, VB or LOF. She reports normal fetal movement. She has a headache, but she has not eaten today and believes the headache is from that.   Hypertension  This is a new problem. The current episode started today. The problem is unchanged. The problem is uncontrolled. Associated symptoms include headaches. Associated agents: pregnancy  Past treatments include calcium channel blockers and beta blockers. The current treatment provides mild improvement. Compliance problems include medication cost.     OB History    Gravida  8   Para  6   Term  6   Preterm  0   AB  1   Living  6     SAB  1   TAB      Ectopic      Multiple      Live Births  6           Past Medical History:  Diagnosis Date  . Anxiety and depression   . Hx of preeclampsia, prior pregnancy, currently pregnant    prior pregnancy  . Hypertension     Past Surgical History:  Procedure Laterality Date  . HEMORRHOID SURGERY      Family History  Problem Relation Age of Onset  . Healthy Mother   . Healthy Father   . Hypertension Other   . Cancer Other     Social History   Tobacco Use  . Smoking status: Former Smoker    Packs/day: 1.00    Years: 17.00    Pack years: 17.00     Types: Cigarettes    Last attempt to quit: 07/27/2013    Years since quitting: 5.2  . Smokeless tobacco: Never Used  Substance Use Topics  . Alcohol use: No  . Drug use: No    Allergies: No Known Allergies  Medications Prior to Admission  Medication Sig Dispense Refill Last Dose  . amLODipine (NORVASC) 5 MG tablet Take 1 tablet (5 mg total) by mouth daily. 30 tablet 11 10/18/2018 at Unknown time  . butalbital-acetaminophen-caffeine (FIORICET, ESGIC) 50-325-40 MG tablet Take 2 tablets by mouth every 8 (eight) hours as needed for headache. 30 tablet 2 10/18/2018 at Unknown time  . famotidine (PEPCID) 20 MG tablet Take 20 mg by mouth 2 (two) times daily.   10/17/2018 at Unknown time  . ferrous fumarate (HEMOCYTE - 106 MG FE) 325 (106 Fe) MG TABS tablet Take 1 tablet (106 mg of iron total) by mouth every morning. 30 each 11 10/18/2018 at Unknown time  . labetalol (NORMODYNE) 200 MG tablet Take 2 tablets (400 mg total) by mouth 2 (two) times daily. 120 tablet 4 10/18/2018 at 0900  .  omeprazole (PRILOSEC) 20 MG capsule Take 1 capsule (20 mg total) by mouth daily as needed (GERD). 60 capsule 5 10/18/2018 at Unknown time  . ondansetron (ZOFRAN) 8 MG tablet Take 1 tablet (8 mg total) by mouth every 8 (eight) hours as needed for nausea. 30 tablet 0 10/18/2018 at Unknown time  . Prenat-Fe Poly-Methfol-FA-DHA (VITAFOL ULTRA) 29-0.6-0.4-200 MG CAPS Take 1 capsule by mouth daily before breakfast. (Patient taking differently: Take 1 capsule by mouth at bedtime. ) 90 capsule 4 10/17/2018 at Unknown time  . promethazine (PHENERGAN) 25 MG tablet Take 1 tablet (25 mg total) by mouth every 6 (six) hours as needed for nausea or vomiting. 30 tablet 1 10/18/2018 at Unknown time  . ranitidine (ZANTAC) 150 MG tablet Take 150 mg by mouth 2 (two) times daily.  5 10/18/2018 at Unknown time  . Elastic Bandages & Supports (COMFORT FIT MATERNITY SUPP SM) MISC Wear as directed. 1 each 0 Taking  . terconazole (TERAZOL 3)  0.8 % vaginal cream Place 1 applicator vaginally at bedtime. (Patient not taking: Reported on 10/18/2018) 20 g 0 Completed Course at Unknown time    Review of Systems  Constitutional: Negative for chills and fever.  Eyes: Negative for visual disturbance.  Gastrointestinal: Negative for abdominal pain and vomiting.  Genitourinary: Negative for pelvic pain, vaginal bleeding and vaginal discharge.  Neurological: Positive for headaches.   Physical Exam   Blood pressure (!) 144/91, pulse 89, temperature 97.9 F (36.6 C), temperature source Oral, resp. rate 18, SpO2 98 %.  Physical Exam  Nursing note and vitals reviewed. Constitutional: She is oriented to person, place, and time. She appears well-developed and well-nourished. No distress.  HENT:  Head: Normocephalic.  Cardiovascular: Normal rate.  Respiratory: Effort normal.  GI: Soft. There is no tenderness. There is no rebound.  Neurological: She is alert and oriented to person, place, and time.  Skin: Skin is warm and dry.  Psychiatric: She has a normal mood and affect.   NST:  Baseline: 135 Variability: moderate Accels: 15x15 Decels: none Toco: none  Results for orders placed or performed during the hospital encounter of 10/18/18 (from the past 24 hour(s))  Protein / creatinine ratio, urine     Status: None   Collection Time: 10/18/18 12:25 PM  Result Value Ref Range   Creatinine, Urine 346.00 mg/dL   Total Protein, Urine 25 mg/dL   Protein Creatinine Ratio 0.07 0.00 - 0.15 mg/mg[Cre]  CBC     Status: Abnormal   Collection Time: 10/18/18 12:33 PM  Result Value Ref Range   WBC 11.2 (H) 4.0 - 10.5 K/uL   RBC 3.25 (L) 3.87 - 5.11 MIL/uL   Hemoglobin 10.5 (L) 12.0 - 15.0 g/dL   HCT 40.9 (L) 81.1 - 91.4 %   MCV 93.2 80.0 - 100.0 fL   MCH 32.3 26.0 - 34.0 pg   MCHC 34.7 30.0 - 36.0 g/dL   RDW 78.2 95.6 - 21.3 %   Platelets 234 150 - 400 K/uL   nRBC 0.0 0.0 - 0.2 %  Comprehensive metabolic panel     Status: Abnormal    Collection Time: 10/18/18 12:33 PM  Result Value Ref Range   Sodium 136 135 - 145 mmol/L   Potassium 3.1 (L) 3.5 - 5.1 mmol/L   Chloride 105 98 - 111 mmol/L   CO2 22 22 - 32 mmol/L   Glucose, Bld 87 70 - 99 mg/dL   BUN <5 (L) 6 - 20 mg/dL   Creatinine, Ser 0.86 (  L) 0.44 - 1.00 mg/dL   Calcium 8.6 (L) 8.9 - 10.3 mg/dL   Total Protein 6.1 (L) 6.5 - 8.1 g/dL   Albumin 3.0 (L) 3.5 - 5.0 g/dL   AST 15 15 - 41 U/L   ALT 9 0 - 44 U/L   Alkaline Phosphatase 99 38 - 126 U/L   Total Bilirubin 0.4 0.3 - 1.2 mg/dL   GFR calc non Af Amer >60 >60 mL/min   GFR calc Af Amer >60 >60 mL/min   Anion gap 9 5 - 15   MAU Course  Procedures  MDM  Patient has had lunch and fioricet. She reports that her headache has improved.   2:19 PM Consult with Dr. Alysia Penna, since patient has not been compliant with current labetalol. Will not increase dose at this time. Will get patient to take 200mg  BID as orginially rx. Will give BMZ today and have patient return for 2nd betamethasone tomorrow.  Assessment and Plan   1. Chronic hypertension with exacerbation during pregnancy in third trimester   2. [redacted] weeks gestation of pregnancy   3. Hx of preeclampsia, prior pregnancy, currently pregnant    DC home Comfort measures reviewed  3rd Trimester precautions  Pre-eclampsia warning signs reviewed  PTL precautions  Fetal kick counts RX: no new RX. Patient advised that she needs to start taking her labetalol twice per day so we can monitor how it is helping with her blood pressure. She is agreeable, and I have sent a message to Dr. Clearance Coots to assist with prior authorization so that hopefully her insurance will cover this medication.  Patient to return tomorrow for 2nd BMZ injection Return to MAU as needed FU with OB as planned  Follow-up Information    [facility] Follow up.   Specialty:  Maternal and Fetal Medicine Contact information: [address] [id number] mc [place]  [place] [address] [phone]       [facility] Follow up.   Why:  Return tomorrow for second steriod injection  Contact information: [address] [id number] mc [place] [address] [phone]           Thressa Sheller 10/18/2018, 2:19 PM

## 2018-10-18 NOTE — Progress Notes (Signed)
Subjective:  Paula Dominguez is a 37 y.o. [id number] at [redacted]w[redacted]d being seen today for ongoing prenatal care.  She is currently monitored for the following issues for this high-risk pregnancy and has Hx of preeclampsia, prior pregnancy, currently pregnant; Post partum depression; Panic disorder with agoraphobia and moderate panic attacks; Supervision of normal pregnancy, antepartum; Chronic hypertension during pregnancy, antepartum; and Grand multiparity on their problem list.  Patient reports headache, nausea and vomiting.  Contractions: Irregular. Vag. Bleeding: None.  Movement: Present. Denies leaking of fluid.   The following portions of the patient's history were reviewed and updated as appropriate: allergies, current medications, past family history, past medical history, past social history, past surgical history and problem list. Problem list updated.  Objective:   Vitals:   10/18/18 0942  BP: (!) 150/93  Pulse: 87  Weight: 218 lb (98.9 kg)    Fetal Status: Fetal Heart Rate (bpm): NST-NR   Movement: Present     General:  Alert, oriented and cooperative. Patient is in no acute distress.  Skin: Skin is warm and dry. No rash noted.   Cardiovascular: Normal heart rate noted  Respiratory: Normal respiratory effort, no problems with respiration noted  Abdomen: Soft, gravid, appropriate for gestational age. Pain/Pressure: Present     Pelvic:  Cervical exam deferred        Extremities: Normal range of motion.  Edema: Trace  Mental Status: Normal mood and affect. Normal behavior. Normal judgment and thought content.   Urinalysis:      NST:  Non Reactive.  Fair variability.  5-10 beat accels.  Occasional variable decel  Assessment and Plan:  Pregnancy: [id number] at [redacted]w[redacted]d  1. Supervision of high risk pregnancy, antepartum  2. Supervision of elderly multigravida, antepartum  3. Chronic hypertension during pregnancy, antepartum Rx: - Fetal nonstress test; Future - Lactate dehydrogenase -  Creatinine, serum - CBC - ALT - AST - Protein / creatinine ratio, urine - labetalol (NORMODYNE) 200 MG tablet; Take 2 tablets (400 mg total) by mouth 2 (two) times daily.  Dispense: 120 tablet; Refill: 4  Patient sent to [facility] for further evaluation.  Preterm labor symptoms and general obstetric precautions including but not limited to vaginal bleeding, contractions, leaking of fluid and fetal movement were reviewed in detail with the patient. Please refer to After Visit Summary for other counseling recommendations.  Return in about 1 week (around 10/25/2018) for ROB.   Brock Bad, MD

## 2018-10-18 NOTE — MAU Note (Signed)
Pt sent from office for eval of elevated BP and nonreactive NST.  Pt currently has H/A and visual disturbances.  Denies VB or LOF. Reports +FM.

## 2018-10-19 ENCOUNTER — Inpatient Hospital Stay (HOSPITAL_COMMUNITY)
Admission: AD | Admit: 2018-10-19 | Discharge: 2018-10-19 | Disposition: A | Discharge status: Home / Self Care | Payer: BLUE CROSS/BLUE SHIELD | Source: Ambulatory Visit | Attending: Obstetrics and Gynecology | Admitting: Obstetrics and Gynecology

## 2018-10-19 DIAGNOSIS — Z3A33 33 weeks gestation of pregnancy: Secondary | ICD-10-CM | POA: Insufficient documentation

## 2018-10-19 DIAGNOSIS — O10913 Unspecified pre-existing hypertension complicating pregnancy, third trimester: Secondary | ICD-10-CM | POA: Diagnosis not present

## 2018-10-19 MED ORDER — BETAMETHASONE SOD PHOS & ACET 6 (3-3) MG/ML IJ SUSP
12.0000 mg | Freq: Once | INTRAMUSCULAR | Status: AC
  Administered 2018-10-19: 12 mg via INTRAMUSCULAR
  Filled 2018-10-19: qty 2

## 2018-10-19 NOTE — MAU Note (Signed)
Here for 2nd dose of Betamethasone.  No problems yesterday with first.. No complaints.

## 2018-10-22 ENCOUNTER — Ambulatory Visit (HOSPITAL_COMMUNITY)
Admission: RE | Admit: 2018-10-22 | Discharge: 2018-10-22 | Disposition: A | Discharge status: Home / Self Care | Payer: BLUE CROSS/BLUE SHIELD | Source: Ambulatory Visit | Attending: Obstetrics | Admitting: Obstetrics

## 2018-10-22 ENCOUNTER — Other Ambulatory Visit: Payer: Self-pay | Admitting: Obstetrics

## 2018-10-22 ENCOUNTER — Encounter (HOSPITAL_COMMUNITY): Payer: Self-pay

## 2018-10-22 DIAGNOSIS — O10913 Unspecified pre-existing hypertension complicating pregnancy, third trimester: Secondary | ICD-10-CM | POA: Diagnosis not present

## 2018-10-22 DIAGNOSIS — Z3A34 34 weeks gestation of pregnancy: Secondary | ICD-10-CM | POA: Diagnosis not present

## 2018-10-22 DIAGNOSIS — O10013 Pre-existing essential hypertension complicating pregnancy, third trimester: Secondary | ICD-10-CM

## 2018-10-22 DIAGNOSIS — O09523 Supervision of elderly multigravida, third trimester: Secondary | ICD-10-CM | POA: Insufficient documentation

## 2018-10-22 DIAGNOSIS — O10919 Unspecified pre-existing hypertension complicating pregnancy, unspecified trimester: Secondary | ICD-10-CM

## 2018-10-22 MED ORDER — ONDANSETRON HCL 8 MG PO TABS: 8.0000 mg | ORAL_TABLET | Freq: Three times a day (TID) | ORAL | 0 refills | Status: DC | PRN

## 2018-10-22 NOTE — Telephone Encounter (Signed)
Please review for refill.  

## 2018-10-25 ENCOUNTER — Ambulatory Visit (INDEPENDENT_AMBULATORY_CARE_PROVIDER_SITE_OTHER): Payer: BLUE CROSS/BLUE SHIELD | Admitting: Obstetrics

## 2018-10-25 ENCOUNTER — Encounter: Payer: Self-pay | Admitting: Obstetrics

## 2018-10-25 VITALS — BP 143/98 | HR 92 | Wt 216.2 lb

## 2018-10-25 DIAGNOSIS — O10913 Unspecified pre-existing hypertension complicating pregnancy, third trimester: Secondary | ICD-10-CM

## 2018-10-25 DIAGNOSIS — O09523 Supervision of elderly multigravida, third trimester: Secondary | ICD-10-CM

## 2018-10-25 DIAGNOSIS — O09529 Supervision of elderly multigravida, unspecified trimester: Secondary | ICD-10-CM

## 2018-10-25 DIAGNOSIS — O99210 Obesity complicating pregnancy, unspecified trimester: Secondary | ICD-10-CM

## 2018-10-25 DIAGNOSIS — O10919 Unspecified pre-existing hypertension complicating pregnancy, unspecified trimester: Secondary | ICD-10-CM

## 2018-10-25 DIAGNOSIS — O99213 Obesity complicating pregnancy, third trimester: Secondary | ICD-10-CM

## 2018-10-25 MED ORDER — LABETALOL HCL 200 MG PO TABS: 400.0000 mg | ORAL_TABLET | Freq: Two times a day (BID) | ORAL | 4 refills | Status: DC

## 2018-10-25 MED ORDER — LABETALOL HCL 200 MG PO TABS: 600.0000 mg | ORAL_TABLET | Freq: Two times a day (BID) | ORAL | 4 refills | Status: DC

## 2018-10-25 NOTE — Progress Notes (Signed)
Subjective:  Paula Dominguez is a 37 y.o. [id number] at [redacted]w[redacted]d being seen today for ongoing prenatal care.  She is currently monitored for the following issues for this high-risk pregnancy and has Hx of preeclampsia, prior pregnancy, currently pregnant; Post partum depression; Panic disorder with agoraphobia and moderate panic attacks; Supervision of normal pregnancy, antepartum; Chronic hypertension during pregnancy, antepartum; and Grand multiparity on their problem list.  Patient reports headache.  Contractions: Irregular. Vag. Bleeding: None.  Movement: Present. Denies leaking of fluid.   The following portions of the patient's history were reviewed and updated as appropriate: allergies, current medications, past family history, past medical history, past social history, past surgical history and problem list. Problem list updated.  Objective:   Vitals:   10/25/18 0954 10/25/18 0957  BP: (!) 148/90 (!) 143/98  Pulse: 86 92  Weight: 216 lb 3.2 oz (98.1 kg)     Fetal Status: Fetal Heart Rate (bpm): NST   Movement: Present     General:  Alert, oriented and cooperative. Patient is in no acute distress.  Skin: Skin is warm and dry. No rash noted.   Cardiovascular: Normal heart rate noted  Respiratory: Normal respiratory effort, no problems with respiration noted  Abdomen: Soft, gravid, appropriate for gestational age. Pain/Pressure: Present     Pelvic:  Cervical exam deferred        Extremities: Normal range of motion.  Edema: Trace  Mental Status: Normal mood and affect. Normal behavior. Normal judgment and thought content.   Urinalysis:      Assessment and Plan:  Pregnancy: [id number] at [redacted]w[redacted]d  1. Supervision of elderly multigravida, antepartum Rx: - Fetal nonstress test; Future  2. Chronic hypertension during pregnancy, antepartum - stable BP's, on meds  3. Obesity affecting pregnancy, antepartum   Preterm labor symptoms and general obstetric precautions including but not limited  to vaginal bleeding, contractions, leaking of fluid and fetal movement were reviewed in detail with the patient. Please refer to After Visit Summary for other counseling recommendations.  Return in about 1 week (around 11/01/2018) for ROB.   Brock Bad, MD

## 2018-10-25 NOTE — Progress Notes (Signed)
Patient reports some fetal movement with irregular contractions and pressure. Pt reports headaches, dizziness, and occasionally sees spots.

## 2018-10-27 ENCOUNTER — Inpatient Hospital Stay (HOSPITAL_COMMUNITY)
Admission: AD | Admit: 2018-10-27 | Discharge: 2018-10-27 | Disposition: A | Discharge status: Home / Self Care | Payer: BLUE CROSS/BLUE SHIELD | Attending: Obstetrics and Gynecology | Admitting: Obstetrics and Gynecology

## 2018-10-27 ENCOUNTER — Encounter (HOSPITAL_COMMUNITY): Payer: Self-pay | Admitting: *Deleted

## 2018-10-27 DIAGNOSIS — O10013 Pre-existing essential hypertension complicating pregnancy, third trimester: Secondary | ICD-10-CM | POA: Diagnosis not present

## 2018-10-27 DIAGNOSIS — Z87891 Personal history of nicotine dependence: Secondary | ICD-10-CM | POA: Insufficient documentation

## 2018-10-27 DIAGNOSIS — O4703 False labor before 37 completed weeks of gestation, third trimester: Secondary | ICD-10-CM | POA: Diagnosis not present

## 2018-10-27 DIAGNOSIS — Z3A35 35 weeks gestation of pregnancy: Secondary | ICD-10-CM

## 2018-10-27 LAB — URINALYSIS, ROUTINE W REFLEX MICROSCOPIC
BACTERIA UA: NONE SEEN
GLUCOSE, UA: NEGATIVE mg/dL
Hgb urine dipstick: NEGATIVE
KETONES UR: 5 mg/dL — AB
Leukocytes, UA: NEGATIVE
Nitrite: NEGATIVE
PROTEIN: 30 mg/dL — AB
Specific Gravity, Urine: 1.029 (ref 1.005–1.030)
pH: 6 (ref 5.0–8.0)

## 2018-10-27 NOTE — Discharge Instructions (Signed)
Braxton Hicks Contractions °Contractions of the uterus can occur throughout pregnancy, but they are not always a sign that you are in labor. You may have practice contractions called Braxton Hicks contractions. These false labor contractions are sometimes confused with true labor. °What are Braxton Hicks contractions? °Braxton Hicks contractions are tightening movements that occur in the muscles of the uterus before labor. Unlike true labor contractions, these contractions do not result in opening (dilation) and thinning of the cervix. Toward the end of pregnancy (32-34 weeks), Braxton Hicks contractions can happen more often and may become stronger. These contractions are sometimes difficult to tell apart from true labor because they can be very uncomfortable. You should not feel embarrassed if you go to the hospital with false labor. °Sometimes, the only way to tell if you are in true labor is for your health care provider to look for changes in the cervix. The health care provider will do a physical exam and may monitor your contractions. If you are not in true labor, the exam should show that your cervix is not dilating and your water has not broken. °If there are other health problems associated with your pregnancy, it is completely safe for you to be sent home with false labor. You may continue to have Braxton Hicks contractions until you go into true labor. °How to tell the difference between true labor and false labor °True labor °· Contractions last 30-70 seconds. °· Contractions become very regular. °· Discomfort is usually felt in the top of the uterus, and it spreads to the lower abdomen and low back. °· Contractions do not go away with walking. °· Contractions usually become more intense and increase in frequency. °· The cervix dilates and gets thinner. °False labor °· Contractions are usually shorter and not as strong as true labor contractions. °· Contractions are usually irregular. °· Contractions  are often felt in the front of the lower abdomen and in the groin. °· Contractions may go away when you walk around or change positions while lying down. °· Contractions get weaker and are shorter-lasting as time goes on. °· The cervix usually does not dilate or become thin. °Follow these instructions at home: °· Take over-the-counter and prescription medicines only as told by your health care provider. °· Keep up with your usual exercises and follow other instructions from your health care provider. °· Eat and drink lightly if you think you are going into labor. °· If Braxton Hicks contractions are making you uncomfortable: °? Change your position from lying down or resting to walking, or change from walking to resting. °? Sit and rest in a tub of warm water. °? Drink enough fluid to keep your urine pale yellow. Dehydration may cause these contractions. °? Do slow and deep breathing several times an hour. °· Keep all follow-up prenatal visits as told by your health care provider. This is important. °Contact a health care provider if: °· You have a fever. °· You have continuous pain in your abdomen. °Get help right away if: °· Your contractions become stronger, more regular, and closer together. °· You have fluid leaking or gushing from your vagina. °· You pass blood-tinged mucus (bloody show). °· You have bleeding from your vagina. °· You have low back pain that you never had before. °· You feel your baby’s head pushing down and causing pelvic pressure. °· Your baby is not moving inside you as much as it used to. °Summary °· Contractions that occur before labor are called Braxton   Hicks contractions, false labor, or practice contractions. °· Braxton Hicks contractions are usually shorter, weaker, farther apart, and less regular than true labor contractions. True labor contractions usually become progressively stronger and regular and they become more frequent. °· Manage discomfort from Braxton Hicks contractions by  changing position, resting in a warm bath, drinking plenty of water, or practicing deep breathing. °This information is not intended to replace advice given to you by your health care provider. Make sure you discuss any questions you have with your health care provider. °Document Released: 04/27/2017 Document Revised: 04/27/2017 Document Reviewed: 04/27/2017 °Elsevier Interactive Patient Education © 2018 [employer] ° °

## 2018-10-27 NOTE — MAU Note (Signed)
Pt reports ctx since 6:30 pm about apart and getting stronger. history of PIH and preterm deliveries with most of her pregnancies. Denies any vag bleeding or leaking a this time

## 2018-10-27 NOTE — MAU Provider Note (Signed)
History     CSN: [id number]  Arrival date and time: 10/27/18 2053   First Provider Initiated Contact with Patient 10/27/18 2128      Chief Complaint  Patient presents with  . Contractions   HPI Paula Dominguez is a 37 y.o. [id number] at [redacted]w[redacted]d who presents with contractions since 630pm that are progressively getting worse. She rates the pain a 6/10 and has not tried anything for the pain. She denies leaking or bleeding. Reports good fetal movement. The pregnancy is complicated by chronic hypertension on medication. She denies any headache, visual changes or epigastric pain.  OB History    Gravida  8   Para  6   Term  6   Preterm  0   AB  1   Living  6     SAB  1   TAB      Ectopic      Multiple      Live Births  6           Past Medical History:  Diagnosis Date  . Anxiety and depression   . Hx of preeclampsia, prior pregnancy, currently pregnant    prior pregnancy  . Hypertension     Past Surgical History:  Procedure Laterality Date  . HEMORRHOID SURGERY      Family History  Problem Relation Age of Onset  . Healthy Mother   . Healthy Father   . Hypertension Other   . Cancer Other     Social History   Tobacco Use  . Smoking status: Former Smoker    Packs/day: 1.00    Years: 17.00    Pack years: 17.00    Types: Cigarettes    Last attempt to quit: 07/27/2013    Years since quitting: 5.2  . Smokeless tobacco: Never Used  Substance Use Topics  . Alcohol use: No  . Drug use: No    Allergies: No Known Allergies  Medications Prior to Admission  Medication Sig Dispense Refill Last Dose  . amLODipine (NORVASC) 5 MG tablet Take 1 tablet (5 mg total) by mouth daily. 30 tablet 11 Taking  . butalbital-acetaminophen-caffeine (FIORICET, ESGIC) 50-325-40 MG tablet Take 2 tablets by mouth every 8 (eight) hours as needed for headache. 30 tablet 2 Taking  . Elastic Bandages & Supports (COMFORT FIT MATERNITY SUPP SM) MISC Wear as directed. 1 each 0 Taking   . famotidine (PEPCID) 20 MG tablet Take 20 mg by mouth 2 (two) times daily.   Taking  . ferrous fumarate (HEMOCYTE - 106 MG FE) 325 (106 Fe) MG TABS tablet Take 1 tablet (106 mg of iron total) by mouth every morning. 30 each 11 Taking  . labetalol (NORMODYNE) 200 MG tablet Take 2 tablets (400 mg total) by mouth 2 (two) times daily. 120 tablet 4   . omeprazole (PRILOSEC) 20 MG capsule Take 1 capsule (20 mg total) by mouth daily as needed (GERD). 60 capsule 5 Taking  . ondansetron (ZOFRAN) 8 MG tablet Take 1 tablet (8 mg total) by mouth every 8 (eight) hours as needed for nausea. 30 tablet 0 Taking  . Prenat-Fe Poly-Methfol-FA-DHA (VITAFOL ULTRA) 29-0.6-0.4-200 MG CAPS Take 1 capsule by mouth daily before breakfast. (Patient not taking: Reported on 10/25/2018) 90 capsule 4 Not Taking  . promethazine (PHENERGAN) 25 MG tablet Take 1 tablet (25 mg total) by mouth every 6 (six) hours as needed for nausea or vomiting. 30 tablet 1 Taking  . ranitidine (ZANTAC) 150 MG tablet Take 150 mg by  mouth 2 (two) times daily.  5 Taking    Review of Systems  Constitutional: Negative.  Negative for fatigue and fever.  HENT: Negative.   Respiratory: Negative.  Negative for shortness of breath.   Cardiovascular: Negative.  Negative for chest pain.  Gastrointestinal: Positive for abdominal pain. Negative for constipation, diarrhea, nausea and vomiting.  Genitourinary: Negative.  Negative for dysuria, vaginal bleeding and vaginal discharge.  Neurological: Negative.  Negative for dizziness and headaches.   Physical Exam   Blood pressure 139/76, pulse 83, temperature 98.3 F (36.8 C), resp. rate 18, height 5\' 6"  (1.676 m), weight 98 kg.  Physical Exam  Nursing note and vitals reviewed. Constitutional: She is oriented to person, place, and time. She appears well-developed and well-nourished. No distress.  HENT:  Head: Normocephalic.  Eyes: Pupils are equal, round, and reactive to light.  Cardiovascular: Normal  rate, regular rhythm and normal heart sounds.  Respiratory: Effort normal and breath sounds normal. No respiratory distress.  GI: Soft. Bowel sounds are normal. She exhibits no distension. There is no tenderness.  Neurological: She is alert and oriented to person, place, and time.  Skin: Skin is warm and dry.  Psychiatric: She has a normal mood and affect. Her behavior is normal. Judgment and thought content normal.   Dilation: 1 Effacement (%): Thick Cervical Position: Posterior Station: -3 Presentation: Vertex Exam by:: C.[email or web address]  Fetal Tracing:  Baseline: 135 Variability: moderate Accels: 15x15 Decels: none  Toco: occasional uc's  MAU Course  Procedures  MDM Patient observed for 1.5 hours and no cervical change made No signs or symptoms of preterm labor at this time. BMZ given on 10/24 and 10/25  Assessment and Plan   1. Preterm uterine contractions in third trimester, antepartum   2. [redacted] weeks gestation of pregnancy    -Discharge home in stable condition -Preterm labor precautions discussed -Patient advised to follow-up with [facility] as scheduled for prenatal care -Patient may return to MAU as needed or if her condition were to change or worsen   Rolm Bookbinder 10/27/2018, 9:28 PM

## 2018-10-29 ENCOUNTER — Ambulatory Visit (HOSPITAL_COMMUNITY): Admission: RE | Admit: 2018-10-29 | Payer: BLUE CROSS/BLUE SHIELD | Source: Ambulatory Visit

## 2018-10-30 ENCOUNTER — Telehealth (HOSPITAL_COMMUNITY): Payer: Self-pay | Admitting: *Deleted

## 2018-10-30 NOTE — Telephone Encounter (Signed)
Preadmission screen  

## 2018-11-01 ENCOUNTER — Encounter: Payer: Self-pay | Admitting: Obstetrics

## 2018-11-01 ENCOUNTER — Encounter (HOSPITAL_COMMUNITY): Payer: Self-pay | Admitting: *Deleted

## 2018-11-01 ENCOUNTER — Other Ambulatory Visit: Payer: Self-pay

## 2018-11-01 ENCOUNTER — Ambulatory Visit (INDEPENDENT_AMBULATORY_CARE_PROVIDER_SITE_OTHER): Payer: BLUE CROSS/BLUE SHIELD | Admitting: Obstetrics

## 2018-11-01 ENCOUNTER — Inpatient Hospital Stay (HOSPITAL_COMMUNITY)
Admission: AD | Admit: 2018-11-01 | Discharge: 2018-11-01 | Disposition: A | Discharge status: Home / Self Care | Payer: BLUE CROSS/BLUE SHIELD | Source: Ambulatory Visit | Attending: Obstetrics and Gynecology | Admitting: Obstetrics and Gynecology

## 2018-11-01 VITALS — BP 147/97 | HR 85 | Wt 220.6 lb

## 2018-11-01 DIAGNOSIS — O09523 Supervision of elderly multigravida, third trimester: Secondary | ICD-10-CM

## 2018-11-01 DIAGNOSIS — O10013 Pre-existing essential hypertension complicating pregnancy, third trimester: Secondary | ICD-10-CM | POA: Diagnosis not present

## 2018-11-01 DIAGNOSIS — O99213 Obesity complicating pregnancy, third trimester: Secondary | ICD-10-CM

## 2018-11-01 DIAGNOSIS — O99210 Obesity complicating pregnancy, unspecified trimester: Secondary | ICD-10-CM

## 2018-11-01 DIAGNOSIS — O10913 Unspecified pre-existing hypertension complicating pregnancy, third trimester: Secondary | ICD-10-CM

## 2018-11-01 DIAGNOSIS — Z3689 Encounter for other specified antenatal screening: Secondary | ICD-10-CM

## 2018-11-01 DIAGNOSIS — Z3A35 35 weeks gestation of pregnancy: Secondary | ICD-10-CM | POA: Diagnosis not present

## 2018-11-01 DIAGNOSIS — Z87891 Personal history of nicotine dependence: Secondary | ICD-10-CM | POA: Diagnosis not present

## 2018-11-01 DIAGNOSIS — O10919 Unspecified pre-existing hypertension complicating pregnancy, unspecified trimester: Secondary | ICD-10-CM

## 2018-11-01 DIAGNOSIS — O09529 Supervision of elderly multigravida, unspecified trimester: Secondary | ICD-10-CM

## 2018-11-01 NOTE — MAU Note (Signed)
Pt sent from office for EFM secondary non reactive NST during office visit.

## 2018-11-01 NOTE — Discharge Instructions (Signed)
Braxton Hicks Contractions °Contractions of the uterus can occur throughout pregnancy, but they are not always a sign that you are in labor. You may have practice contractions called Braxton Hicks contractions. These false labor contractions are sometimes confused with true labor. °What are Braxton Hicks contractions? °Braxton Hicks contractions are tightening movements that occur in the muscles of the uterus before labor. Unlike true labor contractions, these contractions do not result in opening (dilation) and thinning of the cervix. Toward the end of pregnancy (32-34 weeks), Braxton Hicks contractions can happen more often and may become stronger. These contractions are sometimes difficult to tell apart from true labor because they can be very uncomfortable. You should not feel embarrassed if you go to the hospital with false labor. °Sometimes, the only way to tell if you are in true labor is for your health care provider to look for changes in the cervix. The health care provider will do a physical exam and may monitor your contractions. If you are not in true labor, the exam should show that your cervix is not dilating and your water has not broken. °If there are other health problems associated with your pregnancy, it is completely safe for you to be sent home with false labor. You may continue to have Braxton Hicks contractions until you go into true labor. °How to tell the difference between true labor and false labor °True labor °· Contractions last 30-70 seconds. °· Contractions become very regular. °· Discomfort is usually felt in the top of the uterus, and it spreads to the lower abdomen and low back. °· Contractions do not go away with walking. °· Contractions usually become more intense and increase in frequency. °· The cervix dilates and gets thinner. °False labor °· Contractions are usually shorter and not as strong as true labor contractions. °· Contractions are usually irregular. °· Contractions  are often felt in the front of the lower abdomen and in the groin. °· Contractions may go away when you walk around or change positions while lying down. °· Contractions get weaker and are shorter-lasting as time goes on. °· The cervix usually does not dilate or become thin. °Follow these instructions at home: °· Take over-the-counter and prescription medicines only as told by your health care provider. °· Keep up with your usual exercises and follow other instructions from your health care provider. °· Eat and drink lightly if you think you are going into labor. °· If Braxton Hicks contractions are making you uncomfortable: °? Change your position from lying down or resting to walking, or change from walking to resting. °? Sit and rest in a tub of warm water. °? Drink enough fluid to keep your urine pale yellow. Dehydration may cause these contractions. °? Do slow and deep breathing several times an hour. °· Keep all follow-up prenatal visits as told by your health care provider. This is important. °Contact a health care provider if: °· You have a fever. °· You have continuous pain in your abdomen. °Get help right away if: °· Your contractions become stronger, more regular, and closer together. °· You have fluid leaking or gushing from your vagina. °· You pass blood-tinged mucus (bloody show). °· You have bleeding from your vagina. °· You have low back pain that you never had before. °· You feel your baby’s head pushing down and causing pelvic pressure. °· Your baby is not moving inside you as much as it used to. °Summary °· Contractions that occur before labor are called Braxton   Hicks contractions, false labor, or practice contractions. °· Braxton Hicks contractions are usually shorter, weaker, farther apart, and less regular than true labor contractions. True labor contractions usually become progressively stronger and regular and they become more frequent. °· Manage discomfort from Braxton Hicks contractions by  changing position, resting in a warm bath, drinking plenty of water, or practicing deep breathing. °This information is not intended to replace advice given to you by your health care provider. Make sure you discuss any questions you have with your health care provider. °Document Released: 04/27/2017 Document Revised: 04/27/2017 Document Reviewed: 04/27/2017 °Elsevier Interactive Patient Education © 2018 [employer] ° °Fetal Movement Counts °Patient Name: ________________________________________________ Patient Due Date: ____________________ °What is a fetal movement count? °A fetal movement count is the number of times that you feel your baby move during a certain amount of time. This may also be called a fetal kick count. A fetal movement count is recommended for every pregnant woman. You may be asked to start counting fetal movements as early as week 28 of your pregnancy. °Pay attention to when your baby is most active. You may notice your baby's sleep and wake cycles. You may also notice things that make your baby move more. You should do a fetal movement count: °· When your baby is normally most active. °· At the same time each day. ° °A good time to count movements is while you are resting, after having something to eat and drink. °How do I count fetal movements? °1. Find a quiet, comfortable area. Sit, or lie down on your side. °2. Write down the date, the start time and stop time, and the number of movements that you felt between those two times. Take this information with you to your health care visits. °3. For 2 hours, count kicks, flutters, swishes, rolls, and jabs. You should feel at least 10 movements during 2 hours. °4. You may stop counting after you have felt 10 movements. °5. If you do not feel 10 movements in 2 hours, have something to eat and drink. Then, keep resting and counting for 1 hour. If you feel at least 4 movements during that hour, you may stop counting. °Contact a health care  provider if: °· You feel fewer than 4 movements in 2 hours. °· Your baby is not moving like he or she usually does. °Date: ____________ Start time: ____________ Stop time: ____________ Movements: ____________ °Date: ____________ Start time: ____________ Stop time: ____________ Movements: ____________ °Date: ____________ Start time: ____________ Stop time: ____________ Movements: ____________ °Date: ____________ Start time: ____________ Stop time: ____________ Movements: ____________ °Date: ____________ Start time: ____________ Stop time: ____________ Movements: ____________ °Date: ____________ Start time: ____________ Stop time: ____________ Movements: ____________ °Date: ____________ Start time: ____________ Stop time: ____________ Movements: ____________ °Date: ____________ Start time: ____________ Stop time: ____________ Movements: ____________ °Date: ____________ Start time: ____________ Stop time: ____________ Movements: ____________ °This information is not intended to replace advice given to you by your health care provider. Make sure you discuss any questions you have with your health care provider. °Document Released: 01/11/2007 Document Revised: 08/10/2016 Document Reviewed: 01/21/2016 °Elsevier Interactive Patient Education © 2018 [employer] ° °

## 2018-11-01 NOTE — Progress Notes (Signed)
Subjective:  Paula Dominguez is a 37 y.o. [id number] at [redacted]w[redacted]d being seen today for ongoing prenatal care.  She is currently monitored for the following issues for this high-risk pregnancy and has Hx of preeclampsia, prior pregnancy, currently pregnant; Post partum depression; Panic disorder with agoraphobia and moderate panic attacks; Supervision of normal pregnancy, antepartum; Chronic hypertension during pregnancy, antepartum; and Grand multiparity on their problem list.  Patient reports headache and blurred vision and nosebleeds.  Contractions: Irregular. Vag. Bleeding: None.  Movement: Present. Denies leaking of fluid.   The following portions of the patient's history were reviewed and updated as appropriate: allergies, current medications, past family history, past medical history, past social history, past surgical history and problem list. Problem list updated.  Objective:   Vitals:   11/01/18 0942  BP: (!) 147/97  Pulse: 85  Weight: 220 lb 9.6 oz (100.1 kg)    Fetal Status: Fetal Heart Rate (bpm): NST-NR   Movement: Present     General:  Alert, oriented and cooperative. Patient is in no acute distress.  Skin: Skin is warm and dry. No rash noted.   Cardiovascular: Normal heart rate noted  Respiratory: Normal respiratory effort, no problems with respiration noted  Abdomen: Soft, gravid, appropriate for gestational age. Pain/Pressure: Present     Pelvic:  Cervical exam deferred        Extremities: Normal range of motion.  Edema: Trace  Mental Status: Normal mood and affect. Normal behavior. Normal judgment and thought content.   Urinalysis:      Assessment and Plan:  Pregnancy: [id number] at [redacted]w[redacted]d  1. Supervision of elderly multigravida, antepartum  2. Chronic hypertension during pregnancy, antepartum Rx: - Fetal nonstress test:  Non Reactive  3. Obesity affecting pregnancy, antepartum  Patient sent to [facility] for further evaluation of non reactive NST Preterm labor symptoms and  general obstetric precautnon reactive NSTions including but not limited to vaginal bleeding, contractions, leaking of fluid and fetal movement were reviewed in detail with the patient. Please refer to After Visit Summary for other counseling recommendations.  Return in about 1 week (around 11/08/2018) for ROB.   Brock Bad, MD

## 2018-11-01 NOTE — MAU Provider Note (Signed)
History     CSN: [id number]  Arrival date and time: 11/01/18 1138   First Provider Initiated Contact with Patient 11/01/18 1216      Chief Complaint  Patient presents with  . Non Reactive NST   HPI Paula Dominguez is a 37 y.o. [id number] at [redacted]w[redacted]d who presents from the office for monitoring. She states she had a non reactive NST in the office and is being monitored for [facility]. She reports HA and visual changes that she has had throughout the pregnancy. She states the headache is not new and she has not tried anything for the pain. She denies leaking or bleeding. Reports normal fetal movement.   OB History    Gravida  8   Para  6   Term  6   Preterm  0   AB  1   Living  6     SAB  1   TAB      Ectopic      Multiple      Live Births  6           Past Medical History:  Diagnosis Date  . Anxiety and depression   . Hx of preeclampsia, prior pregnancy, currently pregnant    prior pregnancy  . Hypertension     Past Surgical History:  Procedure Laterality Date  . HEMORRHOID SURGERY      Family History  Problem Relation Age of Onset  . Healthy Mother   . Healthy Father   . Hypertension Other   . Cancer Other     Social History   Tobacco Use  . Smoking status: Former Smoker    Packs/day: 1.00    Years: 17.00    Pack years: 17.00    Types: Cigarettes    Last attempt to quit: 07/27/2013    Years since quitting: 5.2  . Smokeless tobacco: Never Used  Substance Use Topics  . Alcohol use: No  . Drug use: No    Allergies: No Known Allergies  Medications Prior to Admission  Medication Sig Dispense Refill Last Dose  . amLODipine (NORVASC) 5 MG tablet Take 1 tablet (5 mg total) by mouth daily. 30 tablet 11 Taking  . butalbital-acetaminophen-caffeine (FIORICET, ESGIC) 50-325-40 MG tablet Take 2 tablets by mouth every 8 (eight) hours as needed for headache. 30 tablet 2 Taking  . Elastic Bandages & Supports (COMFORT FIT MATERNITY SUPP SM) MISC Wear as  directed. 1 each 0 Taking  . famotidine (PEPCID) 20 MG tablet Take 20 mg by mouth 2 (two) times daily.   Taking  . ferrous fumarate (HEMOCYTE - 106 MG FE) 325 (106 Fe) MG TABS tablet Take 1 tablet (106 mg of iron total) by mouth every morning. 30 each 11 Taking  . labetalol (NORMODYNE) 200 MG tablet Take 2 tablets (400 mg total) by mouth 2 (two) times daily. 120 tablet 4 Taking  . omeprazole (PRILOSEC) 20 MG capsule Take 1 capsule (20 mg total) by mouth daily as needed (GERD). 60 capsule 5 Taking  . ondansetron (ZOFRAN) 8 MG tablet Take 1 tablet (8 mg total) by mouth every 8 (eight) hours as needed for nausea. 30 tablet 0 Taking  . Prenat-Fe Poly-Methfol-FA-DHA (VITAFOL ULTRA) 29-0.6-0.4-200 MG CAPS Take 1 capsule by mouth daily before breakfast. 90 capsule 4 Taking  . promethazine (PHENERGAN) 25 MG tablet Take 1 tablet (25 mg total) by mouth every 6 (six) hours as needed for nausea or vomiting. 30 tablet 1 Taking  . ranitidine (ZANTAC) 150  MG tablet Take 150 mg by mouth 2 (two) times daily.  5 Taking    Review of Systems  Constitutional: Negative.  Negative for fatigue and fever.  HENT: Negative.   Eyes: Positive for visual disturbance.  Respiratory: Negative.  Negative for shortness of breath.   Cardiovascular: Negative.  Negative for chest pain.  Gastrointestinal: Negative.  Negative for abdominal pain, constipation, diarrhea, nausea and vomiting.  Genitourinary: Negative.  Negative for dysuria, vaginal bleeding and vaginal discharge.  Neurological: Positive for headaches. Negative for dizziness.   Physical Exam   Blood pressure 123/80, pulse 85, temperature 98.7 F (37.1 C), temperature source Oral, resp. rate 20, height 5\' 6"  (1.676 m), weight 100.8 kg, SpO2 99 %.  Physical Exam  Nursing note and vitals reviewed. Constitutional: She is oriented to person, place, and time. She appears well-developed and well-nourished. No distress.  HENT:  Head: Normocephalic.  Eyes: Pupils are  equal, round, and reactive to light.  Cardiovascular: Normal rate, regular rhythm and normal heart sounds.  Respiratory: Effort normal and breath sounds normal. No respiratory distress.  GI: Soft. Bowel sounds are normal. She exhibits no distension. There is no tenderness.  Neurological: She is alert and oriented to person, place, and time.  Skin: Skin is warm and dry.  Psychiatric: She has a normal mood and affect. Her behavior is normal. Judgment and thought content normal.    Fetal Tracing:  Baseline: 125 Variability: moderate Accels: 15x15 Decels: none  Toco: occasional uc's  MAU Course  Procedures  MDM NST  Consulted with Dr. Erin Fulling regarding HA and chronic hypertension- ok to discharge home, HA is not new and patient reports constant HA entire pregnancy.   Assessment and Plan   1. NST (non-stress test) reactive   2. [redacted] weeks gestation of pregnancy   3. Chronic hypertension during pregnancy, antepartum    -Discharge home in stable condition -Fetal kick count precautions discussed -Patient advised to follow-up with [facility] as scheduled for prenatal care -Patient may return to MAU as needed or if her condition were to change or worsen  Rolm Bookbinder CNM 11/01/2018, 12:16 PM

## 2018-11-01 NOTE — Progress Notes (Signed)
Pt presents for ROB. Pt states that she is having blurred vision, headaches, swelling, and nose bleeds. She states that she is having nose bleeds daily.

## 2018-11-05 ENCOUNTER — Ambulatory Visit (HOSPITAL_COMMUNITY)
Admission: RE | Admit: 2018-11-05 | Discharge: 2018-11-05 | Disposition: A | Discharge status: Home / Self Care | Payer: BLUE CROSS/BLUE SHIELD | Source: Ambulatory Visit | Attending: Obstetrics | Admitting: Obstetrics

## 2018-11-05 ENCOUNTER — Encounter (HOSPITAL_COMMUNITY): Payer: Self-pay

## 2018-11-05 DIAGNOSIS — Z362 Encounter for other antenatal screening follow-up: Secondary | ICD-10-CM

## 2018-11-05 DIAGNOSIS — Z3A36 36 weeks gestation of pregnancy: Secondary | ICD-10-CM | POA: Diagnosis not present

## 2018-11-05 DIAGNOSIS — O10913 Unspecified pre-existing hypertension complicating pregnancy, third trimester: Secondary | ICD-10-CM | POA: Diagnosis not present

## 2018-11-05 DIAGNOSIS — O10919 Unspecified pre-existing hypertension complicating pregnancy, unspecified trimester: Secondary | ICD-10-CM

## 2018-11-05 DIAGNOSIS — O09523 Supervision of elderly multigravida, third trimester: Secondary | ICD-10-CM | POA: Diagnosis not present

## 2018-11-05 DIAGNOSIS — O10013 Pre-existing essential hypertension complicating pregnancy, third trimester: Secondary | ICD-10-CM | POA: Diagnosis not present

## 2018-11-08 ENCOUNTER — Encounter (HOSPITAL_COMMUNITY): Payer: Self-pay | Admitting: *Deleted

## 2018-11-08 ENCOUNTER — Other Ambulatory Visit: Payer: Self-pay

## 2018-11-08 ENCOUNTER — Inpatient Hospital Stay (HOSPITAL_BASED_OUTPATIENT_CLINIC_OR_DEPARTMENT_OTHER): Payer: BLUE CROSS/BLUE SHIELD

## 2018-11-08 ENCOUNTER — Encounter: Payer: Self-pay | Admitting: Obstetrics

## 2018-11-08 ENCOUNTER — Ambulatory Visit (INDEPENDENT_AMBULATORY_CARE_PROVIDER_SITE_OTHER): Payer: BLUE CROSS/BLUE SHIELD | Admitting: Obstetrics

## 2018-11-08 ENCOUNTER — Inpatient Hospital Stay (HOSPITAL_COMMUNITY)
Admission: AD | Admit: 2018-11-08 | Discharge: 2018-11-08 | Disposition: A | Discharge status: Home / Self Care | Payer: BLUE CROSS/BLUE SHIELD | Source: Ambulatory Visit | Attending: Obstetrics & Gynecology | Admitting: Obstetrics & Gynecology

## 2018-11-08 VITALS — BP 136/92 | HR 85 | Wt 217.5 lb

## 2018-11-08 DIAGNOSIS — O288 Other abnormal findings on antenatal screening of mother: Secondary | ICD-10-CM | POA: Diagnosis not present

## 2018-11-08 DIAGNOSIS — O99213 Obesity complicating pregnancy, third trimester: Secondary | ICD-10-CM

## 2018-11-08 DIAGNOSIS — O99013 Anemia complicating pregnancy, third trimester: Secondary | ICD-10-CM

## 2018-11-08 DIAGNOSIS — O289 Unspecified abnormal findings on antenatal screening of mother: Secondary | ICD-10-CM | POA: Insufficient documentation

## 2018-11-08 DIAGNOSIS — O10013 Pre-existing essential hypertension complicating pregnancy, third trimester: Secondary | ICD-10-CM | POA: Insufficient documentation

## 2018-11-08 DIAGNOSIS — O0990 Supervision of high risk pregnancy, unspecified, unspecified trimester: Secondary | ICD-10-CM

## 2018-11-08 DIAGNOSIS — Z3A36 36 weeks gestation of pregnancy: Secondary | ICD-10-CM

## 2018-11-08 DIAGNOSIS — Z113 Encounter for screening for infections with a predominantly sexual mode of transmission: Secondary | ICD-10-CM

## 2018-11-08 DIAGNOSIS — O10913 Unspecified pre-existing hypertension complicating pregnancy, third trimester: Secondary | ICD-10-CM

## 2018-11-08 DIAGNOSIS — O09523 Supervision of elderly multigravida, third trimester: Secondary | ICD-10-CM

## 2018-11-08 DIAGNOSIS — O09529 Supervision of elderly multigravida, unspecified trimester: Secondary | ICD-10-CM | POA: Diagnosis not present

## 2018-11-08 DIAGNOSIS — O09293 Supervision of pregnancy with other poor reproductive or obstetric history, third trimester: Secondary | ICD-10-CM

## 2018-11-08 DIAGNOSIS — O10919 Unspecified pre-existing hypertension complicating pregnancy, unspecified trimester: Secondary | ICD-10-CM

## 2018-11-08 DIAGNOSIS — N898 Other specified noninflammatory disorders of vagina: Secondary | ICD-10-CM

## 2018-11-08 DIAGNOSIS — O99210 Obesity complicating pregnancy, unspecified trimester: Secondary | ICD-10-CM

## 2018-11-08 DIAGNOSIS — O99019 Anemia complicating pregnancy, unspecified trimester: Secondary | ICD-10-CM

## 2018-11-08 LAB — URINALYSIS, ROUTINE W REFLEX MICROSCOPIC
Bilirubin Urine: NEGATIVE
Glucose, UA: NEGATIVE mg/dL
HGB URINE DIPSTICK: NEGATIVE
Ketones, ur: 5 mg/dL — AB
Leukocytes, UA: NEGATIVE
Nitrite: NEGATIVE
Protein, ur: NEGATIVE mg/dL
SPECIFIC GRAVITY, URINE: 1.019 (ref 1.005–1.030)
pH: 6 (ref 5.0–8.0)

## 2018-11-08 LAB — PROTEIN / CREATININE RATIO, URINE
CREATININE, URINE: 247 mg/dL
Protein Creatinine Ratio: 0.07 mg/mg{Cre} (ref 0.00–0.15)
TOTAL PROTEIN, URINE: 18 mg/dL

## 2018-11-08 NOTE — Discharge Instructions (Signed)

## 2018-11-08 NOTE — Progress Notes (Signed)
Subjective:  Paula Dominguez is a 37 y.o. [id number] at 6534w5d being seen today for ongoing prenatal care.  She is currently monitored for the following issues for this high-risk pregnancy and has Hx of preeclampsia, prior pregnancy, currently pregnant; Post partum depression; Panic disorder with agoraphobia and moderate panic attacks; Supervision of normal pregnancy, antepartum; Chronic hypertension during pregnancy, antepartum; and Grand multiparity on their problem list.  Patient reports contractions since this am.  Contractions: Irregular. Vag. Bleeding: None.  Movement: Present. Denies leaking of fluid.   The following portions of the patient's history were reviewed and updated as appropriate: allergies, current medications, past family history, past medical history, past social history, past surgical history and problem list. Problem list updated.  Objective:   Vitals:   11/08/18 0950  BP: (!) 136/92  Pulse: 85  Weight: 217 lb 8 oz (98.7 kg)    Fetal Status: Fetal Heart Rate (bpm): NST   Movement: Present     General:  Alert, oriented and cooperative. Patient is in no acute distress.  Skin: Skin is warm and dry. No rash noted.   Cardiovascular: Normal heart rate noted  Respiratory: Normal respiratory effort, no problems with respiration noted  Abdomen: Soft, gravid, appropriate for gestational age. Pain/Pressure: Present     Pelvic:  Cervical exam deferred        Extremities: Normal range of motion.  Edema: Trace  Mental Status: Normal mood and affect. Normal behavior. Normal judgment and thought content.   Urinalysis:      NST:  Nonreactive.  Good variability.  Uterine contractions every 3-5 minutes.  ? Mild variables but strip discontinuous.  Assessment and Plan:  Pregnancy: [id number] at 2134w5d.  GBS and  GC / CT Cultures done today.  1. Supervision of elderly multigravida, antepartum  2. Chronic hypertension during pregnancy, antepartum - stable - IOL scheduled for  11-10-18  3. Obesity affecting pregnancy, antepartum  4. Anemia affecting pregnancy, antepartum   Preterm labor symptoms and general obstetric precautions including but not limited to vaginal bleeding, contractions, leaking of fluid and fetal movement were reviewed in detail with the patient. Please refer to After Visit Summary for other counseling recommendations.  Follow up postpartum    Brock BadHarper, Kyndall Amero A, MD

## 2018-11-08 NOTE — Progress Notes (Signed)
ROB/GBS and NST.  C/o nosebleeds, headaches 8/10 x months, pain, pressure and corporal tunnel.

## 2018-11-08 NOTE — MAU Provider Note (Signed)
Chief Complaint:  Non Reactive NST   First Provider Initiated Contact with Patient 11/08/18 1222     HPI: Paula Croftsakiya Sabater is a 37 y.o. [id number] at 6073w5d who presents to maternity admissions from the office for fetal monitoring. Was in office today for her routine OB appt & had non reactive NST.  Denies abdominal pain, LOF, or vaginal bleeding. Positive fetal movement.    Past Medical History:  Diagnosis Date  . Anxiety and depression   . Hx of preeclampsia, prior pregnancy, currently pregnant    prior pregnancy  . Hypertension    OB History  Gravida Para Term Preterm AB Living  8 6 6  0 1 6  SAB TAB Ectopic Multiple Live Births  1       6    # Outcome Date GA Lbr Len/2nd Weight Sex Delivery Anes PTL Lv  8 Current           7 Term 11/20/13 7124w6d 06:10 / 00:12 2920 g M Vag-Spont EPI, Local  LIV  6 Term 12/23/10 4442w0d  2722 g M Vag-Spont EPI  LIV     Birth Comments: preeclampsia  5 Term 03/29/06 4742w0d  3175 g F Vag-Spont   LIV  4 SAB 2006 2432w0d         3 Term 10/22/02 8242w0d  3175 g M Vag-Spont   LIV  2 Term 08/10/98 7129w0d  3175 g F Vag-Spont EPI  LIV  1 Term 03/23/95 3742w0d  3175 g M Vag-Spont EPI  LIV   Past Surgical History:  Procedure Laterality Date  . HEMORRHOID SURGERY     Family History  Problem Relation Age of Onset  . Healthy Mother   . Healthy Father   . Hypertension Other   . Cancer Other    Social History   Tobacco Use  . Smoking status: Former Smoker    Packs/day: 1.00    Years: 17.00    Pack years: 17.00    Types: Cigarettes    Last attempt to quit: 07/27/2013    Years since quitting: 5.2  . Smokeless tobacco: Never Used  Substance Use Topics  . Alcohol use: No  . Drug use: No   No Known Allergies No medications prior to admission.    I have reviewed patient's Past Medical Hx, Surgical Hx, Family Hx, Social Hx, medications and allergies.   ROS:  Review of Systems  Constitutional: Negative.   Gastrointestinal: Negative.   Genitourinary:  Negative.     Physical Exam   Patient Vitals for the past 24 hrs:  BP Temp Temp src Pulse Resp SpO2 Height Weight  11/08/18 1321 128/73 98.1 F (36.7 C) Oral 91 18 100 % - -  11/08/18 1151 - - - - - 99 % - -  11/08/18 1150 134/78 98.1 F (36.7 C) Oral 90 20 - - -  11/08/18 1140 - - - - - - 5\' 6"  (1.676 m) 80.2 kg    Constitutional: Well-developed, well-nourished female in no acute distress.  Cardiovascular: normal rate & rhythm, no murmur Respiratory: normal effort, lung sounds clear throughout GI: Abd soft, non-tender, gravid appropriate for gestational age. Pos BS x 4 MS: Extremities nontender, no edema, normal ROM Neurologic: Alert and oriented x 4.   NST:  Baseline: 135 bpm, Variability: Good {> 6 bpm), Accelerations: Non-reactive but appropriate for gestational age and Decelerations: Absent   Labs: Results for orders placed or performed during the hospital encounter of 11/08/18 (from the past 24 hour(s))  Urinalysis,  Routine w reflex microscopic     Status: Abnormal   Collection Time: 11/08/18 11:42 AM  Result Value Ref Range   Color, Urine YELLOW YELLOW   APPearance CLEAR CLEAR   Specific Gravity, Urine 1.019 1.005 - 1.030   pH 6.0 5.0 - 8.0   Glucose, UA NEGATIVE NEGATIVE mg/dL   Hgb urine dipstick NEGATIVE NEGATIVE   Bilirubin Urine NEGATIVE NEGATIVE   Ketones, ur 5 (A) NEGATIVE mg/dL   Protein, ur NEGATIVE NEGATIVE mg/dL   Nitrite NEGATIVE NEGATIVE   Leukocytes, UA NEGATIVE NEGATIVE  Protein / creatinine ratio, urine     Status: None   Collection Time: 11/08/18 11:42 AM  Result Value Ref Range   Creatinine, Urine 247.00 mg/dL   Total Protein, Urine 18 mg/dL   Protein Creatinine Ratio 0.07 0.00 - 0.15 mg/mg[Cre]    Imaging:  [place] Mfm Fetal Bpp Wo Non Stress  Result Date: 11/08/2018 ----------------------------------------------------------------------  OBSTETRICS REPORT                       (Signed Final 11/08/2018 02:12 pm)  ---------------------------------------------------------------------- Patient Info  ID #:       [id number]                          D.O.B.:  March 06, 1981 (37 yrs)  Name:       Paula Dominguez                 Visit Date: 11/08/2018 12:39 pm ---------------------------------------------------------------------- Performed By  Performed By:     Lenise Arena        Ref. Address:     [address]                                                             [address]                                                             [place]                                                             [address]  Attending:        Patsi Sears      Location:         [facility]  MD  Referred By:      Center for                    [facility]                    [facility] - [facility] ---------------------------------------------------------------------- Orders   #  Description                          Code         Ordered By   1  [place] MFM FETAL BPP WO NON              16109.60     Judeth Horn      STRESS  ----------------------------------------------------------------------   #  Order #                    Accession #                 Episode #   1  [id number]                  [id number]                  [id number]  ---------------------------------------------------------------------- Indications   Non-reactive NST                               O28.9   Hypertension - Chronic/Pre-existing            O10.019   (Norvasc)   Advanced maternal age multigravida 28+,        O71.523   third trimester (low risk Panorama)   [redacted] weeks gestation of pregnancy                Z3A.36   Poor obstetric history: Previous preeclampsia  O09.299  ---------------------------------------------------------------------- Vital Signs  Weight (lb): 176                               Height:        5'6"  BMI:         28.4  ---------------------------------------------------------------------- Fetal Evaluation  Num Of Fetuses:         1  Fetal Heart Rate(bpm):  140  Cardiac Activity:       Observed  Presentation:           Cephalic  Placenta:               Posterior  Amniotic Fluid  AFI FV:      Within normal limits  AFI Sum(cm)     %Tile       Largest Pocket(cm)  17.44           66          7.46  RUQ(cm)       RLQ(cm)       LUQ(cm)        LLQ(cm)  4.94          7.46          2.17           2.87  Comment:    NST from office reported as non-reactive which is reflected              below. ---------------------------------------------------------------------- Biophysical Evaluation  Amniotic F.V:   Within  normal limits       F. Tone:        Observed  F. Movement:    Observed                   N.S.T:          Nonreactive  F. Breathing:   Observed                   Score:          8/10 ---------------------------------------------------------------------- Gestational Age  Best:          36w 5d     Det. By:  U/S  (06/15/18)          EDD:   12/01/18 ---------------------------------------------------------------------- Anatomy  Thoracic:              Appears normal         Abdomen:                Appears normal  Diaphragm:             Appears normal         Kidneys:                Appear normal  Stomach:               Appears normal, left   Bladder:                Appears normal                         sided ---------------------------------------------------------------------- Cervix Uterus Adnexa  Cervix  Not visualized (advanced GA >24wks) ---------------------------------------------------------------------- Comments  U/S images reviewed.  No evidence of fetal compromise is  found on BPP today.  No fetal abnormalities are seen.  Recommendations: 1) Serial U/S every 4 weeks for fetal  growth 2) Weekly BPP beginning @ 36 weeks ---------------------------------------------------------------------- Recommendations   1) Serial U/S every 4  weeks for fetal growth 2) Weekly BPP ----------------------------------------------------------------------               Patsi Sears, MD Electronically Signed Final Report   11/08/2018 02:12 pm ----------------------------------------------------------------------   MAU Course: Orders Placed This Encounter  Procedures  . [place] MFM FETAL BPP WO NON STRESS  . Urinalysis, Routine w reflex microscopic  . Protein / creatinine ratio, urine  . Discharge patient   No orders of the defined types were placed in this encounter.   MDM: Fetal tracing reassuring but not reactive. BPP 8/8 with normal AFI. Pt reports normal movement.   Assessment: 1. Non-reactive NST (non-stress test)   2. [redacted] weeks gestation of pregnancy     Plan: Discharge home in stable condition.  Discussed reasons to return to MAU. Keep schedule IOL on Saturday.    Allergies as of 11/08/2018   No Known Allergies     Medication List    TAKE these medications   amLODipine 5 MG tablet Commonly known as:  NORVASC Take 1 tablet (5 mg total) by mouth daily.   butalbital-acetaminophen-caffeine 50-325-40 MG tablet Commonly known as:  FIORICET, ESGIC Take 2 tablets by mouth every 8 (eight) hours as needed for headache.   COMFORT FIT MATERNITY SUPP SM Misc Wear as directed.   famotidine 20 MG tablet Commonly known as:  PEPCID Take 20 mg by mouth 2 (two) times daily.   ferrous fumarate 325 (106 Fe) MG Tabs tablet Commonly known as:  HEMOCYTE - 106 mg FE Take 1 tablet (106 mg of iron total) by mouth every morning.   labetalol 200 MG tablet Commonly known as:  NORMODYNE Take 2 tablets (400 mg total) by mouth 2 (two) times daily.   omeprazole 20 MG capsule Commonly known as:  PRILOSEC Take 1 capsule (20 mg total) by mouth daily as needed (GERD).   ondansetron 8 MG tablet Commonly known as:  ZOFRAN Take 1 tablet (8 mg total) by mouth every 8 (eight) hours as needed for nausea.   promethazine 25 MG  tablet Commonly known as:  PHENERGAN Take 1 tablet (25 mg total) by mouth every 6 (six) hours as needed for nausea or vomiting.   ranitidine 150 MG tablet Commonly known as:  ZANTAC Take 150 mg by mouth 2 (two) times daily.   VITAFOL ULTRA 29-0.6-0.4-200 MG Caps Take 1 capsule by mouth daily before breakfast.       Judeth Horn, NP 11/08/2018 8:27 PM

## 2018-11-08 NOTE — MAU Note (Signed)
Pt sent from office secondary Non Reactive NST.  Scheduled IOL Saturday, 11/10/2018

## 2018-11-09 LAB — CERVICOVAGINAL ANCILLARY ONLY
BACTERIAL VAGINITIS: NEGATIVE
CANDIDA VAGINITIS: NEGATIVE
CHLAMYDIA, DNA PROBE: NEGATIVE
Neisseria Gonorrhea: NEGATIVE
Trichomonas: NEGATIVE

## 2018-11-10 ENCOUNTER — Inpatient Hospital Stay (HOSPITAL_COMMUNITY): Payer: BLUE CROSS/BLUE SHIELD | Admitting: Anesthesiology

## 2018-11-10 ENCOUNTER — Other Ambulatory Visit: Payer: Self-pay

## 2018-11-10 ENCOUNTER — Encounter (HOSPITAL_COMMUNITY): Payer: Self-pay

## 2018-11-10 ENCOUNTER — Inpatient Hospital Stay (HOSPITAL_COMMUNITY)
Admission: RE | Admit: 2018-11-10 | Discharge: 2018-11-12 | DRG: 807 | Disposition: A | Discharge status: Home / Self Care | Payer: BLUE CROSS/BLUE SHIELD | Attending: Obstetrics & Gynecology | Admitting: Obstetrics & Gynecology

## 2018-11-10 DIAGNOSIS — O09529 Supervision of elderly multigravida, unspecified trimester: Secondary | ICD-10-CM

## 2018-11-10 DIAGNOSIS — Z23 Encounter for immunization: Secondary | ICD-10-CM | POA: Diagnosis not present

## 2018-11-10 DIAGNOSIS — Z3490 Encounter for supervision of normal pregnancy, unspecified, unspecified trimester: Secondary | ICD-10-CM | POA: Diagnosis present

## 2018-11-10 DIAGNOSIS — O9902 Anemia complicating childbirth: Secondary | ICD-10-CM | POA: Diagnosis present

## 2018-11-10 DIAGNOSIS — Z87891 Personal history of nicotine dependence: Secondary | ICD-10-CM | POA: Diagnosis not present

## 2018-11-10 DIAGNOSIS — O99214 Obesity complicating childbirth: Secondary | ICD-10-CM | POA: Diagnosis not present

## 2018-11-10 DIAGNOSIS — F4001 Agoraphobia with panic disorder: Secondary | ICD-10-CM | POA: Diagnosis present

## 2018-11-10 DIAGNOSIS — O10919 Unspecified pre-existing hypertension complicating pregnancy, unspecified trimester: Secondary | ICD-10-CM | POA: Diagnosis present

## 2018-11-10 DIAGNOSIS — Z3A37 37 weeks gestation of pregnancy: Secondary | ICD-10-CM | POA: Diagnosis not present

## 2018-11-10 DIAGNOSIS — O99824 Streptococcus B carrier state complicating childbirth: Secondary | ICD-10-CM | POA: Diagnosis not present

## 2018-11-10 DIAGNOSIS — O1002 Pre-existing essential hypertension complicating childbirth: Principal | ICD-10-CM | POA: Diagnosis present

## 2018-11-10 DIAGNOSIS — D509 Iron deficiency anemia, unspecified: Secondary | ICD-10-CM | POA: Diagnosis not present

## 2018-11-10 DIAGNOSIS — O99344 Other mental disorders complicating childbirth: Secondary | ICD-10-CM | POA: Diagnosis present

## 2018-11-10 DIAGNOSIS — O134 Gestational [pregnancy-induced] hypertension without significant proteinuria, complicating childbirth: Secondary | ICD-10-CM | POA: Diagnosis not present

## 2018-11-10 LAB — CBC
HCT: 32 % — ABNORMAL LOW (ref 36.0–46.0)
Hemoglobin: 10.5 g/dL — ABNORMAL LOW (ref 12.0–15.0)
MCH: 31.5 pg (ref 26.0–34.0)
MCHC: 32.8 g/dL (ref 30.0–36.0)
MCV: 96.1 fL (ref 80.0–100.0)
PLATELETS: 244 10*3/uL (ref 150–400)
RBC: 3.33 MIL/uL — AB (ref 3.87–5.11)
RDW: 13.5 % (ref 11.5–15.5)
WBC: 9.8 10*3/uL (ref 4.0–10.5)
nRBC: 0 % (ref 0.0–0.2)

## 2018-11-10 LAB — PREPARE RBC (CROSSMATCH)

## 2018-11-10 LAB — STREP GP B NAA: Strep Gp B NAA: POSITIVE — AB

## 2018-11-10 MED ORDER — OXYTOCIN 40 UNITS IN LACTATED RINGERS INFUSION - SIMPLE MED
1.0000 m[IU]/min | INTRAVENOUS | Status: DC
  Administered 2018-11-10: 2 m[IU]/min via INTRAVENOUS

## 2018-11-10 MED ORDER — PHENYLEPHRINE 40 MCG/ML (10ML) SYRINGE FOR IV PUSH (FOR BLOOD PRESSURE SUPPORT)
PREFILLED_SYRINGE | INTRAVENOUS | Status: AC
  Filled 2018-11-10: qty 20

## 2018-11-10 MED ORDER — SODIUM CHLORIDE 0.9 % IV SOLN
5.0000 10*6.[IU] | Freq: Once | INTRAVENOUS | Status: AC
  Administered 2018-11-10: 5 10*6.[IU] via INTRAVENOUS
  Filled 2018-11-10: qty 5

## 2018-11-10 MED ORDER — PHENYLEPHRINE 40 MCG/ML (10ML) SYRINGE FOR IV PUSH (FOR BLOOD PRESSURE SUPPORT)
80.0000 ug | PREFILLED_SYRINGE | INTRAVENOUS | Status: DC | PRN
  Filled 2018-11-10: qty 5

## 2018-11-10 MED ORDER — LACTATED RINGERS IV SOLN
INTRAVENOUS | Status: DC
  Administered 2018-11-10 (×2): via INTRAVENOUS

## 2018-11-10 MED ORDER — AMLODIPINE BESYLATE 5 MG PO TABS
5.0000 mg | ORAL_TABLET | Freq: Every day | ORAL | Status: DC
  Filled 2018-11-10: qty 1

## 2018-11-10 MED ORDER — PENICILLIN G 3 MILLION UNITS IVPB - SIMPLE MED
3.0000 10*6.[IU] | INTRAVENOUS | Status: DC
  Administered 2018-11-10 (×2): 3 10*6.[IU] via INTRAVENOUS
  Filled 2018-11-10 (×6): qty 100

## 2018-11-10 MED ORDER — LIDOCAINE HCL (PF) 1 % IJ SOLN
30.0000 mL | INTRAMUSCULAR | Status: DC | PRN
  Filled 2018-11-10: qty 30

## 2018-11-10 MED ORDER — OXYCODONE-ACETAMINOPHEN 5-325 MG PO TABS: 2.0000 | ORAL_TABLET | ORAL | Status: DC | PRN

## 2018-11-10 MED ORDER — OXYTOCIN BOLUS FROM INFUSION
500.0000 mL | Freq: Once | INTRAVENOUS | Status: AC
  Administered 2018-11-10: 500 mL via INTRAVENOUS

## 2018-11-10 MED ORDER — LACTATED RINGERS IV SOLN: 500.0000 mL | INTRAVENOUS | Status: DC | PRN

## 2018-11-10 MED ORDER — DIPHENOXYLATE-ATROPINE 2.5-0.025 MG PO TABS: 2.0000 | ORAL_TABLET | Freq: Four times a day (QID) | ORAL | Status: DC | PRN

## 2018-11-10 MED ORDER — FENTANYL 2.5 MCG/ML BUPIVACAINE 1/10 % EPIDURAL INFUSION (WH - ANES)
14.0000 mL/h | INTRAMUSCULAR | Status: DC | PRN
  Administered 2018-11-10 (×2): 14 mL/h via EPIDURAL
  Filled 2018-11-10: qty 100

## 2018-11-10 MED ORDER — NICOTINE 14 MG/24HR TD PT24
14.0000 mg | MEDICATED_PATCH | Freq: Every day | TRANSDERMAL | Status: DC
  Administered 2018-11-10: 14 mg via TRANSDERMAL
  Filled 2018-11-10 (×2): qty 1

## 2018-11-10 MED ORDER — OXYTOCIN 40 UNITS IN LACTATED RINGERS INFUSION - SIMPLE MED
2.5000 [IU]/h | INTRAVENOUS | Status: DC
  Administered 2018-11-10: 2.5 [IU]/h via INTRAVENOUS
  Filled 2018-11-10: qty 1000

## 2018-11-10 MED ORDER — MISOPROSTOL 200 MCG PO TABS
800.0000 ug | ORAL_TABLET | Freq: Once | ORAL | Status: AC
  Administered 2018-11-10: 800 ug via RECTAL
  Filled 2018-11-10: qty 4

## 2018-11-10 MED ORDER — SOD CITRATE-CITRIC ACID 500-334 MG/5ML PO SOLN
30.0000 mL | ORAL | Status: DC | PRN
  Administered 2018-11-10: 30 mL via ORAL
  Filled 2018-11-10: qty 15

## 2018-11-10 MED ORDER — SODIUM CHLORIDE 0.9% IV SOLUTION: Freq: Once | INTRAVENOUS | Status: DC

## 2018-11-10 MED ORDER — LIDOCAINE HCL (PF) 1 % IJ SOLN
INTRAMUSCULAR | Status: DC | PRN
  Administered 2018-11-10: 8 mL via EPIDURAL

## 2018-11-10 MED ORDER — FENTANYL 2.5 MCG/ML BUPIVACAINE 1/10 % EPIDURAL INFUSION (WH - ANES)
INTRAMUSCULAR | Status: AC
  Filled 2018-11-10: qty 100

## 2018-11-10 MED ORDER — DIPHENHYDRAMINE HCL 50 MG/ML IJ SOLN: 12.5000 mg | INTRAMUSCULAR | Status: DC | PRN

## 2018-11-10 MED ORDER — OXYCODONE-ACETAMINOPHEN 5-325 MG PO TABS: 1.0000 | ORAL_TABLET | ORAL | Status: DC | PRN

## 2018-11-10 MED ORDER — MISOPROSTOL 50MCG HALF TABLET
50.0000 ug | ORAL_TABLET | ORAL | Status: DC
  Administered 2018-11-10: 50 ug via BUCCAL
  Filled 2018-11-10 (×5): qty 1

## 2018-11-10 MED ORDER — LACTATED RINGERS IV SOLN: 500.0000 mL | Freq: Once | INTRAVENOUS | Status: DC

## 2018-11-10 MED ORDER — IBUPROFEN 800 MG PO TABS
800.0000 mg | ORAL_TABLET | Freq: Once | ORAL | Status: AC
  Administered 2018-11-10: 800 mg via ORAL
  Filled 2018-11-10: qty 1

## 2018-11-10 MED ORDER — EPHEDRINE 5 MG/ML INJ
10.0000 mg | INTRAVENOUS | Status: DC | PRN
  Filled 2018-11-10: qty 2

## 2018-11-10 MED ORDER — CARBOPROST TROMETHAMINE 250 MCG/ML IM SOLN
250.0000 ug | Freq: Once | INTRAMUSCULAR | Status: DC
  Filled 2018-11-10: qty 1

## 2018-11-10 MED ORDER — TRANEXAMIC ACID-NACL 1000-0.7 MG/100ML-% IV SOLN
1000.0000 mg | INTRAVENOUS | Status: DC
  Filled 2018-11-10: qty 100

## 2018-11-10 MED ORDER — LABETALOL HCL 200 MG PO TABS
200.0000 mg | ORAL_TABLET | Freq: Three times a day (TID) | ORAL | Status: DC
  Administered 2018-11-10 (×2): 200 mg via ORAL
  Filled 2018-11-10 (×2): qty 1

## 2018-11-10 MED ORDER — ACETAMINOPHEN 325 MG PO TABS
650.0000 mg | ORAL_TABLET | ORAL | Status: DC | PRN
  Administered 2018-11-10: 650 mg via ORAL
  Filled 2018-11-10: qty 2

## 2018-11-10 MED ORDER — MISOPROSTOL 200 MCG PO TABS: 800.0000 ug | ORAL_TABLET | Freq: Once | ORAL | Status: DC

## 2018-11-10 MED ORDER — TERBUTALINE SULFATE 1 MG/ML IJ SOLN
0.2500 mg | Freq: Once | INTRAMUSCULAR | Status: DC | PRN
  Filled 2018-11-10: qty 1

## 2018-11-10 MED ORDER — ONDANSETRON HCL 4 MG/2ML IJ SOLN
4.0000 mg | Freq: Four times a day (QID) | INTRAMUSCULAR | Status: DC | PRN
  Administered 2018-11-10: 4 mg via INTRAVENOUS
  Filled 2018-11-10: qty 2

## 2018-11-10 NOTE — Anesthesia Postprocedure Evaluation (Signed)
Anesthesia Post Note  Patient: Paula Dominguez  Procedure(s) Performed: AN AD HOC LABOR EPIDURAL     Patient location during evaluation: Mother Baby Anesthesia Type: Epidural Level of consciousness: awake and alert Pain management: pain level controlled Vital Signs Assessment: post-procedure vital signs reviewed and stable Respiratory status: spontaneous breathing, nonlabored ventilation and respiratory function stable Cardiovascular status: stable Postop Assessment: no headache, no backache and epidural receding Anesthetic complications: no    Last Vitals:  Vitals:   11/10/18 2231 11/10/18 2246  BP: (!) 159/99 (!) 158/98  Pulse: 78 77  Resp: 17   Temp: 36.9 C     Last Pain:  Vitals:   11/10/18 2242  TempSrc:   PainSc: 3    Pain Goal: Patients Stated Pain Goal: 0 (11/10/18 2242)               Shyteria Lewis

## 2018-11-10 NOTE — Anesthesia Procedure Notes (Signed)
Epidural Patient location during procedure: OB Start time: 11/10/2018 11:50 AM End time: 11/10/2018 11:56 AM  Staffing Anesthesiologist: Bethena Midgetddono, Jermanie Minshall, MD  Preanesthetic Checklist Completed: patient identified, site marked, surgical consent, pre-op evaluation, timeout performed, IV checked, risks and benefits discussed and monitors and equipment checked  Epidural Patient position: sitting Prep: site prepped and draped and DuraPrep Patient monitoring: continuous pulse ox and blood pressure Approach: midline Location: L3-L4 Injection technique: LOR air  Needle:  Needle type: Tuohy  Needle gauge: 17 G Needle length: 9 cm and 9 Needle insertion depth: 6 cm Catheter type: closed end flexible Catheter size: 19 Gauge Catheter at skin depth: 11 cm Test dose: negative  Assessment Events: blood not aspirated, injection not painful, no injection resistance, negative IV test and no paresthesia

## 2018-11-10 NOTE — H&P (Signed)
LABOR AND DELIVERY ADMISSION HISTORY AND PHYSICAL NOTE  Paula Dominguez is a 37 y.o. female [phone] with IUP at [redacted]w[redacted]d by 15w [place] presenting for IOL for poorly controlled cHTN. UPC 0.18 with normal AST/ALT/Platelets.  She reports positive fetal movement. She denies leakage of fluid or vaginal bleeding.  Prenatal History/Complications: PNC at [facility] Pregnancy complications:  - cHTN  Past Medical History: Past Medical History:  Diagnosis Date  . Anxiety and depression   . Hx of preeclampsia, prior pregnancy, currently pregnant    prior pregnancy  . Hypertension     Past Surgical History: Past Surgical History:  Procedure Laterality Date  . HEMORRHOID SURGERY      Obstetrical History: OB History    Gravida  8   Para  6   Term  6   Preterm  0   AB  1   Living  6     SAB  1   TAB      Ectopic      Multiple      Live Births  6           Social History: Social History   Socioeconomic History  . Marital status: Married    Spouse name: Janiyha Montufar  . Number of children: 5  . Years of education: Not on file  . Highest education level: Not on file  Occupational History  . Occupation: CERTIFIED NURSING ASSISTANT    Employer: [employer]  Social Needs  . Financial resource strain: Not on file  . Food insecurity:    Worry: Not on file    Inability: Not on file  . Transportation needs:    Medical: Not on file    Non-medical: Not on file  Tobacco Use  . Smoking status: Former Smoker    Packs/day: 1.00    Years: 17.00    Pack years: 17.00    Types: Cigarettes    Last attempt to quit: 07/27/2013    Years since quitting: 5.2  . Smokeless tobacco: Never Used  Substance and Sexual Activity  . Alcohol use: No  . Drug use: No  . Sexual activity: Yes    Partners: Male    Birth control/protection: None  Lifestyle  . Physical activity:    Days per week: Not on file    Minutes per session: Not on file  . Stress: Not on file   Relationships  . Social connections:    Talks on phone: Not on file    Gets together: Not on file    Attends religious service: Not on file    Active member of club or organization: Not on file    Attends meetings of clubs or organizations: Not on file    Relationship status: Not on file  Other Topics Concern  . Not on file  Social History Narrative  . Not on file    Family History: Family History  Problem Relation Age of Onset  . Healthy Mother   . Healthy Father   . Hypertension Other   . Cancer Other     Allergies: No Known Allergies  Medications Prior to Admission  Medication Sig Dispense Refill Last Dose  . amLODipine (NORVASC) 5 MG tablet Take 1 tablet (5 mg total) by mouth daily. 30 tablet 11 Taking  . butalbital-acetaminophen-caffeine (FIORICET, ESGIC) 50-325-40 MG tablet Take 2 tablets by mouth every 8 (eight) hours as needed for headache. 30 tablet 2 Taking  . Elastic Bandages & Supports (COMFORT FIT MATERNITY SUPP  SM) MISC Wear as directed. 1 each 0 Taking  . famotidine (PEPCID) 20 MG tablet Take 20 mg by mouth 2 (two) times daily.   Taking  . ferrous fumarate (HEMOCYTE - 106 MG FE) 325 (106 Fe) MG TABS tablet Take 1 tablet (106 mg of iron total) by mouth every morning. 30 each 11 Taking  . labetalol (NORMODYNE) 200 MG tablet Take 2 tablets (400 mg total) by mouth 2 (two) times daily. 120 tablet 4 Taking  . omeprazole (PRILOSEC) 20 MG capsule Take 1 capsule (20 mg total) by mouth daily as needed (GERD). 60 capsule 5 Taking  . ondansetron (ZOFRAN) 8 MG tablet Take 1 tablet (8 mg total) by mouth every 8 (eight) hours as needed for nausea. 30 tablet 0 Taking  . Prenat-Fe Poly-Methfol-FA-DHA (VITAFOL ULTRA) 29-0.6-0.4-200 MG CAPS Take 1 capsule by mouth daily before breakfast. 90 capsule 4 Taking  . promethazine (PHENERGAN) 25 MG tablet Take 1 tablet (25 mg total) by mouth every 6 (six) hours as needed for nausea or vomiting. 30 tablet 1 Taking  . ranitidine (ZANTAC)  150 MG tablet Take 150 mg by mouth 2 (two) times daily.  5 Taking     Review of Systems  All systems reviewed and negative except as stated in HPI  Physical Exam Blood pressure (!) 142/95, pulse 84, temperature 98.1 F (36.7 C), temperature source Axillary, resp. rate 18, height 5\' 6"  (1.676 m), weight 99.4 kg. General appearance: alert, oriented, NAD Lungs: normal respiratory effort Heart: regular rate Abdomen: soft, non-tender; gravid, FH appropriate for GA Extremities: No calf swelling or tenderness Presentation: cephalic Fetal monitoring: 130s/mod var/+ acels/no decels Uterine activity: irregular contractions Dilation: 2.5 Effacement (%): 50, 60 Station: -2, -3 Exam by:: Milus GlazierJennifer Hamilton RN  Prenatal labs: ABO, Rh: O/Positive/-- (06/12 1147) Antibody: Negative (06/12 1147) Rubella: 1.14 (06/12 1147) RPR: Non Reactive (09/23 0828)  HBsAg: Negative (06/12 1147)  HIV: Non Reactive (09/23 0828)  GC/Chlamydia: negative GBS: Positive (11/14 1052)  2-hr GTT: normal 66/695/93 Genetic screening:  Panorama low risk  Anatomy US: normal   Prenatal Transfer Tool  Maternal Diabetes: No Genetic Screening: Normal Maternal Ultrasounds/Referrals: Normal Fetal Ultrasounds or other Referrals:  None Maternal Substance Abuse:  No Significant Maternal Medications:  None Significant Maternal Lab Results: None  Results for orders placed or performed during the hospital encounter of 11/10/18 (from the past 24 hour(s))  CBC   Collection Time: 11/10/18 10:50 AM  Result Value Ref Range   WBC 9.8 4.0 - 10.5 K/uL   RBC 3.33 (L) 3.87 - 5.11 MIL/uL   Hemoglobin 10.5 (L) 12.0 - 15.0 g/dL   HCT 81.132.0 (L) 91.436.0 - 78.246.0 %   MCV 96.1 80.0 - 100.0 fL   MCH 31.5 26.0 - 34.0 pg   MCHC 32.8 30.0 - 36.0 g/dL   RDW 95.613.5 21.311.5 - 08.615.5 %   Platelets 244 150 - 400 K/uL   nRBC 0.0 0.0 - 0.2 %    Patient Active Problem List   Diagnosis Date Noted  . Chronic hypertension affecting pregnancy 11/10/2018   . Encounter for induction of labor 11/10/2018  . Chronic hypertension during pregnancy, antepartum 07/11/2018  . Grand multiparity 07/11/2018  . Supervision of high risk pregnancy, antepartum 06/06/2018  . Post partum depression 01/09/2014  . Panic disorder with agoraphobia and moderate panic attacks 01/09/2014  . Hx of preeclampsia, prior pregnancy, currently pregnant 11/19/2013    Assessment: Anson Croftsakiya Boehm is a 37 y.o. [id number] at 8842w0d here for IOL for  poorly controlled cHTN  #Labor: start induction with cytotec #Pain: Desires epidural  #FWB: Cat 1 strip #ID:  GBS + #MOF: bottle #MOC: BTL  #Circ:  NA  Gwenevere Abbot, MD  Ob Fellow  11/10/2018, 11:38 AM

## 2018-11-10 NOTE — Anesthesia Preprocedure Evaluation (Signed)
Anesthesia Evaluation  Patient identified by MRN, date of birth, ID band Patient awake    Reviewed: Allergy & Precautions, H&P , NPO status , Patient's Chart, lab work & pertinent test results, reviewed documented beta blocker date and time   Airway Mallampati: II  TM Distance: >3 FB Neck ROM: full    Dental no notable dental hx.    Pulmonary neg pulmonary ROS, former smoker,    Pulmonary exam normal breath sounds clear to auscultation       Cardiovascular hypertension, negative cardio ROS Normal cardiovascular exam Rhythm:regular Rate:Normal     Neuro/Psych PSYCHIATRIC DISORDERS Anxiety Depression negative neurological ROS     GI/Hepatic negative GI ROS, Neg liver ROS,   Endo/Other  Morbid obesity  Renal/GU negative Renal ROS  negative genitourinary   Musculoskeletal   Abdominal   Peds  Hematology negative hematology ROS (+)   Anesthesia Other Findings   Reproductive/Obstetrics (+) Pregnancy                             Anesthesia Physical Anesthesia Plan  ASA: III  Anesthesia Plan: Epidural   Post-op Pain Management:    Induction:   PONV Risk Score and Plan: 2  Airway Management Planned:   Additional Equipment:   Intra-op Plan:   Post-operative Plan:   Informed Consent: I have reviewed the patients History and Physical, chart, labs and discussed the procedure including the risks, benefits and alternatives for the proposed anesthesia with the patient or authorized representative who has indicated his/her understanding and acceptance.     Plan Discussed with: CRNA, Anesthesiologist and Surgeon  Anesthesia Plan Comments:         Anesthesia Quick Evaluation

## 2018-11-10 NOTE — Progress Notes (Signed)
Anson Croftsakiya Graig is a 37 y.o. [id number] at 6849w0d  admitted for induction of labor due to Hypertension requiring multiple medications.  Subjective:  No complaints at this time. Patient wishing she could eat more than clear liquids. Also requesting exam and possible AROM. Upset that this labor is taking longer than her past labors.  Objective: BP (!) 156/96   Pulse 82   Temp 98.8 F (37.1 C) (Oral)   Resp 17   Ht 5\' 6"  (1.676 m)   Wt 99.4 kg   LMP  (LMP Unknown)   BMI 35.36 kg/m  No intake/output data recorded. No intake/output data recorded.  FHT:  FHR: 140 bpm, variability: moderate,  accelerations:  Present,  decelerations:  Absent UC:   regular, every 2 minutes SVE:   5-6/80/-1, AROM and IUPC placed after patient consent.  Labs: Lab Results  Component Value Date   WBC 9.8 11/10/2018   HGB 10.5 (L) 11/10/2018   HCT 32.0 (L) 11/10/2018   MCV 96.1 11/10/2018   PLT 244 11/10/2018    Assessment / Plan: Induction of labor due [facility],  progressing well on pitocin  Labor: Progressing normally, AROM and IUPC placed  Preeclampsia:  labs stable Fetal Wellbeing:  Category I Pain Control:  Epidural I/D:  n/a Anticipated MOD:  NSVD  Thressa ShellerHeather Rosario Duey 11/10/2018, 9:24 PM

## 2018-11-10 NOTE — Progress Notes (Signed)
Spoke to pt regarding need to delay induction this AM to 10am. Pt verbalized understanding need to change induction to 10am due to no bed being available on L&D but states she may come to hospital to wait.

## 2018-11-10 NOTE — Anesthesia Pain Management Evaluation Note (Signed)
  CRNA Pain Management Visit Note  Patient: Paula Dominguez, 37 y.o., female  "Hello I am a [facility] anesthesia [facility]. We have an anesthesia team available at all times to provide care throughout the hospital, including epidural management and anesthesia for C-section. I don't know your plan for the delivery whether it a natural birth, water birth, IV sedation, nitrous supplementation, doula or epidural, but we want to meet your pain goals."   1.Was your pain managed to your expectations on prior hospitalizations?   Yes   2.What is your expectation for pain management during this hospitalization?     Epidural  3.How can we help you reach that goal? Maintain epidural until delivery of infant.  Record the patient's initial score and the patient's pain goal.   Pain: 10, now a 2 with epidural.  Pain Goal: 2 [facility] wants you to be able to say your pain was always managed very well.  Paula Dominguez 11/10/2018

## 2018-11-10 NOTE — Progress Notes (Signed)
LABOR PROGRESS NOTE  Paula Dominguez is a 37 y.o. [id number] at 4963w0d  admitted for IOL for cHTN  Subjective: Comfortable with epidural Anxious to have a baby  Objective: BP 134/87   Pulse 80   Temp 98.2 F (36.8 C) (Oral)   Resp 16   Ht 5\' 6"  (1.676 m)   Wt 99.4 kg   LMP  (LMP Unknown)   BMI 35.36 kg/m  or  Vitals:   11/10/18 1531 11/10/18 1601 11/10/18 1631 11/10/18 1701  BP: (!) 137/91 (!) 142/92 (!) 138/93 134/87  Pulse: 85 82 84 80  Resp: 16     Temp:      TempSrc:      Weight:      Height:        Dilation: 4 Effacement (%): 70 Cervical Position: Middle Station: -3 Presentation: Vertex Exam by:: Dr. Janina MayoPhillip FHT: baseline rate 140s, moderate varibility, + acel (recently less), no decel Toco: regular contractions q3588m  Labs: Lab Results  Component Value Date   WBC 9.8 11/10/2018   HGB 10.5 (L) 11/10/2018   HCT 32.0 (L) 11/10/2018   MCV 96.1 11/10/2018   PLT 244 11/10/2018    Patient Active Problem List   Diagnosis Date Noted  . Chronic hypertension affecting pregnancy 11/10/2018  . Encounter for induction of labor 11/10/2018  . Chronic hypertension during pregnancy, antepartum 07/11/2018  . Grand multiparity 07/11/2018  . Supervision of high risk pregnancy, antepartum 06/06/2018  . Post partum depression 01/09/2014  . Panic disorder with agoraphobia and moderate panic attacks 01/09/2014  . Hx of preeclampsia, prior pregnancy, currently pregnant 11/19/2013    Assessment / Plan: 37 y.o. [id number] at 3963w0d here for IOL for cHTN  Labor: Bishop favorable to start pitocin Fetal Wellbeing:  Cat 2 strip Pain Control:  Epidural  Anticipated MOD:  SVD  Gwenevere AbbotNimeka Ralphie Lovelady, MD OB Fellow  11/10/2018, 5:36 PM

## 2018-11-11 LAB — COMPREHENSIVE METABOLIC PANEL
ALT: 9 U/L (ref 0–44)
AST: 19 U/L (ref 15–41)
Albumin: 2.7 g/dL — ABNORMAL LOW (ref 3.5–5.0)
Alkaline Phosphatase: 127 U/L — ABNORMAL HIGH (ref 38–126)
Anion gap: 9 (ref 5–15)
BUN: 5 mg/dL — ABNORMAL LOW (ref 6–20)
CO2: 21 mmol/L — ABNORMAL LOW (ref 22–32)
Calcium: 8.7 mg/dL — ABNORMAL LOW (ref 8.9–10.3)
Chloride: 106 mmol/L (ref 98–111)
Creatinine, Ser: 0.58 mg/dL (ref 0.44–1.00)
GFR calc Af Amer: >60 mL/min (ref >60)
GFR calc non Af Amer: >60 mL/min (ref >60)
Glucose, Bld: 126 mg/dL — ABNORMAL HIGH (ref 70–99)
Potassium: 3.2 mmol/L — ABNORMAL LOW (ref 3.5–5.1)
Sodium: 136 mmol/L (ref 135–145)
Total Bilirubin: 0.3 mg/dL (ref 0.3–1.2)
Total Protein: 6.1 g/dL — ABNORMAL LOW (ref 6.5–8.1)

## 2018-11-11 LAB — CBC
HCT: 31.7 % — ABNORMAL LOW (ref 36.0–46.0)
Hemoglobin: 10.4 g/dL — ABNORMAL LOW (ref 12.0–15.0)
MCH: 31.3 pg (ref 26.0–34.0)
MCHC: 32.8 g/dL (ref 30.0–36.0)
MCV: 95.5 fL (ref 80.0–100.0)
Platelets: 233 10*3/uL (ref 150–400)
RBC: 3.32 MIL/uL — ABNORMAL LOW (ref 3.87–5.11)
RDW: 13.5 % (ref 11.5–15.5)
WBC: 12.4 10*3/uL — ABNORMAL HIGH (ref 4.0–10.5)
nRBC: 0 % (ref 0.0–0.2)

## 2018-11-11 LAB — RPR: RPR: NONREACTIVE

## 2018-11-11 LAB — PROTEIN / CREATININE RATIO, URINE
Creatinine, Urine: 287 mg/dL
Protein Creatinine Ratio: 0.2 mg/mg{Cre} — ABNORMAL HIGH (ref 0.00–0.15)
Total Protein, Urine: 57 mg/dL

## 2018-11-11 MED ORDER — DIPHENHYDRAMINE HCL 25 MG PO CAPS
25.0000 mg | ORAL_CAPSULE | Freq: Four times a day (QID) | ORAL | Status: DC | PRN
  Administered 2018-11-11 (×2): 25 mg via ORAL
  Filled 2018-11-11 (×3): qty 1

## 2018-11-11 MED ORDER — SIMETHICONE 80 MG PO CHEW
80.0000 mg | CHEWABLE_TABLET | ORAL | Status: DC | PRN
  Administered 2018-11-11 – 2018-11-12 (×3): 80 mg via ORAL
  Filled 2018-11-11 (×2): qty 1

## 2018-11-11 MED ORDER — LABETALOL HCL 200 MG PO TABS
400.0000 mg | ORAL_TABLET | Freq: Two times a day (BID) | ORAL | Status: DC
  Administered 2018-11-11 – 2018-11-12 (×3): 400 mg via ORAL
  Filled 2018-11-11 (×3): qty 2

## 2018-11-11 MED ORDER — CYCLOBENZAPRINE HCL 10 MG PO TABS: 10.0000 mg | ORAL_TABLET | Freq: Once | ORAL | Status: DC

## 2018-11-11 MED ORDER — TETANUS-DIPHTH-ACELL PERTUSSIS 5-2.5-18.5 LF-MCG/0.5 IM SUSP: 0.5000 mL | Freq: Once | INTRAMUSCULAR | Status: DC

## 2018-11-11 MED ORDER — OXYCODONE-ACETAMINOPHEN 5-325 MG PO TABS
2.0000 | ORAL_TABLET | ORAL | Status: DC | PRN
  Administered 2018-11-11 – 2018-11-12 (×6): 2 via ORAL
  Filled 2018-11-11 (×6): qty 2

## 2018-11-11 MED ORDER — AMLODIPINE BESYLATE 10 MG PO TABS
10.0000 mg | ORAL_TABLET | Freq: Every day | ORAL | Status: DC
  Administered 2018-11-12: 10 mg via ORAL
  Filled 2018-11-11 (×2): qty 1

## 2018-11-11 MED ORDER — ONDANSETRON HCL 4 MG PO TABS: 4.0000 mg | ORAL_TABLET | ORAL | Status: DC | PRN

## 2018-11-11 MED ORDER — BENZOCAINE-MENTHOL 20-0.5 % EX AERO
1.0000 "application " | INHALATION_SPRAY | CUTANEOUS | Status: DC | PRN
  Administered 2018-11-11: 1 via TOPICAL
  Filled 2018-11-11: qty 56

## 2018-11-11 MED ORDER — SENNOSIDES-DOCUSATE SODIUM 8.6-50 MG PO TABS
2.0000 | ORAL_TABLET | ORAL | Status: DC
  Administered 2018-11-11: 2 via ORAL
  Filled 2018-11-11: qty 2

## 2018-11-11 MED ORDER — AMLODIPINE BESYLATE 5 MG PO TABS
5.0000 mg | ORAL_TABLET | Freq: Every day | ORAL | Status: DC
  Administered 2018-11-11: 5 mg via ORAL
  Filled 2018-11-11 (×2): qty 1

## 2018-11-11 MED ORDER — FAMOTIDINE 20 MG PO TABS
20.0000 mg | ORAL_TABLET | Freq: Every day | ORAL | Status: DC
  Administered 2018-11-11 – 2018-11-12 (×2): 20 mg via ORAL
  Filled 2018-11-11 (×2): qty 1

## 2018-11-11 MED ORDER — NICOTINE 14 MG/24HR TD PT24
14.0000 mg | MEDICATED_PATCH | Freq: Every day | TRANSDERMAL | Status: DC
  Administered 2018-11-11 – 2018-11-12 (×2): 14 mg via TRANSDERMAL
  Filled 2018-11-11 (×3): qty 1

## 2018-11-11 MED ORDER — DIBUCAINE 1 % RE OINT: 1.0000 "application " | TOPICAL_OINTMENT | RECTAL | Status: DC | PRN

## 2018-11-11 MED ORDER — PANTOPRAZOLE SODIUM 40 MG PO TBEC
40.0000 mg | DELAYED_RELEASE_TABLET | Freq: Every day | ORAL | Status: DC
  Administered 2018-11-11 – 2018-11-12 (×2): 40 mg via ORAL
  Filled 2018-11-11 (×2): qty 1

## 2018-11-11 MED ORDER — PRENATAL MULTIVITAMIN CH
1.0000 | ORAL_TABLET | Freq: Every day | ORAL | Status: DC
  Administered 2018-11-11: 1 via ORAL
  Filled 2018-11-11: qty 1

## 2018-11-11 MED ORDER — OXYCODONE-ACETAMINOPHEN 5-325 MG PO TABS
1.0000 | ORAL_TABLET | ORAL | Status: DC | PRN
  Filled 2018-11-11: qty 1

## 2018-11-11 MED ORDER — ACETAMINOPHEN 325 MG PO TABS: 650.0000 mg | ORAL_TABLET | ORAL | Status: DC | PRN

## 2018-11-11 MED ORDER — IBUPROFEN 600 MG PO TABS
600.0000 mg | ORAL_TABLET | Freq: Four times a day (QID) | ORAL | Status: DC
  Administered 2018-11-11 (×3): 600 mg via ORAL
  Filled 2018-11-11 (×3): qty 1

## 2018-11-11 MED ORDER — BUTALBITAL-APAP-CAFFEINE 50-325-40 MG PO TABS
1.0000 | ORAL_TABLET | Freq: Once | ORAL | Status: AC
  Administered 2018-11-11: 1 via ORAL
  Filled 2018-11-11: qty 1

## 2018-11-11 MED ORDER — ONDANSETRON HCL 4 MG/2ML IJ SOLN: 4.0000 mg | INTRAMUSCULAR | Status: DC | PRN

## 2018-11-11 MED ORDER — COCONUT OIL OIL: 1.0000 "application " | TOPICAL_OIL | Status: DC | PRN

## 2018-11-11 MED ORDER — KETOROLAC TROMETHAMINE 30 MG/ML IJ SOLN
30.0000 mg | Freq: Three times a day (TID) | INTRAMUSCULAR | Status: DC | PRN
  Administered 2018-11-11 – 2018-11-12 (×2): 30 mg via INTRAVENOUS
  Filled 2018-11-11 (×2): qty 1

## 2018-11-11 MED ORDER — WITCH HAZEL-GLYCERIN EX PADS: 1.0000 "application " | MEDICATED_PAD | CUTANEOUS | Status: DC | PRN

## 2018-11-11 MED ORDER — ZOLPIDEM TARTRATE 5 MG PO TABS: 5.0000 mg | ORAL_TABLET | Freq: Every evening | ORAL | Status: DC | PRN

## 2018-11-11 NOTE — Addendum Note (Signed)
Addendum  created 11/11/18 [id number] by Graciela HusbandsFussell, Skila Rollins O, CRNA   Charge Capture section accepted, Sign clinical note

## 2018-11-11 NOTE — Progress Notes (Signed)
POSTPARTUM PROGRESS NOTE  Post Partum Day 1  Subjective:  Paula Dominguez is a 37 y.o. [id number] s/p NSVD c/b poorly controlled cHTN at 5460w0d.  She reports she is doing well. No acute events overnight. She denies any problems with ambulating, voiding or po intake. Denies nausea or vomiting.  Pain is poorly controlled. She is experiencing constant abdominal cramping c/w post-delivery pain.  Lochia is stable. No BMs, dizziness, SOB, or problems ambulating.  Objective: Blood pressure 135/68, pulse 77, temperature 98.8 F (37.1 C), temperature source Oral, resp. rate 18, height 5\' 6"  (1.676 m), weight 99.4 kg, unknown if currently breastfeeding.  Physical Exam:  General: alert, cooperative and no distress Chest: no respiratory distress Heart:regular rate, distal pulses intact Abdomen: soft, nontender,  Uterine Fundus: firm, appropriately tender DVT Evaluation: No calf swelling or tenderness Extremities: No edema Skin: warm, dry  Recent Labs    11/10/18 1050  HGB 10.5*  HCT 32.0*    Assessment/Plan: Paula Dominguez is a 37 y.o. A5W0981G8P7017 s/p NSVD at 7760w0d`   PPD#1 - Doing well  Routine postpartum care  Contraception: Depo (either as long term plan or bridge to IUD at postpartum visit) Feeding: Breast Dispo: Plan for discharge tomorrow   LOS: 1 day   Raul DelFrancie Jenkins, MS3 11/11/2018, 8:06 AM  I confirm that I have verified the information documented in the medical student's note and that I have also personally reperformed the physical exam and all medical decision making activities.   Thressa ShellerHeather Hogan 8:45 AM 11/11/18

## 2018-11-11 NOTE — Anesthesia Postprocedure Evaluation (Signed)
Anesthesia Post Note  Patient: Paula Dominguez  Procedure(s) Performed: AN AD HOC LABOR EPIDURAL     Patient location during evaluation: Mother Baby Anesthesia Type: Epidural Level of consciousness: awake and alert and oriented Pain management: satisfactory to patient Vital Signs Assessment: post-procedure vital signs reviewed and stable Respiratory status: respiratory function stable Cardiovascular status: stable Postop Assessment: no headache, no backache, epidural receding, patient able to bend at knees, no signs of nausea or vomiting and adequate PO intake Anesthetic complications: no    Last Vitals:  Vitals:   11/11/18 0329 11/11/18 0821  BP: 135/68 (!) 143/93  Pulse: 77 77  Resp: 18 18  Temp: 37.1 C 36.7 C    Last Pain:  Vitals:   11/11/18 0821  TempSrc: Oral  PainSc: 9    Pain Goal: Patients Stated Pain Goal: 0 (11/10/18 2242)               Lorrie Gargan

## 2018-11-11 NOTE — Progress Notes (Signed)
CSW met with MOB via bedside to provide any support needed. MOB was pleasant and appropriate during conversation. MOB currently lives at home with her 5 other children (7 total) and spouse, Marcus. MOB was open about her previous history with having PPD and states she may experience it again with this pregnancy. MOB states she has already had some anxiety and relates it to being older and having a new baby in the house but states it has not been overwhelming. MOB was previously on xanax- CSW questioned if MOB was interested in taking that medication again. MOB stated she did not think she needed any medication at this time but felt comfortable speaking with her family/ supports and OBGYN about her emotions. MOB was in very good spirits and stated she was excited to get home. MOB has a 24 year old son who is flew home to help out and her spouse is a truck driver.    CSW provided education regarding Baby Blues vs PMADs and provided MOB with resources for mental health follow up.  CSW encouraged MOB to evaluate her mental health throughout the postpartum period with the use of the New Mom Checklist developed by [facility] as well as the Edinburgh Postnatal Depression Scale and notify a medical professional if symptoms arise.    MOB voiced no concerns at this time.   Marlisha Vanwyk, LCSW Clinical Social Worker  [facility]  [phone]   

## 2018-11-11 NOTE — Progress Notes (Addendum)
Called and updated MD with the patient's signs/symptoms: blurred vision, epigastric pain, HA, and lower extremity swelling. VS stable. BP 144/88. MD on route to see patient. Made MD aware that patient states she passed 3 large clots throughout the day. Also made MD aware of urine test that resulted earlier in the day and detected protein in the urine. Patient instructed to save future clots for RN to see.  Update: MD did see patient. New orders for IV toradol and PO protonix.

## 2018-11-11 NOTE — Lactation Note (Signed)
This note was copied from a baby's chart. Lactation Consultation Note  Patient [facility]: Paula Dominguez   [facility]  Mother's feeding choice on admission was listed as breast/bottle.  After meeting mother she stated that she is bottle feeding only.                    Aijah Lattner [name], 3:07 PM

## 2018-11-12 ENCOUNTER — Encounter (HOSPITAL_COMMUNITY): Payer: Self-pay

## 2018-11-12 ENCOUNTER — Other Ambulatory Visit (HOSPITAL_COMMUNITY): Payer: BLUE CROSS/BLUE SHIELD

## 2018-11-12 MED ORDER — NICOTINE 14 MG/24HR TD PT24: 14.0000 mg | MEDICATED_PATCH | Freq: Every day | TRANSDERMAL | 1 refills | Status: DC

## 2018-11-12 MED ORDER — OXYCODONE-ACETAMINOPHEN 5-325 MG PO TABS: 1.0000 | ORAL_TABLET | ORAL | 0 refills | Status: DC | PRN

## 2018-11-12 MED ORDER — AMLODIPINE BESYLATE 10 MG PO TABS: 10.0000 mg | ORAL_TABLET | Freq: Every day | ORAL | 3 refills | Status: DC

## 2018-11-12 MED ORDER — LABETALOL HCL 200 MG PO TABS: 400.0000 mg | ORAL_TABLET | Freq: Three times a day (TID) | ORAL | 3 refills | Status: DC

## 2018-11-12 MED ORDER — IBUPROFEN 600 MG PO TABS: 600.0000 mg | ORAL_TABLET | Freq: Four times a day (QID) | ORAL | 0 refills | Status: DC | PRN

## 2018-11-12 MED ORDER — POLYETHYLENE GLYCOL 3350 17 G PO PACK: PACK | ORAL | 0 refills | Status: DC

## 2018-11-12 NOTE — Discharge Summary (Addendum)
Obstetrics Discharge Summary OB/GYN Faculty Practice   Patient Name: Paula Dominguez DOB: [birth year] MRN: [id number]  Date of admission: 11/10/2018 Delivering MD: Thressa Sheller D   Date of discharge: 11/12/2018  Admitting diagnosis: 37 WKS INDUCTION Intrauterine pregnancy: [redacted]w[redacted]d     Secondary diagnosis:   Active Problems:   Chronic hypertension affecting pregnancy   Encounter for induction of labor  Additional problems:  . History of panic disorder, panic attacks . History of postpartum depression . History of preeclampsia - not on aspirin this pregnancy  . Iron deficiency anemia . History of genital warts . Chronic Headaches - on Fioricet . Mixed hyperlipidemia . Tobacco Use      Discharge diagnosis: Term Pregnancy Delivered                                            Postpartum procedures: None  Complications: 1st degree perineal laceration, repaired   Outpatient Follow-Up [ ]  mood check - message sent to clinic to schedule in 1-2 weeks [ ]  BP check - message sent to clinic to schedule in 1-2 weeks, baby love visit  amlodipine increased on day of discharge  [ ]  chronic headaches - ensure weaning off of fioricet [ ]  continue counseling regarding birth control  [ ]  tobacco cessation - discharged with nicotine patches   Hospital course: Paula Dominguez is a 37 y.o. [redacted]w[redacted]d who was admitted for induction of labor for poorly-controlled chronic hypertension. Her pregnancy was complicated by the above noted items. Her labor course was notable for epidural placement, induction with cytotec. Delivery was uncomplicated. Please see delivery/op note for additional details. Her postpartum course was complicated by anxiety, back pain and increased uterine contractions - these were both improved with heating pads and prn oxycodone use. She was bottlefeeding. By day of discharge, she was passing urinating, eating and drinking without difficulty. She was still having trouble passing gas and had  not had a bowel movement. She denied abdominal pain but reported she was scared to use the bathroom because of pain. She was discharged home with Miralax and encouraged to increase hydration. She was discharged home with percocet 5-325mg  #10 prn and ibuprofen.  For her anxiety, a message was sent to schedule a mood check in 1-2 weeks. She reports anxiety because of being in the hospital and wanting to be at home. She will follow-up in clinic in 1-2 weeks for a BP check and in 4-6 weeks.   Physical exam  Vitals:   11/11/18 1950 11/11/18 2315 11/12/18 0541 11/12/18 1240  BP: (!) 144/88 (!) 144/97 (!) 144/94 (!) 152/103  Pulse: 67 70 65   Resp:  18 18   Temp:  98.3 F (36.8 C) (!) 97.4 F (36.3 C)   TempSrc:  Oral Oral   SpO2:   98%   Weight:      Height:       General: well-appearing, NAD Lochia: appropriate Uterine Fundus: firm Incision: N/A DVT Evaluation: No significant calf/ankle edema. Labs: Lab Results  Component Value Date   WBC 12.4 (H) 11/11/2018   HGB 10.4 (L) 11/11/2018   HCT 31.7 (L) 11/11/2018   MCV 95.5 11/11/2018   PLT 233 11/11/2018   CMP Latest Ref Rng & Units 11/11/2018  Glucose 70 - 99 mg/dL 409(W)  BUN 6 - 20 mg/dL 5(L)  Creatinine 1.19 - 1.00 mg/dL 1.47  Sodium 829 -  145 mmol/L 136  Potassium 3.5 - 5.1 mmol/L 3.2(L)  Chloride 98 - 111 mmol/L 106  CO2 22 - 32 mmol/L 21(L)  Calcium 8.9 - 10.3 mg/dL 4.0(J8.7(L)  Total Protein 6.5 - 8.1 g/dL 6.1(L)  Total Bilirubin 0.3 - 1.2 mg/dL 0.3  Alkaline Phos 38 - 126 U/L 127(H)  AST 15 - 41 U/L 19  ALT 0 - 44 U/L 9    Discharge instructions: Per After Visit Summary and "Baby and Me Booklet"  After visit meds:  Allergies as of 11/12/2018   No Known Allergies     Medication List    STOP taking these medications   COMFORT FIT MATERNITY SUPP SM Misc   omeprazole 20 MG capsule Commonly known as:  PRILOSEC   ondansetron 8 MG tablet Commonly known as:  ZOFRAN   promethazine 25 MG tablet Commonly known as:   PHENERGAN   ranitidine 150 MG tablet Commonly known as:  ZANTAC     TAKE these medications   amLODipine 10 MG tablet Commonly known as:  NORVASC Take 1 tablet (10 mg total) by mouth daily. What changed:    medication strength  how much to take   butalbital-acetaminophen-caffeine 50-325-40 MG tablet Commonly known as:  FIORICET, ESGIC Take 2 tablets by mouth every 8 (eight) hours as needed for headache.   famotidine 20 MG tablet Commonly known as:  PEPCID Take 20 mg by mouth 2 (two) times daily as needed for heartburn.   ferrous fumarate 325 (106 Fe) MG Tabs tablet Commonly known as:  HEMOCYTE - 106 mg FE Take 1 tablet (106 mg of iron total) by mouth every morning.   ibuprofen 600 MG tablet Commonly known as:  ADVIL,MOTRIN Take 1 tablet (600 mg total) by mouth every 6 (six) hours as needed for moderate pain.   labetalol 200 MG tablet Commonly known as:  NORMODYNE Take 2 tablets (400 mg total) by mouth 3 (three) times daily.   nicotine 14 mg/24hr patch Commonly known as:  NICODERM CQ - dosed in mg/24 hours Place 1 patch (14 mg total) onto the skin daily. Start taking on:  11/13/2018   oxyCODONE-acetaminophen 5-325 MG tablet Commonly known as:  PERCOCET/ROXICET Take 1 tablet by mouth every 4 (four) hours as needed (pain scale 4-7).   polyethylene glycol packet Commonly known as:  MIRALAX / GLYCOLAX Use 1-2 packets daily for constipation.   VITAFOL ULTRA 29-0.6-0.4-200 MG Caps Take 1 capsule by mouth daily before breakfast.       Postpartum contraception: undecided - declined postpartum BTL  Diet: Routine Diet Activity: Advance as tolerated. Pelvic rest for 6 weeks.   Follow-up Appt: Future Appointments  Date Time Provider Department Center  11/14/2018  1:15 PM [facility] NURSE [facility] None   Follow-up Visit: message sent for postpartum visit and mood check  Newborn Data: Live born female  Birth Weight: 5 lb 12.6 oz (2625 g) APGAR: 8, 9  Newborn  Delivery   Birth date/time:  11/10/2018 21:52:00 Delivery type:  Vaginal, Spontaneous    Baby Feeding: Bottle Disposition:home with mother  Cristal DeerLaurel S. Earlene PlaterWallace, DO OB/GYN Fellow, Faculty Practice

## 2018-11-14 ENCOUNTER — Ambulatory Visit (INDEPENDENT_AMBULATORY_CARE_PROVIDER_SITE_OTHER): Payer: BLUE CROSS/BLUE SHIELD | Admitting: Obstetrics

## 2018-11-14 ENCOUNTER — Encounter: Payer: Self-pay | Admitting: Obstetrics

## 2018-11-14 ENCOUNTER — Ambulatory Visit: Payer: BLUE CROSS/BLUE SHIELD | Admitting: Obstetrics

## 2018-11-14 VITALS — BP 191/116 | HR 68

## 2018-11-14 DIAGNOSIS — R51 Headache: Secondary | ICD-10-CM

## 2018-11-14 DIAGNOSIS — R52 Pain, unspecified: Secondary | ICD-10-CM

## 2018-11-14 DIAGNOSIS — R519 Headache, unspecified: Secondary | ICD-10-CM

## 2018-11-14 LAB — BPAM RBC
BLOOD PRODUCT EXPIRATION DATE: 201912122359
Blood Product Expiration Date: 201912122359
UNIT TYPE AND RH: 5100
Unit Type and Rh: 5100

## 2018-11-14 LAB — TYPE AND SCREEN
ABO/RH(D): O POS
Antibody Screen: NEGATIVE
UNIT DIVISION: 0
Unit division: 0

## 2018-11-14 MED ORDER — BUTALBITAL-APAP-CAFFEINE 50-325-40 MG PO TABS: 2.0000 | ORAL_TABLET | Freq: Three times a day (TID) | ORAL | 2 refills | Status: AC | PRN

## 2018-11-14 MED ORDER — IBUPROFEN 800 MG PO TABS: 800.0000 mg | ORAL_TABLET | Freq: Three times a day (TID) | ORAL | 5 refills | Status: DC | PRN

## 2018-11-14 MED ORDER — TRAMADOL HCL 50 MG PO TABS: 50.0000 mg | ORAL_TABLET | Freq: Four times a day (QID) | ORAL | 2 refills | Status: DC | PRN

## 2018-11-14 NOTE — Progress Notes (Signed)
Pt presents for BP check. Pt delivered 11/10/18. Pt states that she is having headaches, blurred vision, and swelling. Pt states that she is having severe cramping. States that the pain is a 11 on a scales 0-10.  She states that the pain medication is not helping her. She states that the percocet just makes her head spin, but does not touch the pain. Ibuprofen was only prescribed at 600 mg.  Pt advised to go to [facility] for evaluation. Pt states that she is unable to go at this time. Pt states that she does not have adequate childcare at the moment. She states that she has a babysitter watching her newborn while she is here in the office. Pt states that she will try to go later tonight or tomorrow.   I have reviewed the chart and agree with nursing staff's documentation of this patient's encounter. I have encouraged compliance with meds.  Oxycodone is discontinued and Tramadol prescribed for pain along with Ibuprofen.  Fioricet is prescribed prn for headache.  She is instructed to go to hospital if headache don't respond to Fioricet or she develops visual changes along with worsening headaches.  A/P:  Chronic hypertension, postpartum.  Uncontrolled, but noncompliant. Follow up BP check in 24 hours.  Coral Ceoharles Doron Shake, MD 11/14/2018 3:45 PM

## 2018-11-15 ENCOUNTER — Encounter: Payer: BLUE CROSS/BLUE SHIELD | Admitting: Obstetrics

## 2018-11-19 ENCOUNTER — Other Ambulatory Visit (HOSPITAL_COMMUNITY): Payer: BLUE CROSS/BLUE SHIELD

## 2018-11-19 ENCOUNTER — Encounter: Payer: BLUE CROSS/BLUE SHIELD | Admitting: Obstetrics

## 2018-11-21 ENCOUNTER — Telehealth: Payer: Self-pay | Admitting: Obstetrics

## 2018-11-21 NOTE — Telephone Encounter (Signed)
Have called patient daily since 11-19-2018 to come to office for BP check, and she does not answer.  Messages left to come to office.  Brock BadHARLES A. HARPER MD 11-21-2018

## 2018-11-26 ENCOUNTER — Other Ambulatory Visit (HOSPITAL_COMMUNITY): Payer: BLUE CROSS/BLUE SHIELD

## 2018-11-27 ENCOUNTER — Inpatient Hospital Stay (HOSPITAL_COMMUNITY)
Admission: AD | Admit: 2018-11-27 | Discharge: 2018-11-27 | Disposition: A | Discharge status: Home / Self Care | Payer: BLUE CROSS/BLUE SHIELD | Source: Ambulatory Visit | Attending: Obstetrics and Gynecology | Admitting: Obstetrics and Gynecology

## 2018-11-27 ENCOUNTER — Telehealth: Payer: Self-pay

## 2018-11-27 ENCOUNTER — Other Ambulatory Visit: Payer: Self-pay

## 2018-11-27 ENCOUNTER — Encounter (HOSPITAL_COMMUNITY): Payer: Self-pay | Admitting: *Deleted

## 2018-11-27 DIAGNOSIS — Z87891 Personal history of nicotine dependence: Secondary | ICD-10-CM | POA: Insufficient documentation

## 2018-11-27 DIAGNOSIS — R030 Elevated blood-pressure reading, without diagnosis of hypertension: Secondary | ICD-10-CM | POA: Diagnosis present

## 2018-11-27 DIAGNOSIS — O1003 Pre-existing essential hypertension complicating the puerperium: Secondary | ICD-10-CM | POA: Insufficient documentation

## 2018-11-27 LAB — COMPREHENSIVE METABOLIC PANEL
ALBUMIN: 3.6 g/dL (ref 3.5–5.0)
ALK PHOS: 60 U/L (ref 38–126)
ALT: 14 U/L (ref 0–44)
AST: 22 U/L (ref 15–41)
Anion gap: 7 (ref 5–15)
BUN: 10 mg/dL (ref 6–20)
CALCIUM: 8.7 mg/dL — AB (ref 8.9–10.3)
CO2: 27 mmol/L (ref 22–32)
CREATININE: 0.68 mg/dL (ref 0.44–1.00)
Chloride: 106 mmol/L (ref 98–111)
GFR calc Af Amer: >60 mL/min (ref >60)
GFR calc non Af Amer: >60 mL/min (ref >60)
GLUCOSE: 113 mg/dL — AB (ref 70–99)
Potassium: 3.1 mmol/L — ABNORMAL LOW (ref 3.5–5.1)
SODIUM: 140 mmol/L (ref 135–145)
Total Bilirubin: 0.1 mg/dL — ABNORMAL LOW (ref 0.3–1.2)
Total Protein: 7 g/dL (ref 6.5–8.1)

## 2018-11-27 LAB — CBC WITH DIFFERENTIAL/PLATELET
Basophils Absolute: 0 10*3/uL (ref 0.0–0.1)
Basophils Relative: 0 %
EOS PCT: 3 %
Eosinophils Absolute: 0.2 10*3/uL (ref 0.0–0.5)
HEMATOCRIT: 34.8 % — AB (ref 36.0–46.0)
Hemoglobin: 11.2 g/dL — ABNORMAL LOW (ref 12.0–15.0)
LYMPHS PCT: 46 %
Lymphs Abs: 3.2 10*3/uL (ref 0.7–4.0)
MCH: 31.4 pg (ref 26.0–34.0)
MCHC: 32.2 g/dL (ref 30.0–36.0)
MCV: 97.5 fL (ref 80.0–100.0)
MONO ABS: 0.2 10*3/uL (ref 0.1–1.0)
MONOS PCT: 3 %
NEUTROS ABS: 3.3 10*3/uL (ref 1.7–7.7)
Neutrophils Relative %: 48 %
Platelets: 291 10*3/uL (ref 150–400)
RBC: 3.57 MIL/uL — AB (ref 3.87–5.11)
RDW: 13.5 % (ref 11.5–15.5)
WBC: 6.9 10*3/uL (ref 4.0–10.5)
nRBC: 0 % (ref 0.0–0.2)

## 2018-11-27 LAB — PROTEIN / CREATININE RATIO, URINE
CREATININE, URINE: 351 mg/dL
Protein Creatinine Ratio: 0.05 mg/mg{Cre} (ref 0.00–0.15)
Total Protein, Urine: 17 mg/dL

## 2018-11-27 LAB — URINALYSIS, ROUTINE W REFLEX MICROSCOPIC
BACTERIA UA: NONE SEEN
GLUCOSE, UA: NEGATIVE mg/dL
Ketones, ur: 5 mg/dL — AB
NITRITE: NEGATIVE
Protein, ur: 30 mg/dL — AB
SPECIFIC GRAVITY, URINE: 1.034 — AB (ref 1.005–1.030)
pH: 5 (ref 5.0–8.0)

## 2018-11-27 MED ORDER — CYCLOBENZAPRINE HCL 10 MG PO TABS
10.0000 mg | ORAL_TABLET | Freq: Once | ORAL | Status: AC
  Administered 2018-11-27: 10 mg via ORAL
  Filled 2018-11-27: qty 1

## 2018-11-27 MED ORDER — HYDRALAZINE HCL 20 MG/ML IJ SOLN
10.0000 mg | Freq: Once | INTRAMUSCULAR | Status: AC
  Administered 2018-11-27: 10 mg via INTRAVENOUS
  Filled 2018-11-27: qty 1

## 2018-11-27 MED ORDER — KETOROLAC TROMETHAMINE 60 MG/2ML IM SOLN
60.0000 mg | Freq: Once | INTRAMUSCULAR | Status: AC
  Administered 2018-11-27: 60 mg via INTRAMUSCULAR
  Filled 2018-11-27: qty 2

## 2018-11-27 MED ORDER — HYDROCHLOROTHIAZIDE 25 MG PO TABS
25.0000 mg | ORAL_TABLET | Freq: Once | ORAL | Status: AC
  Administered 2018-11-27: 25 mg via ORAL
  Filled 2018-11-27: qty 1

## 2018-11-27 MED ORDER — HYDROCHLOROTHIAZIDE 25 MG PO TABS: 25.0000 mg | ORAL_TABLET | Freq: Every day | ORAL | 0 refills | Status: DC

## 2018-11-27 MED ORDER — POTASSIUM CHLORIDE CRYS ER 20 MEQ PO TBCR
40.0000 meq | EXTENDED_RELEASE_TABLET | Freq: Two times a day (BID) | ORAL | Status: DC
  Administered 2018-11-27: 40 meq via ORAL
  Filled 2018-11-27 (×2): qty 2

## 2018-11-27 MED ORDER — LABETALOL HCL 100 MG PO TABS
400.0000 mg | ORAL_TABLET | Freq: Once | ORAL | Status: AC
  Administered 2018-11-27: 400 mg via ORAL
  Filled 2018-11-27: qty 4

## 2018-11-27 MED ORDER — KETOROLAC TROMETHAMINE 10 MG PO TABS: 10.0000 mg | ORAL_TABLET | Freq: Four times a day (QID) | ORAL | 0 refills | Status: DC | PRN

## 2018-11-27 MED ORDER — POTASSIUM CHLORIDE CRYS ER 20 MEQ PO TBCR: 40.0000 meq | EXTENDED_RELEASE_TABLET | Freq: Two times a day (BID) | ORAL | 0 refills | Status: DC

## 2018-11-27 MED ORDER — CYCLOBENZAPRINE HCL 10 MG PO TABS: 10.0000 mg | ORAL_TABLET | Freq: Two times a day (BID) | ORAL | 0 refills | Status: DC | PRN

## 2018-11-27 NOTE — Discharge Instructions (Signed)
Postpartum Hypertension °Postpartum hypertension is high blood pressure after pregnancy that remains higher than normal for more than two days after delivery. You may not realize that you have postpartum hypertension if your blood pressure is not being checked regularly. In some cases, postpartum hypertension will go away on its own, usually within a week of delivery. However, for some women, medical treatment is required to prevent serious complications, such as seizures or stroke. °The following things can affect your blood pressure: °· The type of delivery you had. °· Having received IV fluids or other medicines during or after delivery. ° °What are the causes? °Postpartum hypertension may be caused by any of the following or by a combination of any of the following: °· Hypertension that existed before pregnancy (chronic hypertension). °· Gestational hypertension. °· Preeclampsia or eclampsia. °· Receiving a lot of fluid through an IV during or after delivery. °· Medicines. °· HELLP syndrome. °· Hyperthyroidism. °· Stroke. °· Other rare neurological or blood disorders. ° °In some cases, the cause may not be known. °What increases the risk? °Postpartum hypertension can be related to one or more risk factors, such as: °· Chronic hypertension. In some cases, this may not have been diagnosed before pregnancy. °· Obesity. °· Type 2 diabetes. °· Kidney disease. °· Family history of preeclampsia. °· Other medical conditions that cause hormonal imbalances. ° °What are the signs or symptoms? °As with all types of hypertension, postpartum hypertension may not have any symptoms. Depending on how high your blood pressure is, you may experience: °· Headaches. These may be mild, moderate, or severe. They may also be steady, constant, or sudden in onset (thunderclap headache). °· Visual changes. °· Dizziness. °· Shortness of breath. °· Swelling of your hands, feet, lower legs, or face. In some cases, you may have swelling in  more than one of these locations. °· Heart palpitations or a racing heartbeat. °· Difficulty breathing while lying down. °· Decreased urination. ° °Other rare signs and symptoms may include: °· Sweating more than usual. This lasts longer than a few days after delivery. °· Chest pain. °· Sudden dizziness when you get up from sitting or lying down. °· Seizures. °· Nausea or vomiting. °· Abdominal pain. ° °How is this diagnosed? °The diagnosis of postpartum hypertension is made through a combination of physical examination findings and testing of your blood and urine. You may also have additional tests, such as a CT scan or an MRI, to check for other complications of postpartum hypertension. °How is this treated? °When blood pressure is high enough to require treatment, your options may include: °· Medicines to reduce blood pressure (antihypertensives). Tell your health care provider if you are breastfeeding or if you plan to breastfeed. There are many antihypertensive medicines that are safe to take while breastfeeding. °· Stopping medicines that may be causing hypertension. °· Treating medical conditions that are causing hypertension. °· Treating the complications of hypertension, such as seizures, stroke, or kidney problems. ° °Your health care provider will also continue to monitor your blood pressure closely and repeatedly until it is within a safe range for you. °Follow these instructions at home: °· Take medicines only as directed by your health care provider. °· Get regular exercise after your health care provider tells you that it is safe. °· Follow your health care provider’s recommendations on fluid and salt restrictions. °· Do not use any tobacco products, including cigarettes, chewing tobacco, or electronic cigarettes. If you need help quitting, ask your health care provider. °·   Keep all follow-up visits as directed by your health care provider. This is important. °Contact a health care provider  if: °· Your symptoms get worse. °· You have new symptoms, such as: °? Headache. °? Dizziness. °? Visual changes. °Get help right away if: °· You develop a severe or sudden headache. °· You have seizures. °· You develop numbness or weakness on one side of your body. °· You have difficulty thinking, speaking, or swallowing. °· You develop severe abdominal pain. °· You develop difficulty breathing, chest pain, a racing heartbeat, or heart palpitations. °These symptoms may represent a serious problem that is an emergency. Do not wait to see if the symptoms will go away. Get medical help right away. Call your local emergency services (911 in the [place] Do not drive yourself to the hospital. °This information is not intended to replace advice given to you by your health care provider. Make sure you discuss any questions you have with your health care provider. °Document Released: 08/15/2014 Document Revised: 05/16/2016 Document Reviewed: 06/26/2014 °Elsevier Interactive Patient Education © 2018 [employer] ° °

## 2018-11-27 NOTE — Telephone Encounter (Signed)
[facility] [place], who stated that the pt's BP is 160/110 and she is having headaches, pain and weakness.  Advised pt and home nurse that per notes on 11/21/18, Dr. Clearance CootsHarper has been adamantly calling her to come in for a BP check appointment and patient will not respond. Pt stated that she is taking Labetalol and Amlodipine as prescribed.  Ad[facility] pt [facility] and afterwards follow up in our office for a BP check. Pt stated that she has a newborn, and other small children and it is difficult, but she will try.

## 2018-11-27 NOTE — MAU Provider Note (Addendum)
History     CSN: [id number]  Arrival date and time: 11/27/18 1717   First Provider Initiated Contact with Patient 11/27/18 1903      Chief Complaint  Patient presents with  . Hypertension   Paula Dominguez is a 37 y.o. [id number] who is 16 days postpartum here today with high blood pressure, headache and "severe after pains". She states that percocet, tramadol and ibuprofen have not helped her pain. She states that she has been taking labetalol and norvasc. She believes that the labetalol is not working for her. She has CHTN, but states that she "only has high blood pressure during pregnancy". She was not on any medications prior to pregnancy. She is not breastfeeding   Hypertension  This is a new problem. The problem has been waxing and waning since onset. The problem is uncontrolled. Associated symptoms include headaches. Agents associated with hypertension include NSAIDs (pregnancy ). Past treatments include beta blockers and calcium channel blockers. There are no compliance problems.     OB History    Gravida  8   Para  7   Term  7   Preterm  0   AB  1   Living  7     SAB  1   TAB      Ectopic      Multiple  0   Live Births  7           Past Medical History:  Diagnosis Date  . Anxiety and depression   . Hx of preeclampsia, prior pregnancy, currently pregnant    prior pregnancy  . Hypertension     Past Surgical History:  Procedure Laterality Date  . HEMORRHOID SURGERY      Family History  Problem Relation Age of Onset  . Healthy Mother   . Healthy Father   . Hypertension Other   . Cancer Other     Social History   Tobacco Use  . Smoking status: Former Smoker    Packs/day: 1.00    Years: 17.00    Pack years: 17.00    Types: Cigarettes    Last attempt to quit: 07/27/2013    Years since quitting: 5.3  . Smokeless tobacco: Never Used  Substance Use Topics  . Alcohol use: No  . Drug use: No    Allergies: No Known Allergies  Medications  Prior to Admission  Medication Sig Dispense Refill Last Dose  . amLODipine (NORVASC) 10 MG tablet Take 1 tablet (10 mg total) by mouth daily. 30 tablet 3 11/27/2018 at Unknown time  . butalbital-acetaminophen-caffeine (FIORICET, ESGIC) 50-325-40 MG tablet Take 2 tablets by mouth every 8 (eight) hours as needed for headache. 30 tablet 2 11/27/2018 at Unknown time  . famotidine (PEPCID) 20 MG tablet Take 20 mg by mouth 2 (two) times daily as needed for heartburn.    Past Month at Unknown time  . ferrous fumarate (HEMOCYTE - 106 MG FE) 325 (106 Fe) MG TABS tablet Take 1 tablet (106 mg of iron total) by mouth every morning. 30 each 11 11/27/2018 at Unknown time  . ibuprofen (ADVIL,MOTRIN) 800 MG tablet Take 1 tablet (800 mg total) by mouth every 8 (eight) hours as needed. 30 tablet 5 11/27/2018 at Unknown time  . labetalol (NORMODYNE) 200 MG tablet Take 2 tablets (400 mg total) by mouth 3 (three) times daily. 90 tablet 3 11/27/2018 at Unknown time  . polyethylene glycol (MIRALAX) packet Use 1-2 packets daily for constipation. 14 each 0 11/26/2018 at Unknown  time  . Prenat-Fe Poly-Methfol-FA-DHA (VITAFOL ULTRA) 29-0.6-0.4-200 MG CAPS Take 1 capsule by mouth daily before breakfast. 90 capsule 4 11/27/2018 at Unknown time  . traMADol (ULTRAM) 50 MG tablet Take 1 tablet (50 mg total) by mouth every 6 (six) hours as needed. 30 tablet 2 Past Week at Unknown time  . ibuprofen (ADVIL,MOTRIN) 600 MG tablet Take 1 tablet (600 mg total) by mouth every 6 (six) hours as needed for moderate pain. 30 tablet 0   . nicotine (NICODERM CQ - DOSED IN MG/24 HOURS) 14 mg/24hr patch Place 1 patch (14 mg total) onto the skin daily. 28 patch 1   . oxyCODONE-acetaminophen (PERCOCET/ROXICET) 5-325 MG tablet Take 1 tablet by mouth every 4 (four) hours as needed (pain scale 4-7). 10 tablet 0     Review of Systems  Constitutional: Negative for chills and fever.  Eyes: Negative for visual disturbance.  Gastrointestinal: Negative for  nausea and vomiting.  Genitourinary: Positive for pelvic pain and vaginal bleeding. Negative for vaginal discharge.  Neurological: Positive for headaches.   Physical Exam   Blood pressure (!) 152/101, pulse 78, temperature 98.2 F (36.8 C), temperature source Oral, resp. rate 18, not currently breastfeeding.  Patient Vitals for the past 24 hrs:  BP Temp Temp src Pulse Resp  11/27/18 2117 139/81 - - 86 -  11/27/18 2100 137/80 - - 80 18  11/27/18 2059 (!) 152/88 - - 75 18  11/27/18 2052 (!) 157/100 - - 75 18  11/27/18 2050 (!) 157/100 - - 73 -  11/27/18 2030 (!) 152/99 - - 80 -  11/27/18 2020 (!) 168/95 - - 78 -  11/27/18 2000 (!) 172/104 - - 71 -  11/27/18 1945 (!) 166/99 - - 66 -  11/27/18 1930 (!) 154/95 - - 77 18  11/27/18 1915 (!) 143/93 - - 79 -  11/27/18 1900 (!) 152/101 - - 78 -  11/27/18 1853 (!) 148/88 - - 78 -  11/27/18 1749 (!) 145/91 98.2 F (36.8 C) Oral 80 18   Physical Exam  Nursing note and vitals reviewed. Constitutional: She is oriented to person, place, and time. She appears well-developed and well-nourished. No distress.  HENT:  Head: Normocephalic.  Cardiovascular: Normal rate.  Respiratory: Effort normal.  GI: Soft. There is no tenderness. There is no rebound.  Neurological: She is alert and oriented to person, place, and time.  Skin: Skin is warm and dry.  Psychiatric: She has a normal mood and affect.   Results for orders placed or performed during the hospital encounter of 11/27/18 (from the past 24 hour(s))  CBC with Differential/Platelet     Status: Abnormal   Collection Time: 11/27/18  6:30 PM  Result Value Ref Range   WBC 6.9 4.0 - 10.5 K/uL   RBC 3.57 (L) 3.87 - 5.11 MIL/uL   Hemoglobin 11.2 (L) 12.0 - 15.0 g/dL   HCT 82.9 (L) 56.2 - 13.0 %   MCV 97.5 80.0 - 100.0 fL   MCH 31.4 26.0 - 34.0 pg   MCHC 32.2 30.0 - 36.0 g/dL   RDW 86.5 78.4 - 69.6 %   Platelets 291 150 - 400 K/uL   nRBC 0.0 0.0 - 0.2 %   Neutrophils Relative % 48 %    Neutro Abs 3.3 1.7 - 7.7 K/uL   Lymphocytes Relative 46 %   Lymphs Abs 3.2 0.7 - 4.0 K/uL   Monocytes Relative 3 %   Monocytes Absolute 0.2 0.1 - 1.0 K/uL   Eosinophils  Relative 3 %   Eosinophils Absolute 0.2 0.0 - 0.5 K/uL   Basophils Relative 0 %   Basophils Absolute 0.0 0.0 - 0.1 K/uL  Comprehensive metabolic panel     Status: Abnormal   Collection Time: 11/27/18  6:30 PM  Result Value Ref Range   Sodium 140 135 - 145 mmol/L   Potassium 3.1 (L) 3.5 - 5.1 mmol/L   Chloride 106 98 - 111 mmol/L   CO2 27 22 - 32 mmol/L   Glucose, Bld 113 (H) 70 - 99 mg/dL   BUN 10 6 - 20 mg/dL   Creatinine, Ser 1.61 0.44 - 1.00 mg/dL   Calcium 8.7 (L) 8.9 - 10.3 mg/dL   Total Protein 7.0 6.5 - 8.1 g/dL   Albumin 3.6 3.5 - 5.0 g/dL   AST 22 15 - 41 U/L   ALT 14 0 - 44 U/L   Alkaline Phosphatase 60 38 - 126 U/L   Total Bilirubin 0.1 (L) 0.3 - 1.2 mg/dL   GFR calc non Af Amer >60 >60 mL/min   GFR calc Af Amer >60 >60 mL/min   Anion gap 7 5 - 15    MAU Course  Procedures  MDM 7:52 PM care turned over to [facility], CNM Thressa Sheller 7:52 PM 11/27/18   Severe range BPs at 1945 and patient still complaining of 10/10 headache. Consulted with Dr. Adrian Blackwater- will give IV hydralazine and PO labetalol and offer patient flexeril for HA. Patient agreeable to plan.   IV Hydralazine 10mg  IV Labetalol 400mg  PO Flexeril 10mg  PO  BPs normalized and patient reports relief from HA. Consulted with Dr. Adrian Blackwater- in OR, will call back. Patient demanding to be discharged now. Will not wait for CNM to review home medications with MD.  Assessment and Plan   1. Essential hypertension-postpartum    -Discharge home in stable condition -Rx for HCTZ, flexeril, potassium and toradol given to patient -BP precautions discussed -Patient advised to follow-up with [facility] for a BP check Friday. -Patient may return to MAU as needed or if her condition were to change or worsen  Rolm Bookbinder,  CNM 11/27/18 9:38 PM

## 2018-11-27 NOTE — MAU Note (Signed)
Vag del 11/16. Was dc'd on BP medication. Has been having pain in lower abd and pelvic bones since delivery.  Having headaches. Denies visual changes.

## 2018-11-30 ENCOUNTER — Ambulatory Visit: Payer: BLUE CROSS/BLUE SHIELD

## 2018-11-30 VITALS — BP 150/106 | HR 89 | Wt 205.2 lb

## 2018-11-30 DIAGNOSIS — O10919 Unspecified pre-existing hypertension complicating pregnancy, unspecified trimester: Secondary | ICD-10-CM

## 2018-11-30 MED ORDER — LISINOPRIL 10 MG PO TABS: 10.0000 mg | ORAL_TABLET | Freq: Every day | ORAL | 1 refills | Status: DC

## 2018-11-30 NOTE — Progress Notes (Signed)
Pt is here for BP check. States she is taking all her BP meds and toradol and it is helping her feel better.  Change labetalol to lisinopril 10 mg

## 2018-12-03 ENCOUNTER — Ambulatory Visit: Payer: BLUE CROSS/BLUE SHIELD | Admitting: *Deleted

## 2018-12-03 VITALS — BP 123/88 | HR 93 | Wt 198.0 lb

## 2018-12-03 DIAGNOSIS — O1003 Pre-existing essential hypertension complicating the puerperium: Secondary | ICD-10-CM

## 2018-12-03 NOTE — Progress Notes (Signed)
I have reviewed the chart and agree with nursing staff's documentation of this patient's encounter.  Paula AntiguaPeggy Harla Mensch, MD 12/03/2018 3:09 PM

## 2018-12-03 NOTE — Progress Notes (Signed)
Subjective:  Paula Dominguez is a 37 y.o. female here for BP check.  Pt states she would like Rx for anxiety.  Hypertension ROS: taking medications as instructed, no medication side effects noted, no TIA's, no chest pain on exertion, no dyspnea on exertion and no swelling of ankles.    Objective:  BP 123/88   Pulse 93   Wt 198 lb (89.8 kg)   BMI 31.96 kg/m   Appearance alert, well appearing, and in no distress.  General exam BP noted to be well controlled today in office.    Assessment:   Blood Pressure improved. Reviewed with Dr Jolayne Pantheronstant, anxiety Rx denied at this time. Pt made aware she may see Asher MuirJamie with [facility].    Plan:  Current treatment plan is effective, no change in therapy.. [facility]

## 2018-12-12 ENCOUNTER — Ambulatory Visit (INDEPENDENT_AMBULATORY_CARE_PROVIDER_SITE_OTHER): Payer: BLUE CROSS/BLUE SHIELD | Admitting: Obstetrics

## 2018-12-12 ENCOUNTER — Encounter: Payer: Self-pay | Admitting: Obstetrics

## 2018-12-12 DIAGNOSIS — Z30013 Encounter for initial prescription of injectable contraceptive: Secondary | ICD-10-CM

## 2018-12-12 DIAGNOSIS — O10919 Unspecified pre-existing hypertension complicating pregnancy, unspecified trimester: Secondary | ICD-10-CM

## 2018-12-12 DIAGNOSIS — F419 Anxiety disorder, unspecified: Secondary | ICD-10-CM

## 2018-12-12 DIAGNOSIS — Z3009 Encounter for other general counseling and advice on contraception: Secondary | ICD-10-CM

## 2018-12-12 DIAGNOSIS — Z3202 Encounter for pregnancy test, result negative: Secondary | ICD-10-CM

## 2018-12-12 LAB — POCT URINE PREGNANCY: PREG TEST UR: NEGATIVE

## 2018-12-12 MED ORDER — MEDROXYPROGESTERONE ACETATE 150 MG/ML IM SUSP
150.0000 mg | Freq: Once | INTRAMUSCULAR | Status: AC
  Administered 2018-12-12: 150 mg via INTRAMUSCULAR

## 2018-12-12 NOTE — Progress Notes (Signed)
Post Partum Exam  Paula Dominguez is a 37 y.o. [phone] female who presents for a postpartum visit. She is 6 weeks postpartum following a spontaneous vaginal delivery. I have fully reviewed the prenatal and intrapartum course. The delivery was at  [redacted] weeks gestational weeks.  Anesthesia: epidural. Postpartum course has been c/o anxiety. Baby's course has been unremarkable. Baby is feeding by bottle - Similac Neosure . Bleeding "slimy clotting.". Bowel function is normal. Bladder function is normal. Patient is sexually active. Contraception method is none. Postpartum depression screening:neg  The following portions of the patient's history were reviewed and updated as appropriate: allergies, current medications, past family history, past medical history, past social history, past surgical history and problem list. Last pap smear done 05-2018 and was Normal  Review of Systems A comprehensive review of systems was negative except for: Behavioral/Psych: positive for anxiety    Objective:  Blood pressure 120/89, pulse 81, weight 197 lb 9.6 oz (89.6 kg), not currently breastfeeding.   PE:  Deferred   Assessment:   1. Postpartum care following vaginal delivery  2. Chronic hypertension during pregnancy, antepartum - stable  3. Anxiety - improving  4. Encounter for other general counseling and advice on contraception Rx: - POCT urine pregnancy  5. Encounter for initial prescription of injectable contraceptive Rx: - medroxyPROGESTERone (DEPO-PROVERA) injection 150 mg   Plan:   1. Contraception: Depo-Provera injections 2. Continue PNV's 3. Follow up in: 6 months or as needed.   Brock BadHARLES A. HARPER MD 12-11-2018

## 2018-12-18 ENCOUNTER — Other Ambulatory Visit: Payer: Self-pay

## 2018-12-24 DIAGNOSIS — F4001 Agoraphobia with panic disorder: Secondary | ICD-10-CM | POA: Diagnosis not present

## 2018-12-24 DIAGNOSIS — I10 Essential (primary) hypertension: Secondary | ICD-10-CM | POA: Diagnosis not present

## 2018-12-24 DIAGNOSIS — Z0000 Encounter for general adult medical examination without abnormal findings: Secondary | ICD-10-CM | POA: Diagnosis not present

## 2018-12-24 DIAGNOSIS — E876 Hypokalemia: Secondary | ICD-10-CM | POA: Diagnosis not present

## 2018-12-25 DIAGNOSIS — F17210 Nicotine dependence, cigarettes, uncomplicated: Secondary | ICD-10-CM | POA: Diagnosis not present

## 2018-12-25 DIAGNOSIS — J45909 Unspecified asthma, uncomplicated: Secondary | ICD-10-CM | POA: Diagnosis not present

## 2018-12-25 DIAGNOSIS — Z79899 Other long term (current) drug therapy: Secondary | ICD-10-CM | POA: Diagnosis not present

## 2018-12-25 DIAGNOSIS — J111 Influenza due to unidentified influenza virus with other respiratory manifestations: Secondary | ICD-10-CM | POA: Diagnosis not present

## 2019-01-08 DIAGNOSIS — F321 Major depressive disorder, single episode, moderate: Secondary | ICD-10-CM | POA: Diagnosis not present

## 2019-01-08 DIAGNOSIS — I10 Essential (primary) hypertension: Secondary | ICD-10-CM | POA: Diagnosis not present

## 2019-01-08 DIAGNOSIS — G4701 Insomnia due to medical condition: Secondary | ICD-10-CM | POA: Insufficient documentation

## 2019-02-04 DIAGNOSIS — I10 Essential (primary) hypertension: Secondary | ICD-10-CM | POA: Diagnosis not present

## 2019-02-04 DIAGNOSIS — F321 Major depressive disorder, single episode, moderate: Secondary | ICD-10-CM | POA: Diagnosis not present

## 2019-02-04 DIAGNOSIS — F4001 Agoraphobia with panic disorder: Secondary | ICD-10-CM | POA: Diagnosis not present

## 2019-04-08 DIAGNOSIS — F4001 Agoraphobia with panic disorder: Secondary | ICD-10-CM | POA: Diagnosis not present

## 2019-04-08 DIAGNOSIS — G4701 Insomnia due to medical condition: Secondary | ICD-10-CM | POA: Diagnosis not present

## 2019-04-08 DIAGNOSIS — F321 Major depressive disorder, single episode, moderate: Secondary | ICD-10-CM | POA: Diagnosis not present

## 2019-04-08 DIAGNOSIS — I10 Essential (primary) hypertension: Secondary | ICD-10-CM | POA: Diagnosis not present

## 2019-05-14 DIAGNOSIS — F4001 Agoraphobia with panic disorder: Secondary | ICD-10-CM | POA: Diagnosis not present

## 2019-05-14 DIAGNOSIS — F321 Major depressive disorder, single episode, moderate: Secondary | ICD-10-CM | POA: Diagnosis not present

## 2019-05-14 DIAGNOSIS — F3160 Bipolar disorder, current episode mixed, unspecified: Secondary | ICD-10-CM | POA: Diagnosis not present

## 2019-05-14 DIAGNOSIS — G4701 Insomnia due to medical condition: Secondary | ICD-10-CM | POA: Diagnosis not present

## 2019-05-14 DIAGNOSIS — Z30013 Encounter for initial prescription of injectable contraceptive: Secondary | ICD-10-CM | POA: Diagnosis not present

## 2019-07-18 DIAGNOSIS — G4701 Insomnia due to medical condition: Secondary | ICD-10-CM | POA: Diagnosis not present

## 2019-07-18 DIAGNOSIS — I10 Essential (primary) hypertension: Secondary | ICD-10-CM | POA: Diagnosis not present

## 2019-07-18 DIAGNOSIS — F321 Major depressive disorder, single episode, moderate: Secondary | ICD-10-CM | POA: Diagnosis not present

## 2019-07-18 DIAGNOSIS — F4001 Agoraphobia with panic disorder: Secondary | ICD-10-CM | POA: Diagnosis not present

## 2019-08-15 DIAGNOSIS — F4001 Agoraphobia with panic disorder: Secondary | ICD-10-CM | POA: Diagnosis not present

## 2019-08-15 DIAGNOSIS — F3160 Bipolar disorder, current episode mixed, unspecified: Secondary | ICD-10-CM | POA: Diagnosis not present

## 2019-08-15 DIAGNOSIS — F321 Major depressive disorder, single episode, moderate: Secondary | ICD-10-CM | POA: Diagnosis not present

## 2019-08-15 DIAGNOSIS — K219 Gastro-esophageal reflux disease without esophagitis: Secondary | ICD-10-CM | POA: Diagnosis not present

## 2019-09-19 DIAGNOSIS — F3160 Bipolar disorder, current episode mixed, unspecified: Secondary | ICD-10-CM | POA: Diagnosis not present

## 2019-09-19 DIAGNOSIS — F321 Major depressive disorder, single episode, moderate: Secondary | ICD-10-CM | POA: Diagnosis not present

## 2019-09-19 DIAGNOSIS — F4001 Agoraphobia with panic disorder: Secondary | ICD-10-CM | POA: Diagnosis not present

## 2019-09-25 DIAGNOSIS — Z20828 Contact with and (suspected) exposure to other viral communicable diseases: Secondary | ICD-10-CM | POA: Diagnosis not present

## 2019-09-25 DIAGNOSIS — J40 Bronchitis, not specified as acute or chronic: Secondary | ICD-10-CM | POA: Diagnosis not present

## 2019-09-25 DIAGNOSIS — J0100 Acute maxillary sinusitis, unspecified: Secondary | ICD-10-CM | POA: Diagnosis not present

## 2019-09-25 DIAGNOSIS — F17200 Nicotine dependence, unspecified, uncomplicated: Secondary | ICD-10-CM | POA: Diagnosis not present

## 2019-12-08 IMAGING — US US MFM FETAL BPP W/O NON-STRESS
1 series · 12 of 17 positions shown · non-contrast
Comparison: none

[Series 1: us mfm fetal bpp w/o non-stress · 17 acquisitions, 12 frames shown]
[im 1/17]
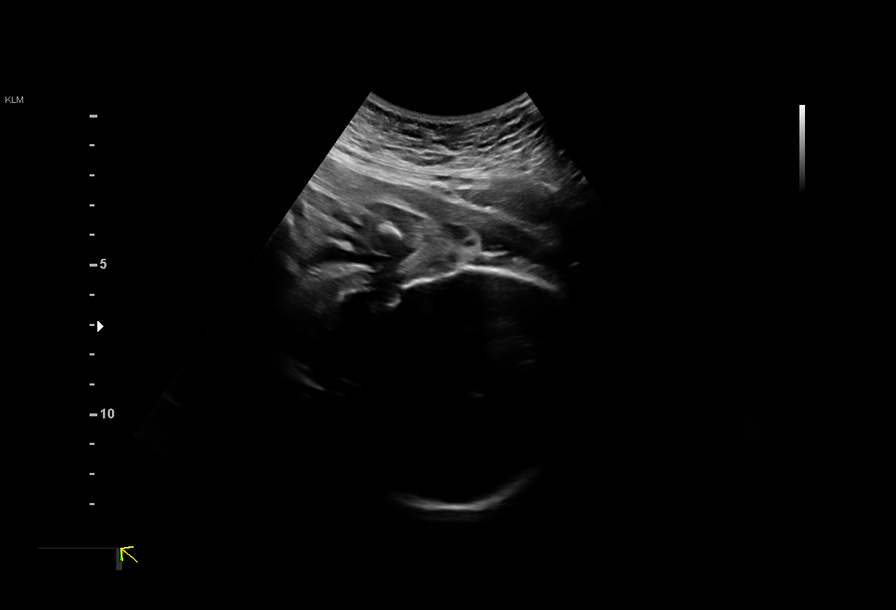
[im 3/17]
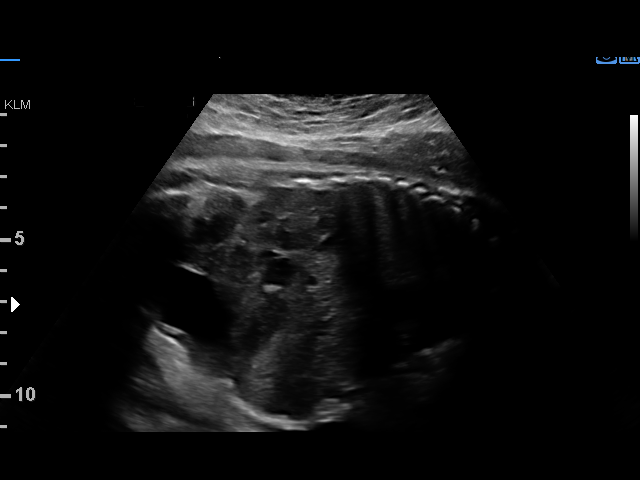
[im 4/17]
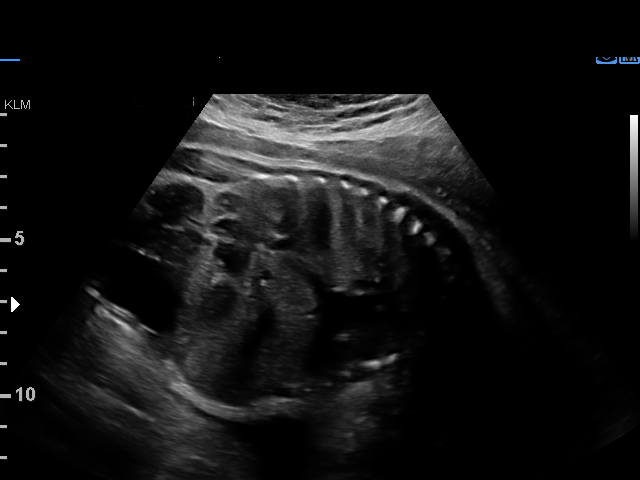
[im 5/17]
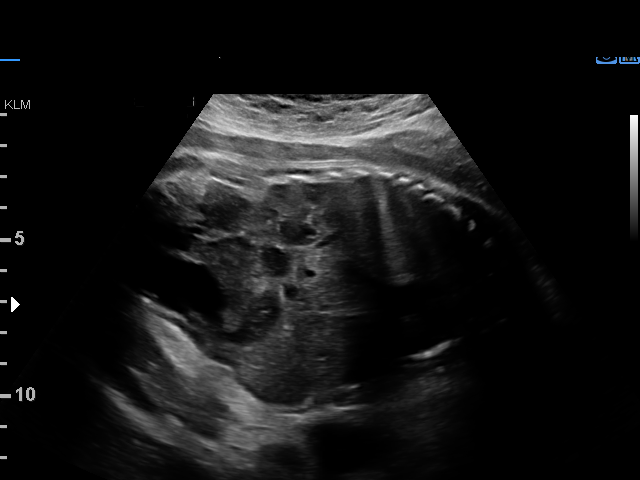
[im 7/17]
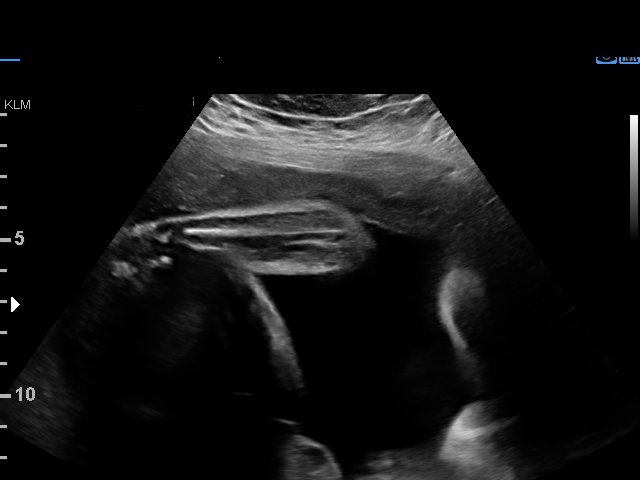
[im 8/17]
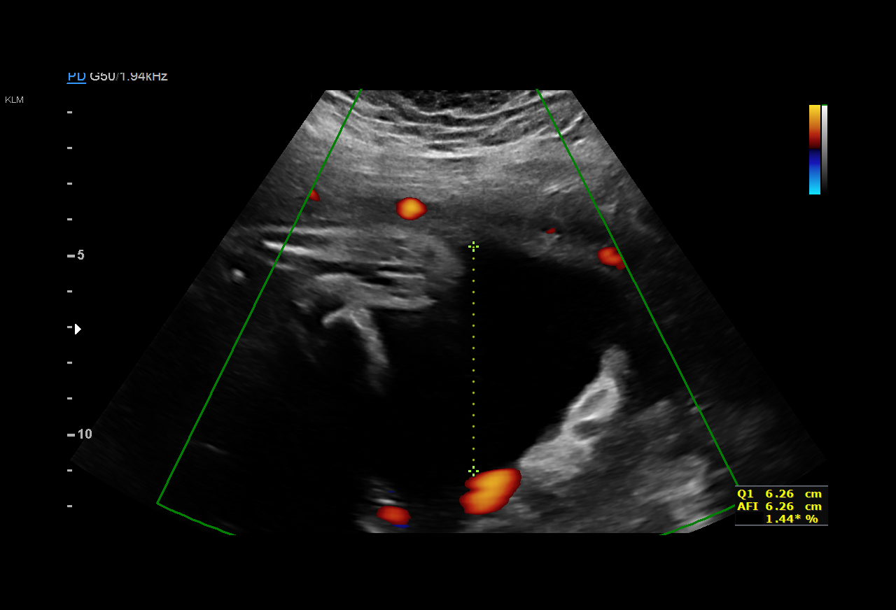
[im 10/17]
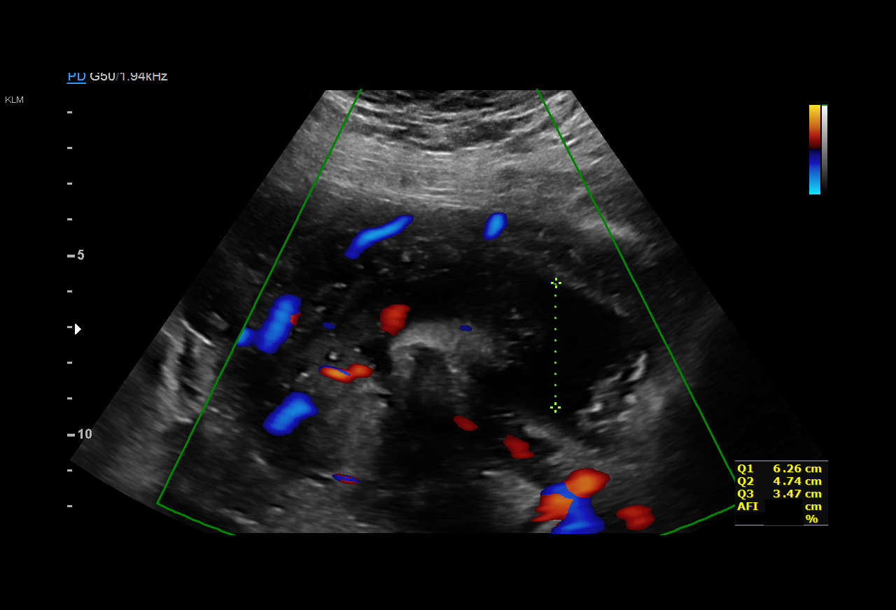
[im 11/17]
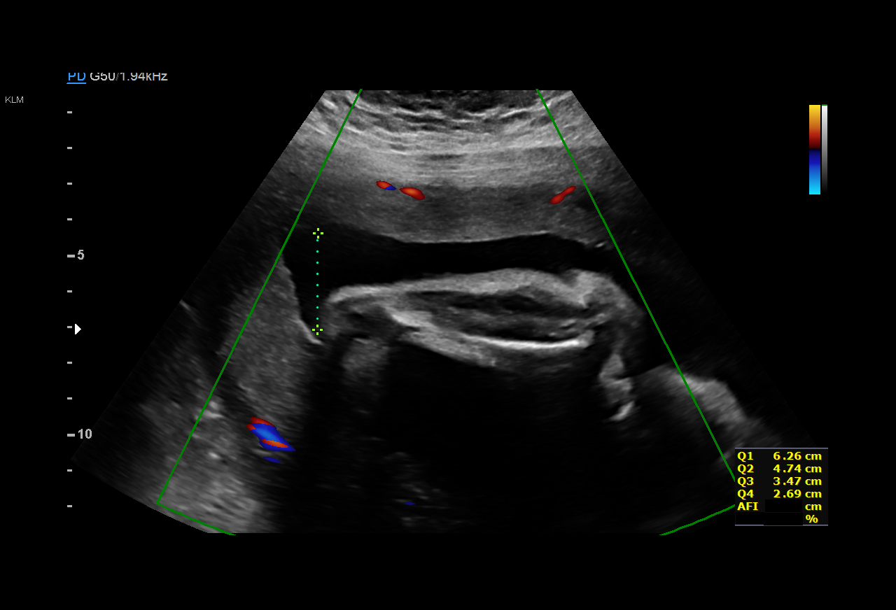
[im 13/17]
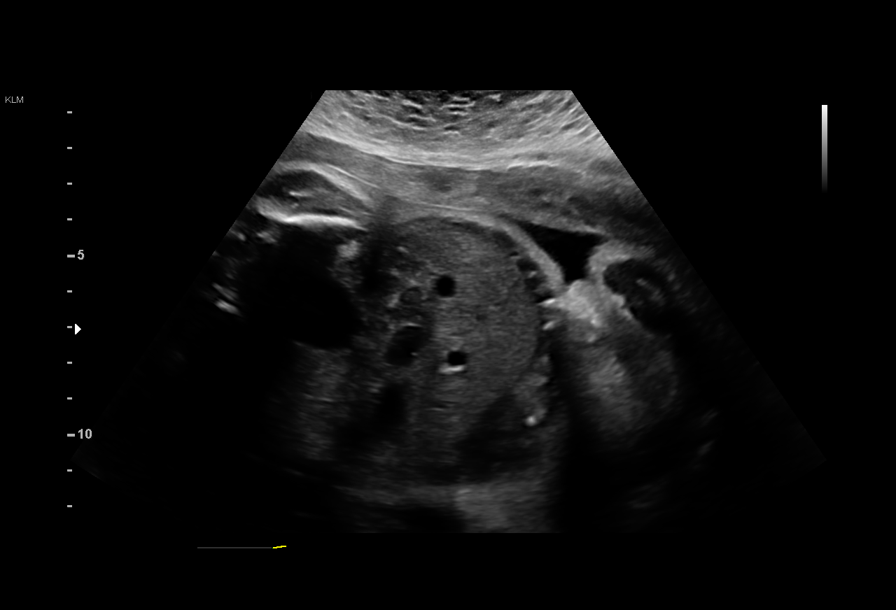
[im 14/17]
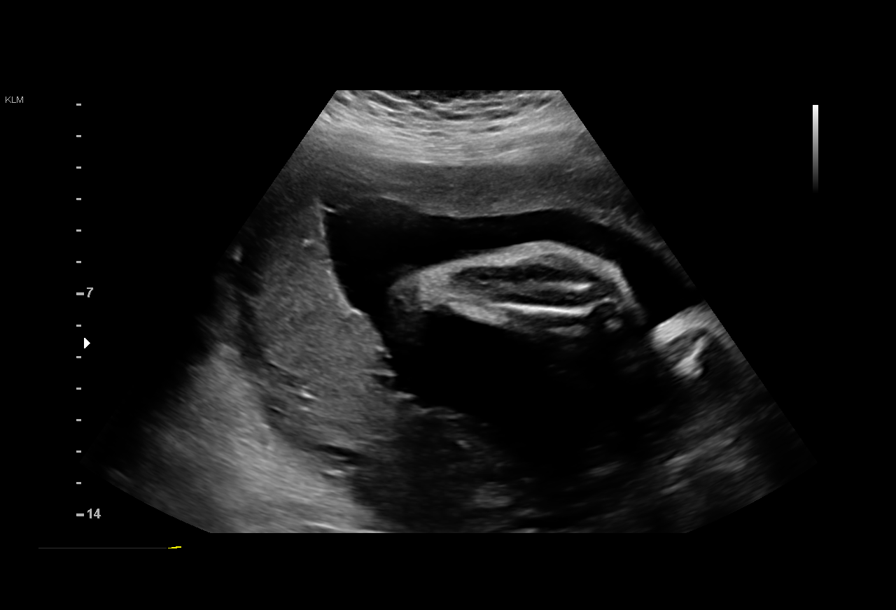
[im 15/17]
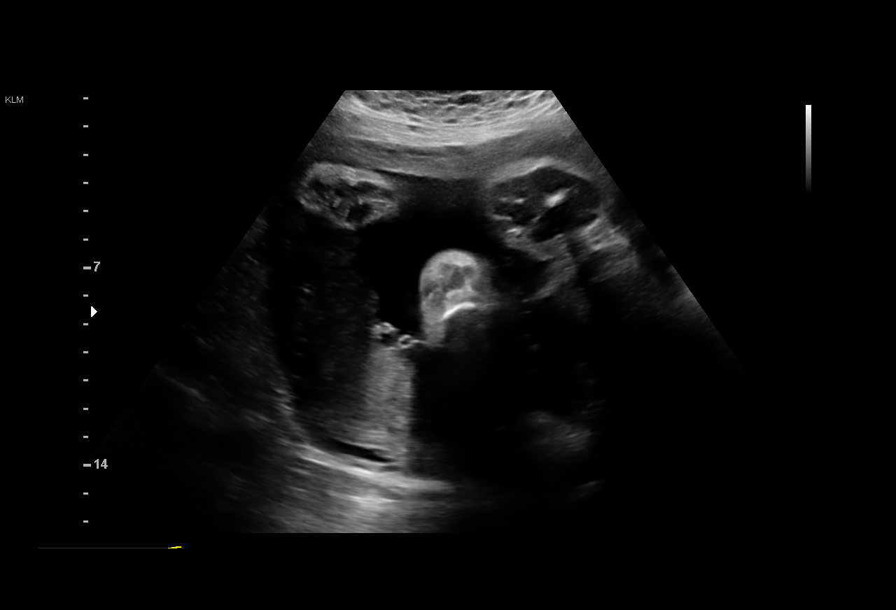
[im 17/17]
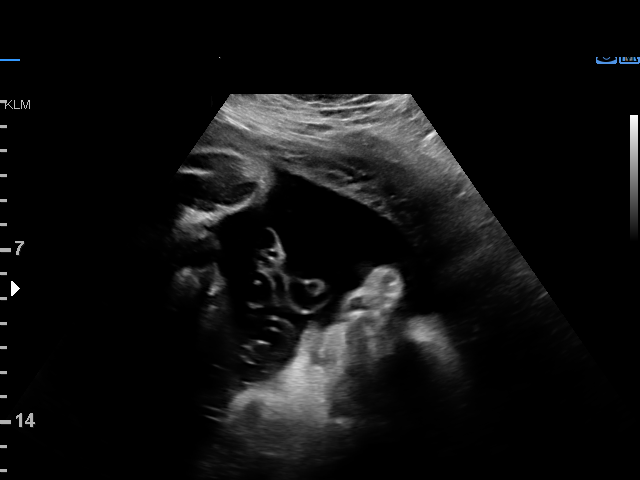

[12 of 17 positions shown; findings below may reference images not displayed]

[REDACTED]care - [HOSPITAL]

Indications

Hypertension - Chronic/Pre-existing
(Norvasc)
33 weeks gestation of pregnancy
Advanced maternal age multigravida 35+,
third trimester (low risk Panorama)
Vital Signs

Height:        5'6"
Fetal Evaluation

Num Of Fetuses:         1
Fetal Heart Rate(bpm):  149
Cardiac Activity:       Observed
Presentation:           Cephalic

Amniotic Fluid
AFI FV:      Within normal limits

AFI Sum(cm)     %Tile       Largest Pocket(cm)
17.16           63

RUQ(cm)       RLQ(cm)       LUQ(cm)        LLQ(cm)
6.26

Comment:    [DATE] BPP in 5 minutes.
Biophysical Evaluation
Amniotic F.V:   Within normal limits       F. Tone:        Observed
F. Movement:    Observed                   Score:          [DATE]
F. Breathing:   Observed
Gestational Age

Best:          33w 2d     Det. By:  U/S  (06/15/18)          EDD:   12/01/18
Impression

Patient with chronic hypertension returned for antenatal
testing. She reports having mild headache and visual flashes.
She also gives history of epistaxis. Patient has good fetal
movements and does not have right upper quadrant pain or
vaginal bleeding.

Her medications include Norvasc 5 mg and labetalol 200 mg
bid (since 10/11/18).
BP at our office 149/97 mm Hg.

On ultrasound, amniotic fluid is normal and good fetal activity
is seen. Cephalic presentation. Antenatal testing is
reassuring. BPP [DATE].

I discussed her symptoms and the possibility of
superimposed preeclampsia. I recommended evaluation in
the [facility] (lab work and monitoring BP). Patient reports she
has other commitments and will return if her symptoms
worsen. She has an appointment at your office in 3 days.
Recommendations

-Continue weekly antenatal testing.
-Blood work and urinalysis from your office.

## 2019-12-18 DIAGNOSIS — F4001 Agoraphobia with panic disorder: Secondary | ICD-10-CM | POA: Diagnosis not present

## 2019-12-18 DIAGNOSIS — F321 Major depressive disorder, single episode, moderate: Secondary | ICD-10-CM | POA: Diagnosis not present

## 2019-12-18 DIAGNOSIS — K219 Gastro-esophageal reflux disease without esophagitis: Secondary | ICD-10-CM | POA: Diagnosis not present

## 2019-12-18 DIAGNOSIS — F3160 Bipolar disorder, current episode mixed, unspecified: Secondary | ICD-10-CM | POA: Diagnosis not present

## 2020-09-14 ENCOUNTER — Ambulatory Visit: Payer: BLUE CROSS/BLUE SHIELD | Admitting: Family

## 2020-09-14 DIAGNOSIS — Z0289 Encounter for other administrative examinations: Secondary | ICD-10-CM

## 2020-12-26 NOTE — L&D Delivery Note (Signed)
[facility] Delivery Note  Paula Dominguez is a 40 y.o. [id number] s/p vag del at [redacted]w[redacted]d. She was admitted for IOL due to cHTN with severe SIPE.   ROM: 0h 30m with clear/portwine stained fluid GBS Status: unknown (PCN x 2 doses) Maximum Maternal Temperature: 98.8  Labor Progress: [name] was admitted to L&D on the evening of 7/13 due to severe range BPs. Mag sulfate was started. She had a foley placement and a dose of vag cytotec. Once the foley dislodged, Pit was started, she had AROM and then vag del after pushing with 1 ctx.  Delivery Date/Time: July 14th, 2022 at 0421 Delivery: Called to room and patient was complete and pushing. Head delivered ROP. No nuchal cord present. Shoulder and body delivered in usual fashion. Infant with spontaneous cry, placed on mother's abdomen, dried and stimulated. Cord clamped x 2 after 1-minute delay, and cut by FOB. Cord blood drawn. Placenta delivered spontaneously with gentle cord traction. Fundus firm with massage and Pitocin. Labia, perineum, vagina, and cervix inspected and found to be intact. TXA was given as PPH ppx due to grandmultip on mag sulfate.  Placenta: spont, intact> to path Complications: none Lacerations: none EBL: 300cc Analgesia: epidural  Postpartum Planning [x]  message to sent to schedule follow-up   Infant: girl  APGARs 9/9  2405g (5lb 4.8oz)  11/9, CNM  07/08/2021 5:00 AM

## 2021-02-11 ENCOUNTER — Inpatient Hospital Stay (HOSPITAL_BASED_OUTPATIENT_CLINIC_OR_DEPARTMENT_OTHER): Payer: 59

## 2021-02-11 ENCOUNTER — Other Ambulatory Visit: Payer: Self-pay

## 2021-02-11 ENCOUNTER — Encounter (HOSPITAL_COMMUNITY): Payer: Self-pay | Admitting: Emergency Medicine

## 2021-02-11 ENCOUNTER — Inpatient Hospital Stay (HOSPITAL_COMMUNITY)
Admission: EM | Admit: 2021-02-11 | Discharge: 2021-02-11 | Disposition: A | Discharge status: Home / Self Care | Payer: 59 | Attending: Emergency Medicine | Admitting: Emergency Medicine

## 2021-02-11 ENCOUNTER — Ambulatory Visit: Payer: 59

## 2021-02-11 DIAGNOSIS — O132 Gestational [pregnancy-induced] hypertension without significant proteinuria, second trimester: Secondary | ICD-10-CM | POA: Diagnosis not present

## 2021-02-11 DIAGNOSIS — O0932 Supervision of pregnancy with insufficient antenatal care, second trimester: Secondary | ICD-10-CM | POA: Diagnosis not present

## 2021-02-11 DIAGNOSIS — Z79899 Other long term (current) drug therapy: Secondary | ICD-10-CM | POA: Diagnosis not present

## 2021-02-11 DIAGNOSIS — Z3A15 15 weeks gestation of pregnancy: Secondary | ICD-10-CM | POA: Diagnosis not present

## 2021-02-11 DIAGNOSIS — O09522 Supervision of elderly multigravida, second trimester: Secondary | ICD-10-CM

## 2021-02-11 DIAGNOSIS — O219 Vomiting of pregnancy, unspecified: Secondary | ICD-10-CM

## 2021-02-11 DIAGNOSIS — O26852 Spotting complicating pregnancy, second trimester: Secondary | ICD-10-CM | POA: Diagnosis not present

## 2021-02-11 DIAGNOSIS — O4692 Antepartum hemorrhage, unspecified, second trimester: Secondary | ICD-10-CM | POA: Diagnosis not present

## 2021-02-11 DIAGNOSIS — O0990 Supervision of high risk pregnancy, unspecified, unspecified trimester: Secondary | ICD-10-CM | POA: Insufficient documentation

## 2021-02-11 DIAGNOSIS — Z87891 Personal history of nicotine dependence: Secondary | ICD-10-CM | POA: Insufficient documentation

## 2021-02-11 DIAGNOSIS — O10919 Unspecified pre-existing hypertension complicating pregnancy, unspecified trimester: Secondary | ICD-10-CM | POA: Diagnosis present

## 2021-02-11 DIAGNOSIS — O10012 Pre-existing essential hypertension complicating pregnancy, second trimester: Secondary | ICD-10-CM | POA: Diagnosis not present

## 2021-02-11 DIAGNOSIS — E860 Dehydration: Secondary | ICD-10-CM

## 2021-02-11 DIAGNOSIS — O208 Other hemorrhage in early pregnancy: Secondary | ICD-10-CM | POA: Diagnosis not present

## 2021-02-11 DIAGNOSIS — O09892 Supervision of other high risk pregnancies, second trimester: Secondary | ICD-10-CM | POA: Diagnosis not present

## 2021-02-11 DIAGNOSIS — Z3A14 14 weeks gestation of pregnancy: Secondary | ICD-10-CM

## 2021-02-11 HISTORY — DX: Encounter for other specified aftercare: Z51.89

## 2021-02-11 HISTORY — DX: Gastro-esophageal reflux disease without esophagitis: K21.9

## 2021-02-11 HISTORY — DX: Anemia, unspecified: D64.9

## 2021-02-11 LAB — URINALYSIS, ROUTINE W REFLEX MICROSCOPIC
Bilirubin Urine: NEGATIVE
Glucose, UA: NEGATIVE mg/dL
Hgb urine dipstick: NEGATIVE
Ketones, ur: NEGATIVE mg/dL
Nitrite: NEGATIVE
Protein, ur: NEGATIVE mg/dL
Specific Gravity, Urine: 1.028 (ref 1.005–1.030)
pH: 5 (ref 5.0–8.0)

## 2021-02-11 LAB — COMPREHENSIVE METABOLIC PANEL
ALT: 11 U/L (ref 0–44)
AST: 15 U/L (ref 15–41)
Albumin: 3.3 g/dL — ABNORMAL LOW (ref 3.5–5.0)
Alkaline Phosphatase: 41 U/L (ref 38–126)
Anion gap: 9 (ref 5–15)
BUN: 7 mg/dL (ref 6–20)
CO2: 22 mmol/L (ref 22–32)
Calcium: 9.3 mg/dL (ref 8.9–10.3)
Chloride: 106 mmol/L (ref 98–111)
Creatinine, Ser: 0.56 mg/dL (ref 0.44–1.00)
GFR, Estimated: >60 mL/min (ref >60)
Glucose, Bld: 96 mg/dL (ref 70–99)
Potassium: 3.4 mmol/L — ABNORMAL LOW (ref 3.5–5.1)
Sodium: 137 mmol/L (ref 135–145)
Total Bilirubin: 0.3 mg/dL (ref 0.3–1.2)
Total Protein: 6.6 g/dL (ref 6.5–8.1)

## 2021-02-11 LAB — CBC
HCT: 35.1 % — ABNORMAL LOW (ref 36.0–46.0)
Hemoglobin: 11.4 g/dL — ABNORMAL LOW (ref 12.0–15.0)
MCH: 30.7 pg (ref 26.0–34.0)
MCHC: 32.5 g/dL (ref 30.0–36.0)
MCV: 94.6 fL (ref 80.0–100.0)
Platelets: 251 10*3/uL (ref 150–400)
RBC: 3.71 MIL/uL — ABNORMAL LOW (ref 3.87–5.11)
RDW: 13.7 % (ref 11.5–15.5)
WBC: 11.2 10*3/uL — ABNORMAL HIGH (ref 4.0–10.5)
nRBC: 0 % (ref 0.0–0.2)

## 2021-02-11 LAB — WET PREP, GENITAL
Sperm: NONE SEEN
Trich, Wet Prep: NONE SEEN
Yeast Wet Prep HPF POC: NONE SEEN

## 2021-02-11 LAB — PROTEIN / CREATININE RATIO, URINE
Creatinine, Urine: 212.59 mg/dL
Protein Creatinine Ratio: 0.07 mg/mg{Cre} (ref 0.00–0.15)
Total Protein, Urine: 14 mg/dL

## 2021-02-11 MED ORDER — PROMETHAZINE HCL 25 MG/ML IJ SOLN
12.5000 mg | Freq: Once | INTRAMUSCULAR | Status: AC
  Administered 2021-02-11: 12.5 mg via INTRAVENOUS
  Filled 2021-02-11: qty 1

## 2021-02-11 MED ORDER — ONDANSETRON 4 MG PO TBDP: 4.0000 mg | ORAL_TABLET | Freq: Four times a day (QID) | ORAL | 0 refills | Status: DC | PRN

## 2021-02-11 MED ORDER — ASPIRIN 81 MG PO CHEW: 81.0000 mg | CHEWABLE_TABLET | Freq: Every day | ORAL | 5 refills | Status: DC

## 2021-02-11 MED ORDER — FAMOTIDINE IN NACL 20-0.9 MG/50ML-% IV SOLN
20.0000 mg | Freq: Once | INTRAVENOUS | Status: AC
  Administered 2021-02-11: 20 mg via INTRAVENOUS
  Filled 2021-02-11: qty 50

## 2021-02-11 MED ORDER — PROMETHAZINE HCL 25 MG PO TABS: 25.0000 mg | ORAL_TABLET | Freq: Four times a day (QID) | ORAL | 2 refills | Status: DC | PRN

## 2021-02-11 MED ORDER — LACTATED RINGERS IV SOLN: Freq: Once | INTRAVENOUS | Status: AC

## 2021-02-11 MED ORDER — OMEPRAZOLE 40 MG PO CPDR: 40.0000 mg | DELAYED_RELEASE_CAPSULE | Freq: Every day | ORAL | 6 refills | Status: DC

## 2021-02-11 NOTE — MAU Note (Signed)
Sent from office for eval for elevated b/p and  Headache.  Pt  Reports heache is constant. Takes tylenol and her b/p meds but they are coming back up due to constant n/v. No headche at this time. Also reports frequent nose bleeds. Not currently on mediation for nausea.

## 2021-02-11 NOTE — Addendum Note (Signed)
Addended by: Charlsie Quest B on: 02/11/2021 02:37 PM   Modules accepted: Orders

## 2021-02-11 NOTE — MAU Provider Note (Signed)
Chief Complaint:  Headache and Hypertension   Event Date/Time   First Provider Initiated Contact with Patient 02/11/21 2000     HPI: Paula Dominguez is a 40 y.o. [id number] at 2w6dwho presents to maternity admissions reporting headache (now improved, almost resolved), hypertension and nausea/vomiting.  Also tells me that she started bright red bleeding Tuesday and Wednesday.  Has not had care yet, scheduled for new OB soon. . She denies LOF, current vaginal bleeding, vaginal itching/burning, urinary symptoms, h/a, dizziness, diarrhea, constipation or fever/chills.  She denies headache, visual changes or RUQ abdominal pain.  Headache  This is a new problem. The current episode started in the past 7 days. The problem occurs intermittently. The problem has been resolved. The quality of the pain is described as aching. Associated symptoms include abdominal pain. Pertinent negatives include no blurred vision, coughing, dizziness, fever, nausea, neck pain, photophobia, sinus pressure or visual change. Nothing aggravates the symptoms. She has tried acetaminophen for the symptoms. The treatment provided significant relief. Her past medical history is significant for hypertension.  Hypertension This is a chronic problem. The current episode started more than 1 year ago. The problem has been waxing and waning since onset. Associated symptoms include headaches and malaise/fatigue. Pertinent negatives include no blurred vision, neck pain, peripheral edema or shortness of breath. There are no associated agents to hypertension. Past treatments include beta blockers. The current treatment provides significant improvement. There are no compliance problems.     RN Note: Sent from office for eval for elevated b/p and  Headache.  Pt  Reports heache is constant. Takes tylenol and her b/p meds but they are coming back up due to constant n/v. No headche at this time. Also reports frequent nose bleeds. Not currently on  mediation for nausea.  Past Medical History: Past Medical History:  Diagnosis Date  . Anemia   . Anxiety and depression   . Blood transfusion without reported diagnosis   . GERD (gastroesophageal reflux disease)   . Hx of preeclampsia, prior pregnancy, currently pregnant    prior pregnancy  . Hypertension     Past obstetric history: OB History  Gravida Para Term Preterm AB Living  9 7 7  0 1 7  SAB IAB Ectopic Multiple Live Births  1     0 7    # Outcome Date GA Lbr Len/2nd Weight Sex Delivery Anes PTL Lv  9 Current           8 Term 11/10/18 [redacted]w[redacted]d 08:49 / 00:03 2625 g F Vag-Spont EPI  LIV  7 Term 11/20/13 [redacted]w[redacted]d 06:10 / 00:12 2920 g M Vag-Spont EPI, Local  LIV  6 Term 12/23/10 [redacted]w[redacted]d  2722 g M Vag-Spont EPI  LIV     Birth Comments: preeclampsia  5 Term 03/29/06 [redacted]w[redacted]d  3175 g F Vag-Spont   LIV  4 SAB 2006 [redacted]w[redacted]d         3 Term 10/22/02 [redacted]w[redacted]d  3175 g M Vag-Spont   LIV  2 Term 08/10/98 [redacted]w[redacted]d  3175 g F Vag-Spont EPI  LIV  1 Term 03/23/95 [redacted]w[redacted]d  3175 g M Vag-Spont EPI  LIV    Past Surgical History: Past Surgical History:  Procedure Laterality Date  . HEMORRHOID SURGERY      Family History: Family History  Problem Relation Age of Onset  . Healthy Mother   . Healthy Father   . Hypertension Other   . Cancer Other     Social History: Social History   Tobacco Use  .  Smoking status: Former Smoker    Packs/day: 1.00    Years: 17.00    Pack years: 17.00    Types: Cigarettes    Quit date: 07/27/2013    Years since quitting: 7.5  . Smokeless tobacco: Never Used  Vaping Use  . Vaping Use: Former  Substance Use Topics  . Alcohol use: No  . Drug use: No    Allergies: No Known Allergies  Meds:  Medications Prior to Admission  Medication Sig Dispense Refill Last Dose  . labetalol (NORMODYNE) 100 MG tablet Take 1 tablet by mouth 2 (two) times daily.   02/11/2021 at Unknown time  . omeprazole (PRILOSEC) 40 MG capsule Take 1 capsule (40 mg total) by mouth daily. 30 capsule  6 02/11/2021 at Unknown time  . Prenat-FeBis-FePro-FA-CA-Omega (PR NATAL 400 EC) 29-1-200 & 400 MG (DR) MISC Take 1 tablet by mouth daily.   02/11/2021 at Unknown time  . famotidine (PEPCID) 40 MG tablet Take 1 tablet by mouth daily. (Patient not taking: Reported on 02/11/2021)   Not Taking at Unknown time  . promethazine (PHENERGAN) 25 MG tablet Take 1 tablet (25 mg total) by mouth every 6 (six) hours as needed for nausea or vomiting. (Patient not taking: Reported on 02/11/2021) 30 tablet 2 Not Taking at Unknown time    I have reviewed patient's Past Medical Hx, Surgical Hx, Family Hx, Social Hx, medications and allergies.   ROS:  Review of Systems  Constitutional: Positive for malaise/fatigue. Negative for fever.  HENT: Negative for sinus pressure.   Eyes: Negative for blurred vision and photophobia.  Respiratory: Negative for cough and shortness of breath.   Gastrointestinal: Positive for abdominal pain. Negative for nausea.  Musculoskeletal: Negative for neck pain.  Neurological: Positive for headaches. Negative for dizziness.   Other systems negative  Physical Exam   Patient Vitals for the past 24 hrs:  BP Temp Temp src Pulse Resp SpO2 Height Weight  02/11/21 1940 121/82 -- -- 79 -- -- -- --  02/11/21 1855 125/72 -- -- 83 18 -- 5\' 6"  (1.676 m) 91.2 kg  02/11/21 1711 (!) 139/94 98.5 F (36.9 C) Oral 88 16 98 % -- --   Vitals:   02/11/21 1945 02/11/21 2000 02/11/21 2015 02/11/21 2245  BP: 132/77 135/81 123/66 125/70  Pulse: 83 81 80 80  Resp:    16  Temp:      TempSrc:      SpO2: 99% 99% 99%   Weight:      Height:        Constitutional: Well-developed, well-nourished female in no acute distress.  Cardiovascular: normal rate and rhythm Respiratory: normal effort, clear to auscultation bilaterally GI: Abd soft, non-tender, gravid appropriate for gestational age.   No rebound or guarding. MS: Extremities nontender, no edema, normal ROM Neurologic: Alert and oriented x 4.   GU: Neg CVAT.  PELVIC EXAM:  deferred but no bleeding today (spotting stopped yesterday)  FHT:   155  Labs: Results for orders placed or performed during the hospital encounter of 02/11/21 (from the past 24 hour(s))  Urinalysis, Routine w reflex microscopic Urine, Clean Catch     Status: Abnormal   Collection Time: 02/11/21  6:57 PM  Result Value Ref Range   Color, Urine YELLOW YELLOW   APPearance HAZY (A) CLEAR   Specific Gravity, Urine 1.028 1.005 - 1.030   pH 5.0 5.0 - 8.0   Glucose, UA NEGATIVE NEGATIVE mg/dL   Hgb urine dipstick NEGATIVE NEGATIVE  Bilirubin Urine NEGATIVE NEGATIVE   Ketones, ur NEGATIVE NEGATIVE mg/dL   Protein, ur NEGATIVE NEGATIVE mg/dL   Nitrite NEGATIVE NEGATIVE   Leukocytes,Ua SMALL (A) NEGATIVE   RBC / HPF 0-5 0 - 5 RBC/hpf   WBC, UA 11-20 0 - 5 WBC/hpf   Bacteria, UA RARE (A) NONE SEEN   Squamous Epithelial / LPF 0-5 0 - 5   Mucus PRESENT   CBC     Status: Abnormal   Collection Time: 02/11/21  8:37 PM  Result Value Ref Range   WBC 11.2 (H) 4.0 - 10.5 K/uL   RBC 3.71 (L) 3.87 - 5.11 MIL/uL   Hemoglobin 11.4 (L) 12.0 - 15.0 g/dL   HCT 38.2 (L) 50.5 - 39.7 %   MCV 94.6 80.0 - 100.0 fL   MCH 30.7 26.0 - 34.0 pg   MCHC 32.5 30.0 - 36.0 g/dL   RDW 67.3 41.9 - 37.9 %   Platelets 251 150 - 400 K/uL   nRBC 0.0 0.0 - 0.2 %  Comprehensive metabolic panel     Status: Abnormal   Collection Time: 02/11/21  8:37 PM  Result Value Ref Range   Sodium 137 135 - 145 mmol/L   Potassium 3.4 (L) 3.5 - 5.1 mmol/L   Chloride 106 98 - 111 mmol/L   CO2 22 22 - 32 mmol/L   Glucose, Bld 96 70 - 99 mg/dL   BUN 7 6 - 20 mg/dL   Creatinine, Ser 0.24 0.44 - 1.00 mg/dL   Calcium 9.3 8.9 - 09.7 mg/dL   Total Protein 6.6 6.5 - 8.1 g/dL   Albumin 3.3 (L) 3.5 - 5.0 g/dL   AST 15 15 - 41 U/L   ALT 11 0 - 44 U/L   Alkaline Phosphatase 41 38 - 126 U/L   Total Bilirubin 0.3 0.3 - 1.2 mg/dL   GFR, Estimated >35 >32 mL/min   Anion gap 9 5 - 15  Protein / creatinine  ratio, urine     Status: None   Collection Time: 02/11/21  8:37 PM  Result Value Ref Range   Creatinine, Urine 212.59 mg/dL   Total Protein, Urine 14 mg/dL   Protein Creatinine Ratio 0.07 0.00 - 0.15 mg/mg[Cre]  Wet prep, genital     Status: Abnormal   Collection Time: 02/11/21 10:03 PM   Specimen: PATH Cytology Cervicovaginal Ancillary Only  Result Value Ref Range   Yeast Wet Prep HPF POC NONE SEEN NONE SEEN   Trich, Wet Prep NONE SEEN NONE SEEN   Clue Cells Wet Prep HPF POC PRESENT (A) NONE SEEN   WBC, Wet Prep HPF POC MANY (A) NONE SEEN   Sperm NONE SEEN     Imaging:  Limited OB US, reading pending No previa Baby measured 14+ wks in some measurements MFM will determine most accurate dating  MAU Course/MDM: I have ordered labs and reviewed results.  Will check baseline preeclampsia labs  Will give a liter of fluid for rehydration Will give Phenergan and Pepcid for nausea  >> good relief Will get limited OB US to check placentation >> no previa  Treatments in MAU included IV hydration with Phenergan and Pepcid.    Assessment: 1. [redacted] weeks gestation of pregnancy   2. Supervision of high risk pregnancy, antepartum   3. Vaginal bleeding in pregnancy, second trimester   4.     Nausea and vomiting with mild dehydration 5.      Chronic Hypertension in pregnancy  Plan: Discharge home Rx  Phenergan for nausea Rx Zofran for use if Phenergan does not help Follow up in Office for New OB next week  Pt stable at time of discharge.  Wynelle Bourgeois CNM, MSN Certified Nurse-Midwife 02/11/2021 8:33 PM

## 2021-02-11 NOTE — ED Triage Notes (Signed)
Patient sent to MAU for evaluation of pre-eclampsia, patient checked in to ED. Patient alert, oriented, and in no apparent distress at this time.

## 2021-02-11 NOTE — Progress Notes (Signed)
      Virtual Visit via Telephone Note:    I connected with Paula Dominguez on 02/11/21 by telephone and verified that I am speaking with the correct person using two identifiers. Telephone visit lasted 30 minutes.          Patient: Home  Provider: [facility]      PRENATAL INTAKE SUMMARY  Paula Dominguez presents today New OB Nurse Interview.  OB History    Gravida  9   Para  7   Term  7   Preterm  0   AB  1   Living  7     SAB  1   IAB      Ectopic      Multiple  0   Live Births  7          I have reviewed the patient's medical, obstetrical, social, and family histories, medications, and available lab results.  SUBJECTIVE She has complaints of headache and visual changes, nausea and vomiting, and acid reflux. Pt is unable to check blood pressure as she has no cuff at home.     OBJECTIVE Initial Prenatal Intake (New OB)  GENERAL APPEARANCE: Sounded alert and in no distress over the phone.  ASSESSMENT High risk pregnancy due to history of CHTN, PreEclampsia, and AMA.   PLAN Pt has NOB visit with MD next week. Since patient is c/o severe HA with blurred vision, will send patient to MAU for evaluation. Spoke with Dr. Clearance Coots who agreed with this plan. Pt agreed also and verbalized understanding. Will send in patient BP cuff, omeprazole since Pepcid is not working, and phenergan for nausea.

## 2021-02-11 NOTE — ED Provider Notes (Signed)
Emergency Medicine Provider OB Triage Evaluation Note  Paula Dominguez is a 40 y.o. female, [id number], at [redacted]w[redacted]d gestation who presents to the emergency department with complaints of headaches.  Patient called her OB office earlier today, CMA touched base with Dr. Clearance Coots.  They recommended patient proceed to the MAU.  Review of  Systems  Positive: Headaches, nausea, vomiting, small amount of vaginal bleeding Negative: Confusion, neurologic deficit  Physical Exam  BP (!) 139/94 (BP Location: Right Arm)   Pulse 88   Temp 98.5 F (36.9 C) (Oral)   Resp 16   LMP 10/23/2020   SpO2 98%  General: Awake, no distress  HEENT: Atraumatic  Resp: Normal effort  Cardiac: Normal rate Abd: Nondistended, nontender  MSK: Moves all extremities without difficulty Neuro: Speech clear  Medical Decision Making  Pt evaluated for pregnancy concern and is stable for transfer to MAU. Pt is in agreement with plan for transfer.  5:34 PM Discussed with MAU APP, Denny Peon, who accepts patient in transfer.  Clinical Impression  No diagnosis found.     Concepcion Living 02/11/21 2036    Jacalyn Lefevre, MD 02/11/21 2041

## 2021-02-11 NOTE — Discharge Instructions (Signed)
Second Trimester of Pregnancy  The second trimester of pregnancy is from week 13 through week 27. This is months 4 through 6 of pregnancy. The second trimester is often a time when you feel your best. Your body has adjusted to being pregnant, and you begin to feel better physically. During the second trimester:  Morning sickness has lessened or stopped completely.  You may have more energy.  You may have an increase in appetite. The second trimester is also a time when the unborn baby (fetus) is growing rapidly. At the end of the sixth month, the fetus may be up to 12 inches long and weigh about 1 pounds. You will likely begin to feel the baby move (quickening) between 16 and 20 weeks of pregnancy. Body changes during your second trimester Your body continues to go through many changes during your second trimester. The changes vary and generally return to normal after the baby is born. Physical changes  Your weight will continue to increase. You will notice your lower abdomen bulging out.  You may begin to get stretch marks on your hips, abdomen, and breasts.  Your breasts will continue to grow and to become tender.  Dark spots or blotches (chloasma or mask of pregnancy) may develop on your face.  A dark line from your belly button to the pubic area (linea nigra) may appear.  You may have changes in your hair. These can include thickening of your hair, rapid growth, and changes in texture. Some people also have hair loss during or after pregnancy, or hair that feels dry or thin. Health changes  You may develop headaches.  You may have heartburn.  You may develop constipation.  You may develop hemorrhoids or swollen, bulging veins (varicose veins).  Your gums may bleed and may be sensitive to brushing and flossing.  You may urinate more often because the fetus is pressing on your bladder.  You may have back pain. This is caused by: ? Weight gain. ? Pregnancy hormones that  are relaxing the joints in your pelvis. ? A shift in weight and the muscles that support your balance. Follow these instructions at home: Medicines  Follow your health care provider's instructions regarding medicine use. Specific medicines may be either safe or unsafe to take during pregnancy. Do not take any medicines unless approved by your health care provider.  Take a prenatal vitamin that contains at least 600 micrograms (mcg) of folic acid. Eating and drinking  Eat a healthy diet that includes fresh fruits and vegetables, whole grains, good sources of protein such as meat, eggs, or tofu, and low-fat dairy products.  Avoid raw meat and unpasteurized juice, milk, and cheese. These carry germs that can harm you and your baby.  You may need to take these actions to prevent or treat constipation: ? Drink enough fluid to keep your urine pale yellow. ? Eat foods that are high in fiber, such as beans, whole grains, and fresh fruits and vegetables. ? Limit foods that are high in fat and processed sugars, such as fried or sweet foods. Activity  Exercise only as directed by your health care provider. Most people can continue their usual exercise routine during pregnancy. Try to exercise for 30 minutes at least 5 days a week. Stop exercising if you develop contractions in your uterus.  Stop exercising if you develop pain or cramping in the lower abdomen or lower back.  Avoid exercising if it is very hot or humid or if you are at   a high altitude.  Avoid heavy lifting.  If you choose to, you may have sex unless your health care provider tells you not to. Relieving pain and discomfort  Wear a supportive bra to prevent discomfort from breast tenderness.  Take warm sitz baths to soothe any pain or discomfort caused by hemorrhoids. Use hemorrhoid cream if your health care provider approves.  Rest with your legs raised (elevated) if you have leg cramps or low back pain.  If you develop  varicose veins: ? Wear support hose as told by your health care provider. ? Elevate your feet for 15 minutes, 3-4 times a day. ? Limit salt in your diet. Safety  Wear your seat belt at all times when driving or riding in a car.  Talk with your health care provider if someone is verbally or physically abusive to you. Lifestyle  Do not use hot tubs, steam rooms, or saunas.  Do not douche. Do not use tampons or scented sanitary pads.  Avoid cat litter boxes and soil used by cats. These carry germs that can cause birth defects in the baby and possibly loss of the fetus by miscarriage or stillbirth.  Do not use herbal remedies, alcohol, illegal drugs, or medicines that are not approved by your health care provider. Chemicals in these products can harm your baby.  Do not use any products that contain nicotine or tobacco, such as cigarettes, e-cigarettes, and chewing tobacco. If you need help quitting, ask your health care provider. General instructions  During a routine prenatal visit, your health care provider will do a physical exam and other tests. He or she will also discuss your overall health. Keep all follow-up visits. This is important.  Ask your health care provider for a referral to a local prenatal education class.  Ask for help if you have counseling or nutritional needs during pregnancy. Your health care provider can offer advice or refer you to specialists for help with various needs. Where to find more information  American Pregnancy Association: americanpregnancy.org  American College of Obstetricians and Gynecologists: acog.org/en/Womens%20Health/Pregnancy  Office on Women's Health: womenshealth.gov/pregnancy Contact a health care provider if you have:  A headache that does not go away when you take medicine.  Vision changes or you see spots in front of your eyes.  Mild pelvic cramps, pelvic pressure, or nagging pain in the abdominal area.  Persistent nausea,  vomiting, or diarrhea.  A bad-smelling vaginal discharge or foul-smelling urine.  Pain when you urinate.  Sudden or extreme swelling of your face, hands, ankles, feet, or legs.  A fever. Get help right away if you:  Have fluid leaking from your vagina.  Have spotting or bleeding from your vagina.  Have severe abdominal cramping or pain.  Have difficulty breathing.  Have chest pain.  Have fainting spells.  Have not felt your baby move for the time period told by your health care provider.  Have new or increased pain, swelling, or redness in an arm or leg. Summary  The second trimester of pregnancy is from week 13 through week 27 (months 4 through 6).  Do not use herbal remedies, alcohol, illegal drugs, or medicines that are not approved by your health care provider. Chemicals in these products can harm your baby.  Exercise only as directed by your health care provider. Most people can continue their usual exercise routine during pregnancy.  Keep all follow-up visits. This is important. This information is not intended to replace advice given to you by your   health care provider. Make sure you discuss any questions you have with your health care provider. Document Revised: 05/20/2020 Document Reviewed: 03/26/2020 Elsevier Patient Education  2021 [employer] Morning Sickness  Morning sickness is when a woman feels nauseous during pregnancy. This nauseous feeling may or may not come with vomiting. It often occurs in the morning, but it can be a problem at any time of day. Morning sickness is most common during the first trimester. In some cases, it may continue throughout pregnancy. Although morning sickness is unpleasant, it is usually harmless unless the woman develops severe and continual vomiting (hyperemesis gravidarum), a condition that requires more intense treatment. What are the causes? The exact cause of this condition is not known, but it seems to be related to  normal hormonal changes that occur in pregnancy. What increases the risk? You are more likely to develop this condition if:  You experienced nausea or vomiting before your pregnancy.  You had morning sickness during a previous pregnancy.  You are pregnant with more than one baby, such as twins. What are the signs or symptoms? Symptoms of this condition include:  Nausea.  Vomiting. How is this diagnosed? This condition is usually diagnosed based on your signs and symptoms. How is this treated? In many cases, treatment is not needed for this condition. Making some changes to what you eat may help to control symptoms. Your health care provider may also prescribe or recommend:  Vitamin B6 supplements.  Anti-nausea medicines.  Ginger. Follow these instructions at home: Medicines  Take over-the-counter and prescription medicines only as told by your health care provider. Do not use any prescription, over-the-counter, or herbal medicines for morning sickness without first talking with your health care provider.  Take multivitamins before getting pregnant. This can prevent or decrease the severity of morning sickness in most women. Eating and drinking  Eat a piece of dry toast or crackers before getting out of bed in the morning.  Eat 5 or 6 small meals a day.  Eat dry and bland foods, such as rice or a baked potato. Foods that are high in carbohydrates are often helpful.  Avoid greasy, fatty, and spicy foods.  Have someone cook for you if the smell of any food causes nausea and vomiting.  If you feel nauseous after taking prenatal vitamins, take the vitamins at night or with a snack.  Eat a protein snack between meals if you are hungry. Nuts, yogurt, and cheese are good options.  Drink fluids throughout the day.  Try ginger ale made with real ginger, ginger tea made from fresh grated ginger, or ginger candies. General instructions  Do not use any products that contain  nicotine or tobacco. These products include cigarettes, chewing tobacco, and vaping devices, such as e-cigarettes. If you need help quitting, ask your health care provider.  Get an air purifier to keep the air in your house free of odors.  Get plenty of fresh air.  Try to avoid odors that trigger your nausea.  Consider trying these methods to help relieve symptoms: ? Wearing an acupressure wristband. These wristbands are often worn for seasickness. ? Acupuncture. Contact a health care provider if:  Your home remedies are not working and you need medicine.  You feel dizzy or light-headed.  You are losing weight. Get help right away if:  You have persistent and uncontrolled nausea and vomiting.  You faint.  You have severe pain in your abdomen. Summary  Morning sickness is when a woman  feels nauseous during pregnancy. This nauseous feeling may or may not come with vomiting.  Morning sickness is most common during the first trimester.  It often occurs in the morning, but it can be a problem at any time of day.  In many cases, treatment is not needed for this condition. Making some changes to what you eat may help to control symptoms. This information is not intended to replace advice given to you by your health care provider. Make sure you discuss any questions you have with your health care provider. Document Revised: 07/27/2020 Document Reviewed: 07/06/2020 Elsevier Patient Education  2021 [employer].

## 2021-02-12 LAB — GC/CHLAMYDIA PROBE AMP (~~LOC~~) NOT AT ARMC
Chlamydia: NEGATIVE
Comment: NEGATIVE
Comment: NORMAL
Neisseria Gonorrhea: NEGATIVE

## 2021-02-18 ENCOUNTER — Telehealth: Payer: Self-pay

## 2021-02-18 NOTE — Telephone Encounter (Signed)
Pt LVM on nurse line. She did not indicate the reason for call  Returned pt's call, no answer left VM

## 2021-02-19 ENCOUNTER — Encounter: Payer: Self-pay | Admitting: Family Medicine

## 2021-02-19 ENCOUNTER — Encounter: Payer: Self-pay | Admitting: Obstetrics and Gynecology

## 2021-02-19 ENCOUNTER — Other Ambulatory Visit: Payer: Self-pay

## 2021-02-19 ENCOUNTER — Other Ambulatory Visit (HOSPITAL_COMMUNITY)
Admission: RE | Admit: 2021-02-19 | Discharge: 2021-02-19 | Disposition: A | Discharge status: Home / Self Care | Payer: 59 | Source: Ambulatory Visit | Attending: Family Medicine | Admitting: Family Medicine

## 2021-02-19 ENCOUNTER — Ambulatory Visit (INDEPENDENT_AMBULATORY_CARE_PROVIDER_SITE_OTHER): Payer: 59 | Admitting: Family Medicine

## 2021-02-19 VITALS — BP 162/92 | HR 92 | Wt 195.6 lb

## 2021-02-19 DIAGNOSIS — Z641 Problems related to multiparity: Secondary | ICD-10-CM

## 2021-02-19 DIAGNOSIS — Z23 Encounter for immunization: Secondary | ICD-10-CM

## 2021-02-19 DIAGNOSIS — O0990 Supervision of high risk pregnancy, unspecified, unspecified trimester: Secondary | ICD-10-CM | POA: Diagnosis not present

## 2021-02-19 DIAGNOSIS — G43809 Other migraine, not intractable, without status migrainosus: Secondary | ICD-10-CM | POA: Diagnosis not present

## 2021-02-19 DIAGNOSIS — Z8759 Personal history of other complications of pregnancy, childbirth and the puerperium: Secondary | ICD-10-CM

## 2021-02-19 DIAGNOSIS — O10919 Unspecified pre-existing hypertension complicating pregnancy, unspecified trimester: Secondary | ICD-10-CM | POA: Diagnosis not present

## 2021-02-19 DIAGNOSIS — F321 Major depressive disorder, single episode, moderate: Secondary | ICD-10-CM

## 2021-02-19 MED ORDER — CYCLOBENZAPRINE HCL 10 MG PO TABS: 10.0000 mg | ORAL_TABLET | Freq: Three times a day (TID) | ORAL | 1 refills | Status: DC | PRN

## 2021-02-19 MED ORDER — ONDANSETRON 4 MG PO TBDP: 4.0000 mg | ORAL_TABLET | Freq: Four times a day (QID) | ORAL | 2 refills | Status: DC | PRN

## 2021-02-19 MED ORDER — NIFEDIPINE ER OSMOTIC RELEASE 30 MG PO TB24
30.0000 mg | ORAL_TABLET | Freq: Every day | ORAL | 5 refills | Status: DC
Start: 2021-02-19 — End: 2021-05-20

## 2021-02-19 NOTE — Progress Notes (Signed)
Subjective:   Paula Dominguez is a 40 y.o. [id number] at [redacted]w[redacted]d by 14wk [place] being seen today for her first obstetrical visit.  Her obstetrical history is significant for pre-eclampsia and cHTN, and mood disorder. Patient does not intend to breast feed. Pregnancy history fully reviewed.  Patient reports headache.  HISTORY: OB History  Gravida Para Term Preterm AB Living  9 7 7  0 1 7  SAB IAB Ectopic Multiple Live Births  1 0 0 0 7    # Outcome Date GA Lbr Len/2nd Weight Sex Delivery Anes PTL Lv  9 Current           8 Term 11/10/18 [redacted]w[redacted]d 08:49 / 00:03 5 lb 12.6 oz (2.625 kg) F Vag-Spont EPI  LIV     Name: PHALLON, HAYDU     Apgar1: 8  Apgar5: 9  7 Term 11/20/13 [redacted]w[redacted]d 06:10 / 00:12 6 lb 7 oz (2.92 kg) M Vag-Spont EPI, Local  LIV     Name: [redacted]w[redacted]d     Apgar1: 9  Apgar5: 9  6 Term 12/23/10 [redacted]w[redacted]d  6 lb (2.722 kg) M Vag-Spont EPI  LIV     Birth Comments: preeclampsia  5 Term 03/29/06 [redacted]w[redacted]d  7 lb (3.175 kg) F Vag-Spont   LIV  4 SAB 2006 [redacted]w[redacted]d         3 Term 10/22/02 [redacted]w[redacted]d  7 lb (3.175 kg) M Vag-Spont   LIV  2 Term 08/10/98 [redacted]w[redacted]d  7 lb (3.175 kg) F Vag-Spont EPI  LIV  1 Term 03/23/95 [redacted]w[redacted]d  7 lb (3.175 kg) M Vag-Spont EPI  LIV   Last pap smear was  05/2018 and was normal Past Medical History:  Diagnosis Date  . Anemia   . Anxiety and depression   . Blood transfusion without reported diagnosis   . GERD (gastroesophageal reflux disease)   . Hx of preeclampsia, prior pregnancy, currently pregnant    prior pregnancy  . Hypertension    Past Surgical History:  Procedure Laterality Date  . HEMORRHOID SURGERY     Family History  Problem Relation Age of Onset  . Healthy Mother   . Healthy Father   . Hypertension Other   . Cancer Other    Social History   Tobacco Use  . Smoking status: Former Smoker    Packs/day: 1.00    Years: 17.00    Pack years: 17.00    Types: Cigarettes    Quit date: 07/27/2013    Years since quitting: 7.5  . Smokeless tobacco: Never  Used  Vaping Use  . Vaping Use: Former  Substance Use Topics  . Alcohol use: No  . Drug use: No   No Known Allergies Current Outpatient Medications on File Prior to Visit  Medication Sig Dispense Refill  . aspirin 81 MG chewable tablet Chew 1 tablet (81 mg total) by mouth daily. 30 tablet 5  . labetalol (NORMODYNE) 100 MG tablet Take 1 tablet by mouth 2 (two) times daily.    09/26/2013 omeprazole (PRILOSEC) 40 MG capsule Take 1 capsule (40 mg total) by mouth daily. 30 capsule 6  . ondansetron (ZOFRAN ODT) 4 MG disintegrating tablet Take 1 tablet (4 mg total) by mouth every 6 (six) hours as needed for nausea. If Phenergan doesn't work 20 tablet 0  . Prenat-FeBis-FePro-FA-CA-Omega (PR NATAL 400 EC) 29-1-200 & 400 MG (DR) MISC Take 1 tablet by mouth daily.    . promethazine (PHENERGAN) 25 MG tablet Take 1 tablet (25 mg total) by  mouth every 6 (six) hours as needed for nausea or vomiting. 30 tablet 2   No current facility-administered medications on file prior to visit.     Exam   Vitals:   02/19/21 0919  BP: (!) 162/92  Pulse: 92  Weight: 195 lb 9.6 oz (88.7 kg)   Fetal Heart Rate (bpm): 148  Uterus:     Pelvic Exam: Perineum: no hemorrhoids, normal perineum   Vulva: normal external genitalia, no lesions   Vagina:  normal mucosa, normal discharge   Cervix: no lesions and normal, pap smear done.    Adnexa: normal adnexa and no mass, fullness, tenderness   Bony Pelvis: average  System: General: well-developed, well-nourished female in no acute distress   Breast:  normal appearance, no masses or tenderness   Skin: normal coloration and turgor, no rashes   Neurologic: oriented, normal, negative, normal mood   Extremities: normal strength, tone, and muscle mass, ROM of all joints is normal   HEENT PERRLA, extraocular movement intact and sclera clear, anicteric   Mouth/Teeth mucous membranes moist, pharynx normal without lesions and dental hygiene good   Neck supple and no masses    Cardiovascular: regular rate and rhythm   Respiratory:  no respiratory distress, normal breath sounds   Abdomen: soft, non-tender; bowel sounds normal; no masses,  no organomegaly     Assessment:   Pregnancy: [id number] Patient Active Problem List   Diagnosis Date Noted  . Supervision of high risk pregnancy, antepartum 02/11/2021  . Antepartum bleeding, second trimester 02/11/2021  . Chronic hypertension during pregnancy, antepartum 07/11/2018  . Grand multiparity 07/11/2018  . Iron deficiency anemia 01/08/2018  . Genital warts 01/12/2016  . Migraine headache 01/12/2016  . Mixed hyperlipidemia 01/12/2016  . Moderate major depression (HCC) 01/12/2016  . Panic disorder with agoraphobia and moderate panic attacks 01/09/2014     Plan:  1. Supervision of high risk pregnancy, antepartum Patient desires BTL, sign at 28 weeks Counseled on vasectomy as well Initial labs drawn. Pap collected Continue prenatal vitamins. Genetic Screening discussed, NIPS: ordered. Ultrasound discussed; fetal anatomic survey: ordered. Problem list reviewed and updated.  2. Chronic hypertension during pregnancy, antepartum BP elevated on labetalol 100mg  BID Switch to Nifedipine 30 XL for ease, BP check in 1 week Baseline labs from [facility] visit 02/11/21 unremarkable  3. Other migraine without status migrainosus, not intractable Ongoing headaches, review of chart shows this has been ongoing for many years Trial flexeril  4. Grand multiparity   5. Moderate major depression (HCC) Reports mood is good Accepts referral to [facility] regardless due to extensive hx of depression  6. History of pre-eclampsia On baby ASA  Routine obstetric precautions reviewed. Return in 4 weeks (on 03/19/2021) for [facility], ob visit, needs MD.

## 2021-02-19 NOTE — Progress Notes (Signed)
Pt presents today for NOB visit. Pt c/o consistent HA's. She was placed on labatelol from PCP, 100mg  BID. BP is elevated today. Pt given BP cuff at visit since insurance will not cover.  Flu vaccine 0.8mL given IM Right deltoid. Pt tolerated well.

## 2021-02-19 NOTE — Patient Instructions (Signed)
 Second Trimester of Pregnancy  The second trimester of pregnancy is from week 13 through week 27. This is months 4 through 6 of pregnancy. The second trimester is often a time when you feel your best. Your body has adjusted to being pregnant, and you begin to feel better physically. During the second trimester:  Morning sickness has lessened or stopped completely.  You may have more energy.  You may have an increase in appetite. The second trimester is also a time when the unborn baby (fetus) is growing rapidly. At the end of the sixth month, the fetus may be up to 12 inches long and weigh about 1 pounds. You will likely begin to feel the baby move (quickening) between 16 and 20 weeks of pregnancy. Body changes during your second trimester Your body continues to go through many changes during your second trimester. The changes vary and generally return to normal after the baby is born. Physical changes  Your weight will continue to increase. You will notice your lower abdomen bulging out.  You may begin to get stretch marks on your hips, abdomen, and breasts.  Your breasts will continue to grow and to become tender.  Dark spots or blotches (chloasma or mask of pregnancy) may develop on your face.  A dark line from your belly button to the pubic area (linea nigra) may appear.  You may have changes in your hair. These can include thickening of your hair, rapid growth, and changes in texture. Some people also have hair loss during or after pregnancy, or hair that feels dry or thin. Health changes  You may develop headaches.  You may have heartburn.  You may develop constipation.  You may develop hemorrhoids or swollen, bulging veins (varicose veins).  Your gums may bleed and may be sensitive to brushing and flossing.  You may urinate more often because the fetus is pressing on your bladder.  You may have back pain. This is caused by: ? Weight gain. ? Pregnancy hormones  that are relaxing the joints in your pelvis. ? A shift in weight and the muscles that support your balance. Follow these instructions at home: Medicines  Follow your health care provider's instructions regarding medicine use. Specific medicines may be either safe or unsafe to take during pregnancy. Do not take any medicines unless approved by your health care provider.  Take a prenatal vitamin that contains at least 600 micrograms (mcg) of folic acid. Eating and drinking  Eat a healthy diet that includes fresh fruits and vegetables, whole grains, good sources of protein such as meat, eggs, or tofu, and low-fat dairy products.  Avoid raw meat and unpasteurized juice, milk, and cheese. These carry germs that can harm you and your baby.  You may need to take these actions to prevent or treat constipation: ? Drink enough fluid to keep your urine pale yellow. ? Eat foods that are high in fiber, such as beans, whole grains, and fresh fruits and vegetables. ? Limit foods that are high in fat and processed sugars, such as fried or sweet foods. Activity  Exercise only as directed by your health care provider. Most people can continue their usual exercise routine during pregnancy. Try to exercise for 30 minutes at least 5 days a week. Stop exercising if you develop contractions in your uterus.  Stop exercising if you develop pain or cramping in the lower abdomen or lower back.  Avoid exercising if it is very hot or humid or if you are   at a high altitude.  Avoid heavy lifting.  If you choose to, you may have sex unless your health care provider tells you not to. Relieving pain and discomfort  Wear a supportive bra to prevent discomfort from breast tenderness.  Take warm sitz baths to soothe any pain or discomfort caused by hemorrhoids. Use hemorrhoid cream if your health care provider approves.  Rest with your legs raised (elevated) if you have leg cramps or low back pain.  If you develop  varicose veins: ? Wear support hose as told by your health care provider. ? Elevate your feet for 15 minutes, 3-4 times a day. ? Limit salt in your diet. Safety  Wear your seat belt at all times when driving or riding in a car.  Talk with your health care provider if someone is verbally or physically abusive to you. Lifestyle  Do not use hot tubs, steam rooms, or saunas.  Do not douche. Do not use tampons or scented sanitary pads.  Avoid cat litter boxes and soil used by cats. These carry germs that can cause birth defects in the baby and possibly loss of the fetus by miscarriage or stillbirth.  Do not use herbal remedies, alcohol, illegal drugs, or medicines that are not approved by your health care provider. Chemicals in these products can harm your baby.  Do not use any products that contain nicotine or tobacco, such as cigarettes, e-cigarettes, and chewing tobacco. If you need help quitting, ask your health care provider. General instructions  During a routine prenatal visit, your health care provider will do a physical exam and other tests. He or she will also discuss your overall health. Keep all follow-up visits. This is important.  Ask your health care provider for a referral to a local prenatal education class.  Ask for help if you have counseling or nutritional needs during pregnancy. Your health care provider can offer advice or refer you to specialists for help with various needs. Where to find more information  American Pregnancy Association: americanpregnancy.org  American College of Obstetricians and Gynecologists: acog.org/en/Womens%20Health/Pregnancy  Office on Women's Health: womenshealth.gov/pregnancy Contact a health care provider if you have:  A headache that does not go away when you take medicine.  Vision changes or you see spots in front of your eyes.  Mild pelvic cramps, pelvic pressure, or nagging pain in the abdominal area.  Persistent nausea,  vomiting, or diarrhea.  A bad-smelling vaginal discharge or foul-smelling urine.  Pain when you urinate.  Sudden or extreme swelling of your face, hands, ankles, feet, or legs.  A fever. Get help right away if you:  Have fluid leaking from your vagina.  Have spotting or bleeding from your vagina.  Have severe abdominal cramping or pain.  Have difficulty breathing.  Have chest pain.  Have fainting spells.  Have not felt your baby move for the time period told by your health care provider.  Have new or increased pain, swelling, or redness in an arm or leg. Summary  The second trimester of pregnancy is from week 13 through week 27 (months 4 through 6).  Do not use herbal remedies, alcohol, illegal drugs, or medicines that are not approved by your health care provider. Chemicals in these products can harm your baby.  Exercise only as directed by your health care provider. Most people can continue their usual exercise routine during pregnancy.  Keep all follow-up visits. This is important. This information is not intended to replace advice given to you by   your health care provider. Make sure you discuss any questions you have with your health care provider. Document Revised: 05/20/2020 Document Reviewed: 03/26/2020 Elsevier Patient Education  2021 [employer]   Contraception Choices Contraception, also called birth control, refers to methods or devices that prevent pregnancy. Hormonal methods Contraceptive implant A contraceptive implant is a thin, plastic tube that contains a hormone that prevents pregnancy. It is different from an intrauterine device (IUD). It is inserted into the upper part of the arm by a health care provider. Implants can be effective for up to 3 years. Progestin-only injections Progestin-only injections are injections of progestin, a synthetic form of the hormone progesterone. They are given every 3 months by a health care provider. Birth control  pills Birth control pills are pills that contain hormones that prevent pregnancy. They must be taken once a day, preferably at the same time each day. A prescription is needed to use this method of contraception. Birth control patch The birth control patch contains hormones that prevent pregnancy. It is placed on the skin and must be changed once a week for three weeks and removed on the fourth week. A prescription is needed to use this method of contraception. Vaginal ring A vaginal ring contains hormones that prevent pregnancy. It is placed in the vagina for three weeks and removed on the fourth week. After that, the process is repeated with a new ring. A prescription is needed to use this method of contraception. Emergency contraceptive Emergency contraceptives prevent pregnancy after unprotected sex. They come in pill form and can be taken up to 5 days after sex. They work best the sooner they are taken after having sex. Most emergency contraceptives are available without a prescription. This method should not be used as your only form of birth control.   Barrier methods Female condom A female condom is a thin sheath that is worn over the penis during sex. Condoms keep sperm from going inside a woman's body. They can be used with a sperm-killing substance (spermicide) to increase their effectiveness. They should be thrown away after one use. Female condom A female condom is a soft, loose-fitting sheath that is put into the vagina before sex. The condom keeps sperm from going inside a woman's body. They should be thrown away after one use. Diaphragm A diaphragm is a soft, dome-shaped barrier. It is inserted into the vagina before sex, along with a spermicide. The diaphragm blocks sperm from entering the uterus, and the spermicide kills sperm. A diaphragm should be left in the vagina for 6-8 hours after sex and removed within 24 hours. A diaphragm is prescribed and fitted by a health care provider. A  diaphragm should be replaced every 1-2 years, after giving birth, after gaining more than 15 lb (6.8 kg), and after pelvic surgery. Cervical cap A cervical cap is a round, soft latex or plastic cup that fits over the cervix. It is inserted into the vagina before sex, along with spermicide. It blocks sperm from entering the uterus. The cap should be left in place for 6-8 hours after sex and removed within 48 hours. A cervical cap must be prescribed and fitted by a health care provider. It should be replaced every 2 years. Sponge A sponge is a soft, circular piece of polyurethane foam with spermicide in it. The sponge helps block sperm from entering the uterus, and the spermicide kills sperm. To use it, you make it wet and then insert it into the vagina. It should be   inserted before sex, left in for at least 6 hours after sex, and removed and thrown away within 30 hours. Spermicides Spermicides are chemicals that kill or block sperm from entering the cervix and uterus. They can come as a cream, jelly, suppository, foam, or tablet. A spermicide should be inserted into the vagina with an applicator at least 10-15 minutes before sex to allow time for it to work. The process must be repeated every time you have sex. Spermicides do not require a prescription.   Intrauterine contraception Intrauterine device (IUD) An IUD is a T-shaped device that is put in a woman's uterus. There are two types:  Hormone IUD.This type contains progestin, a synthetic form of the hormone progesterone. This type can stay in place for 3-5 years.  Copper IUD.This type is wrapped in copper wire. It can stay in place for 10 years. Permanent methods of contraception Female tubal ligation In this method, a woman's fallopian tubes are sealed, tied, or blocked during surgery to prevent eggs from traveling to the uterus. Hysteroscopic sterilization In this method, a small, flexible insert is placed into each fallopian tube. The inserts  cause scar tissue to form in the fallopian tubes and block them, so sperm cannot reach an egg. The procedure takes about 3 months to be effective. Another form of birth control must be used during those 3 months. Female sterilization This is a procedure to tie off the tubes that carry sperm (vasectomy). After the procedure, the man can still ejaculate fluid (semen). Another form of birth control must be used for 3 months after the procedure. Natural planning methods Natural family planning In this method, a couple does not have sex on days when the woman could become pregnant. Calendar method In this method, the woman keeps track of the length of each menstrual cycle, identifies the days when pregnancy can happen, and does not have sex on those days. Ovulation method In this method, a couple avoids sex during ovulation. Symptothermal method This method involves not having sex during ovulation. The woman typically checks for ovulation by watching changes in her temperature and in the consistency of cervical mucus. Post-ovulation method In this method, a couple waits to have sex until after ovulation. Where to find more information  Centers for Disease Control and Prevention: www.cdc.gov Summary  Contraception, also called birth control, refers to methods or devices that prevent pregnancy.  Hormonal methods of contraception include implants, injections, pills, patches, vaginal rings, and emergency contraceptives.  Barrier methods of contraception can include female condoms, female condoms, diaphragms, cervical caps, sponges, and spermicides.  There are two types of IUDs (intrauterine devices). An IUD can be put in a woman's uterus to prevent pregnancy for 3-5 years.  Permanent sterilization can be done through a procedure for males and females. Natural family planning methods involve nothaving sex on days when the woman could become pregnant. This information is not intended to replace advice  given to you by your health care provider. Make sure you discuss any questions you have with your health care provider. Document Revised: 05/18/2020 Document Reviewed: 05/18/2020 Elsevier Patient Education  2021 [employer] Inc.   Breastfeeding  Choosing to breastfeed is one of the best decisions you can make for yourself and your baby. A change in hormones during pregnancy causes your breasts to make breast milk in your milk-producing glands. Hormones prevent breast milk from being released before your baby is born. They also prompt milk flow after birth. Once breastfeeding has begun, thoughts of   your baby, as well as his or her sucking or crying, can stimulate the release of milk from your milk-producing glands. Benefits of breastfeeding Research shows that breastfeeding offers many health benefits for infants and mothers. It also offers a cost-free and convenient way to feed your baby. For your baby  Your first milk (colostrum) helps your baby's digestive system to function better.  Special cells in your milk (antibodies) help your baby to fight off infections.  Breastfed babies are less likely to develop asthma, allergies, obesity, or type 2 diabetes. They are also at lower risk for sudden infant death syndrome (SIDS).  Nutrients in breast milk are better able to meet your baby's needs compared to infant formula.  Breast milk improves your baby's brain development. For you  Breastfeeding helps to create a very special bond between you and your baby.  Breastfeeding is convenient. Breast milk costs nothing and is always available at the correct temperature.  Breastfeeding helps to burn calories. It helps you to lose the weight that you gained during pregnancy.  Breastfeeding makes your uterus return faster to its size before pregnancy. It also slows bleeding (lochia) after you give birth.  Breastfeeding helps to lower your risk of developing type 2 diabetes, osteoporosis, rheumatoid  arthritis, cardiovascular disease, and breast, ovarian, uterine, and endometrial cancer later in life. Breastfeeding basics Starting breastfeeding  Find a comfortable place to sit or lie down, with your neck and back well-supported.  Place a pillow or a rolled-up blanket under your baby to bring him or her to the level of your breast (if you are seated). Nursing pillows are specially designed to help support your arms and your baby while you breastfeed.  Make sure that your baby's tummy (abdomen) is facing your abdomen.  Gently massage your breast. With your fingertips, massage from the outer edges of your breast inward toward the nipple. This encourages milk flow. If your milk flows slowly, you may need to continue this action during the feeding.  Support your breast with 4 fingers underneath and your thumb above your nipple (make the letter "C" with your hand). Make sure your fingers are well away from your nipple and your baby's mouth.  Stroke your baby's lips gently with your finger or nipple.  When your baby's mouth is open wide enough, quickly bring your baby to your breast, placing your entire nipple and as much of the areola as possible into your baby's mouth. The areola is the colored area around your nipple. ? More areola should be visible above your baby's upper lip than below the lower lip. ? Your baby's lips should be opened and extended outward (flanged) to ensure an adequate, comfortable latch. ? Your baby's tongue should be between his or her lower gum and your breast.  Make sure that your baby's mouth is correctly positioned around your nipple (latched). Your baby's lips should create a seal on your breast and be turned out (everted).  It is common for your baby to suck about 2-3 minutes in order to start the flow of breast milk. Latching Teaching your baby how to latch onto your breast properly is very important. An improper latch can cause nipple pain, decreased milk  supply, and poor weight gain in your baby. Also, if your baby is not latched onto your nipple properly, he or she may swallow some air during feeding. This can make your baby fussy. Burping your baby when you switch breasts during the feeding can help to get rid   of the air. However, teaching your baby to latch on properly is still the best way to prevent fussiness from swallowing air while breastfeeding. Signs that your baby has successfully latched onto your nipple  Silent tugging or silent sucking, without causing you pain. Infant's lips should be extended outward (flanged).  Swallowing heard between every 3-4 sucks once your milk has started to flow (after your let-down milk reflex occurs).  Muscle movement above and in front of his or her ears while sucking. Signs that your baby has not successfully latched onto your nipple  Sucking sounds or smacking sounds from your baby while breastfeeding.  Nipple pain. If you think your baby has not latched on correctly, slip your finger into the corner of your baby's mouth to break the suction and place it between your baby's gums. Attempt to start breastfeeding again. Signs of successful breastfeeding Signs from your baby  Your baby will gradually decrease the number of sucks or will completely stop sucking.  Your baby will fall asleep.  Your baby's body will relax.  Your baby will retain a small amount of milk in his or her mouth.  Your baby will let go of your breast by himself or herself. Signs from you  Breasts that have increased in firmness, weight, and size 1-3 hours after feeding.  Breasts that are softer immediately after breastfeeding.  Increased milk volume, as well as a change in milk consistency and color by the fifth day of breastfeeding.  Nipples that are not sore, cracked, or bleeding. Signs that your baby is getting enough milk  Wetting at least 1-2 diapers during the first 24 hours after birth.  Wetting at least 5-6  diapers every 24 hours for the first week after birth. The urine should be clear or pale yellow by the age of 5 days.  Wetting 6-8 diapers every 24 hours as your baby continues to grow and develop.  At least 3 stools in a 24-hour period by the age of 5 days. The stool should be soft and yellow.  At least 3 stools in a 24-hour period by the age of 7 days. The stool should be seedy and yellow.  No loss of weight greater than 10% of birth weight during the first 3 days of life.  Average weight gain of 4-7 oz (113-198 g) per week after the age of 4 days.  Consistent daily weight gain by the age of 5 days, without weight loss after the age of 2 weeks. After a feeding, your baby may spit up a small amount of milk. This is normal. Breastfeeding frequency and duration Frequent feeding will help you make more milk and can prevent sore nipples and extremely full breasts (breast engorgement). Breastfeed when you feel the need to reduce the fullness of your breasts or when your baby shows signs of hunger. This is called "breastfeeding on demand." Signs that your baby is hungry include:  Increased alertness, activity, or restlessness.  Movement of the head from side to side.  Opening of the mouth when the corner of the mouth or cheek is stroked (rooting).  Increased sucking sounds, smacking lips, cooing, sighing, or squeaking.  Hand-to-mouth movements and sucking on fingers or hands.  Fussing or crying. Avoid introducing a pacifier to your baby in the first 4-6 weeks after your baby is born. After this time, you may choose to use a pacifier. Research has shown that pacifier use during the first year of a baby's life decreases the risk of   sudden infant death syndrome (SIDS). Allow your baby to feed on each breast as long as he or she wants. When your baby unlatches or falls asleep while feeding from the first breast, offer the second breast. Because newborns are often sleepy in the first few weeks of  life, you may need to awaken your baby to get him or her to feed. Breastfeeding times will vary from baby to baby. However, the following rules can serve as a guide to help you make sure that your baby is properly fed:  Newborns (babies 4 weeks of age or younger) may breastfeed every 1-3 hours.  Newborns should not go without breastfeeding for longer than 3 hours during the day or 5 hours during the night.  You should breastfeed your baby a minimum of 8 times in a 24-hour period. Breast milk pumping Pumping and storing breast milk allows you to make sure that your baby is exclusively fed your breast milk, even at times when you are unable to breastfeed. This is especially important if you go back to work while you are still breastfeeding, or if you are not able to be present during feedings. Your lactation consultant can help you find a method of pumping that works best for you and give you guidelines about how long it is safe to store breast milk.      Caring for your breasts while you breastfeed Nipples can become dry, cracked, and sore while breastfeeding. The following recommendations can help keep your breasts moisturized and healthy:  Avoid using soap on your nipples.  Wear a supportive bra designed especially for nursing. Avoid wearing underwire-style bras or extremely tight bras (sports bras).  Air-dry your nipples for 3-4 minutes after each feeding.  Use only cotton bra pads to absorb leaked breast milk. Leaking of breast milk between feedings is normal.  Use lanolin on your nipples after breastfeeding. Lanolin helps to maintain your skin's normal moisture barrier. Pure lanolin is not harmful (not toxic) to your baby. You may also hand express a few drops of breast milk and gently massage that milk into your nipples and allow the milk to air-dry. In the first few weeks after giving birth, some women experience breast engorgement. Engorgement can make your breasts feel heavy, warm,  and tender to the touch. Engorgement peaks within 3-5 days after you give birth. The following recommendations can help to ease engorgement:  Completely empty your breasts while breastfeeding or pumping. You may want to start by applying warm, moist heat (in the shower or with warm, water-soaked hand towels) just before feeding or pumping. This increases circulation and helps the milk flow. If your baby does not completely empty your breasts while breastfeeding, pump any extra milk after he or she is finished.  Apply ice packs to your breasts immediately after breastfeeding or pumping, unless this is too uncomfortable for you. To do this: ? Put ice in a plastic bag. ? Place a towel between your skin and the bag. ? Leave the ice on for 20 minutes, 2-3 times a day.  Make sure that your baby is latched on and positioned properly while breastfeeding. If engorgement persists after 48 hours of following these recommendations, contact your health care provider or a lactation consultant. Overall health care recommendations while breastfeeding  Eat 3 healthy meals and 3 snacks every day. Well-nourished mothers who are breastfeeding need an additional 450-500 calories a day. You can meet this requirement by increasing the amount of a balanced diet that   you eat.  Drink enough water to keep your urine pale yellow or clear.  Rest often, relax, and continue to take your prenatal vitamins to prevent fatigue, stress, and low vitamin and mineral levels in your body (nutrient deficiencies).  Do not use any products that contain nicotine or tobacco, such as cigarettes and e-cigarettes. Your baby may be harmed by chemicals from cigarettes that pass into breast milk and exposure to secondhand smoke. If you need help quitting, ask your health care provider.  Avoid alcohol.  Do not use illegal drugs or marijuana.  Talk with your health care provider before taking any medicines. These include over-the-counter and  prescription medicines as well as vitamins and herbal supplements. Some medicines that may be harmful to your baby can pass through breast milk.  It is possible to become pregnant while breastfeeding. If birth control is desired, ask your health care provider about options that will be safe while breastfeeding your baby. Where to find more information: [employer]: www.llli.org Contact a health care provider if:  You feel like you want to stop breastfeeding or have become frustrated with breastfeeding.  Your nipples are cracked or bleeding.  Your breasts are red, tender, or warm.  You have: ? Painful breasts or nipples. ? A swollen area on either breast. ? A fever or chills. ? Nausea or vomiting. ? Drainage other than breast milk from your nipples.  Your breasts do not become full before feedings by the fifth day after you give birth.  You feel sad and depressed.  Your baby is: ? Too sleepy to eat well. ? Having trouble sleeping. ? More than 1 week old and wetting fewer than 6 diapers in a 24-hour period. ? Not gaining weight by 5 days of age.  Your baby has fewer than 3 stools in a 24-hour period.  Your baby's skin or the white parts of his or her eyes become yellow. Get help right away if:  Your baby is overly tired (lethargic) and does not want to wake up and feed.  Your baby develops an unexplained fever. Summary  Breastfeeding offers many health benefits for infant and mothers.  Try to breastfeed your infant when he or she shows early signs of hunger.  Gently tickle or stroke your baby's lips with your finger or nipple to allow the baby to open his or her mouth. Bring the baby to your breast. Make sure that much of the areola is in your baby's mouth. Offer one side and burp the baby before you offer the other side.  Talk with your health care provider or lactation consultant if you have questions or you face problems as you breastfeed. This  information is not intended to replace advice given to you by your health care provider. Make sure you discuss any questions you have with your health care provider. Document Revised: 03/08/2018 Document Reviewed: 01/13/2017 Elsevier Patient Education  2021 Elsevier Inc.  

## 2021-02-21 LAB — AFP, SERUM, OPEN SPINA BIFIDA
AFP MoM: 0.79
AFP Value: 28.5 ng/mL
Gest. Age on Collection Date: 17 weeks
Maternal Age At EDD: 41.1 yr
OSBR Risk 1 IN: 10000
Test Results:: NEGATIVE
Weight: 195 [lb_av]

## 2021-02-21 LAB — CBC/D/PLT+RPR+RH+ABO+RUB AB...
Antibody Screen: NEGATIVE
Basophils Absolute: 0.1 10*3/uL (ref 0.0–0.2)
Basos: 0 %
EOS (ABSOLUTE): 0.1 10*3/uL (ref 0.0–0.4)
Eos: 1 %
HCV Ab: <0.1 s/co ratio (ref 0.0–0.9)
HIV Screen 4th Generation wRfx: NONREACTIVE
Hematocrit: 36.3 % (ref 34.0–46.6)
Hemoglobin: 12.4 g/dL (ref 11.1–15.9)
Hepatitis B Surface Ag: NEGATIVE
Immature Grans (Abs): 0.1 10*3/uL (ref 0.0–0.1)
Immature Granulocytes: 1 %
Lymphocytes Absolute: 2.4 10*3/uL (ref 0.7–3.1)
Lymphs: 20 %
MCH: 31.4 pg (ref 26.6–33.0)
MCHC: 34.2 g/dL (ref 31.5–35.7)
MCV: 92 fL (ref 79–97)
Monocytes Absolute: 0.5 10*3/uL (ref 0.1–0.9)
Monocytes: 4 %
Neutrophils Absolute: 8.5 10*3/uL — ABNORMAL HIGH (ref 1.4–7.0)
Neutrophils: 74 %
Platelets: 303 10*3/uL (ref 150–450)
RBC: 3.95 x10E6/uL (ref 3.77–5.28)
RDW: 13.2 % (ref 11.7–15.4)
RPR Ser Ql: NONREACTIVE
Rh Factor: POSITIVE
Rubella Antibodies, IGG: 1.17 index (ref >0.99)
WBC: 11.7 10*3/uL — ABNORMAL HIGH (ref 3.4–10.8)

## 2021-02-21 LAB — HCV INTERPRETATION

## 2021-02-21 LAB — HEMOGLOBIN A1C
Est. average glucose Bld gHb Est-mCnc: 111 mg/dL
Hgb A1c MFr Bld: 5.5 % (ref 4.8–5.6)

## 2021-02-22 LAB — CERVICOVAGINAL ANCILLARY ONLY
Chlamydia: NEGATIVE
Comment: NEGATIVE
Comment: NEGATIVE
Comment: NORMAL
Neisseria Gonorrhea: NEGATIVE
Trichomonas: POSITIVE — AB

## 2021-02-23 ENCOUNTER — Telehealth: Payer: Self-pay | Admitting: Family Medicine

## 2021-02-23 ENCOUNTER — Telehealth: Payer: Self-pay

## 2021-02-23 DIAGNOSIS — A599 Trichomoniasis, unspecified: Secondary | ICD-10-CM | POA: Insufficient documentation

## 2021-02-23 LAB — CULTURE, OB URINE

## 2021-02-23 LAB — URINE CULTURE, OB REFLEX

## 2021-02-23 MED ORDER — METRONIDAZOLE 500 MG PO TABS: 500.0000 mg | ORAL_TABLET | Freq: Two times a day (BID) | ORAL | 0 refills | Status: AC

## 2021-02-23 NOTE — Telephone Encounter (Signed)
Called patient to inform of positive results for trich and that treatment has been sent to the pharmacy. Patient advised to inform her partner about positive results and the need for treatment either at primary care or health department. Also advised to make sure that completes the full course of treatment and to abstain from sexual intercourse until 1 week after treatment has been completed. Patient verbalized understanding.

## 2021-02-23 NOTE — Telephone Encounter (Signed)
Please call patient and let her know that her prenatal swab came back positive for Trichomonas. Emphasize that this is a sexually transmitted disease that will require treatment for both her and her partner. I have sent a prescription to her pharmacy, please offer partner treatment as well (I can call in a prescription if she provides name and date of birth) or they can (ideally) go to their own medical provider. She should take the full course of flagyl and abstain from intercourse until 1 week after completing treatment. We will do a test of cure in a about a month.

## 2021-02-24 LAB — CYTOLOGY - PAP
Comment: NEGATIVE
Diagnosis: UNDETERMINED — AB
High risk HPV: NEGATIVE

## 2021-02-26 ENCOUNTER — Other Ambulatory Visit: Payer: Self-pay

## 2021-02-26 ENCOUNTER — Ambulatory Visit (INDEPENDENT_AMBULATORY_CARE_PROVIDER_SITE_OTHER): Payer: 59 | Admitting: Obstetrics and Gynecology

## 2021-02-26 VITALS — BP 130/85 | HR 84

## 2021-02-26 DIAGNOSIS — Z0130 Encounter for examination of blood pressure without abnormal findings: Secondary | ICD-10-CM

## 2021-02-26 MED ORDER — PNV PRENATAL PLUS MULTIVITAMIN 27-1 MG PO TABS: 1.0000 | ORAL_TABLET | Freq: Every day | ORAL | 11 refills | Status: DC

## 2021-02-26 NOTE — Progress Notes (Signed)
Subjective:  Paula Dominguez is a 40 y.o. female here for BP check.  Pt is unsure if she is taking an increase dose of Labetalol or if she started on the Procardia. Pt states recent Rx did not go to correct pharmacy so she is unsure of current Rx.  Hypertension ROS: taking medications as instructed, no medication side effects noted, no TIA's, no chest pain on exertion, no dyspnea on exertion and no swelling of ankles.    Objective:  BP 130/85   Pulse 84   LMP 10/23/2020   Appearance alert, well appearing, and in no distress. General exam BP noted to be well controlled today in office.   Assessment:   Blood Pressure stable.   Plan:  pt to contact office to verify medications and update to plan of care. Marland Kitchen

## 2021-02-26 NOTE — Progress Notes (Signed)
I have reviewed this chart and agree with the RN/CMA assessment and management.    K. Meryl Aamani Moose, MD, FACOG [facility])  

## 2021-03-03 ENCOUNTER — Telehealth: Payer: Self-pay

## 2021-03-03 NOTE — Telephone Encounter (Signed)
S/w pt about paperwork for intermittent breaks. Advised that the paperwork that we have does not have a place to include this request. Pt was unsure of what the paperwork looked like, advised to discuss at visit on 03-05-21, pt agreed.

## 2021-03-05 ENCOUNTER — Other Ambulatory Visit: Payer: Self-pay | Admitting: Family Medicine

## 2021-03-05 ENCOUNTER — Ambulatory Visit (INDEPENDENT_AMBULATORY_CARE_PROVIDER_SITE_OTHER): Payer: 59

## 2021-03-05 ENCOUNTER — Other Ambulatory Visit: Payer: Self-pay

## 2021-03-05 VITALS — BP 129/84 | HR 98 | Wt 200.0 lb

## 2021-03-05 DIAGNOSIS — Z0130 Encounter for examination of blood pressure without abnormal findings: Secondary | ICD-10-CM

## 2021-03-05 NOTE — Progress Notes (Addendum)
Subjective:  Paula Dominguez is a 40 y.o. female here for BP check.   Hypertension ROS: taking medications as instructed, no medication side effects noted, no TIA's, no chest pain on exertion, no dyspnea on exertion and no swelling of ankles.    Objective:  BP 129/84   Pulse 98   Wt 200 lb (90.7 kg)   LMP 10/23/2020   BMI 32.28 kg/m   Appearance alert, well appearing, and in no distress. General exam BP noted to be well controlled today in office.    Assessment:   Blood Pressure well controlled.   Plan:  Current treatment plan is effective, no change in therapy. keep next appointment.  Patient was assessed and managed by nursing staff during this encounter. I have reviewed the chart and agree with the documentation and plan. I have also made any necessary editorial changes.  Coral Ceo, MD 03/09/2021 8:25 AM

## 2021-03-08 ENCOUNTER — Other Ambulatory Visit: Payer: Self-pay

## 2021-03-08 ENCOUNTER — Other Ambulatory Visit: Payer: Self-pay | Admitting: *Deleted

## 2021-03-08 ENCOUNTER — Ambulatory Visit: Payer: 59 | Attending: Family Medicine

## 2021-03-08 ENCOUNTER — Encounter: Payer: Self-pay | Admitting: Family Medicine

## 2021-03-08 ENCOUNTER — Other Ambulatory Visit: Payer: Self-pay | Admitting: Family Medicine

## 2021-03-08 DIAGNOSIS — O10919 Unspecified pre-existing hypertension complicating pregnancy, unspecified trimester: Secondary | ICD-10-CM

## 2021-03-08 DIAGNOSIS — O0990 Supervision of high risk pregnancy, unspecified, unspecified trimester: Secondary | ICD-10-CM | POA: Diagnosis present

## 2021-03-12 ENCOUNTER — Ambulatory Visit: Payer: 59

## 2021-03-16 ENCOUNTER — Encounter: Payer: Self-pay | Admitting: Obstetrics and Gynecology

## 2021-03-19 ENCOUNTER — Other Ambulatory Visit (HOSPITAL_COMMUNITY)
Admission: RE | Admit: 2021-03-19 | Discharge: 2021-03-19 | Disposition: A | Discharge status: Home / Self Care | Payer: 59 | Source: Ambulatory Visit | Attending: Obstetrics and Gynecology | Admitting: Obstetrics and Gynecology

## 2021-03-19 ENCOUNTER — Ambulatory Visit (INDEPENDENT_AMBULATORY_CARE_PROVIDER_SITE_OTHER): Payer: 59 | Admitting: Obstetrics and Gynecology

## 2021-03-19 ENCOUNTER — Encounter: Payer: Self-pay | Admitting: Obstetrics and Gynecology

## 2021-03-19 ENCOUNTER — Other Ambulatory Visit: Payer: Self-pay

## 2021-03-19 ENCOUNTER — Encounter: Payer: 59 | Admitting: Obstetrics and Gynecology

## 2021-03-19 VITALS — BP 118/81 | HR 81 | Wt 200.0 lb

## 2021-03-19 DIAGNOSIS — Z8759 Personal history of other complications of pregnancy, childbirth and the puerperium: Secondary | ICD-10-CM

## 2021-03-19 DIAGNOSIS — A599 Trichomoniasis, unspecified: Secondary | ICD-10-CM

## 2021-03-19 DIAGNOSIS — O0990 Supervision of high risk pregnancy, unspecified, unspecified trimester: Secondary | ICD-10-CM | POA: Insufficient documentation

## 2021-03-19 DIAGNOSIS — Z3A20 20 weeks gestation of pregnancy: Secondary | ICD-10-CM

## 2021-03-19 DIAGNOSIS — O10919 Unspecified pre-existing hypertension complicating pregnancy, unspecified trimester: Secondary | ICD-10-CM

## 2021-03-19 DIAGNOSIS — L299 Pruritus, unspecified: Secondary | ICD-10-CM

## 2021-03-19 DIAGNOSIS — D509 Iron deficiency anemia, unspecified: Secondary | ICD-10-CM

## 2021-03-19 DIAGNOSIS — F321 Major depressive disorder, single episode, moderate: Secondary | ICD-10-CM

## 2021-03-19 DIAGNOSIS — K219 Gastro-esophageal reflux disease without esophagitis: Secondary | ICD-10-CM

## 2021-03-19 DIAGNOSIS — G43809 Other migraine, not intractable, without status migrainosus: Secondary | ICD-10-CM

## 2021-03-19 DIAGNOSIS — F4001 Agoraphobia with panic disorder: Secondary | ICD-10-CM

## 2021-03-19 MED ORDER — OMEPRAZOLE 40 MG PO CPDR: 40.0000 mg | DELAYED_RELEASE_CAPSULE | Freq: Every day | ORAL | 2 refills | Status: DC

## 2021-03-19 NOTE — Progress Notes (Signed)
PRENATAL VISIT NOTE  Subjective:  Paula Dominguez is a 40 y.o. [id number] at [redacted]w[redacted]d being seen today for ongoing prenatal care.  She is currently monitored for the following issues for this high-risk pregnancy and has History of pre-eclampsia; Panic disorder with agoraphobia and moderate panic attacks; Chronic hypertension during pregnancy, antepartum; Grand multiparity; Genital warts; Iron deficiency anemia; Migraine headache; Mixed hyperlipidemia; Moderate major depression (HCC); Supervision of high risk pregnancy, antepartum; Antepartum bleeding, second trimester; and Trichomoniasis on their problem list.  Patient reports multiple concerns today. Specifically, pt reports worsening daily headaches. No visual disturbances. She is also feeling intense fatigue but no chest pain or dyspnea. She is concerned that her symptoms are affecting her performance at work. She has discussed these concerns with HR and is planning for an extended leave of absence at this time.  Contractions: Not present. Vag. Bleeding: None.  Movement: Present. Denies leaking of fluid.   The following portions of the patient's history were reviewed and updated as appropriate: allergies, current medications, past family history, past medical history, past social history, past surgical history and problem list.   Objective:   Vitals:   03/19/21 1102  BP: 118/81  Pulse: 81  Weight: 200 lb (90.7 kg)    Fetal Status: Fetal Heart Rate (bpm): 148   Movement: Present     General:  Alert, oriented and cooperative. Patient is in no acute distress.  Skin: Skin is warm and dry. No rash noted.   Cardiovascular: Normal heart rate noted  Respiratory: Normal respiratory effort, no problems with respiration noted  Abdomen: Soft, gravid, appropriate for gestational age.  Pain/Pressure: Present     Pelvic: Cervical exam deferred        Extremities: Normal range of motion.  Edema: Trace  Mental Status: Normal mood and affect. Normal  behavior. Normal judgment and thought content.   Assessment and Plan:  Pregnancy: [id number] at [redacted]w[redacted]d 1. Supervision of high risk pregnancy, antepartum, [redacted] weeks gestation of pregnancy: No red flag signs/symptoms today but pt has multiple complaints as noted below. +FM and FHT wnl. BP wnl today. - pt strongly desires leave of absence given her concern that current pregnancy symptoms are affecting her ability to perform at her job. S/p long conversation with pt today, discussed that we can sign paperwork needed to allow for an extended leave of absence. - plan for ultrasound on 4/11 - rtc on 4/22 for f/u prenatal appt for sooner as needed for return precautions as noted  2. Trichomonas: Pt reports that her and her partner were treated. However, she was having daily emesis while taking flagyl, so she is concerned as to whether the medication was effective. No concerning GU symptoms today. - TOC today  3. Chronic Hypertension  H/o Preeclampsia  : Pt reports daily migraines, worse than prior pregnancies. Baseline UP:C 0.07 on 02/11/21. BP low normal today but pt reporting systolic blood pressures in 130s-140s daily at home. No symptoms of lightheadedness/dizziness. - continue nifedipine 30 mg daily, ASA - provided precautions to call if low-normal blood pressures at home given BP low-normal today  4. Migraines: Pt reports daily migraines, worse than prior pregnancies.  - recommended use of tylenol prn, flexeril prn, in addition to magnesium daily.  5. Fatigue  H/o IDA: Hgb wnl at initial prenatal appt. No current iron supplementation. - f/u repeat CBC  6. Diffuse Itching: pt denies h/o seasonal allergies. No RUQ pain. - f/u bile acids  7. GERD: pt reports inadequate relief with H2  blocker and requests to resume PPI today. - refill of PPI  8. Depression  Anxiety: No safety concerns today. No current medications. - BH referral previously placed  Preterm labor symptoms and general obstetric  precautions including but not limited to vaginal bleeding, contractions, leaking of fluid and fetal movement were reviewed in detail with the patient. Please refer to After Visit Summary for other counseling recommendations.   Return in about 4 weeks (around 04/16/2021) for f/u prenatal appt any provider, in person.  Future Appointments  Date Time Provider Department Center  04/05/2021  3:30 PM [facility] NURSE [facility]  04/05/2021  3:45 PM [facility]4 [facility]    Sheila Oats, MD

## 2021-03-19 NOTE — Progress Notes (Signed)
ROB c/o headaches 6-8/10 x 20 weeks and nose bleeds.  She wants to wait before signing BTS.

## 2021-03-19 NOTE — Patient Instructions (Signed)

## 2021-03-21 DIAGNOSIS — Z3A20 20 weeks gestation of pregnancy: Secondary | ICD-10-CM | POA: Insufficient documentation

## 2021-03-22 ENCOUNTER — Other Ambulatory Visit: Payer: 59

## 2021-03-22 ENCOUNTER — Other Ambulatory Visit: Payer: Self-pay

## 2021-03-22 ENCOUNTER — Other Ambulatory Visit (INDEPENDENT_AMBULATORY_CARE_PROVIDER_SITE_OTHER): Payer: 59

## 2021-03-22 DIAGNOSIS — O10919 Unspecified pre-existing hypertension complicating pregnancy, unspecified trimester: Secondary | ICD-10-CM

## 2021-03-22 DIAGNOSIS — L299 Pruritus, unspecified: Secondary | ICD-10-CM

## 2021-03-22 LAB — CERVICOVAGINAL ANCILLARY ONLY
Comment: NEGATIVE
Trichomonas: POSITIVE — AB

## 2021-03-24 ENCOUNTER — Telehealth: Payer: Self-pay | Admitting: Obstetrics and Gynecology

## 2021-03-24 DIAGNOSIS — A599 Trichomoniasis, unspecified: Secondary | ICD-10-CM

## 2021-03-24 LAB — COMPREHENSIVE METABOLIC PANEL
ALT: 6 IU/L (ref 0–32)
AST: 13 IU/L (ref 0–40)
Albumin/Globulin Ratio: 1.3 (ref 1.2–2.2)
Albumin: 3.6 g/dL — ABNORMAL LOW (ref 3.8–4.8)
Alkaline Phosphatase: 40 IU/L — ABNORMAL LOW (ref 44–121)
BUN/Creatinine Ratio: 12 (ref 9–23)
BUN: 7 mg/dL (ref 6–24)
Bilirubin Total: <0.2 mg/dL (ref 0.0–1.2)
CO2: 21 mmol/L (ref 20–29)
Calcium: 9.1 mg/dL (ref 8.7–10.2)
Chloride: 103 mmol/L (ref 96–106)
Creatinine, Ser: 0.6 mg/dL (ref 0.57–1.00)
Globulin, Total: 2.7 g/dL (ref 1.5–4.5)
Glucose: 63 mg/dL — ABNORMAL LOW (ref 65–99)
Potassium: 3.6 mmol/L (ref 3.5–5.2)
Sodium: 138 mmol/L (ref 134–144)
Total Protein: 6.3 g/dL (ref 6.0–8.5)
eGFR: 116 mL/min/{1.73_m2} (ref >59)

## 2021-03-24 LAB — PROTEIN / CREATININE RATIO, URINE
Creatinine, Urine: 155.2 mg/dL
Protein, Ur: 12.9 mg/dL
Protein/Creat Ratio: 83 mg/g creat (ref 0–200)

## 2021-03-24 LAB — CBC
Hematocrit: 33.8 % — ABNORMAL LOW (ref 34.0–46.6)
Hemoglobin: 11.2 g/dL (ref 11.1–15.9)
MCH: 30.8 pg (ref 26.6–33.0)
MCHC: 33.1 g/dL (ref 31.5–35.7)
MCV: 93 fL (ref 79–97)
Platelets: 268 10*3/uL (ref 150–450)
RBC: 3.64 x10E6/uL — ABNORMAL LOW (ref 3.77–5.28)
RDW: 12.8 % (ref 11.7–15.4)
WBC: 11.2 10*3/uL — ABNORMAL HIGH (ref 3.4–10.8)

## 2021-03-24 LAB — BILE ACIDS, TOTAL: Bile Acids Total: 2.4 umol/L (ref 0.0–10.0)

## 2021-03-24 MED ORDER — METRONIDAZOLE 500 MG PO TABS: 500.0000 mg | ORAL_TABLET | Freq: Two times a day (BID) | ORAL | 0 refills | Status: AC

## 2021-03-24 NOTE — Telephone Encounter (Signed)
Attempted to call pt. Unable to leave message given voice mailbox full, so message also sent to pt's MyChart. Given TOC for Trichomonas still positive, recommended repeat course of flagyl BID x7d (sent to pt's preferred pharmacy). Of note, pt was having daily emesis with first course of flagyl. Encouraged pt to call clinic if any additional questions/concerns prior to next prenatal appointment.  Sheila Oats, MD OB Fellow, Faculty Practice 03/24/2021 12:05 PM

## 2021-03-26 ENCOUNTER — Other Ambulatory Visit: Payer: Self-pay

## 2021-03-26 ENCOUNTER — Ambulatory Visit (INDEPENDENT_AMBULATORY_CARE_PROVIDER_SITE_OTHER): Payer: 59

## 2021-03-26 VITALS — BP 136/87 | HR 80

## 2021-03-26 DIAGNOSIS — Z0130 Encounter for examination of blood pressure without abnormal findings: Secondary | ICD-10-CM

## 2021-03-26 DIAGNOSIS — O10919 Unspecified pre-existing hypertension complicating pregnancy, unspecified trimester: Secondary | ICD-10-CM

## 2021-03-26 NOTE — Progress Notes (Signed)
Subjective:  Paula Dominguez is a 40 y.o. female here for BP check. She reports not taking BP meds for the past 2 days.  Pt c/o muscle cramps after taking BP meds.  Hypertension ROS: not taking medications regularly as instructed, no medication side effects noted, no TIA's, no chest pain on exertion, no dyspnea on exertion and no swelling of ankles.    Objective:  136/87  LMP 10/23/2020   Appearance alert, well appearing, and in no distress. General exam BP noted to be needs mild improvement  Assessment:   Blood Pressure needs improvement.   Plan:  Take BP meds daily as instructed. Keep upcoming appt. Take Flexeril for muscle cramps

## 2021-03-26 NOTE — Progress Notes (Signed)
Patient was assessed and managed by nursing staff during this encounter. I have reviewed the chart and agree with the documentation and plan. I have also made any necessary editorial changes.  Jaynie Collins, MD 03/26/2021 12:16 PM

## 2021-03-30 ENCOUNTER — Encounter: Payer: 59 | Admitting: Licensed Clinical Social Worker

## 2021-04-01 ENCOUNTER — Ambulatory Visit: Payer: 59

## 2021-04-02 ENCOUNTER — Ambulatory Visit (INDEPENDENT_AMBULATORY_CARE_PROVIDER_SITE_OTHER): Payer: 59

## 2021-04-02 ENCOUNTER — Other Ambulatory Visit: Payer: Self-pay

## 2021-04-02 VITALS — BP 127/86 | HR 81

## 2021-04-02 DIAGNOSIS — Z0130 Encounter for examination of blood pressure without abnormal findings: Secondary | ICD-10-CM

## 2021-04-02 NOTE — Progress Notes (Signed)
Agree with A & P. 

## 2021-04-02 NOTE — Progress Notes (Signed)
Subjective:  Paula Dominguez is a 40 y.o. female here for BP check.   Hypertension ROS: no medication side effects noted, no TIA's, no chest pain on exertion, no dyspnea on exertion and no swelling of ankles.    Objective:  LMP 10/23/2020   Appearance alert, well appearing, and in no distress. General exam BP noted to be well controlled today in office.   Assessment:   Blood Pressure well controlled.   Plan:  Continue BP meds as directed

## 2021-04-05 ENCOUNTER — Encounter: Payer: Self-pay | Admitting: *Deleted

## 2021-04-05 ENCOUNTER — Other Ambulatory Visit: Payer: Self-pay

## 2021-04-05 ENCOUNTER — Ambulatory Visit: Payer: 59 | Admitting: *Deleted

## 2021-04-05 ENCOUNTER — Ambulatory Visit: Payer: 59 | Attending: Obstetrics and Gynecology

## 2021-04-05 DIAGNOSIS — O4692 Antepartum hemorrhage, unspecified, second trimester: Secondary | ICD-10-CM

## 2021-04-05 DIAGNOSIS — O99212 Obesity complicating pregnancy, second trimester: Secondary | ICD-10-CM

## 2021-04-05 DIAGNOSIS — O09292 Supervision of pregnancy with other poor reproductive or obstetric history, second trimester: Secondary | ICD-10-CM

## 2021-04-05 DIAGNOSIS — O0990 Supervision of high risk pregnancy, unspecified, unspecified trimester: Secondary | ICD-10-CM | POA: Insufficient documentation

## 2021-04-05 DIAGNOSIS — O321XX0 Maternal care for breech presentation, not applicable or unspecified: Secondary | ICD-10-CM

## 2021-04-05 DIAGNOSIS — O10919 Unspecified pre-existing hypertension complicating pregnancy, unspecified trimester: Secondary | ICD-10-CM | POA: Diagnosis present

## 2021-04-05 DIAGNOSIS — O10012 Pre-existing essential hypertension complicating pregnancy, second trimester: Secondary | ICD-10-CM

## 2021-04-05 DIAGNOSIS — E669 Obesity, unspecified: Secondary | ICD-10-CM | POA: Diagnosis not present

## 2021-04-05 DIAGNOSIS — Z362 Encounter for other antenatal screening follow-up: Secondary | ICD-10-CM

## 2021-04-05 DIAGNOSIS — Z3A22 22 weeks gestation of pregnancy: Secondary | ICD-10-CM

## 2021-04-06 ENCOUNTER — Other Ambulatory Visit: Payer: Self-pay | Admitting: *Deleted

## 2021-04-06 DIAGNOSIS — O10912 Unspecified pre-existing hypertension complicating pregnancy, second trimester: Secondary | ICD-10-CM

## 2021-04-08 ENCOUNTER — Ambulatory Visit: Payer: 59

## 2021-04-16 ENCOUNTER — Encounter: Payer: Self-pay | Admitting: Obstetrics & Gynecology

## 2021-04-16 ENCOUNTER — Other Ambulatory Visit: Payer: Self-pay

## 2021-04-16 ENCOUNTER — Encounter: Payer: 59 | Admitting: Obstetrics & Gynecology

## 2021-04-16 ENCOUNTER — Ambulatory Visit (INDEPENDENT_AMBULATORY_CARE_PROVIDER_SITE_OTHER): Payer: 59 | Admitting: Obstetrics & Gynecology

## 2021-04-16 VITALS — BP 126/87 | HR 87 | Wt 202.0 lb

## 2021-04-16 DIAGNOSIS — O10919 Unspecified pre-existing hypertension complicating pregnancy, unspecified trimester: Secondary | ICD-10-CM

## 2021-04-16 DIAGNOSIS — O0990 Supervision of high risk pregnancy, unspecified, unspecified trimester: Secondary | ICD-10-CM

## 2021-04-16 DIAGNOSIS — O26899 Other specified pregnancy related conditions, unspecified trimester: Secondary | ICD-10-CM

## 2021-04-16 DIAGNOSIS — Z3A24 24 weeks gestation of pregnancy: Secondary | ICD-10-CM

## 2021-04-16 DIAGNOSIS — G5600 Carpal tunnel syndrome, unspecified upper limb: Secondary | ICD-10-CM

## 2021-04-16 DIAGNOSIS — O219 Vomiting of pregnancy, unspecified: Secondary | ICD-10-CM

## 2021-04-16 DIAGNOSIS — G43809 Other migraine, not intractable, without status migrainosus: Secondary | ICD-10-CM

## 2021-04-16 DIAGNOSIS — K219 Gastro-esophageal reflux disease without esophagitis: Secondary | ICD-10-CM

## 2021-04-16 MED ORDER — OMEPRAZOLE 40 MG PO CPDR: 40.0000 mg | DELAYED_RELEASE_CAPSULE | Freq: Every day | ORAL | 4 refills | Status: DC

## 2021-04-16 MED ORDER — CYCLOBENZAPRINE HCL 10 MG PO TABS: 10.0000 mg | ORAL_TABLET | Freq: Three times a day (TID) | ORAL | 1 refills | Status: DC | PRN

## 2021-04-16 MED ORDER — ONDANSETRON 4 MG PO TBDP: 4.0000 mg | ORAL_TABLET | Freq: Four times a day (QID) | ORAL | 2 refills | Status: DC | PRN

## 2021-04-16 MED ORDER — PROMETHAZINE HCL 25 MG RE SUPP: 25.0000 mg | Freq: Four times a day (QID) | RECTAL | 0 refills | Status: DC | PRN

## 2021-04-16 NOTE — Progress Notes (Signed)
ROB 24w  CC: Constant  HA's, nose bleeds, dizziness pt states vomiting is getting worse phenergan is not helping.   Pt would like refill on Zofran and Flexeril. Pt states a different PNV was to be sent because original one sent cost over $100 states heartburn in increased wants increase on Rx for heartburn.  Leg cramps "charlie horses" in both legs, carpal tunnel.

## 2021-04-16 NOTE — Patient Instructions (Addendum)
Return to office for any scheduled appointments. Call the office or go to the MAU at [facility] if:  You begin to have strong, frequent contractions  Your water breaks.  Sometimes it is a big gush of fluid, sometimes it is just a trickle that keeps getting your panties wet or running down your legs  You have vaginal bleeding.  It is normal to have a small amount of spotting if your cervix was checked.   You do not feel your baby moving like normal.  If you do not, get something to eat and drink and lay down and focus on feeling your baby move.   If your baby is still not moving like normal, you should call the office or go to MAU.  Any other obstetric concerns.   Carpal Tunnel Syndrome  Carpal tunnel syndrome is a condition that causes pain, numbness, and weakness in your hand and fingers. The carpal tunnel is a narrow area located on the palm side of your wrist. Repeated wrist motion or certain diseases may cause swelling within the tunnel. This swelling pinches the main nerve in the wrist. The main nerve in the wrist is called the median nerve. What are the causes? This condition may be caused by:  Repeated and forceful wrist and hand motions.  Wrist injuries.  Arthritis.  A cyst or tumor in the carpal tunnel.  Fluid buildup during pregnancy.  Use of tools that vibrate. Sometimes the cause of this condition is not known. What increases the risk? The following factors may make you more likely to develop this condition:  Having a job that requires you to repeatedly or forcefully move your wrist or hand or requires you to use tools that vibrate. This may include jobs that involve using computers, working on an [employer], or working with power tools such as Radiographer, therapeutic.  Being a woman.  Having certain conditions, such as: ? Diabetes. ? Obesity. ? An underactive thyroid (hypothyroidism). ? Kidney failure. ? Rheumatoid arthritis. What  are the signs or symptoms? Symptoms of this condition include:  A tingling feeling in your fingers, especially in your thumb, index, and middle fingers.  Tingling or numbness in your hand.  An aching feeling in your entire arm, especially when your wrist and elbow are bent for a long time.  Wrist pain that goes up your arm to your shoulder.  Pain that goes down into your palm or fingers.  A weak feeling in your hands. You may have trouble grabbing and holding items. Your symptoms may feel worse during the night. How is this diagnosed? This condition is diagnosed with a medical history and physical exam. You may also have tests, including:  Electromyogram (EMG). This test measures electrical signals sent by your nerves into the muscles.  Nerve conduction study. This test measures how well electrical signals pass through your nerves.  Imaging tests, such as X-rays, ultrasound, and MRI. These tests check for possible causes of your condition. How is this treated? This condition may be treated with:  Lifestyle changes. It is important to stop or change the activity that caused your condition.  Doing exercise and activities to strengthen and stretch your muscles and tendons (physical therapy).  Making lifestyle changes to help with your condition and learning how to do your daily activities safely (occupational therapy).  Medicines for pain and inflammation. This may include medicine that is injected into your wrist.  A wrist splint or brace.  Surgery. Follow these instructions at home: If you have a splint or brace:  Wear the splint or brace as told by your health care provider. Remove it only as told by your health care provider.  Loosen the splint or brace if your fingers tingle, become numb, or turn cold and blue.  Keep the splint or brace clean.  If the splint or brace is not waterproof: ? Do not let it get wet. ? Cover it with a watertight covering when you take a  bath or shower. Managing pain, stiffness, and swelling If directed, put ice on the painful area. To do this:  If you have a removeable splint or brace, remove it as told by your health care provider.  Put ice in a plastic bag.  Place a towel between your skin and the bag or between the splint or brace and the bag.  Leave the ice on for 20 minutes, 2-3 times a day. Do not fall asleep with the cold pack on your skin.  Remove the ice if your skin turns bright red. This is very important. If you cannot feel pain, heat, or cold, you have a greater risk of damage to the area. Move your fingers often to reduce stiffness and swelling.   General instructions  Take over-the-counter and prescription medicines only as told by your health care provider.  Rest your wrist and hand from any activity that may be causing your pain. If your condition is work related, talk with your employer about changes that can be made, such as getting a wrist pad to use while typing.  Do any exercises as told by your health care provider, physical therapist, or occupational therapist.  Keep all follow-up visits. This is important. Contact a health care provider if:  You have new symptoms.  Your pain is not controlled with medicines.  Your symptoms get worse. Get help right away if:  You have severe numbness or tingling in your wrist or hand. Summary  Carpal tunnel syndrome is a condition that causes pain, numbness, and weakness in your hand and fingers.  It is usually caused by repeated wrist motions.  Lifestyle changes and medicines are used to treat carpal tunnel syndrome. Surgery may be recommended.  Follow your health care provider's instructions about wearing a splint, resting from activity, keeping follow-up visits, and calling for help. This information is not intended to replace advice given to you by your health care provider. Make sure you discuss any questions you have with your health care  provider. Document Revised: 04/23/2020 Document Reviewed: 04/23/2020 Elsevier Patient Education  2021 [employer].

## 2021-04-16 NOTE — Progress Notes (Signed)
PRENATAL VISIT NOTE  Subjective:  Paula Dominguez is a 40 y.o. [id number] at [redacted]w[redacted]d being seen today for ongoing prenatal care.  She is currently monitored for the following issues for this high-risk pregnancy and has History of pre-eclampsia; Panic disorder with agoraphobia and moderate panic attacks; Chronic hypertension during pregnancy, antepartum; Grand multiparity; Genital warts; Iron deficiency anemia; Migraine headache; Mixed hyperlipidemia; Moderate major depression (HCC); Supervision of high risk pregnancy, antepartum; Antepartum bleeding, second trimester; and Trichomoniasis on their problem list.  Patient reports multiple complaints as outlined in RN note. Desires refill of Zofran and Flexeril for nausea and leg cramps and migraines. Also desires omeprazole for heartburn. Contractions: Irritability. Vag. Bleeding: None.  Movement: Present. Denies leaking of fluid.   The following portions of the patient's history were reviewed and updated as appropriate: allergies, current medications, past family history, past medical history, past social history, past surgical history and problem list.   Objective:   Vitals:   04/16/21 1101  BP: 126/87  Pulse: 87  Weight: 202 lb (91.6 kg)    Fetal Status: Fetal Heart Rate (bpm): 134   Movement: Present     General:  Alert, oriented and cooperative. Patient is in no acute distress.  Skin: Skin is warm and dry. No rash noted.   Cardiovascular: Normal heart rate noted  Respiratory: Normal respiratory effort, no problems with respiration noted  Abdomen: Soft, gravid, appropriate for gestational age.  Pain/Pressure: Present     Pelvic: Cervical exam deferred        Extremities: Normal range of motion.  Edema: Trace  Mental Status: Normal mood and affect. Normal behavior. Normal judgment and thought content.   Assessment and Plan:  Pregnancy: [id number] at [redacted]w[redacted]d 1. Chronic hypertension during pregnancy, antepartum Stable BP on Nifedipine.  Continue serial scans and planned antenatal testing as per MFM.   2. Other migraine without status migrainosus, not intractable Flexeril refilled. - cyclobenzaprine (FLEXERIL) 10 MG tablet; Take 1 tablet (10 mg total) by mouth every 8 (eight) hours as needed for muscle spasms.  Dispense: 30 tablet; Refill: 1  3. Gastroesophageal reflux disease without esophagitis Prilosec refilled - omeprazole (PRILOSEC) 40 MG capsule; Take 1 capsule (40 mg total) by mouth daily.  Dispense: 60 capsule; Refill: 4  4. Nausea/vomiting in pregnancy Antiemetics refilled. - ondansetron (ZOFRAN ODT) 4 MG disintegrating tablet; Take 1 tablet (4 mg total) by mouth every 6 (six) hours as needed for nausea. If Phenergan doesn't work  Dispense: 20 tablet; Refill: 2 - promethazine (PHENERGAN) 25 MG suppository; Place 1 suppository (25 mg total) rectally every 6 (six) hours as needed for nausea or vomiting.  Dispense: 12 each; Refill: 0  5. Carpal tunnel syndrome during pregnancy Advised to try wrist splints, also gave other information that may help symptoms.  6. [redacted] weeks gestation of pregnancy 7. Supervision of high risk pregnancy, antepartum Saline nasal spray recommended for nose bleeding. Prenatal vitamin samples given, told to call insurance company and find out what is covered so we can prescribe accordingly.  Preterm labor symptoms and general obstetric precautions including but not limited to vaginal bleeding, contractions, leaking of fluid and fetal movement were reviewed in detail with the patient. Please refer to After Visit Summary for other counseling recommendations.   Return in about 4 weeks (around 05/14/2021) for 2 hr GTT, 3rd trimester labs, TDap, OFFICE OB VISIT (MD only).  Future Appointments  Date Time Provider Department Center  05/04/2021 10:45 AM [facility] NURSE [facility]  05/04/2021 11:00 AM  [facility] US1 [facility]  05/13/2021  8:30 AM [facility] None  05/13/2021  9:45 AM Alysia Penna,  Marolyn Hammock, MD [facility] None    Jaynie Collins, MD

## 2021-05-03 ENCOUNTER — Telehealth: Payer: Self-pay | Admitting: Licensed Clinical Social Worker

## 2021-05-03 NOTE — Telephone Encounter (Signed)
Called pt for schedule appt for 5/16 left message for callback

## 2021-05-04 ENCOUNTER — Ambulatory Visit: Payer: 59 | Attending: Maternal & Fetal Medicine

## 2021-05-04 ENCOUNTER — Encounter: Payer: Self-pay | Admitting: *Deleted

## 2021-05-04 ENCOUNTER — Ambulatory Visit: Payer: 59 | Admitting: *Deleted

## 2021-05-04 ENCOUNTER — Other Ambulatory Visit: Payer: Self-pay

## 2021-05-04 ENCOUNTER — Other Ambulatory Visit: Payer: Self-pay | Admitting: *Deleted

## 2021-05-04 DIAGNOSIS — O09522 Supervision of elderly multigravida, second trimester: Secondary | ICD-10-CM | POA: Diagnosis not present

## 2021-05-04 DIAGNOSIS — O10912 Unspecified pre-existing hypertension complicating pregnancy, second trimester: Secondary | ICD-10-CM | POA: Insufficient documentation

## 2021-05-04 DIAGNOSIS — O4692 Antepartum hemorrhage, unspecified, second trimester: Secondary | ICD-10-CM | POA: Insufficient documentation

## 2021-05-04 DIAGNOSIS — Z362 Encounter for other antenatal screening follow-up: Secondary | ICD-10-CM | POA: Diagnosis not present

## 2021-05-04 DIAGNOSIS — E669 Obesity, unspecified: Secondary | ICD-10-CM

## 2021-05-04 DIAGNOSIS — O09292 Supervision of pregnancy with other poor reproductive or obstetric history, second trimester: Secondary | ICD-10-CM

## 2021-05-04 DIAGNOSIS — O99212 Obesity complicating pregnancy, second trimester: Secondary | ICD-10-CM | POA: Diagnosis not present

## 2021-05-04 DIAGNOSIS — Z3A26 26 weeks gestation of pregnancy: Secondary | ICD-10-CM

## 2021-05-04 DIAGNOSIS — O10012 Pre-existing essential hypertension complicating pregnancy, second trimester: Secondary | ICD-10-CM | POA: Diagnosis not present

## 2021-05-04 DIAGNOSIS — O0990 Supervision of high risk pregnancy, unspecified, unspecified trimester: Secondary | ICD-10-CM | POA: Diagnosis present

## 2021-05-04 DIAGNOSIS — O10919 Unspecified pre-existing hypertension complicating pregnancy, unspecified trimester: Secondary | ICD-10-CM

## 2021-05-10 ENCOUNTER — Telehealth: Payer: Self-pay | Admitting: Licensed Clinical Social Worker

## 2021-05-10 ENCOUNTER — Encounter: Payer: 59 | Admitting: Licensed Clinical Social Worker

## 2021-05-10 NOTE — Telephone Encounter (Signed)
Pt called twice and mychart video link was sent via text message to Ms. Roxan Hockey. Detailed voicemail was left during both attempts to reach Ms. Roxan Hockey. Pt no show visit.

## 2021-05-13 ENCOUNTER — Ambulatory Visit (INDEPENDENT_AMBULATORY_CARE_PROVIDER_SITE_OTHER): Payer: 59 | Admitting: Obstetrics and Gynecology

## 2021-05-13 ENCOUNTER — Other Ambulatory Visit: Payer: Self-pay | Admitting: Obstetrics and Gynecology

## 2021-05-13 ENCOUNTER — Other Ambulatory Visit (HOSPITAL_COMMUNITY)
Admission: RE | Admit: 2021-05-13 | Discharge: 2021-05-13 | Disposition: A | Discharge status: Home / Self Care | Payer: 59 | Source: Ambulatory Visit | Attending: Nurse Practitioner | Admitting: Nurse Practitioner

## 2021-05-13 ENCOUNTER — Other Ambulatory Visit: Payer: 59

## 2021-05-13 ENCOUNTER — Other Ambulatory Visit: Payer: Self-pay

## 2021-05-13 VITALS — BP 155/103 | HR 89 | Wt 208.4 lb

## 2021-05-13 DIAGNOSIS — Z23 Encounter for immunization: Secondary | ICD-10-CM | POA: Diagnosis not present

## 2021-05-13 DIAGNOSIS — O0990 Supervision of high risk pregnancy, unspecified, unspecified trimester: Secondary | ICD-10-CM | POA: Diagnosis present

## 2021-05-13 DIAGNOSIS — O10919 Unspecified pre-existing hypertension complicating pregnancy, unspecified trimester: Secondary | ICD-10-CM

## 2021-05-13 DIAGNOSIS — Z8759 Personal history of other complications of pregnancy, childbirth and the puerperium: Secondary | ICD-10-CM

## 2021-05-13 MED ORDER — LABETALOL HCL 200 MG PO TABS: 200.0000 mg | ORAL_TABLET | Freq: Two times a day (BID) | ORAL | 3 refills | Status: DC

## 2021-05-13 NOTE — Patient Instructions (Signed)

## 2021-05-13 NOTE — Progress Notes (Signed)
Subjective:  Addylin Manke is a 40 y.o. [id number] at 30w6dbeing seen today for ongoing prenatal care.  She is currently monitored for the following issues for this high-risk pregnancy and has History of pre-eclampsia; Panic disorder with agoraphobia and moderate panic attacks; Chronic hypertension during pregnancy, antepartum; GE[place]iparity; Genital warts; Iron deficiency anemia; Migraine headache; Mixed hyperlipidemia; Moderate major depression ([facility] [place]; Supervision of high risk pregnancy, antepartum; and Trichomoniasis on their problem list.  Patient reports general discomforts of pregnancy. Still with vaginal discharge. No HA or visual changes .  Contractions: Irritability. Vag. Bleeding: None.  Movement: Present. Denies leaking of fluid.   The following portions of the patient's history were reviewed and updated as appropriate: allergies, current medications, past family history, past medical history, past social history, past surgical history and problem list. Problem list updated.  Objective:   Vitals:   05/13/21 0909 05/13/21 0915  BP: (!) 157/100 (!) 155/103  Pulse: 90 89  Weight: 208 lb 6.4 oz (94.5 kg)     Fetal Status: Fetal Heart Rate (bpm): 145   Movement: Present     General:  Alert, oriented and cooperative. Patient is in no acute distress.  Skin: Skin is warm and dry. No rash noted.   Cardiovascular: Normal heart rate noted  Respiratory: Normal respiratory effort, no problems with respiration noted  Abdomen: Soft, gravid, appropriate for gestational age. Pain/Pressure: Present     Pelvic:  Cervical exam deferred        Extremities: Normal range of motion.  Edema: Trace  Mental Status: Normal mood and affect. Normal behavior. Normal judgment and thought content.   Urinalysis:      Assessment and Plan:  Pregnancy: [id number] 257w6d1. Supervision of high risk pregnancy, antepartum  - Cervicovaginal ancillary only( [place]) - Glucose Tolerance, 2 Hours w/1  Hour - RPR - HIV antibody (with reflex) - CBC - Tdap vaccine greater than or equal to 7yo IM  2. Chronic hypertension during pregnancy, antepartum BP elevated. Pt reports BP at home has been running 150's/100's the last week Will check labs today Start Labetalol. BP check in 1 week PEC Sx reviewed with pt Serial growth scans as per MFM and antenatal testing starting at 32 weeks - Comp Met (CMET) - Protein / creatinine ratio, urine - labetalol (NORMODYNE) 200 MG tablet; Take 1 tablet (200 mg total) by mouth 2 (two) times daily.  Dispense: 60 tablet; Refill: 3  3. History of pre-eclampsia See above  Preterm labor symptoms and general obstetric precautions including but not limited to vaginal bleeding, contractions, leaking of fluid and fetal movement were reviewed in detail with the patient. Please refer to After Visit Summary for other counseling recommendations.  Return in about 2 weeks (around 05/27/2021) for OB visit, face to face, MD only.   ErChancy MilroyMD

## 2021-05-13 NOTE — Progress Notes (Signed)
Patient reports fetal movement with uterine irritability. Pt reports that she has been taking BP medication, denies headache, dizizness, blurred vision. Pt reports that she has not been able to keep down metronidazole due to vomiting.

## 2021-05-13 NOTE — Addendum Note (Signed)
Addended by: Natale Milch D on: 05/13/2021 11:57 AM   Modules accepted: Orders

## 2021-05-14 ENCOUNTER — Encounter: Payer: 59 | Admitting: Nurse Practitioner

## 2021-05-14 LAB — CERVICOVAGINAL ANCILLARY ONLY
Comment: NEGATIVE
Trichomonas: NEGATIVE

## 2021-05-14 LAB — CBC
Hematocrit: 32 % — ABNORMAL LOW (ref 34.0–46.6)
Hemoglobin: 10.5 g/dL — ABNORMAL LOW (ref 11.1–15.9)
MCH: 31.3 pg (ref 26.6–33.0)
MCHC: 32.8 g/dL (ref 31.5–35.7)
MCV: 96 fL (ref 79–97)
Platelets: 260 10*3/uL (ref 150–450)
RBC: 3.35 x10E6/uL — ABNORMAL LOW (ref 3.77–5.28)
RDW: 13.2 % (ref 11.7–15.4)
WBC: 12.5 10*3/uL — ABNORMAL HIGH (ref 3.4–10.8)

## 2021-05-14 LAB — COMPREHENSIVE METABOLIC PANEL
ALT: 6 IU/L (ref 0–32)
AST: 12 IU/L (ref 0–40)
Albumin/Globulin Ratio: 1.4 (ref 1.2–2.2)
Albumin: 3.7 g/dL — ABNORMAL LOW (ref 3.8–4.8)
Alkaline Phosphatase: 50 IU/L (ref 44–121)
BUN/Creatinine Ratio: 7 — ABNORMAL LOW (ref 9–23)
BUN: 4 mg/dL — ABNORMAL LOW (ref 6–24)
Bilirubin Total: 0.2 mg/dL (ref 0.0–1.2)
CO2: 19 mmol/L — ABNORMAL LOW (ref 20–29)
Calcium: 8.6 mg/dL — ABNORMAL LOW (ref 8.7–10.2)
Chloride: 100 mmol/L (ref 96–106)
Creatinine, Ser: 0.59 mg/dL (ref 0.57–1.00)
Globulin, Total: 2.7 g/dL (ref 1.5–4.5)
Glucose: 65 mg/dL (ref 65–99)
Potassium: 3.2 mmol/L — ABNORMAL LOW (ref 3.5–5.2)
Sodium: 134 mmol/L (ref 134–144)
Total Protein: 6.4 g/dL (ref 6.0–8.5)
eGFR: 117 mL/min/{1.73_m2} (ref >59)

## 2021-05-14 LAB — GLUCOSE TOLERANCE, 2 HOURS W/ 1HR
Glucose, 1 hour: 89 mg/dL (ref 65–179)
Glucose, 2 hour: 56 mg/dL — ABNORMAL LOW (ref 65–152)
Glucose, Fasting: 68 mg/dL (ref 65–91)

## 2021-05-14 LAB — RPR: RPR Ser Ql: NONREACTIVE

## 2021-05-14 LAB — PROTEIN / CREATININE RATIO, URINE
Creatinine, Urine: 77.1 mg/dL
Protein, Ur: 11.9 mg/dL
Protein/Creat Ratio: 154 mg/g creat (ref 0–200)

## 2021-05-14 LAB — HIV ANTIBODY (ROUTINE TESTING W REFLEX): HIV Screen 4th Generation wRfx: NONREACTIVE

## 2021-05-20 ENCOUNTER — Ambulatory Visit (INDEPENDENT_AMBULATORY_CARE_PROVIDER_SITE_OTHER): Payer: 59

## 2021-05-20 ENCOUNTER — Other Ambulatory Visit: Payer: Self-pay

## 2021-05-20 VITALS — BP 144/96 | HR 80

## 2021-05-20 DIAGNOSIS — O133 Gestational [pregnancy-induced] hypertension without significant proteinuria, third trimester: Secondary | ICD-10-CM

## 2021-05-20 DIAGNOSIS — Z0130 Encounter for examination of blood pressure without abnormal findings: Secondary | ICD-10-CM

## 2021-05-20 MED ORDER — NIFEDIPINE ER OSMOTIC RELEASE 60 MG PO TB24: 60.0000 mg | ORAL_TABLET | Freq: Every day | ORAL | 0 refills | Status: DC

## 2021-05-20 NOTE — Progress Notes (Signed)
Chart reviewed for nurse visit. Agree with plan of care.   Venora Maples, MD 05/20/21 3:43 PM

## 2021-05-20 NOTE — Progress Notes (Signed)
..  Subjective:  Paula Dominguez is a 40 y.o. female here for BP check.   Hypertension ROS: taking medications as instructed, no medication side effects noted, no TIA's, no chest pain on exertion, no dyspnea on exertion and no swelling of ankles.  Pt reports daily headaches.   Objective:  BP (!) 151/106   Pulse 82   LMP 10/23/2020   Appearance alert, well appearing, and in no distress. General exam BP noted to be well controlled today in office.    Assessment:   Blood Pressure needs improvement.   Plan:  Consulted w/ provider and increased Nifidepine to 60mg , pt agreed. Next appt is 05-27-21

## 2021-05-27 ENCOUNTER — Encounter: Payer: 59 | Admitting: Obstetrics & Gynecology

## 2021-06-02 ENCOUNTER — Ambulatory Visit: Payer: 59

## 2021-06-03 ENCOUNTER — Ambulatory Visit: Payer: 59 | Attending: Obstetrics | Admitting: *Deleted

## 2021-06-03 ENCOUNTER — Ambulatory Visit: Payer: 59 | Attending: Maternal & Fetal Medicine | Admitting: Maternal & Fetal Medicine

## 2021-06-03 ENCOUNTER — Ambulatory Visit (HOSPITAL_BASED_OUTPATIENT_CLINIC_OR_DEPARTMENT_OTHER): Payer: 59

## 2021-06-03 ENCOUNTER — Other Ambulatory Visit: Payer: Self-pay | Admitting: *Deleted

## 2021-06-03 ENCOUNTER — Encounter: Payer: Self-pay | Admitting: *Deleted

## 2021-06-03 ENCOUNTER — Other Ambulatory Visit: Payer: Self-pay

## 2021-06-03 VITALS — BP 138/83 | HR 88

## 2021-06-03 DIAGNOSIS — E669 Obesity, unspecified: Secondary | ICD-10-CM

## 2021-06-03 DIAGNOSIS — O99213 Obesity complicating pregnancy, third trimester: Secondary | ICD-10-CM | POA: Diagnosis not present

## 2021-06-03 DIAGNOSIS — Z3A30 30 weeks gestation of pregnancy: Secondary | ICD-10-CM | POA: Insufficient documentation

## 2021-06-03 DIAGNOSIS — O09523 Supervision of elderly multigravida, third trimester: Secondary | ICD-10-CM | POA: Diagnosis not present

## 2021-06-03 DIAGNOSIS — O10919 Unspecified pre-existing hypertension complicating pregnancy, unspecified trimester: Secondary | ICD-10-CM

## 2021-06-03 DIAGNOSIS — O0990 Supervision of high risk pregnancy, unspecified, unspecified trimester: Secondary | ICD-10-CM

## 2021-06-03 DIAGNOSIS — O09293 Supervision of pregnancy with other poor reproductive or obstetric history, third trimester: Secondary | ICD-10-CM | POA: Insufficient documentation

## 2021-06-03 DIAGNOSIS — O10913 Unspecified pre-existing hypertension complicating pregnancy, third trimester: Secondary | ICD-10-CM | POA: Insufficient documentation

## 2021-06-03 DIAGNOSIS — Z8759 Personal history of other complications of pregnancy, childbirth and the puerperium: Secondary | ICD-10-CM

## 2021-06-03 DIAGNOSIS — O10013 Pre-existing essential hypertension complicating pregnancy, third trimester: Secondary | ICD-10-CM | POA: Diagnosis not present

## 2021-06-04 ENCOUNTER — Encounter: Payer: Self-pay | Admitting: Family Medicine

## 2021-06-04 ENCOUNTER — Other Ambulatory Visit: Payer: Self-pay | Admitting: *Deleted

## 2021-06-04 ENCOUNTER — Ambulatory Visit (INDEPENDENT_AMBULATORY_CARE_PROVIDER_SITE_OTHER): Payer: 59 | Admitting: Family Medicine

## 2021-06-04 VITALS — BP 132/89 | HR 93 | Wt 206.0 lb

## 2021-06-04 DIAGNOSIS — O0990 Supervision of high risk pregnancy, unspecified, unspecified trimester: Secondary | ICD-10-CM

## 2021-06-04 DIAGNOSIS — O10913 Unspecified pre-existing hypertension complicating pregnancy, third trimester: Secondary | ICD-10-CM

## 2021-06-04 DIAGNOSIS — Z641 Problems related to multiparity: Secondary | ICD-10-CM

## 2021-06-04 DIAGNOSIS — Z8759 Personal history of other complications of pregnancy, childbirth and the puerperium: Secondary | ICD-10-CM

## 2021-06-04 DIAGNOSIS — O10919 Unspecified pre-existing hypertension complicating pregnancy, unspecified trimester: Secondary | ICD-10-CM

## 2021-06-04 NOTE — Progress Notes (Signed)
MFM Brief Notes  Ms. Paula Dominguez is a G9P7 who is here for follow up growth due to advanced maternal age and chronic hypertension on medication  Normal interval growth with measurements consistent with dates Good fetal movement and amniotic fluid volume  Biophysical profile 8/8  Ms. Paula Dominguez reports that her blood pressure has been elevated at home. In addition, she has had a headache that eventually resolves over a few days with rest and blood pressure improvement. She reports that her blood pressure has been as high as 209/111 mmHg. Today's blood pressure is 138/83 mmHg. She is taking Procardia 60 mg daily and Labetalol 200 mg BID  She denies headache or vision changes today. Her most recent labs are normal including CBC, CMP and UPC.  I recommended to Ms. Paula Dominguez that if she develops a persistent headache with elevated blood pressure above 160/110 she should go to [facility] for evaluation and labs.   Continue weekly testing with repeat growth in 3-4 weeks. Consider delivery by 37 weeks given elevated blood pressure.  I spent 20 minutes with > 50% in face to face consultation.  All questions answered.  Novella Olive, MD.

## 2021-06-04 NOTE — Patient Instructions (Signed)

## 2021-06-04 NOTE — Progress Notes (Signed)
   Subjective:  Paula Dominguez is a 40 y.o. K1S0109 at [redacted]w[redacted]d being seen today for ongoing prenatal care.  She is currently monitored for the following issues for this high-risk pregnancy and has History of pre-eclampsia; Panic disorder with agoraphobia and moderate panic attacks; Chronic hypertension during pregnancy, antepartum; Grand multiparity; Genital warts; Iron deficiency anemia; Migraine headache; Mixed hyperlipidemia; Moderate major depression (HCC); Supervision of high risk pregnancy, antepartum; and Trichomoniasis on their problem list.  Patient reports no complaints.  Contractions: Not present. Vag. Bleeding: None.  Movement: Present. Denies leaking of fluid.   The following portions of the patient's history were reviewed and updated as appropriate: allergies, current medications, past family history, past medical history, past social history, past surgical history and problem list. Problem list updated.  Objective:   Vitals:   06/04/21 0928  BP: 132/89  Pulse: 93  Weight: 206 lb (93.4 kg)    Fetal Status: Fetal Heart Rate (bpm): 137   Movement: Present     General:  Alert, oriented and cooperative. Patient is in no acute distress.  Skin: Skin is warm and dry. No rash noted.   Cardiovascular: Normal heart rate noted  Respiratory: Normal respiratory effort, no problems with respiration noted  Abdomen: Soft, gravid, appropriate for gestational age. Pain/Pressure: Absent     Pelvic: Vag. Bleeding: None     Cervical exam deferred        Extremities: Normal range of motion.     Mental Status: Normal mood and affect. Normal behavior. Normal judgment and thought content.   Urinalysis:      Assessment and Plan:  Pregnancy: [id number] at [redacted]w[redacted]d  1. Supervision of high risk pregnancy, antepartum BP and FHR normal Uncertain about BTL, partner planning vasectomy but does not have an appt yet Encouraged her to consider back up, Nexplanon/Depo/etc.  2. Chronic hypertension during  pregnancy, antepartum BP well controlled on Nifedipine 60 and Labetalol 200 BID Following w MFM, growth [place] yesterday and antenatal testing normal Has weekly antenatal testing already scheduled  3. History of pre-eclampsia On ASA, see above  4. Grand multiparity P7  Preterm labor symptoms and general obstetric precautions including but not limited to vaginal bleeding, contractions, leaking of fluid and fetal movement were reviewed in detail with the patient. Please refer to After Visit Summary for other counseling recommendations.  Return in about 2 weeks (around 06/18/2021) for [facility], ob visit.   Venora Maples, MD

## 2021-06-09 ENCOUNTER — Ambulatory Visit: Payer: 59

## 2021-06-09 ENCOUNTER — Other Ambulatory Visit: Payer: Self-pay

## 2021-06-09 ENCOUNTER — Ambulatory Visit: Payer: 59 | Admitting: *Deleted

## 2021-06-09 ENCOUNTER — Other Ambulatory Visit: Payer: Self-pay | Admitting: Maternal & Fetal Medicine

## 2021-06-09 ENCOUNTER — Ambulatory Visit: Payer: 59 | Attending: Maternal & Fetal Medicine

## 2021-06-09 VITALS — BP 135/87 | HR 97

## 2021-06-09 DIAGNOSIS — O09293 Supervision of pregnancy with other poor reproductive or obstetric history, third trimester: Secondary | ICD-10-CM

## 2021-06-09 DIAGNOSIS — Z3A31 31 weeks gestation of pregnancy: Secondary | ICD-10-CM

## 2021-06-09 DIAGNOSIS — O0990 Supervision of high risk pregnancy, unspecified, unspecified trimester: Secondary | ICD-10-CM | POA: Insufficient documentation

## 2021-06-09 DIAGNOSIS — E669 Obesity, unspecified: Secondary | ICD-10-CM

## 2021-06-09 DIAGNOSIS — O10913 Unspecified pre-existing hypertension complicating pregnancy, third trimester: Secondary | ICD-10-CM | POA: Insufficient documentation

## 2021-06-09 DIAGNOSIS — O10013 Pre-existing essential hypertension complicating pregnancy, third trimester: Secondary | ICD-10-CM | POA: Diagnosis not present

## 2021-06-09 DIAGNOSIS — O09523 Supervision of elderly multigravida, third trimester: Secondary | ICD-10-CM

## 2021-06-09 DIAGNOSIS — O99213 Obesity complicating pregnancy, third trimester: Secondary | ICD-10-CM

## 2021-06-09 DIAGNOSIS — Z8759 Personal history of other complications of pregnancy, childbirth and the puerperium: Secondary | ICD-10-CM

## 2021-06-17 ENCOUNTER — Other Ambulatory Visit: Payer: Self-pay | Admitting: *Deleted

## 2021-06-17 ENCOUNTER — Ambulatory Visit: Payer: 59 | Admitting: *Deleted

## 2021-06-17 ENCOUNTER — Ambulatory Visit: Payer: 59 | Attending: Maternal & Fetal Medicine

## 2021-06-17 ENCOUNTER — Encounter: Payer: Self-pay | Admitting: *Deleted

## 2021-06-17 ENCOUNTER — Other Ambulatory Visit: Payer: Self-pay

## 2021-06-17 ENCOUNTER — Ambulatory Visit (HOSPITAL_BASED_OUTPATIENT_CLINIC_OR_DEPARTMENT_OTHER): Payer: 59 | Admitting: *Deleted

## 2021-06-17 ENCOUNTER — Ambulatory Visit: Payer: 59

## 2021-06-17 VITALS — BP 132/82 | HR 90

## 2021-06-17 DIAGNOSIS — O09523 Supervision of elderly multigravida, third trimester: Secondary | ICD-10-CM

## 2021-06-17 DIAGNOSIS — Z8759 Personal history of other complications of pregnancy, childbirth and the puerperium: Secondary | ICD-10-CM

## 2021-06-17 DIAGNOSIS — E669 Obesity, unspecified: Secondary | ICD-10-CM

## 2021-06-17 DIAGNOSIS — O0990 Supervision of high risk pregnancy, unspecified, unspecified trimester: Secondary | ICD-10-CM | POA: Diagnosis present

## 2021-06-17 DIAGNOSIS — O10913 Unspecified pre-existing hypertension complicating pregnancy, third trimester: Secondary | ICD-10-CM | POA: Diagnosis present

## 2021-06-17 DIAGNOSIS — O99213 Obesity complicating pregnancy, third trimester: Secondary | ICD-10-CM

## 2021-06-17 DIAGNOSIS — Z3A32 32 weeks gestation of pregnancy: Secondary | ICD-10-CM

## 2021-06-17 DIAGNOSIS — O10013 Pre-existing essential hypertension complicating pregnancy, third trimester: Secondary | ICD-10-CM | POA: Diagnosis not present

## 2021-06-17 DIAGNOSIS — O10919 Unspecified pre-existing hypertension complicating pregnancy, unspecified trimester: Secondary | ICD-10-CM

## 2021-06-17 DIAGNOSIS — O09293 Supervision of pregnancy with other poor reproductive or obstetric history, third trimester: Secondary | ICD-10-CM

## 2021-06-17 NOTE — Procedures (Signed)
Noheli Melder 07-07-1981 [redacted]w[redacted]d  Fetus A Non-Stress Test Interpretation for 06/17/21  Indication: Advanced Maternal Age >40 years  Fetal Heart Rate A Mode: External Baseline Rate (A): 140 bpm Variability: Minimal Accelerations: 15 x 15 Decelerations: None Multiple birth?: No  Uterine Activity Mode: Palpation, Toco Contraction Frequency (min): none Resting Tone Palpated: Relaxed Resting Time: Adequate  Interpretation (Fetal Testing) Nonstress Test Interpretation: Reactive Overall Impression: Reassuring for gestational age Comments: Dr. Grace Bushy reviewed tracing

## 2021-06-18 ENCOUNTER — Encounter: Payer: 59 | Admitting: Family Medicine

## 2021-06-18 ENCOUNTER — Other Ambulatory Visit: Payer: Self-pay

## 2021-06-18 DIAGNOSIS — O26893 Other specified pregnancy related conditions, third trimester: Secondary | ICD-10-CM | POA: Insufficient documentation

## 2021-06-18 DIAGNOSIS — I10 Essential (primary) hypertension: Secondary | ICD-10-CM | POA: Insufficient documentation

## 2021-06-18 DIAGNOSIS — O98513 Other viral diseases complicating pregnancy, third trimester: Secondary | ICD-10-CM | POA: Insufficient documentation

## 2021-06-18 DIAGNOSIS — R519 Headache, unspecified: Secondary | ICD-10-CM | POA: Insufficient documentation

## 2021-06-18 DIAGNOSIS — Z7982 Long term (current) use of aspirin: Secondary | ICD-10-CM | POA: Diagnosis not present

## 2021-06-18 DIAGNOSIS — M549 Dorsalgia, unspecified: Secondary | ICD-10-CM | POA: Diagnosis not present

## 2021-06-18 DIAGNOSIS — Z3A33 33 weeks gestation of pregnancy: Secondary | ICD-10-CM | POA: Insufficient documentation

## 2021-06-18 DIAGNOSIS — Z79899 Other long term (current) drug therapy: Secondary | ICD-10-CM | POA: Insufficient documentation

## 2021-06-18 DIAGNOSIS — Z87891 Personal history of nicotine dependence: Secondary | ICD-10-CM | POA: Insufficient documentation

## 2021-06-18 DIAGNOSIS — U071 COVID-19: Secondary | ICD-10-CM | POA: Diagnosis present

## 2021-06-18 DIAGNOSIS — O4703 False labor before 37 completed weeks of gestation, third trimester: Secondary | ICD-10-CM | POA: Diagnosis not present

## 2021-06-18 NOTE — ED Triage Notes (Signed)
Pt c/o headache and some contractions at this time. Pt is G8P7. Pt denies any bleeding, discharge, or fluid at this time. Pt states that she has two kids at home that tested positive for COVID. Pt aaox3, ambulatory with steady gait, GCS 15.

## 2021-06-19 ENCOUNTER — Encounter (HOSPITAL_BASED_OUTPATIENT_CLINIC_OR_DEPARTMENT_OTHER): Payer: Self-pay | Admitting: Emergency Medicine

## 2021-06-19 ENCOUNTER — Inpatient Hospital Stay (HOSPITAL_BASED_OUTPATIENT_CLINIC_OR_DEPARTMENT_OTHER)
Admission: EM | Admit: 2021-06-19 | Discharge: 2021-06-19 | Disposition: A | Discharge status: Home / Self Care | Payer: 59 | Attending: Obstetrics & Gynecology | Admitting: Obstetrics & Gynecology

## 2021-06-19 DIAGNOSIS — Z3A33 33 weeks gestation of pregnancy: Secondary | ICD-10-CM

## 2021-06-19 DIAGNOSIS — M549 Dorsalgia, unspecified: Secondary | ICD-10-CM | POA: Diagnosis not present

## 2021-06-19 DIAGNOSIS — U071 COVID-19: Secondary | ICD-10-CM

## 2021-06-19 DIAGNOSIS — O98513 Other viral diseases complicating pregnancy, third trimester: Secondary | ICD-10-CM | POA: Diagnosis not present

## 2021-06-19 DIAGNOSIS — Z79899 Other long term (current) drug therapy: Secondary | ICD-10-CM | POA: Diagnosis not present

## 2021-06-19 DIAGNOSIS — R519 Headache, unspecified: Secondary | ICD-10-CM | POA: Diagnosis not present

## 2021-06-19 DIAGNOSIS — I10 Essential (primary) hypertension: Secondary | ICD-10-CM | POA: Diagnosis not present

## 2021-06-19 DIAGNOSIS — O26893 Other specified pregnancy related conditions, third trimester: Secondary | ICD-10-CM | POA: Diagnosis not present

## 2021-06-19 DIAGNOSIS — Z3689 Encounter for other specified antenatal screening: Secondary | ICD-10-CM

## 2021-06-19 DIAGNOSIS — O99891 Other specified diseases and conditions complicating pregnancy: Secondary | ICD-10-CM

## 2021-06-19 DIAGNOSIS — Z7982 Long term (current) use of aspirin: Secondary | ICD-10-CM | POA: Diagnosis not present

## 2021-06-19 DIAGNOSIS — O4703 False labor before 37 completed weeks of gestation, third trimester: Secondary | ICD-10-CM

## 2021-06-19 DIAGNOSIS — Z87891 Personal history of nicotine dependence: Secondary | ICD-10-CM | POA: Diagnosis not present

## 2021-06-19 LAB — COMPREHENSIVE METABOLIC PANEL
ALT: 10 U/L (ref 0–44)
AST: 18 U/L (ref 15–41)
Albumin: 3 g/dL — ABNORMAL LOW (ref 3.5–5.0)
Alkaline Phosphatase: 51 U/L (ref 38–126)
Anion gap: 7 (ref 5–15)
BUN: <5 mg/dL — ABNORMAL LOW (ref 6–20)
CO2: 21 mmol/L — ABNORMAL LOW (ref 22–32)
Calcium: 8.2 mg/dL — ABNORMAL LOW (ref 8.9–10.3)
Chloride: 104 mmol/L (ref 98–111)
Creatinine, Ser: 0.46 mg/dL (ref 0.44–1.00)
GFR, Estimated: >60 mL/min (ref >60)
Glucose, Bld: 96 mg/dL (ref 70–99)
Potassium: 3.3 mmol/L — ABNORMAL LOW (ref 3.5–5.1)
Sodium: 132 mmol/L — ABNORMAL LOW (ref 135–145)
Total Bilirubin: 0.2 mg/dL — ABNORMAL LOW (ref 0.3–1.2)
Total Protein: 6.5 g/dL (ref 6.5–8.1)

## 2021-06-19 LAB — CBC WITH DIFFERENTIAL/PLATELET
Abs Immature Granulocytes: 0.3 10*3/uL — ABNORMAL HIGH (ref 0.00–0.07)
Basophils Absolute: 0.1 10*3/uL (ref 0.0–0.1)
Basophils Relative: 1 %
Eosinophils Absolute: 0.1 10*3/uL (ref 0.0–0.5)
Eosinophils Relative: 1 %
HCT: 30.9 % — ABNORMAL LOW (ref 36.0–46.0)
Hemoglobin: 10.4 g/dL — ABNORMAL LOW (ref 12.0–15.0)
Immature Granulocytes: 3 %
Lymphocytes Relative: 8 %
Lymphs Abs: 0.7 10*3/uL (ref 0.7–4.0)
MCH: 31.7 pg (ref 26.0–34.0)
MCHC: 33.7 g/dL (ref 30.0–36.0)
MCV: 94.2 fL (ref 80.0–100.0)
Monocytes Absolute: 0.7 10*3/uL (ref 0.1–1.0)
Monocytes Relative: 8 %
Neutro Abs: 7 10*3/uL (ref 1.7–7.7)
Neutrophils Relative %: 79 %
Platelets: 194 10*3/uL (ref 150–400)
RBC: 3.28 MIL/uL — ABNORMAL LOW (ref 3.87–5.11)
RDW: 12.8 % (ref 11.5–15.5)
WBC: 8.9 10*3/uL (ref 4.0–10.5)
nRBC: 0 % (ref 0.0–0.2)

## 2021-06-19 LAB — WET PREP, GENITAL
Sperm: NONE SEEN
Trich, Wet Prep: NONE SEEN

## 2021-06-19 LAB — RESP PANEL BY RT-PCR (FLU A&B, COVID) ARPGX2
Influenza A by PCR: NEGATIVE
Influenza B by PCR: NEGATIVE
SARS Coronavirus 2 by RT PCR: POSITIVE — AB

## 2021-06-19 MED ORDER — CYCLOBENZAPRINE HCL 5 MG PO TABS
10.0000 mg | ORAL_TABLET | Freq: Once | ORAL | Status: AC
  Administered 2021-06-19: 10 mg via ORAL
  Filled 2021-06-19: qty 2

## 2021-06-19 MED ORDER — TERCONAZOLE 0.4 % VA CREA: 1.0000 | TOPICAL_CREAM | Freq: Every day | VAGINAL | 0 refills | Status: DC

## 2021-06-19 MED ORDER — ACETAMINOPHEN 500 MG PO TABS
1000.0000 mg | ORAL_TABLET | Freq: Once | ORAL | Status: AC
  Administered 2021-06-19: 1000 mg via ORAL
  Filled 2021-06-19: qty 2

## 2021-06-19 MED ORDER — LACTATED RINGERS IV BOLUS
500.0000 mL | Freq: Once | INTRAVENOUS | Status: AC
  Administered 2021-06-19: 500 mL via INTRAVENOUS

## 2021-06-19 MED ORDER — LACTATED RINGERS IV BOLUS
1000.0000 mL | Freq: Once | INTRAVENOUS | Status: AC
  Administered 2021-06-19: 1000 mL via INTRAVENOUS

## 2021-06-19 MED ORDER — METRONIDAZOLE 500 MG PO TABS: 500.0000 mg | ORAL_TABLET | Freq: Two times a day (BID) | ORAL | 0 refills | Status: DC

## 2021-06-19 MED ORDER — BUTORPHANOL TARTRATE 2 MG/ML IJ SOLN
1.0000 mg | Freq: Once | INTRAMUSCULAR | Status: AC
  Administered 2021-06-19: 1 mg via INTRAVENOUS
  Filled 2021-06-19: qty 1

## 2021-06-19 NOTE — ED Notes (Signed)
Spoke to [place], Charity fundraiser with [facility]. Stated that she would contact OB Dr and ask about next step for pt. And would call back shortly with updated.

## 2021-06-19 NOTE — ED Notes (Signed)
Pt placed on OB monitor, secretary paged [facility] for monitoring pt due to pt stating she was having contractions at this time.

## 2021-06-19 NOTE — Progress Notes (Signed)
[facility] RN called Associate Professor back to give new orders for fluid bolus.  Ariel RN mentions this to the patient and the patient states she just wants a covid test and will follow up with her OB at her appointment on Monday.   [facility] RN called Dr.Arnold back at 0056 with this update. Dr.Arnold states that is up to her and if she wants to leave she can.   [facility] RN informs Associate Professor to tell the patient that if these contractions intensify and she actually thinks she is in preterm labor she should come to [facility] asap to be evaluated.   FHR tracing remains category 1.

## 2021-06-19 NOTE — ED Provider Notes (Signed)
[facility] Provider Note: Lowella Dell, MD, FACEP  CSN: [id number] MRN: [id number] ARRIVAL: 06/18/21 at 2345 ROOM: MHT14/MHT14   CHIEF COMPLAINT  Generalized Body Aches   HISTORY OF PRESENT ILLNESS  06/19/21 12:59 AM Paula Dominguez is a 40 y.o. female who is about 8 months pregnant.  She is here concerned that she may have COVID.  She states 2 children at home with tested positive.  She has been having low back pain, chills and what she describes as contractions since yesterday.  She also had nausea and vomiting yesterday.  She states the pain in her back is a 9 out of 10, aching in nature.  She denies back injury.  She is not having dysuria, vaginal bleeding or vaginal discharge.  She states the contractions feel like labor.  She has not had a fever or respiratory symptoms.  She states she is only here to find out if she is COVID-positive.   Past Medical History:  Diagnosis Date   Anemia    Anxiety and depression    Blood transfusion without reported diagnosis    GERD (gastroesophageal reflux disease)    Hx of preeclampsia, prior pregnancy, currently pregnant    prior pregnancy   Hypertension     Past Surgical History:  Procedure Laterality Date   HEMORRHOID SURGERY      Family History  Problem Relation Age of Onset   Healthy Mother    Healthy Father    Hypertension Other    Cancer Other     Social History   Tobacco Use   Smoking status: Former    Packs/day: 1.00    Years: 17.00    Pack years: 17.00    Types: Cigarettes    Quit date: 07/27/2013    Years since quitting: 7.9   Smokeless tobacco: Never  Vaping Use   Vaping Use: Former   Substances: Nicotine, Flavoring  Substance Use Topics   Alcohol use: No   Drug use: No    Prior to Admission medications   Medication Sig Start Date End Date Taking? Authorizing Provider  aspirin 81 MG chewable tablet Chew 1 tablet (81 mg total) by mouth daily. 02/11/21   Aviva Signs, CNM   cyclobenzaprine (FLEXERIL) 10 MG tablet Take 1 tablet (10 mg total) by mouth every 8 (eight) hours as needed for muscle spasms. 04/16/21   Anyanwu, Jethro Bastos, MD  labetalol (NORMODYNE) 200 MG tablet Take 1 tablet (200 mg total) by mouth 2 (two) times daily. 05/13/21   Hermina Staggers, MD  NIFEdipine (PROCARDIA XL/NIFEDICAL XL) 60 MG 24 hr tablet Take 1 tablet (60 mg total) by mouth daily. 05/20/21   Venora Maples, MD  omeprazole (PRILOSEC) 40 MG capsule Take 1 capsule (40 mg total) by mouth daily. 04/16/21   Anyanwu, Jethro Bastos, MD  ondansetron (ZOFRAN ODT) 4 MG disintegrating tablet Take 1 tablet (4 mg total) by mouth every 6 (six) hours as needed for nausea. If Phenergan doesn't work 04/16/21   Tereso Newcomer, MD  Prenatal Vit-Fe Fumarate-FA (PNV PRENATAL PLUS MULTIVITAMIN) 27-1 MG TABS Take 1 capsule by mouth daily. 02/26/21   Conan Bowens, MD  promethazine (PHENERGAN) 25 MG suppository Place 1 suppository (25 mg total) rectally every 6 (six) hours as needed for nausea or vomiting. 04/16/21   Anyanwu, Jethro Bastos, MD    Allergies Patient has no known allergies.   REVIEW OF SYSTEMS  Negative except as noted here or in the History of Present  Illness.   PHYSICAL EXAMINATION  Initial Vital Signs Blood pressure (!) 138/93, pulse 92, temperature 98.6 F (37 C), temperature source Oral, resp. rate 18, height 5\' 5"  (1.651 m), weight 94.8 kg, last menstrual period 10/23/2020, SpO2 98 %.  Examination General: Well-developed, well-nourished female in no acute distress; appearance consistent with age of record HENT: normocephalic; atraumatic Eyes: Normal appearance Neck: supple Heart: regular rate and rhythm Lungs: clear to auscultation bilaterally Abdomen: soft; gravid, consistent with dates; nontender Extremities: No deformity; full range of motion; pulses normal Neurologic: Awake, alert and oriented; motor function intact in all extremities and symmetric; no facial droop Skin: Warm and  dry Psychiatric: Normal mood and affect   RESULTS  Summary of this visit's results, reviewed and interpreted by myself:   EKG Interpretation  Date/Time:    Ventricular Rate:    PR Interval:    QRS Duration:   QT Interval:    QTC Calculation:   R Axis:     Text Interpretation:          Laboratory Studies: Results for orders placed or performed during the hospital encounter of 06/19/21 (from the past 24 hour(s))  Resp Panel by RT-PCR (Flu A&B, Covid) Nasopharyngeal Swab     Status: Abnormal   Collection Time: 06/19/21 12:19 AM   Specimen: Nasopharyngeal Swab; Nasopharyngeal(NP) swabs in vial transport medium  Result Value Ref Range   SARS Coronavirus 2 by RT PCR POSITIVE (A) NEGATIVE   Influenza A by PCR NEGATIVE NEGATIVE   Influenza B by PCR NEGATIVE NEGATIVE   Imaging Studies:   ED COURSE and MDM  Nursing notes, initial and subsequent vitals signs, including pulse oximetry, reviewed and interpreted by myself.  Vitals:   06/19/21 0001 06/19/21 0002 06/19/21 0022  BP:  133/89 (!) 138/93  Pulse:  (!) 104 92  Resp:  20 18  Temp:  98.6 F (37 C)   TempSrc:  Oral   SpO2:  98% 98%  Weight: 94.8 kg    Height: 5\' 5"  (1.651 m)     Medications  lactated ringers bolus 500 mL (has no administration in time range)  butorphanol (STADOL) injection 1 mg (has no administration in time range)   1:07 AM The patient has been on the toco monitor being followed at [facility].  [facility] requested that we give a 500 mL IV fluid bolus to help differentiate the type of contraction she is having.  The patient declined an IV fluid bolus because she states that if she is going into labor she does not want to do anything to stop it.   1:25 AM The patient has changed her mind and would like to be transferred to [facility]U.  She was advised that she is positive for COVID.  1:35 AM Cervix is closed, thick and high.  Patient denies her water having broken.  No bloody show on examining glove.  Patient excepted  for transfer to [facility]U.  Dr. 06/21/21 is the accepting physician.   PROCEDURES  Procedures CRITICAL CARE Performed by: Boaz Berisha Total critical care time: 30 minutes Critical care time was exclusive of separately billable procedures and treating other patients. Critical care was necessary to treat or prevent imminent or life-threatening deterioration. Critical care was time spent personally by me on the following activities: development of treatment plan with patient and/or surrogate as well as nursing, discussions with consultants, evaluation of patient's response to treatment, examination of patient, obtaining history from patient or surrogate, ordering and performing treatments and interventions,  ordering and review of laboratory studies, ordering and review of radiographic studies, pulse oximetry and re-evaluation of patient's condition.   ED DIAGNOSES     ICD-10-CM   1. Preterm labor in third trimester without delivery  O60.03     2. COVID-19 virus infection  U07.1          Kiyanna Biegler, Jonny Ruiz, MD 06/19/21 [phone]

## 2021-06-19 NOTE — MAU Note (Signed)
Pt ambulatory to bathroom. gait steady.

## 2021-06-19 NOTE — MAU Provider Note (Signed)
History     CSN: [id number]  Arrival date and time: 06/18/21 2345   Event Date/Time   First Provider Initiated Contact with Patient 06/19/21 0331      Chief Complaint  Patient presents with   Generalized Body Aches   Covid Positive   Headache   Paula Dominguez is a 40 y.o. [id number] at [redacted]w[redacted]d who receives care at [facility].  She presents today for Contractions.  She states she started contracting yesterday and now they are about every minute.  She endorses fetal movement and denies vaginal bleeding or discharge.  She states she has not had a full term delivery, but endorses that all her children were born after 37 weeks.  Of note patient was recently diagnosed with Covid 19 and her symptoms worsened while at the hospital.    OB History     Gravida  9   Para  7   Term  7   Preterm  0   AB  1   Living  7      SAB  1   IAB      Ectopic      Multiple  0   Live Births  7           Past Medical History:  Diagnosis Date   Anemia    Anxiety and depression    Blood transfusion without reported diagnosis    2014, 2018   GERD (gastroesophageal reflux disease)    Hx of preeclampsia, prior pregnancy, currently pregnant    prior pregnancy   Hypertension     Past Surgical History:  Procedure Laterality Date   HEMORRHOID SURGERY      Family History  Problem Relation Age of Onset   Healthy Mother    Healthy Father    Hypertension Other    Cancer Other     Social History   Tobacco Use   Smoking status: Former    Packs/day: 1.00    Years: 17.00    Pack years: 17.00    Types: Cigarettes    Quit date: 07/27/2013    Years since quitting: 7.9   Smokeless tobacco: Never  Vaping Use   Vaping Use: Former   Substances: Nicotine, Flavoring  Substance Use Topics   Alcohol use: No   Drug use: No    Allergies: No Known Allergies  Medications Prior to Admission  Medication Sig Dispense Refill Last Dose   aspirin 81 MG chewable tablet Chew 1 tablet (81 mg  total) by mouth daily. 30 tablet 5 06/18/2021 at 0730   cyclobenzaprine (FLEXERIL) 10 MG tablet Take 1 tablet (10 mg total) by mouth every 8 (eight) hours as needed for muscle spasms. 30 tablet 1 06/18/2021 at 0730   labetalol (NORMODYNE) 200 MG tablet Take 1 tablet (200 mg total) by mouth 2 (two) times daily. 60 tablet 3 06/18/2021 at 0730   NIFEdipine (PROCARDIA XL/NIFEDICAL XL) 60 MG 24 hr tablet Take 1 tablet (60 mg total) by mouth daily. 30 tablet 0 06/18/2021 at 0730   omeprazole (PRILOSEC) 40 MG capsule Take 1 capsule (40 mg total) by mouth daily. 60 capsule 4 06/18/2021   Prenatal Vit-Fe Fumarate-FA (PNV PRENATAL PLUS MULTIVITAMIN) 27-1 MG TABS Take 1 capsule by mouth daily. 30 tablet 11 06/18/2021 at 0730   promethazine (PHENERGAN) 25 MG suppository Place 1 suppository (25 mg total) rectally every 6 (six) hours as needed for nausea or vomiting. 12 each 0 06/18/2021 at 1700   ondansetron (ZOFRAN ODT) 4 MG  disintegrating tablet Take 1 tablet (4 mg total) by mouth every 6 (six) hours as needed for nausea. If Phenergan doesn't work 20 tablet 2     Review of Systems  Gastrointestinal:  Positive for abdominal pain (Lower abdomen "just hurting"). Negative for nausea and vomiting.  Genitourinary:  Negative for difficulty urinating, dysuria, vaginal bleeding and vaginal discharge.  Musculoskeletal:  Positive for back pain.  Physical Exam   Blood pressure 135/90, pulse 98, temperature 98.3 F (36.8 C), temperature source Oral, resp. rate 20, height 5\' 5"  (1.651 m), weight 94.8 kg, last menstrual period 10/23/2020, SpO2 96 %.  Physical Exam Vitals reviewed. Exam conducted with a chaperone present.  Constitutional:      Appearance: She is well-developed.  HENT:     Head: Normocephalic and atraumatic.  Eyes:     Conjunctiva/sclera: Conjunctivae normal.  Cardiovascular:     Rate and Rhythm: Normal rate.  Pulmonary:     Effort: Pulmonary effort is normal. No respiratory distress.  Abdominal:      Palpations: Abdomen is soft.     Comments: Gravid, Appears AGA  Genitourinary:    Comments: Speculum Exam: -Normal External Genitalia: Non tender, no apparent discharge at introitus.  -Vaginal Vault: Pink mucosa with good rugae. Vaginal wall prolapse noted. Moderate amt curdy white discharge noted -wet prep and GC/CT collected -Cervix:Unable to visualize- -Bimanual Exam: Dilation: 1 Effacement (%): Thick Cervical Position: Posterior Exam by:: [email or web address] CNM ' Musculoskeletal:        General: Normal range of motion.  Skin:    General: Skin is warm and dry.  Neurological:     Mental Status: She is alert and oriented to person, place, and time.  Psychiatric:        Mood and Affect: Mood normal.        Behavior: Behavior normal.     Fetal Assessment 135 bpm, Mod Var, -Decels, +Accels Toco: Q79min  MAU Course   Results for orders placed or performed during the hospital encounter of 06/19/21 (from the past 24 hour(s))  Resp Panel by RT-PCR (Flu A&B, Covid) Nasopharyngeal Swab     Status: Abnormal   Collection Time: 06/19/21 12:19 AM   Specimen: Nasopharyngeal Swab; Nasopharyngeal(NP) swabs in vial transport medium  Result Value Ref Range   SARS Coronavirus 2 by RT PCR POSITIVE (A) NEGATIVE   Influenza A by PCR NEGATIVE NEGATIVE   Influenza B by PCR NEGATIVE NEGATIVE  CBC with Differential     Status: Abnormal   Collection Time: 06/19/21  1:28 AM  Result Value Ref Range   WBC 8.9 4.0 - 10.5 K/uL   RBC 3.28 (L) 3.87 - 5.11 MIL/uL   Hemoglobin 10.4 (L) 12.0 - 15.0 g/dL   HCT 06/21/21 (L) 19.1 - 47.8 %   MCV 94.2 80.0 - 100.0 fL   MCH 31.7 26.0 - 34.0 pg   MCHC 33.7 30.0 - 36.0 g/dL   RDW 29.5 62.1 - 30.8 %   Platelets 194 150 - 400 K/uL   nRBC 0.0 0.0 - 0.2 %   Neutrophils Relative % 79 %   Neutro Abs 7.0 1.7 - 7.7 K/uL   Lymphocytes Relative 8 %   Lymphs Abs 0.7 0.7 - 4.0 K/uL   Monocytes Relative 8 %   Monocytes Absolute 0.7 0.1 - 1.0 K/uL   Eosinophils Relative 1 %    Eosinophils Absolute 0.1 0.0 - 0.5 K/uL   Basophils Relative 1 %   Basophils Absolute 0.1 0.0 - 0.1 K/uL  Immature Granulocytes 3 %   Abs Immature Granulocytes 0.30 (H) 0.00 - 0.07 K/uL  Comprehensive metabolic panel     Status: Abnormal   Collection Time: 06/19/21  1:28 AM  Result Value Ref Range   Sodium 132 (L) 135 - 145 mmol/L   Potassium 3.3 (L) 3.5 - 5.1 mmol/L   Chloride 104 98 - 111 mmol/L   CO2 21 (L) 22 - 32 mmol/L   Glucose, Bld 96 70 - 99 mg/dL   BUN <5 (L) 6 - 20 mg/dL   Creatinine, Ser 9.60 0.44 - 1.00 mg/dL   Calcium 8.2 (L) 8.9 - 10.3 mg/dL   Total Protein 6.5 6.5 - 8.1 g/dL   Albumin 3.0 (L) 3.5 - 5.0 g/dL   AST 18 15 - 41 U/L   ALT 10 0 - 44 U/L   Alkaline Phosphatase 51 38 - 126 U/L   Total Bilirubin 0.2 (L) 0.3 - 1.2 mg/dL   GFR, Estimated >45 >40 mL/min   Anion gap 7 5 - 15  Wet prep, genital     Status: Abnormal   Collection Time: 06/19/21  4:24 AM   Specimen: PATH Cytology Cervicovaginal Ancillary Only  Result Value Ref Range   Yeast Wet Prep HPF POC PRESENT (A) NONE SEEN   Trich, Wet Prep NONE SEEN NONE SEEN   Clue Cells Wet Prep HPF POC PRESENT (A) NONE SEEN   WBC, Wet Prep HPF POC MANY (A) NONE SEEN   Sperm NONE SEEN    No results found.  MDM PE Labs: Wet prep, GC/CT EFM Assessment and Plan  40 year old [id number]  SIUP at 33.1 weeks Cat I FT PreTerm Contractions Covid Positive Headache Back Pain  -POC Reviewed -Briefly discussed effects of Covid on pregnancy. -Cautioned that preterm labor and back pain has been noted in pregnant patients. -Patient states that if labor is active, she declines interventions to stop it.  -Patient reports that she feels baby would do better outside of the baby. -Educated that baby is safer inside right now as she has no risk of covid exposure in utero. -Patient verbalizes understanding. -Exam performed and findings discussed. -Cultures collected and pending.  -Informed that findings are suspicious for  vaginal yeast infection.  -Reassured that cervical exam is as expected for grand multiparity. -Discussed additional IV fluids and pain medication to aide in improvement of pain/discomfort. -Patient agreeable.  -Will give flexeril and tylenol for back pain and HA.   Cherre Robins MSN, CNM 06/19/2021, 3:31 AM   Reassessment (4:58 AM)  -Patient reports improvement in contractions with medication and IV fluids. -Also reports back pain has improved. -Discussed wet prep results. -Will treat for BV as well as yeast. -Patient without questions or concerns. -Encouraged to call or return to MAU if symptoms worsen or with the onset of new symptoms. -Discharged to home in stable condition.  Cherre Robins MSN, CNM Advanced Practice Provider, Center for [employer]

## 2021-06-19 NOTE — ED Notes (Signed)
Spoke with Brooke from [facility]. Updated about pt. Rn stated she would call back for more information shortly.

## 2021-06-19 NOTE — MAU Note (Signed)
Pt discharged per order - waiting on husband

## 2021-06-19 NOTE — Progress Notes (Signed)
Ariel RN called [facility] RN back, pt now stating contractions are getting worse and she would like be to transferred to MAU for evaluation.   Dr.Arnold called at 62 with the update, Dr.Arnold accepts the patient to be transferred to MAU.   [facility] RN called MAU charge nurse at 131 and gave report.   FHR still category 1, contractions every 2-3 minutes.

## 2021-06-19 NOTE — Progress Notes (Signed)
Received call at [phone] from [facility] ED about pt who reports with contractions.  Pt is a G9P7 at [redacted]w[redacted]d. Pt reports no vaginal bleeding or leaking of fluid and reports positive fetal movement.  Pt states she was exposed to Covid and is seeking a Covid 19 test. Pt states she thinks the contractions are just [facility] but rates her pain 10/10. Pt is talking normally through contractions.  Pt is contracting regularly every 2 minutes. FHR category 1.  Dr.Arnold called at 0033, recommends 500cc fluid bolus for patient.

## 2021-06-19 NOTE — MAU Note (Signed)
Pt drowsy from pain medication that was given prior to transfer-

## 2021-06-19 NOTE — MAU Note (Signed)
Pt arrived via [facility] transfer -Covid + and reports preterm contractions. Pt denies vaginal bleeding or bloody show. Reports constant lower back pain, frontal headache. Went to [facility] ER after her son tested positive for covid. Pt denies fever, SOB, difficulty breathing, chest pain or pressure. 20g NS loc noted in LFA. External fetal and labor monitors applied. Pt reports + fetal movement.

## 2021-06-21 LAB — GC/CHLAMYDIA PROBE AMP (~~LOC~~) NOT AT ARMC
Chlamydia: NEGATIVE
Comment: NEGATIVE
Comment: NORMAL
Neisseria Gonorrhea: NEGATIVE

## 2021-06-24 ENCOUNTER — Ambulatory Visit: Payer: 59

## 2021-06-24 ENCOUNTER — Ambulatory Visit: Payer: 59 | Attending: Maternal & Fetal Medicine

## 2021-06-24 ENCOUNTER — Telehealth: Payer: Self-pay

## 2021-06-24 NOTE — Telephone Encounter (Signed)
mar/left msg for patient to advise  per dr Judeth Cornfield pls cancel for today (pt tested positive and was symptomatic)-pt okay to come to next appointment 07/01/2021.

## 2021-06-30 ENCOUNTER — Other Ambulatory Visit: Payer: Self-pay | Admitting: Maternal & Fetal Medicine

## 2021-06-30 DIAGNOSIS — O09299 Supervision of pregnancy with other poor reproductive or obstetric history, unspecified trimester: Secondary | ICD-10-CM

## 2021-06-30 DIAGNOSIS — O09523 Supervision of elderly multigravida, third trimester: Secondary | ICD-10-CM

## 2021-06-30 DIAGNOSIS — O10913 Unspecified pre-existing hypertension complicating pregnancy, third trimester: Secondary | ICD-10-CM

## 2021-06-30 DIAGNOSIS — O99213 Obesity complicating pregnancy, third trimester: Secondary | ICD-10-CM

## 2021-06-30 DIAGNOSIS — Z3A34 34 weeks gestation of pregnancy: Secondary | ICD-10-CM

## 2021-07-01 ENCOUNTER — Encounter: Payer: Self-pay | Admitting: *Deleted

## 2021-07-01 ENCOUNTER — Other Ambulatory Visit: Payer: Self-pay

## 2021-07-01 ENCOUNTER — Ambulatory Visit: Payer: 59 | Admitting: *Deleted

## 2021-07-01 ENCOUNTER — Ambulatory Visit: Payer: 59 | Attending: Maternal & Fetal Medicine

## 2021-07-01 VITALS — BP 133/90 | HR 96

## 2021-07-01 DIAGNOSIS — E669 Obesity, unspecified: Secondary | ICD-10-CM | POA: Diagnosis not present

## 2021-07-01 DIAGNOSIS — O09523 Supervision of elderly multigravida, third trimester: Secondary | ICD-10-CM

## 2021-07-01 DIAGNOSIS — O0990 Supervision of high risk pregnancy, unspecified, unspecified trimester: Secondary | ICD-10-CM

## 2021-07-01 DIAGNOSIS — O09299 Supervision of pregnancy with other poor reproductive or obstetric history, unspecified trimester: Secondary | ICD-10-CM

## 2021-07-01 DIAGNOSIS — O10013 Pre-existing essential hypertension complicating pregnancy, third trimester: Secondary | ICD-10-CM

## 2021-07-01 DIAGNOSIS — Z3A34 34 weeks gestation of pregnancy: Secondary | ICD-10-CM | POA: Insufficient documentation

## 2021-07-01 DIAGNOSIS — O10913 Unspecified pre-existing hypertension complicating pregnancy, third trimester: Secondary | ICD-10-CM | POA: Insufficient documentation

## 2021-07-01 DIAGNOSIS — O99213 Obesity complicating pregnancy, third trimester: Secondary | ICD-10-CM | POA: Diagnosis not present

## 2021-07-01 DIAGNOSIS — O09293 Supervision of pregnancy with other poor reproductive or obstetric history, third trimester: Secondary | ICD-10-CM

## 2021-07-07 ENCOUNTER — Other Ambulatory Visit: Payer: Self-pay

## 2021-07-07 ENCOUNTER — Encounter (HOSPITAL_COMMUNITY): Payer: Self-pay | Admitting: Obstetrics & Gynecology

## 2021-07-07 ENCOUNTER — Ambulatory Visit (INDEPENDENT_AMBULATORY_CARE_PROVIDER_SITE_OTHER): Payer: 59 | Admitting: Family Medicine

## 2021-07-07 ENCOUNTER — Encounter: Payer: Self-pay | Admitting: Family Medicine

## 2021-07-07 ENCOUNTER — Inpatient Hospital Stay (HOSPITAL_COMMUNITY)
Admission: AD | Admit: 2021-07-07 | Discharge: 2021-07-11 | DRG: 807 | Disposition: A | Discharge status: Home / Self Care | Payer: 59 | Attending: Obstetrics and Gynecology | Admitting: Obstetrics and Gynecology

## 2021-07-07 VITALS — BP 167/104 | HR 108 | Wt 213.0 lb

## 2021-07-07 DIAGNOSIS — Z641 Problems related to multiparity: Secondary | ICD-10-CM

## 2021-07-07 DIAGNOSIS — O1002 Pre-existing essential hypertension complicating childbirth: Secondary | ICD-10-CM | POA: Diagnosis present

## 2021-07-07 DIAGNOSIS — F4001 Agoraphobia with panic disorder: Secondary | ICD-10-CM | POA: Diagnosis present

## 2021-07-07 DIAGNOSIS — Z87891 Personal history of nicotine dependence: Secondary | ICD-10-CM | POA: Diagnosis not present

## 2021-07-07 DIAGNOSIS — Z8759 Personal history of other complications of pregnancy, childbirth and the puerperium: Secondary | ICD-10-CM

## 2021-07-07 DIAGNOSIS — E876 Hypokalemia: Secondary | ICD-10-CM | POA: Diagnosis present

## 2021-07-07 DIAGNOSIS — O09529 Supervision of elderly multigravida, unspecified trimester: Secondary | ICD-10-CM

## 2021-07-07 DIAGNOSIS — Z3A35 35 weeks gestation of pregnancy: Secondary | ICD-10-CM

## 2021-07-07 DIAGNOSIS — O0990 Supervision of high risk pregnancy, unspecified, unspecified trimester: Secondary | ICD-10-CM

## 2021-07-07 DIAGNOSIS — O9902 Anemia complicating childbirth: Secondary | ICD-10-CM | POA: Diagnosis present

## 2021-07-07 DIAGNOSIS — Z7982 Long term (current) use of aspirin: Secondary | ICD-10-CM | POA: Diagnosis not present

## 2021-07-07 DIAGNOSIS — Z8659 Personal history of other mental and behavioral disorders: Secondary | ICD-10-CM

## 2021-07-07 DIAGNOSIS — F321 Major depressive disorder, single episode, moderate: Secondary | ICD-10-CM | POA: Diagnosis present

## 2021-07-07 DIAGNOSIS — O119 Pre-existing hypertension with pre-eclampsia, unspecified trimester: Secondary | ICD-10-CM | POA: Diagnosis present

## 2021-07-07 DIAGNOSIS — D509 Iron deficiency anemia, unspecified: Secondary | ICD-10-CM | POA: Diagnosis present

## 2021-07-07 DIAGNOSIS — O10913 Unspecified pre-existing hypertension complicating pregnancy, third trimester: Secondary | ICD-10-CM

## 2021-07-07 DIAGNOSIS — O114 Pre-existing hypertension with pre-eclampsia, complicating childbirth: Secondary | ICD-10-CM | POA: Diagnosis present

## 2021-07-07 DIAGNOSIS — R030 Elevated blood-pressure reading, without diagnosis of hypertension: Secondary | ICD-10-CM | POA: Diagnosis present

## 2021-07-07 DIAGNOSIS — O1414 Severe pre-eclampsia complicating childbirth: Secondary | ICD-10-CM | POA: Diagnosis not present

## 2021-07-07 DIAGNOSIS — O99284 Endocrine, nutritional and metabolic diseases complicating childbirth: Secondary | ICD-10-CM | POA: Diagnosis present

## 2021-07-07 DIAGNOSIS — O10919 Unspecified pre-existing hypertension complicating pregnancy, unspecified trimester: Secondary | ICD-10-CM | POA: Diagnosis present

## 2021-07-07 DIAGNOSIS — O99283 Endocrine, nutritional and metabolic diseases complicating pregnancy, third trimester: Secondary | ICD-10-CM | POA: Diagnosis not present

## 2021-07-07 LAB — URINALYSIS, ROUTINE W REFLEX MICROSCOPIC
Bilirubin Urine: NEGATIVE
Glucose, UA: NEGATIVE mg/dL
Hgb urine dipstick: NEGATIVE
Ketones, ur: NEGATIVE mg/dL
Nitrite: NEGATIVE
Protein, ur: NEGATIVE mg/dL
Specific Gravity, Urine: 1.013 (ref 1.005–1.030)
pH: 6 (ref 5.0–8.0)

## 2021-07-07 LAB — COMPREHENSIVE METABOLIC PANEL
ALT: 8 U/L (ref 0–44)
AST: 14 U/L — ABNORMAL LOW (ref 15–41)
Albumin: 2.6 g/dL — ABNORMAL LOW (ref 3.5–5.0)
Alkaline Phosphatase: 59 U/L (ref 38–126)
Anion gap: 9 (ref 5–15)
BUN: <5 mg/dL — ABNORMAL LOW (ref 6–20)
CO2: 23 mmol/L (ref 22–32)
Calcium: 8.3 mg/dL — ABNORMAL LOW (ref 8.9–10.3)
Chloride: 104 mmol/L (ref 98–111)
Creatinine, Ser: 0.49 mg/dL (ref 0.44–1.00)
GFR, Estimated: >60 mL/min (ref >60)
Glucose, Bld: 77 mg/dL (ref 70–99)
Potassium: 2.9 mmol/L — ABNORMAL LOW (ref 3.5–5.1)
Sodium: 136 mmol/L (ref 135–145)
Total Bilirubin: 0.5 mg/dL (ref 0.3–1.2)
Total Protein: 6 g/dL — ABNORMAL LOW (ref 6.5–8.1)

## 2021-07-07 LAB — TYPE AND SCREEN
ABO/RH(D): O POS
Antibody Screen: NEGATIVE

## 2021-07-07 LAB — CBC
HCT: 26.7 % — ABNORMAL LOW (ref 36.0–46.0)
Hemoglobin: 9.2 g/dL — ABNORMAL LOW (ref 12.0–15.0)
MCH: 31.6 pg (ref 26.0–34.0)
MCHC: 34.5 g/dL (ref 30.0–36.0)
MCV: 91.8 fL (ref 80.0–100.0)
Platelets: 245 10*3/uL (ref 150–400)
RBC: 2.91 MIL/uL — ABNORMAL LOW (ref 3.87–5.11)
RDW: 12.6 % (ref 11.5–15.5)
WBC: 9.6 10*3/uL (ref 4.0–10.5)
nRBC: 0 % (ref 0.0–0.2)

## 2021-07-07 LAB — PROTEIN / CREATININE RATIO, URINE
Creatinine, Urine: 144.39 mg/dL
Protein Creatinine Ratio: 0.11 mg/mg{Cre} (ref 0.00–0.15)
Total Protein, Urine: 16 mg/dL

## 2021-07-07 MED ORDER — LACTATED RINGERS IV SOLN: 500.0000 mL | INTRAVENOUS | Status: DC | PRN

## 2021-07-07 MED ORDER — LIDOCAINE HCL (PF) 1 % IJ SOLN: 30.0000 mL | INTRAMUSCULAR | Status: DC | PRN

## 2021-07-07 MED ORDER — MAGNESIUM SULFATE 40 GM/1000ML IV SOLN
2.0000 g/h | INTRAVENOUS | Status: DC
  Filled 2021-07-07: qty 1000

## 2021-07-07 MED ORDER — DOCUSATE SODIUM 100 MG PO CAPS: 100.0000 mg | ORAL_CAPSULE | Freq: Every day | ORAL | Status: DC

## 2021-07-07 MED ORDER — LABETALOL HCL 5 MG/ML IV SOLN: 80.0000 mg | INTRAVENOUS | Status: DC | PRN

## 2021-07-07 MED ORDER — ONDANSETRON HCL 4 MG/2ML IJ SOLN
4.0000 mg | Freq: Four times a day (QID) | INTRAMUSCULAR | Status: DC | PRN
  Administered 2021-07-07: 4 mg via INTRAVENOUS
  Filled 2021-07-07: qty 2

## 2021-07-07 MED ORDER — POTASSIUM CHLORIDE CRYS ER 20 MEQ PO TBCR
20.0000 meq | EXTENDED_RELEASE_TABLET | Freq: Two times a day (BID) | ORAL | Status: AC
  Administered 2021-07-07 – 2021-07-09 (×4): 20 meq via ORAL
  Filled 2021-07-07 (×5): qty 1

## 2021-07-07 MED ORDER — TERBUTALINE SULFATE 1 MG/ML IJ SOLN: 0.2500 mg | Freq: Once | INTRAMUSCULAR | Status: DC | PRN

## 2021-07-07 MED ORDER — OXYTOCIN BOLUS FROM INFUSION
333.0000 mL | Freq: Once | INTRAVENOUS | Status: AC
  Administered 2021-07-08: 333 mL via INTRAVENOUS

## 2021-07-07 MED ORDER — FENTANYL CITRATE (PF) 100 MCG/2ML IJ SOLN
100.0000 ug | INTRAMUSCULAR | Status: DC | PRN
  Administered 2021-07-07 – 2021-07-08 (×2): 100 ug via INTRAVENOUS
  Filled 2021-07-07 (×2): qty 2

## 2021-07-07 MED ORDER — ACETAMINOPHEN 325 MG PO TABS
650.0000 mg | ORAL_TABLET | ORAL | Status: DC | PRN
  Administered 2021-07-08: 650 mg via ORAL
  Filled 2021-07-07: qty 2

## 2021-07-07 MED ORDER — SOD CITRATE-CITRIC ACID 500-334 MG/5ML PO SOLN: 30.0000 mL | ORAL | Status: DC | PRN

## 2021-07-07 MED ORDER — LACTATED RINGERS IV SOLN: INTRAVENOUS | Status: DC

## 2021-07-07 MED ORDER — PENICILLIN G POT IN DEXTROSE 60000 UNIT/ML IV SOLN
3.0000 10*6.[IU] | INTRAVENOUS | Status: DC
  Administered 2021-07-08: 3 10*6.[IU] via INTRAVENOUS
  Filled 2021-07-07: qty 50

## 2021-07-07 MED ORDER — PANTOPRAZOLE SODIUM 40 MG PO TBEC
40.0000 mg | DELAYED_RELEASE_TABLET | Freq: Every day | ORAL | Status: DC
  Administered 2021-07-08: 40 mg via ORAL
  Filled 2021-07-07: qty 1

## 2021-07-07 MED ORDER — ASPIRIN 81 MG PO CHEW: 81.0000 mg | CHEWABLE_TABLET | Freq: Every day | ORAL | Status: DC

## 2021-07-07 MED ORDER — SODIUM CHLORIDE 0.9 % IV SOLN
5.0000 10*6.[IU] | Freq: Once | INTRAVENOUS | Status: AC
  Administered 2021-07-07: 5 10*6.[IU] via INTRAVENOUS
  Filled 2021-07-07: qty 5

## 2021-07-07 MED ORDER — LABETALOL HCL 5 MG/ML IV SOLN: 40.0000 mg | INTRAVENOUS | Status: DC | PRN

## 2021-07-07 MED ORDER — OXYCODONE-ACETAMINOPHEN 5-325 MG PO TABS: 1.0000 | ORAL_TABLET | ORAL | Status: DC | PRN

## 2021-07-07 MED ORDER — ACETAMINOPHEN 325 MG PO TABS: 650.0000 mg | ORAL_TABLET | ORAL | Status: DC | PRN

## 2021-07-07 MED ORDER — OXYCODONE-ACETAMINOPHEN 5-325 MG PO TABS: 2.0000 | ORAL_TABLET | ORAL | Status: DC | PRN

## 2021-07-07 MED ORDER — HYDRALAZINE HCL 20 MG/ML IJ SOLN: 10.0000 mg | INTRAMUSCULAR | Status: DC | PRN

## 2021-07-07 MED ORDER — CALCIUM CARBONATE ANTACID 500 MG PO CHEW: 2.0000 | CHEWABLE_TABLET | ORAL | Status: DC | PRN

## 2021-07-07 MED ORDER — ZOLPIDEM TARTRATE 5 MG PO TABS: 5.0000 mg | ORAL_TABLET | Freq: Every evening | ORAL | Status: DC | PRN

## 2021-07-07 MED ORDER — LABETALOL HCL 5 MG/ML IV SOLN
40.0000 mg | INTRAVENOUS | Status: DC | PRN
  Administered 2021-07-07: 40 mg via INTRAVENOUS
  Filled 2021-07-07: qty 8

## 2021-07-07 MED ORDER — PRENATAL MULTIVITAMIN CH: 1.0000 | ORAL_TABLET | Freq: Every day | ORAL | Status: DC

## 2021-07-07 MED ORDER — LABETALOL HCL 5 MG/ML IV SOLN: 20.0000 mg | INTRAVENOUS | Status: DC | PRN

## 2021-07-07 MED ORDER — FENTANYL CITRATE (PF) 100 MCG/2ML IJ SOLN
50.0000 ug | INTRAMUSCULAR | Status: DC | PRN
Start: 2021-07-07 — End: 2021-07-07

## 2021-07-07 MED ORDER — NIFEDIPINE ER OSMOTIC RELEASE 60 MG PO TB24
60.0000 mg | ORAL_TABLET | Freq: Two times a day (BID) | ORAL | Status: DC
  Administered 2021-07-07 – 2021-07-11 (×8): 60 mg via ORAL
  Filled 2021-07-07 (×9): qty 1

## 2021-07-07 MED ORDER — PANTOPRAZOLE SODIUM 40 MG PO TBEC
40.0000 mg | DELAYED_RELEASE_TABLET | Freq: Every day | ORAL | Status: DC
  Filled 2021-07-07: qty 1

## 2021-07-07 MED ORDER — OXYTOCIN-SODIUM CHLORIDE 30-0.9 UT/500ML-% IV SOLN
2.5000 [IU]/h | INTRAVENOUS | Status: DC
  Filled 2021-07-07: qty 500

## 2021-07-07 MED ORDER — MISOPROSTOL 25 MCG QUARTER TABLET: 25.0000 ug | ORAL_TABLET | Freq: Once | ORAL | Status: AC

## 2021-07-07 MED ORDER — LABETALOL HCL 5 MG/ML IV SOLN
20.0000 mg | INTRAVENOUS | Status: DC | PRN
  Administered 2021-07-07: 20 mg via INTRAVENOUS
  Filled 2021-07-07: qty 4

## 2021-07-07 MED ORDER — MISOPROSTOL 25 MCG QUARTER TABLET
ORAL_TABLET | ORAL | Status: AC
  Administered 2021-07-07: 25 ug via VAGINAL
  Filled 2021-07-07: qty 1

## 2021-07-07 MED ORDER — LABETALOL HCL 200 MG PO TABS
600.0000 mg | ORAL_TABLET | Freq: Two times a day (BID) | ORAL | Status: DC
  Administered 2021-07-07: 600 mg via ORAL
  Filled 2021-07-07: qty 3

## 2021-07-07 MED ORDER — MAGNESIUM SULFATE BOLUS VIA INFUSION
4.0000 g | Freq: Once | INTRAVENOUS | Status: AC
  Administered 2021-07-07: 4 g via INTRAVENOUS
  Filled 2021-07-07: qty 1000

## 2021-07-07 MED ORDER — OXYTOCIN-SODIUM CHLORIDE 30-0.9 UT/500ML-% IV SOLN
1.0000 m[IU]/min | INTRAVENOUS | Status: DC
  Administered 2021-07-08: 2 m[IU]/min via INTRAVENOUS

## 2021-07-07 NOTE — Progress Notes (Signed)
Due to severe range pressures, will plan to proceed with delivery.  IV Labetalol given now, plan to start IV Magnesium for seizure prophyalxis.Pt agreeable to plan.  Myna Hidalgo, DO Attending Obstetrician & Gynecologist, [facility] [employer], [facility]

## 2021-07-07 NOTE — MAU Provider Note (Signed)
See H&P note.

## 2021-07-07 NOTE — Progress Notes (Signed)
   Subjective:  Paula Dominguez is a 40 y.o. [id number] at [redacted]w[redacted]d being seen today for ongoing prenatal care.  She is currently monitored for the following issues for this high-risk pregnancy and has History of pre-eclampsia; Panic disorder with agoraphobia and moderate panic attacks; Chronic hypertension during pregnancy, antepartum; Grand multiparity; Genital warts; Iron deficiency anemia; Migraine headache; Mixed hyperlipidemia; Moderate major depression (HCC); Supervision of high risk pregnancy, antepartum; and Trichomoniasis on their problem list.  Patient reports  frequent contractions .  Contractions: Irregular. Vag. Bleeding: Scant.  Movement: Present. Denies leaking of fluid.   The following portions of the patient's history were reviewed and updated as appropriate: allergies, current medications, past family history, past medical history, past social history, past surgical history and problem list. Problem list updated.  Objective:   Vitals:   07/07/21 1106 07/07/21 1107  BP: (!) 177/106 (!) 167/104  Pulse: (!) 108   Weight: 213 lb (96.6 kg)     Fetal Status: Fetal Heart Rate (bpm): 131   Movement: Present     General:  Alert, oriented and cooperative. Patient is in no acute distress.  Skin: Skin is warm and dry. No rash noted.   Cardiovascular: Normal heart rate noted  Respiratory: Normal respiratory effort, no problems with respiration noted  Abdomen: Soft, gravid, appropriate for gestational age. Pain/Pressure: Present     Pelvic: Vag. Bleeding: Scant Vag D/C Character: Mucous   Cervical exam deferred        Extremities: Normal range of motion.  Edema: Trace  Mental Status: Normal mood and affect. Normal behavior. Normal judgment and thought content.   Urinalysis:      Assessment and Plan:  Pregnancy: [id number] at [redacted]w[redacted]d  1. Supervision of high risk pregnancy, antepartum BP severe range FHR normal  2. Chronic hypertension during pregnancy, antepartum Patient reports she  is compliant with her BP medications and took them this morning Endorsed headache to MA, denied to me, mild edema on exam in LE and endorses shortness of breath though appears to be related more to advanced gestational age Given hx of pre-e and severe range BP's will send to MAU for an evaluation Discussed with her that there is a high chance she will be admitted for delivery, unless her BP normalizes. Even if that is the case would recommend IOL at around 37 wks Signout called to MAU  3. History of pre-eclampsia See above  4. Grand multiparity P7  Preterm labor symptoms and general obstetric precautions including but not limited to vaginal bleeding, contractions, leaking of fluid and fetal movement were reviewed in detail with the patient. Please refer to After Visit Summary for other counseling recommendations.  Return in 6 weeks (on 08/18/2021) for PP check.   Venora Maples, MD

## 2021-07-07 NOTE — H&P (Signed)
History     CSN: [id number]  Arrival date and time: 07/07/21 1147   Event Date/Time   First Provider Initiated Contact with Patient 07/07/21 1226      Chief Complaint  Patient presents with   BP Evaluation   HPI Paula Dominguez 40 y.o. [redacted]w[redacted]d Was seen in the office today at [facility]  BP was elevated so sent to MAU for evaluation.  Has chronic hypertension and a history of preeclampsia.  She had severe range blood pressure with a headache at the office today.  Blood pressures in MAU have been elevated but not severe range pressures. She began having some contractions while she was at the office for her visit there. Reports she takes her BP meds daily and did take them today:  Labatelol 600 mg BID and Procardia 60 mg Daily.   OB History     Gravida  9   Para  7   Term  7   Preterm  0   AB  1   Living  7      SAB  1   IAB      Ectopic      Multiple  0   Live Births  7           Past Medical History:  Diagnosis Date   Anemia    Anxiety and depression    Blood transfusion without reported diagnosis    2014, 2018   GERD (gastroesophageal reflux disease)    Hx of preeclampsia, prior pregnancy, currently pregnant    prior pregnancy   Hypertension     Past Surgical History:  Procedure Laterality Date   HEMORRHOID SURGERY      Family History  Problem Relation Age of Onset   Healthy Mother    Healthy Father    Hypertension Other    Cancer Other     Social History   Tobacco Use   Smoking status: Former    Packs/day: 1.00    Years: 17.00    Pack years: 17.00    Types: Cigarettes    Quit date: 07/27/2013    Years since quitting: 7.9   Smokeless tobacco: Never  Vaping Use   Vaping Use: Former   Substances: Nicotine, Flavoring  Substance Use Topics   Alcohol use: No   Drug use: No    Allergies: No Known Allergies  Medications Prior to Admission  Medication Sig Dispense Refill Last Dose   aspirin 81 MG chewable tablet Chew 1 tablet (81  mg total) by mouth daily. 30 tablet 5    cyclobenzaprine (FLEXERIL) 10 MG tablet Take 1 tablet (10 mg total) by mouth every 8 (eight) hours as needed for muscle spasms. 30 tablet 1    labetalol (NORMODYNE) 200 MG tablet Take 1 tablet (200 mg total) by mouth 2 (two) times daily. 60 tablet 3    NIFEdipine (PROCARDIA XL/NIFEDICAL XL) 60 MG 24 hr tablet Take 1 tablet (60 mg total) by mouth daily. 30 tablet 0    omeprazole (PRILOSEC) 40 MG capsule Take 1 capsule (40 mg total) by mouth daily. 60 capsule 4    ondansetron (ZOFRAN ODT) 4 MG disintegrating tablet Take 1 tablet (4 mg total) by mouth every 6 (six) hours as needed for nausea. If Phenergan doesn't work 20 tablet 2    Prenatal Vit-Fe Fumarate-FA (PNV PRENATAL PLUS MULTIVITAMIN) 27-1 MG TABS Take 1 capsule by mouth daily. 30 tablet 11     Review of Systems  Constitutional:  Negative for  fever.  Respiratory:  Negative for cough, shortness of breath and wheezing.   Gastrointestinal:  Positive for abdominal pain. Negative for nausea and vomiting.  Genitourinary:  Negative for vaginal bleeding and vaginal discharge.  Neurological:  Positive for headaches.  Physical Exam   Blood pressure (!) 157/95, pulse 93, temperature 98 F (36.7 C), temperature source Oral, resp. rate 20, height 5\' 6"  (1.676 m), weight 96.1 kg, last menstrual period 10/23/2020, SpO2 99 %.  Physical Exam Vitals and nursing note reviewed.  Constitutional:      Appearance: She is well-developed.  HENT:     Head: Normocephalic.  Cardiovascular:     Rate and Rhythm: Regular rhythm.  Pulmonary:     Effort: Pulmonary effort is normal.  Abdominal:     Palpations: Abdomen is soft.     Tenderness: There is no abdominal tenderness. There is no guarding or rebound.     Comments: RUQ pain with palpation  Musculoskeletal:        General: Normal range of motion.     Cervical back: Neck supple.  Skin:    General: Skin is warm and dry.  Neurological:     Mental Status: She is  alert and oriented to person, place, and time.     Comments: Reports headache and blurred vision  Psychiatric:        Mood and Affect: Mood normal.        Behavior: Behavior normal.        Thought Content: Thought content normal.   NST  FHT baseline 130 with decreased variability of 5-10 Beats/min Accels of 15x15 noted Contractions irregular at 3-10 mintues No decelerations   MAU Course  Procedures LABS Results for orders placed or performed during the hospital encounter of 07/07/21 (from the past 24 hour(s))  Urinalysis, Routine w reflex microscopic Urine, Clean Catch     Status: Abnormal   Collection Time: 07/07/21 12:15 PM  Result Value Ref Range   Color, Urine YELLOW YELLOW   APPearance HAZY (A) CLEAR   Specific Gravity, Urine 1.013 1.005 - 1.030   pH 6.0 5.0 - 8.0   Glucose, UA NEGATIVE NEGATIVE mg/dL   Hgb urine dipstick NEGATIVE NEGATIVE   Bilirubin Urine NEGATIVE NEGATIVE   Ketones, ur NEGATIVE NEGATIVE mg/dL   Protein, ur NEGATIVE NEGATIVE mg/dL   Nitrite NEGATIVE NEGATIVE   Leukocytes,Ua MODERATE (A) NEGATIVE   RBC / HPF 0-5 0 - 5 RBC/hpf   WBC, UA 0-5 0 - 5 WBC/hpf   Bacteria, UA RARE (A) NONE SEEN   Squamous Epithelial / LPF 6-10 0 - 5   Mucus PRESENT   Protein / creatinine ratio, urine     Status: None   Collection Time: 07/07/21 12:25 PM  Result Value Ref Range   Creatinine, Urine 144.39 mg/dL   Total Protein, Urine 16 mg/dL   Protein Creatinine Ratio 0.11 0.00 - 0.15 mg/mg[Cre]  CBC     Status: Abnormal   Collection Time: 07/07/21 12:33 PM  Result Value Ref Range   WBC 9.6 4.0 - 10.5 K/uL   RBC 2.91 (L) 3.87 - 5.11 MIL/uL   Hemoglobin 9.2 (L) 12.0 - 15.0 g/dL   HCT 07/09/21 (L) 43.1 - 54.0 %   MCV 91.8 80.0 - 100.0 fL   MCH 31.6 26.0 - 34.0 pg   MCHC 34.5 30.0 - 36.0 g/dL   RDW 08.6 76.1 - 95.0 %   Platelets 245 150 - 400 K/uL   nRBC 0.0 0.0 - 0.2 %  Comprehensive metabolic panel     Status: Abnormal   Collection Time: 07/07/21 12:33 PM  Result  Value Ref Range   Sodium 136 135 - 145 mmol/L   Potassium 2.9 (L) 3.5 - 5.1 mmol/L   Chloride 104 98 - 111 mmol/L   CO2 23 22 - 32 mmol/L   Glucose, Bld 77 70 - 99 mg/dL   BUN <5 (L) 6 - 20 mg/dL   Creatinine, Ser 5.72 0.44 - 1.00 mg/dL   Calcium 8.3 (L) 8.9 - 10.3 mg/dL   Total Protein 6.0 (L) 6.5 - 8.1 g/dL   Albumin 2.6 (L) 3.5 - 5.0 g/dL   AST 14 (L) 15 - 41 U/L   ALT 8 0 - 44 U/L   Alkaline Phosphatase 59 38 - 126 U/L   Total Bilirubin 0.5 0.3 - 1.2 mg/dL   GFR, Estimated >62 >03 mL/min   Anion gap 9 5 - 15     MDM 1400 has now developed headache, RUQ pain and blurred vision when she was looking at the TV remote (lighting in the room is dim).  BPs are elevated and only one in severe range.  Awaiting lab results. 1445 Labs reviewed and discussed with Dr. Debroah Loop who came to see patient. 1505  Will admit for observation since pressures have been borderline severe today Dr. Debroah Loop will do orders.   Assessment and Plan   Chronic hypertension with 2 severe range pressures (one BP in severe range here in MAU and one in the office today) - BP hard to control Irregular contractions Hypokalemia  Plan Admit for observation and monitor BP Will have MFM consult with patient's plan tomorrow  Currie Paris 07/07/2021, 12:40 PM

## 2021-07-07 NOTE — Progress Notes (Signed)
Patient ID: Valita Righter, female   DOB: 1981/04/15, 40 y.o.   MRN: [id number]  Now on L&D with mag sulfate started; required IV Lab x 2 doses earlier this evening with mild range BP elevations since; feels okay; no H/A, visual disturbances, or RUQ pain; irreg ctx, no leaking  BPs 143/85, 149/86, 143/76 FHR 130s, +accels, no decels, Cat 1 Irreg ctx Cx soft/post/1+/50/vtx -2; sm bloody show  Hgb 9.2 K+ 2.9 Urine P/C: 0.11  IUP@35 .5wks cHTN with SIPE (BPs; neg labs) Hypokalemia Cx unfavorable  -Discussed IOL process and methods -Cervical foley inserted without difficulty and inflated w 60cc fluid; vag cytotec placed -Plan for Pitocin when foley dislodges -PCN ordered for pending GBS -Kdur bid x 2d ordered for K+ replacement -Anticipate vag del  Arabella Merles [facility] 07/07/2021

## 2021-07-07 NOTE — Patient Instructions (Signed)

## 2021-07-07 NOTE — MAU Note (Signed)
Sent from MD office for BP evaluation.  Denies H/A or visual disturbances.  Endorses  sharp intermittent epigastric pain.  Denies VB or LOF, reports spotting & losing mucous plug.  Endorses +FM.

## 2021-07-08 ENCOUNTER — Inpatient Hospital Stay (HOSPITAL_COMMUNITY): Payer: 59 | Admitting: Anesthesiology

## 2021-07-08 ENCOUNTER — Ambulatory Visit: Payer: 59

## 2021-07-08 ENCOUNTER — Encounter (HOSPITAL_COMMUNITY): Payer: Self-pay | Admitting: Obstetrics & Gynecology

## 2021-07-08 DIAGNOSIS — O1414 Severe pre-eclampsia complicating childbirth: Secondary | ICD-10-CM

## 2021-07-08 DIAGNOSIS — O99283 Endocrine, nutritional and metabolic diseases complicating pregnancy, third trimester: Secondary | ICD-10-CM

## 2021-07-08 DIAGNOSIS — O119 Pre-existing hypertension with pre-eclampsia, unspecified trimester: Secondary | ICD-10-CM

## 2021-07-08 DIAGNOSIS — Z3A35 35 weeks gestation of pregnancy: Secondary | ICD-10-CM

## 2021-07-08 LAB — CBC
HCT: 27.3 % — ABNORMAL LOW (ref 36.0–46.0)
Hemoglobin: 9.2 g/dL — ABNORMAL LOW (ref 12.0–15.0)
MCH: 31.7 pg (ref 26.0–34.0)
MCHC: 33.7 g/dL (ref 30.0–36.0)
MCV: 94.1 fL (ref 80.0–100.0)
Platelets: 232 10*3/uL (ref 150–400)
RBC: 2.9 MIL/uL — ABNORMAL LOW (ref 3.87–5.11)
RDW: 12.7 % (ref 11.5–15.5)
WBC: 10.3 10*3/uL (ref 4.0–10.5)
nRBC: 0 % (ref 0.0–0.2)

## 2021-07-08 LAB — MAGNESIUM: Magnesium: 4.4 mg/dL — ABNORMAL HIGH (ref 1.7–2.4)

## 2021-07-08 LAB — RPR: RPR Ser Ql: NONREACTIVE

## 2021-07-08 MED ORDER — DIPHENHYDRAMINE HCL 25 MG PO CAPS: 25.0000 mg | ORAL_CAPSULE | Freq: Four times a day (QID) | ORAL | Status: DC | PRN

## 2021-07-08 MED ORDER — ZOLPIDEM TARTRATE 5 MG PO TABS: 5.0000 mg | ORAL_TABLET | Freq: Every evening | ORAL | Status: DC | PRN

## 2021-07-08 MED ORDER — SENNOSIDES-DOCUSATE SODIUM 8.6-50 MG PO TABS
2.0000 | ORAL_TABLET | ORAL | Status: DC
  Administered 2021-07-08 – 2021-07-11 (×4): 2 via ORAL
  Filled 2021-07-08 (×5): qty 2

## 2021-07-08 MED ORDER — IBUPROFEN 600 MG PO TABS
600.0000 mg | ORAL_TABLET | Freq: Four times a day (QID) | ORAL | Status: DC
  Administered 2021-07-08 – 2021-07-11 (×14): 600 mg via ORAL
  Filled 2021-07-08 (×15): qty 1

## 2021-07-08 MED ORDER — OXYCODONE HCL 5 MG PO TABS
5.0000 mg | ORAL_TABLET | ORAL | Status: DC | PRN
  Administered 2021-07-08 – 2021-07-10 (×10): 5 mg via ORAL
  Filled 2021-07-08 (×11): qty 1

## 2021-07-08 MED ORDER — TETANUS-DIPHTH-ACELL PERTUSSIS 5-2.5-18.5 LF-MCG/0.5 IM SUSY: 0.5000 mL | PREFILLED_SYRINGE | Freq: Once | INTRAMUSCULAR | Status: DC

## 2021-07-08 MED ORDER — FENTANYL-BUPIVACAINE-NACL 0.5-0.125-0.9 MG/250ML-% EP SOLN
12.0000 mL/h | EPIDURAL | Status: DC | PRN
  Administered 2021-07-08: 12 mL/h via EPIDURAL

## 2021-07-08 MED ORDER — TRANEXAMIC ACID-NACL 1000-0.7 MG/100ML-% IV SOLN
INTRAVENOUS | Status: AC
  Filled 2021-07-08: qty 100

## 2021-07-08 MED ORDER — EPHEDRINE 5 MG/ML INJ: 10.0000 mg | INTRAVENOUS | Status: DC | PRN

## 2021-07-08 MED ORDER — PANTOPRAZOLE SODIUM 40 MG PO TBEC
40.0000 mg | DELAYED_RELEASE_TABLET | Freq: Every day | ORAL | Status: DC
  Administered 2021-07-08 – 2021-07-11 (×4): 40 mg via ORAL
  Filled 2021-07-08 (×4): qty 1

## 2021-07-08 MED ORDER — WITCH HAZEL-GLYCERIN EX PADS: 1.0000 "application " | MEDICATED_PAD | CUTANEOUS | Status: DC | PRN

## 2021-07-08 MED ORDER — BENZOCAINE-MENTHOL 20-0.5 % EX AERO
1.0000 "application " | INHALATION_SPRAY | CUTANEOUS | Status: DC | PRN
  Administered 2021-07-08: 1 via TOPICAL
  Filled 2021-07-08: qty 56

## 2021-07-08 MED ORDER — SIMETHICONE 80 MG PO CHEW: 80.0000 mg | CHEWABLE_TABLET | ORAL | Status: DC | PRN

## 2021-07-08 MED ORDER — PRENATAL MULTIVITAMIN CH
1.0000 | ORAL_TABLET | Freq: Every day | ORAL | Status: DC
  Administered 2021-07-08 – 2021-07-11 (×4): 1 via ORAL
  Filled 2021-07-08 (×4): qty 1

## 2021-07-08 MED ORDER — DIBUCAINE (PERIANAL) 1 % EX OINT: 1.0000 "application " | TOPICAL_OINTMENT | CUTANEOUS | Status: DC | PRN

## 2021-07-08 MED ORDER — MEASLES, MUMPS & RUBELLA VAC IJ SOLR: 0.5000 mL | Freq: Once | INTRAMUSCULAR | Status: DC

## 2021-07-08 MED ORDER — PHENYLEPHRINE 40 MCG/ML (10ML) SYRINGE FOR IV PUSH (FOR BLOOD PRESSURE SUPPORT)
80.0000 ug | PREFILLED_SYRINGE | INTRAVENOUS | Status: DC | PRN
  Filled 2021-07-08: qty 10

## 2021-07-08 MED ORDER — FENTANYL-BUPIVACAINE-NACL 0.5-0.125-0.9 MG/250ML-% EP SOLN
EPIDURAL | Status: AC
  Filled 2021-07-08: qty 250

## 2021-07-08 MED ORDER — LACTATED RINGERS IV SOLN: INTRAVENOUS | Status: AC

## 2021-07-08 MED ORDER — LACTATED RINGERS IV SOLN: 500.0000 mL | Freq: Once | INTRAVENOUS | Status: DC

## 2021-07-08 MED ORDER — NICOTINE 21 MG/24HR TD PT24
21.0000 mg | MEDICATED_PATCH | Freq: Every day | TRANSDERMAL | Status: DC
  Administered 2021-07-08 – 2021-07-11 (×4): 21 mg via TRANSDERMAL
  Filled 2021-07-08 (×5): qty 1

## 2021-07-08 MED ORDER — TRANEXAMIC ACID-NACL 1000-0.7 MG/100ML-% IV SOLN
1000.0000 mg | INTRAVENOUS | Status: AC
  Administered 2021-07-08: 1000 mg via INTRAVENOUS

## 2021-07-08 MED ORDER — LIDOCAINE HCL (PF) 1 % IJ SOLN
INTRAMUSCULAR | Status: DC | PRN
  Administered 2021-07-08 (×2): 5 mL via EPIDURAL

## 2021-07-08 MED ORDER — PHENYLEPHRINE 40 MCG/ML (10ML) SYRINGE FOR IV PUSH (FOR BLOOD PRESSURE SUPPORT): 80.0000 ug | PREFILLED_SYRINGE | INTRAVENOUS | Status: DC | PRN

## 2021-07-08 MED ORDER — DIPHENHYDRAMINE HCL 50 MG/ML IJ SOLN: 12.5000 mg | INTRAMUSCULAR | Status: DC | PRN

## 2021-07-08 MED ORDER — ONDANSETRON HCL 4 MG PO TABS: 4.0000 mg | ORAL_TABLET | ORAL | Status: DC | PRN

## 2021-07-08 MED ORDER — ACETAMINOPHEN 325 MG PO TABS
650.0000 mg | ORAL_TABLET | ORAL | Status: DC | PRN
  Administered 2021-07-08 – 2021-07-09 (×7): 650 mg via ORAL
  Filled 2021-07-08 (×9): qty 2

## 2021-07-08 MED ORDER — MAGNESIUM SULFATE 40 GM/1000ML IV SOLN
2.0000 g/h | INTRAVENOUS | Status: AC
  Administered 2021-07-08: 2 g/h via INTRAVENOUS
  Filled 2021-07-08: qty 1000

## 2021-07-08 MED ORDER — COCONUT OIL OIL: 1.0000 "application " | TOPICAL_OIL | Status: DC | PRN

## 2021-07-08 MED ORDER — ONDANSETRON HCL 4 MG/2ML IJ SOLN: 4.0000 mg | INTRAMUSCULAR | Status: DC | PRN

## 2021-07-08 NOTE — Progress Notes (Signed)
Patient ID: Paula Dominguez, female   DOB: 11/05/1981, 40 y.o.   MRN: [id number]  Having fundal tightness but overall epidural is effective; PCN x 2 doses given for GBS unknown  BP 139/82 FHR 115-120, decreased variability, occ variables Ctx q 2 mins with Pit at 75mu/min Cx 8/C/vtx -1; ROM during exam for clear/portwine-tinged fluid  IUP@35 .6wks cHTN with severe SIPE Active labor  Anticipate vag del soon Will have NICU on standby  Paula Dominguez [facility] 07/08/2021

## 2021-07-08 NOTE — Discharge Summary (Addendum)
Postpartum Discharge Summary  Date of Service updated7/17     Patient Name: Paula Dominguez DOB: Jan 12, 1981 MRN: [id number]  Date of admission: 07/07/2021 Delivery date:07/08/2021  Delivering provider: Serita Grammes D  Date of discharge: 07/11/2021  Admitting diagnosis: Chronic hypertension during pregnancy, antepartum [O10.919] Chronic hypertension with superimposed preeclampsia [O11.9] Intrauterine pregnancy: [redacted]w[redacted]d    Secondary diagnosis:  Active Problems:   History of postpartum depression   Panic disorder with agoraphobia and moderate panic attacks   Grand multiparity   Iron deficiency anemia   Moderate major depression ([place]   Supervision of high risk pregnancy, antepartum   Chronic hypertension with superimposed pre-eclampsia   Hypokalemia   AMA (advanced maternal age) multigravida 35+  Additional problems: none    Discharge diagnosis: Preterm Pregnancy Delivered, CHTN with superimposed preeclampsia, and Anemia                                              Post partum procedures: none Augmentation: AROM, Pitocin, Cytotec, and IP Foley Complications: None  Hospital course: Induction of Labor With Vaginal Delivery   40y.o. yo [id number] admitted to the hospital 07/07/2021 for induction of labor.  Indication for induction:  cHTN with severe SIPE .  [facility] to [facility] range BP elevations and was sent to [facility] for further eval; [facility], however BPs became severe again and she was tx to L&D where she was induced and progressed to vag del approx 6hrs later. Of note, pre-e labs were neg however her K was 2.9 and was replaced. She received PCN x 2 doses for GBS unknown and preterm. Membrane Rupture Time/Date: 4:02 AM ,07/08/2021   Delivery Method:Vaginal, Spontaneous  Episiotomy: None  Lacerations:  None  Details of delivery can be found in separate delivery note.  Patient received IV  Magnesium x 24hr following delivery.  Due to continued elevated BP, anti-hypertensives were added/adjusted during her postpartum course.  She was discharged home on Procardia XL 6053mid, Maxzide daily and Lisiniopril 61m71mily. Patient is discharged home 07/11/21.  Newborn Data: Birth date:07/08/2021  Birth time:4:21 AM  Gender:Female  Living status:Living  Apgars:9 ,9  Weight:2405 g (5lb 4.8oz)  Magnesium Sulfate received: Yes: Seizure prophylaxis BMZ received: No Rhophylac:N/A MMR:N/A T-DaP:Given prenatally Flu: Yes Transfusion:No  Physical exam  Vitals:   07/10/21 2340 07/10/21 2345 07/11/21 0508 07/11/21 0819  BP:  (!) 133/96 129/81 130/86  Pulse:  84 87 76  Resp:  18 18 18   Temp:  98.6 F (37 C) 98.9 F (37.2 C) 98.7 F (37.1 C)  TempSrc:  Oral Oral Oral  SpO2: 97% 96% 99% 98%  Weight:      Height:       General: alert, cooperative, and no distress Lochia: appropriate Uterine Fundus: firm Incision: N/A DVT Evaluation: No evidence of DVT seen on physical exam. Labs: Lab Results  Component Value Date   WBC 11.6 (H) 07/09/2021   HGB 8.9 (L) 07/09/2021   HCT 26.7 (L) 07/09/2021   MCV 94.3 07/09/2021   PLT 251 07/09/2021   CMP Latest Ref Rng & Units 07/09/2021  Glucose 70 - 99 mg/dL 100(H)  BUN 6 - 20 mg/dL <5(L)  Creatinine 0.44 - 1.00 mg/dL 0.51  Sodium 135 -  145 mmol/L 134(L)  Potassium 3.5 - 5.1 mmol/L 3.3(L)  Chloride 98 - 111 mmol/L 104  CO2 22 - 32 mmol/L 24  Calcium 8.9 - 10.3 mg/dL 7.6(L)  Total Protein 6.5 - 8.1 g/dL 5.8(L)  Total Bilirubin 0.3 - 1.2 mg/dL 0.3  Alkaline Phos 38 - 126 U/L 57  AST 15 - 41 U/L 14(L)  ALT 0 - 44 U/L 8   Edinburgh Score: Edinburgh Postnatal Depression Scale Screening Tool 07/08/2021  I have been able to laugh and see the funny side of things. (No Data)  I have looked forward with enjoyment to things. -  I have blamed myself unnecessarily when things went wrong. -  I have been anxious or worried for no good  reason. -  I have felt scared or panicky for no good reason. -  Things have been getting on top of me. -  I have been so unhappy that I have had difficulty sleeping. -  I have felt sad or miserable. -  I have been so unhappy that I have been crying. -  The thought of harming myself has occurred to me. Flavia Shipper Postnatal Depression Scale Total -     After visit meds:  Allergies as of 07/11/2021   No Known Allergies      Medication List     STOP taking these medications    aspirin 81 MG chewable tablet   cyclobenzaprine 10 MG tablet Commonly known as: FLEXERIL   labetalol 200 MG tablet Commonly known as: NORMODYNE   ondansetron 4 MG disintegrating tablet Commonly known as: Zofran ODT       TAKE these medications    ascorbic acid 250 MG tablet Commonly known as: VITAMIN C Take 1 tablet (250 mg total) by mouth every other day. Start taking on: July 12, 2021   butalbital-acetaminophen-caffeine 50-325-40 MG tablet Commonly known as: FIORICET Take 1 tablet by mouth 2 (two) times daily as needed for headache.   ferrous sulfate 325 (65 FE) MG tablet Take 1 tablet (325 mg total) by mouth every other day. Start taking on: July 12, 2021   ibuprofen 600 MG tablet Commonly known as: ADVIL Take 1 tablet (600 mg total) by mouth every 6 (six) hours as needed for moderate pain or mild pain.   nicotine 21 mg/24hr patch Commonly known as: NICODERM CQ - dosed in mg/24 hours Place 1 patch (21 mg total) onto the skin daily.   NIFEdipine 60 MG 24 hr tablet Commonly known as: ADALAT CC Take 1 tablet (60 mg total) by mouth 2 (two) times daily. What changed: when to take this   omeprazole 40 MG capsule Commonly known as: PRILOSEC Take 1 capsule (40 mg total) by mouth daily.   oxyCODONE 5 MG immediate release tablet Commonly known as: Oxy IR/ROXICODONE Take 1-2 tablets (5-10 mg total) by mouth every 6 (six) hours as needed for up to 5 days for moderate pain (pain scale  4-7).   PNV Prenatal Plus Multivitamin 27-1 MG Tabs Take 1 capsule by mouth daily.   triamterene-hydrochlorothiazide 37.5-25 MG tablet Commonly known as: MAXZIDE-25 Take 1 tablet by mouth daily.         Discharge home in stable condition Infant Feeding: Bottle Infant Disposition:home with mother Discharge instruction: per After Visit Summary and Postpartum booklet. Activity: Advance as tolerated. Pelvic rest for 6 weeks.  Diet: routine diet Future Appointments: Future Appointments  Date Time Provider [place]  07/15/2021  1:40 PM [place] None  08/13/2021 10:15 AM Chancy Milroy, MD [place] None   Follow up Visit:  [place]. Schedule an appointment as soon as possible for a visit in 1 week(s).   Specialty: Obstetrics and Gynecology Contact information: [address]                Myrtis Ser, CNM  P [facility] Please schedule this patient for Postpartum visit in: 4 weeks with the following provider: Any provider  In-Person  For C/S patients schedule nurse incision check in weeks 2 weeks: no  High risk pregnancy complicated by: cHTN with superimposed pre-e  Delivery mode:  SVD  Anticipated Birth Control:  Depo (outpt; private ins)  PP Procedures needed: BP check in 1wk  Schedule Integrated [place] visit: offer (history PP dep  07/11/2021 Aletha Halim, MD

## 2021-07-08 NOTE — Plan of Care (Signed)
  Problem: Education: Goal: Knowledge of General Education information will improve Description: Including pain rating scale, medication(s)/side effects and non-pharmacologic comfort measures Outcome: Progressing   Problem: Health Behavior/Discharge Planning: Goal: Ability to manage health-related needs will improve Outcome: Progressing   Problem: Clinical Measurements: Goal: Ability to maintain clinical measurements within normal limits will improve Outcome: Progressing Goal: Will remain free from infection Outcome: Progressing Goal: Diagnostic test results will improve Outcome: Progressing Goal: Respiratory complications will improve Outcome: Progressing Goal: Cardiovascular complication will be avoided Outcome: Progressing   Problem: Activity: Goal: Risk for activity intolerance will decrease Outcome: Progressing   Problem: Nutrition: Goal: Adequate nutrition will be maintained Outcome: Progressing   Problem: Coping: Goal: Level of anxiety will decrease Outcome: Progressing   Problem: Elimination: Goal: Will not experience complications related to bowel motility Outcome: Progressing Goal: Will not experience complications related to urinary retention Outcome: Progressing   Problem: Pain Managment: Goal: General experience of comfort will improve Outcome: Progressing   Problem: Safety: Goal: Ability to remain free from injury will improve Outcome: Progressing   Problem: Skin Integrity: Goal: Risk for impaired skin integrity will decrease Outcome: Progressing   Problem: Education: Goal: Knowledge of disease or condition will improve Outcome: Progressing Goal: Knowledge of the prescribed therapeutic regimen will improve Outcome: Progressing Goal: Individualized Educational Video(s) Outcome: Progressing   Problem: Clinical Measurements: Goal: Complications related to the disease process, condition or treatment will be avoided or minimized Outcome:  Progressing   Problem: Education: Goal: Knowledge of disease or condition will improve Outcome: Progressing Goal: Knowledge of the prescribed therapeutic regimen will improve Outcome: Progressing   Problem: Fluid Volume: Goal: Peripheral tissue perfusion will improve Outcome: Progressing   Problem: Clinical Measurements: Goal: Complications related to disease process, condition or treatment will be avoided or minimized Outcome: Progressing   Problem: Education: Goal: Knowledge of Childbirth will improve Outcome: Completed/Met Goal: Ability to make informed decisions regarding treatment and plan of care will improve Outcome: Completed/Met Goal: Ability to state and carry out methods to decrease the pain will improve Outcome: Completed/Met Goal: Individualized Educational Video(s) Outcome: Completed/Met   Problem: Coping: Goal: Ability to verbalize concerns and feelings about labor and delivery will improve Outcome: Completed/Met   Problem: Life Cycle: Goal: Ability to make normal progression through stages of labor will improve Outcome: Completed/Met Goal: Ability to effectively push during vaginal delivery will improve Outcome: Completed/Met   Problem: Role Relationship: Goal: Will demonstrate positive interactions with the child Outcome: Completed/Met   Problem: Safety: Goal: Risk of complications during labor and delivery will decrease Outcome: Completed/Met   Problem: Pain Management: Goal: Relief or control of pain from uterine contractions will improve Outcome: Completed/Met

## 2021-07-08 NOTE — Anesthesia Postprocedure Evaluation (Signed)
Anesthesia Post Note  Patient: Paula Dominguez  Procedure(s) Performed: AN AD HOC LABOR EPIDURAL     Patient location during evaluation: Mother Baby Anesthesia Type: Epidural Level of consciousness: awake Pain management: satisfactory to patient Vital Signs Assessment: post-procedure vital signs reviewed and stable Respiratory status: spontaneous breathing Cardiovascular status: stable Anesthetic complications: no   No notable events documented.  Last Vitals:  Vitals:   07/08/21 1430 07/08/21 1553  BP:  120/66  Pulse:  80  Resp: 17 18  Temp:  36.9 C  SpO2:  96%    Last Pain:  Vitals:   07/08/21 1553  TempSrc: Oral  PainSc: 10-Worst pain ever   Pain Goal: Patients Stated Pain Goal: 0 (07/08/21 1553)                 Cephus Shelling

## 2021-07-08 NOTE — Anesthesia Preprocedure Evaluation (Signed)
Anesthesia Evaluation  Patient identified by MRN, date of birth, ID band Patient awake    Reviewed: Allergy & Precautions, H&P , NPO status , Patient's Chart, lab work & pertinent test results  History of Anesthesia Complications Negative for: history of anesthetic complications  Airway Mallampati: II  TM Distance: >3 FB Neck ROM: full    Dental no notable dental hx. (+) Teeth Intact   Pulmonary Current Smoker,    Pulmonary exam normal breath sounds clear to auscultation       Cardiovascular hypertension, Pt. on home beta blockers and Pt. on medications Normal cardiovascular exam Rhythm:regular Rate:Normal     Neuro/Psych  Headaches, PSYCHIATRIC DISORDERS Anxiety Depression    GI/Hepatic negative GI ROS, Neg liver ROS,   Endo/Other  negative endocrine ROS  Renal/GU negative Renal ROS  negative genitourinary   Musculoskeletal   Abdominal (+) + obese,   Peds  Hematology  (+) Blood dyscrasia, anemia ,   Anesthesia Other Findings   Reproductive/Obstetrics (+) Pregnancy                             Anesthesia Physical Anesthesia Plan  ASA: 3  Anesthesia Plan: Epidural   Post-op Pain Management:    Induction:   PONV Risk Score and Plan:   Airway Management Planned:   Additional Equipment:   Intra-op Plan:   Post-operative Plan:   Informed Consent: I have reviewed the patients History and Physical, chart, labs and discussed the procedure including the risks, benefits and alternatives for the proposed anesthesia with the patient or authorized representative who has indicated his/her understanding and acceptance.       Plan Discussed with:   Anesthesia Plan Comments:         Anesthesia Quick Evaluation

## 2021-07-08 NOTE — Progress Notes (Signed)
MOB was referred for history of depression/anxiety. * Referral screened out by Clinical Social Worker because none of the following criteria appear to apply: ~ History of anxiety/depression during this pregnancy, or of post-partum depression following prior delivery. ~ Diagnosis of anxiety and/or depression within last 3 years OR * MOB's symptoms currently being treated with medication and/or therapy. Please contact the Clinical Social Worker if needs arise, by MOB request, or if MOB scores greater than 9/yes to question 10 on Edinburgh Postpartum Depression Screen.  Karri Kallenbach Boyd-Gilyard, MSW, LCSW Clinical Social Work [phone]  

## 2021-07-08 NOTE — Anesthesia Procedure Notes (Signed)
Epidural Patient location during procedure: OB Start time: 07/08/2021 1:25 AM End time: 07/08/2021 1:35 AM  Staffing Anesthesiologist: Leonides Grills, MD Performed: anesthesiologist   Preanesthetic Checklist Completed: patient identified, IV checked, site marked, risks and benefits discussed, monitors and equipment checked, pre-op evaluation and timeout performed  Epidural Patient position: sitting Prep: DuraPrep Patient monitoring: heart rate, cardiac monitor, continuous pulse ox and blood pressure Approach: midline Location: L4-L5 Injection technique: LOR air  Needle:  Needle type: Tuohy  Needle gauge: 17 G Needle length: 9 cm Needle insertion depth: 9 cm Catheter type: closed end flexible Catheter size: 19 Gauge Catheter at skin depth: 15 cm Test dose: negative and Other  Assessment Events: blood not aspirated, injection not painful, no injection resistance and negative IV test  Additional Notes Informed consent obtained prior to proceeding including risk of failure, 1% risk of PDPH, risk of minor discomfort and bruising. Discussed alternatives to epidural analgesia and patient desires to proceed.  Timeout performed pre-procedure verifying patient name, procedure, and platelet count.  Patient tolerated procedure well. Reason for block:procedure for pain

## 2021-07-09 LAB — COMPREHENSIVE METABOLIC PANEL
ALT: 8 U/L (ref 0–44)
AST: 14 U/L — ABNORMAL LOW (ref 15–41)
Albumin: 2.5 g/dL — ABNORMAL LOW (ref 3.5–5.0)
Alkaline Phosphatase: 57 U/L (ref 38–126)
Anion gap: 6 (ref 5–15)
BUN: <5 mg/dL — ABNORMAL LOW (ref 6–20)
CO2: 24 mmol/L (ref 22–32)
Calcium: 7.6 mg/dL — ABNORMAL LOW (ref 8.9–10.3)
Chloride: 104 mmol/L (ref 98–111)
Creatinine, Ser: 0.51 mg/dL (ref 0.44–1.00)
GFR, Estimated: >60 mL/min (ref >60)
Glucose, Bld: 100 mg/dL — ABNORMAL HIGH (ref 70–99)
Potassium: 3.3 mmol/L — ABNORMAL LOW (ref 3.5–5.1)
Sodium: 134 mmol/L — ABNORMAL LOW (ref 135–145)
Total Bilirubin: 0.3 mg/dL (ref 0.3–1.2)
Total Protein: 5.8 g/dL — ABNORMAL LOW (ref 6.5–8.1)

## 2021-07-09 LAB — CULTURE, BETA STREP (GROUP B ONLY)

## 2021-07-09 LAB — SURGICAL PATHOLOGY

## 2021-07-09 LAB — CBC
HCT: 26.7 % — ABNORMAL LOW (ref 36.0–46.0)
Hemoglobin: 8.9 g/dL — ABNORMAL LOW (ref 12.0–15.0)
MCH: 31.4 pg (ref 26.0–34.0)
MCHC: 33.3 g/dL (ref 30.0–36.0)
MCV: 94.3 fL (ref 80.0–100.0)
Platelets: 251 10*3/uL (ref 150–400)
RBC: 2.83 MIL/uL — ABNORMAL LOW (ref 3.87–5.11)
RDW: 12.9 % (ref 11.5–15.5)
WBC: 11.6 10*3/uL — ABNORMAL HIGH (ref 4.0–10.5)
nRBC: 0 % (ref 0.0–0.2)

## 2021-07-09 MED ORDER — POTASSIUM CHLORIDE CRYS ER 20 MEQ PO TBCR
20.0000 meq | EXTENDED_RELEASE_TABLET | Freq: Two times a day (BID) | ORAL | Status: AC
  Administered 2021-07-09 – 2021-07-10 (×3): 20 meq via ORAL
  Filled 2021-07-09 (×3): qty 1

## 2021-07-09 MED ORDER — FERROUS SULFATE 325 (65 FE) MG PO TABS
325.0000 mg | ORAL_TABLET | ORAL | Status: DC
  Administered 2021-07-10: 325 mg via ORAL
  Filled 2021-07-09: qty 1

## 2021-07-09 MED ORDER — VITAMIN C 250 MG PO TABS
250.0000 mg | ORAL_TABLET | ORAL | Status: DC
  Administered 2021-07-10: 250 mg via ORAL
  Filled 2021-07-09: qty 1

## 2021-07-09 MED ORDER — BUTALBITAL-APAP-CAFFEINE 50-325-40 MG PO TABS
2.0000 | ORAL_TABLET | Freq: Three times a day (TID) | ORAL | Status: DC | PRN
  Administered 2021-07-09 – 2021-07-11 (×4): 2 via ORAL
  Filled 2021-07-09 (×4): qty 2

## 2021-07-09 MED ORDER — MEDROXYPROGESTERONE ACETATE 150 MG/ML IM SUSP
150.0000 mg | Freq: Once | INTRAMUSCULAR | Status: AC
  Administered 2021-07-10: 150 mg via INTRAMUSCULAR
  Filled 2021-07-09: qty 1

## 2021-07-09 NOTE — Progress Notes (Signed)
Mellissa Kohut RN was transferring patient to [facility]. When transferring patient from the wheelchair to the bed the patients legs gave out. Thurston Hole was able to catch the patient and lowered her to the floor. She sat her on her buttocks. No injury was noted.

## 2021-07-09 NOTE — Progress Notes (Signed)
Called by [facility] Anesthesiologist and notified of patient complaining of back pain. Patient evaluated in room with Dr. Desmond Lope. Patient feeding infant without distress. Patient reports some decreased strength with left leg movement. Motor and sensation intact to bilateral lower extremities. Lumbar area examined as well. Area non tender to palpation, no drainage or erythema. Findings discussed with OB attending. Will defer follow up to primary team.

## 2021-07-09 NOTE — Progress Notes (Signed)
Post Partum Day 1 Subjective: up ad lib, tolerating PO, and headache  Objective: Blood pressure (!) 142/92, pulse 94, temperature 98.5 F (36.9 C), temperature source Oral, resp. rate 18, height 5\' 6"  (1.676 m), weight 96.1 kg, last menstrual period 10/23/2020, SpO2 99 %, unknown if currently breastfeeding.  Physical Exam:  General: alert, cooperative, and no distress Lochia: appropriate Uterine Fundus: firm DVT Evaluation: No evidence of DVT seen on physical exam.  Recent Labs    07/08/21 0036 07/09/21 0459  HGB 9.2* 8.9*  HCT 27.3* 26.7*   CMP Latest Ref Rng & Units 07/09/2021 07/07/2021 06/19/2021  Glucose 70 - 99 mg/dL 06/21/2021) 77 96  BUN 6 - 20 mg/dL 253(G) <6(Y) <4(I)  Creatinine 0.44 - 1.00 mg/dL <3(K 7.42 5.95  Sodium 135 - 145 mmol/L 134(L) 136 132(L)  Potassium 3.5 - 5.1 mmol/L 3.3(L) 2.9(L) 3.3(L)  Chloride 98 - 111 mmol/L 104 104 104  CO2 22 - 32 mmol/L 24 23 21(L)  Calcium 8.9 - 10.3 mg/dL 7.6(L) 8.3(L) 8.2(L)  Total Protein 6.5 - 8.1 g/dL 6.38) 6.0(L) 6.5  Total Bilirubin 0.3 - 1.2 mg/dL 0.3 0.5 7.5(I)  Alkaline Phos 38 - 126 U/L 57 59 51  AST 15 - 41 U/L 14(L) 14(L) 18  ALT 0 - 44 U/L 8 8 10     Assessment/Plan: Plan for discharge tomorrow. Follow for resolution of her headache   LOS: 2 days   4.3(P 07/09/2021, 2:47 PM

## 2021-07-09 NOTE — Addendum Note (Signed)
Addendum  created 07/09/21 1003 by Leonides Grills, MD   Clinical Note Signed

## 2021-07-09 NOTE — Progress Notes (Signed)
Spoke with Dr. Desmond Lope, [facility] anesthesiologist regarding pt's c/o severe back pain. OB anesthesiologist to come evaluate pt at bedside.

## 2021-07-10 MED ORDER — TRIAMTERENE-HCTZ 37.5-25 MG PO TABS
1.0000 | ORAL_TABLET | Freq: Every day | ORAL | Status: DC
  Filled 2021-07-10: qty 1

## 2021-07-10 MED ORDER — TRIAMTERENE-HCTZ 37.5-25 MG PO TABS
1.0000 | ORAL_TABLET | Freq: Every day | ORAL | Status: DC
  Administered 2021-07-10 – 2021-07-11 (×2): 1 via ORAL
  Filled 2021-07-10 (×2): qty 1

## 2021-07-10 MED ORDER — LISINOPRIL 10 MG PO TABS
10.0000 mg | ORAL_TABLET | Freq: Every day | ORAL | Status: DC
  Administered 2021-07-10 – 2021-07-11 (×2): 10 mg via ORAL
  Filled 2021-07-10 (×2): qty 1

## 2021-07-10 MED ORDER — OXYCODONE HCL 5 MG PO TABS
5.0000 mg | ORAL_TABLET | ORAL | Status: DC | PRN
  Administered 2021-07-10 – 2021-07-11 (×5): 10 mg via ORAL
  Filled 2021-07-10 (×5): qty 2

## 2021-07-10 NOTE — Progress Notes (Signed)
POSTPARTUM PROGRESS NOTE  PPD #2  Subjective:  Paula Dominguez is a 40 y.o. [id number] s/p NSVD at [redacted]w[redacted]d. Today she notes that she is overall doing well. She denies any problems with ambulating, voiding or po intake. Denies nausea or vomiting. She has passed flatus, no BM.  Pain is mostly controlled- still noting contraction pain.  Lochia appropriate Denies fever/chills/chest pain/SOB.  mild HA- 2/10 per pt improved after oral iron, no blurry vision, noRUQ pain  Objective: Blood pressure (!) 155/97, pulse 85, temperature 98.7 F (37.1 C), temperature source Oral, resp. rate 16, height 5\' 6"  (1.676 m), weight 96.1 kg, last menstrual period 10/23/2020, SpO2 98 %, unknown if currently breastfeeding. BP range: 142-155/86-97  Physical Exam:  General: alert, cooperative and no distress Chest: no respiratory distress Heart: regular rate and rhythm Abdomen: soft, nontender Uterine Fundus: firm, appropriately tender Incision: n/a DVT Evaluation: No calf swelling or tenderness Extremities: 1+ edema Skin: warm, dry  No results found for this or any previous visit (from the past 24 hour(s)).  Assessment/Plan: Paula Dominguez is a 40 y.o. 46 s/p NSVD at [redacted]w[redacted]d PPD#1 complicated by: 1) cHTN with superimposed preeclampsia -s/p Mag x 24hr -BP elevated- currently on ProcardiaXL 60mg  bid -added Maxzide today, will continue in-hospital monitoring  Contraception: [place] Feeding: bottle feeding  Dispo: Continue routine postpartum care, change in BP medication as above, will continue in-house monitoring   LOS: 3 days   [redacted]w[redacted]d, DO Faculty Attending, Center for [facility] 07/10/2021, 9:03 AM

## 2021-07-11 MED ORDER — OXYCODONE HCL 5 MG PO TABS: 5.0000 mg | ORAL_TABLET | Freq: Four times a day (QID) | ORAL | 0 refills | Status: DC | PRN

## 2021-07-11 MED ORDER — IBUPROFEN 600 MG PO TABS
600.0000 mg | ORAL_TABLET | Freq: Four times a day (QID) | ORAL | 0 refills | Status: DC | PRN
Start: 2021-07-11 — End: 2022-12-01

## 2021-07-11 MED ORDER — OXYCODONE HCL 5 MG PO TABS: 5.0000 mg | ORAL_TABLET | Freq: Four times a day (QID) | ORAL | 0 refills | Status: AC | PRN

## 2021-07-11 MED ORDER — NIFEDIPINE ER 60 MG PO TB24: 60.0000 mg | ORAL_TABLET | Freq: Two times a day (BID) | ORAL | 3 refills | Status: DC

## 2021-07-11 MED ORDER — BUTALBITAL-APAP-CAFFEINE 50-325-40 MG PO TABS: 2.0000 | ORAL_TABLET | Freq: Three times a day (TID) | ORAL | 0 refills | Status: DC | PRN

## 2021-07-11 MED ORDER — BUTALBITAL-APAP-CAFFEINE 50-325-40 MG PO TABS: 1.0000 | ORAL_TABLET | Freq: Two times a day (BID) | ORAL | 0 refills | Status: DC | PRN

## 2021-07-11 MED ORDER — ASCORBIC ACID 250 MG PO TABS: 250.0000 mg | ORAL_TABLET | ORAL | 0 refills | Status: AC

## 2021-07-11 MED ORDER — FERROUS SULFATE 325 (65 FE) MG PO TABS: 325.0000 mg | ORAL_TABLET | ORAL | 3 refills | Status: DC

## 2021-07-11 MED ORDER — TRIAMTERENE-HCTZ 37.5-25 MG PO TABS: 1.0000 | ORAL_TABLET | Freq: Every day | ORAL | 0 refills | Status: DC

## 2021-07-11 MED ORDER — NICOTINE 21 MG/24HR TD PT24: 21.0000 mg | MEDICATED_PATCH | Freq: Every day | TRANSDERMAL | 0 refills | Status: DC

## 2021-07-11 NOTE — Progress Notes (Signed)
OB Note Pharmacy closed but [facility] at another was able to pull all the others except the narcotics. VM left at 1st pharmacy to cancel Rx and Rx re-sent to new one in [place]  Cornelia Copa MD Attending [facility] [employer] (Faculty Practice) 07/11/2021 Time: [phone]

## 2021-07-15 ENCOUNTER — Ambulatory Visit: Payer: 59

## 2021-07-19 ENCOUNTER — Other Ambulatory Visit: Payer: Self-pay

## 2021-07-19 ENCOUNTER — Ambulatory Visit (INDEPENDENT_AMBULATORY_CARE_PROVIDER_SITE_OTHER): Payer: 59

## 2021-07-19 DIAGNOSIS — Z0130 Encounter for examination of blood pressure without abnormal findings: Secondary | ICD-10-CM

## 2021-07-19 NOTE — Progress Notes (Signed)
Subjective:  Paula Dominguez is a 40 y.o. female here for BP check.   Hypertension ROS: taking medications as instructed, no medication side effects noted, no TIA's, no chest pain on exertion, no dyspnea on exertion, and no swelling of ankles.    Objective:  BP 135/88   Pulse 93   Wt 194 lb (88 kg)   LMP 10/23/2020   Breastfeeding No   BMI 31.31 kg/m   Appearance alert, well appearing, and in no distress. General exam BP noted to be well controlled today in office.    Assessment:   Blood Pressure well controlled. 1 week PP, not breastfeeding.  Plan:  Current treatment plan is effective, no change in therapy. Keep PP appt.

## 2021-07-20 ENCOUNTER — Telehealth (HOSPITAL_COMMUNITY): Payer: Self-pay | Admitting: *Deleted

## 2021-07-20 NOTE — Telephone Encounter (Signed)
Unable to contact patient for hospital discharge follow-up call.  Duffy Rhody, RN 07/20/2021 at 3:05pm

## 2021-08-04 ENCOUNTER — Telehealth: Payer: Self-pay

## 2021-08-04 NOTE — Telephone Encounter (Signed)
Attempt to reach pt to f/U on B/P reading  Connect nurse called and stated pt B/P was elevated 162/118 Pt has no sx's Just abdominal pai, and right leg pain Pt is working out x 2 hrs a day , sit-ups, jumping jacks etc Nurse advised pt to stop work outs at this time. I was not able to reach pt or leave a vm.

## 2021-08-06 ENCOUNTER — Inpatient Hospital Stay (HOSPITAL_COMMUNITY): Admit: 2021-08-06 | Payer: Self-pay

## 2021-08-11 ENCOUNTER — Other Ambulatory Visit: Payer: Self-pay

## 2021-08-11 ENCOUNTER — Ambulatory Visit: Payer: Self-pay

## 2021-08-11 NOTE — Patient Outreach (Signed)
Care Coordination  08/11/2021  Paula Dominguez 04-14-1981 [id number]  This RNCM attempted two phone calls prior to reaching the patient on a 3rd attempt.  Patient answered the phone call but she requested to reschedule this phone contact to a different day.  Patient requested that RNCM contact her on Tuesday 08/17/2021 at 10:00 am.  I agreed with this plan to reschedule for requested date and time.  Message sent to Weston Settle, [facility] Care Guide Scheduler, to reschedule today's visit for next week on 08/17/2021 at 10:00 am.  Paula Norfolk RN, BSN [facility] Coordinator [facility]  [facility] Mobile: [phone]

## 2021-08-13 ENCOUNTER — Ambulatory Visit (INDEPENDENT_AMBULATORY_CARE_PROVIDER_SITE_OTHER): Payer: 59 | Admitting: Obstetrics and Gynecology

## 2021-08-13 ENCOUNTER — Other Ambulatory Visit: Payer: Self-pay

## 2021-08-13 ENCOUNTER — Encounter: Payer: Self-pay | Admitting: Obstetrics and Gynecology

## 2021-08-13 DIAGNOSIS — O10919 Unspecified pre-existing hypertension complicating pregnancy, unspecified trimester: Secondary | ICD-10-CM | POA: Insufficient documentation

## 2021-08-13 DIAGNOSIS — I10 Essential (primary) hypertension: Secondary | ICD-10-CM

## 2021-08-13 MED ORDER — LISINOPRIL 10 MG PO TABS: 10.0000 mg | ORAL_TABLET | Freq: Every day | ORAL | 1 refills | Status: DC

## 2021-08-13 MED ORDER — TRIAMTERENE-HCTZ 37.5-25 MG PO TABS: 1.0000 | ORAL_TABLET | Freq: Every day | ORAL | 0 refills | Status: DC

## 2021-08-13 NOTE — Patient Instructions (Signed)
Health Maintenance, Female Adopting a healthy lifestyle and getting preventive care are important in promoting health and wellness. Ask your health care provider about: The right schedule for you to have regular tests and exams. Things you can do on your own to prevent diseases and keep yourself healthy. What should I know about diet, weight, and exercise? Eat a healthy diet  Eat a diet that includes plenty of vegetables, fruits, low-fat dairy products, and lean protein. Do not eat a lot of foods that are high in solid fats, added sugars, or sodium.  Maintain a healthy weight Body mass index (BMI) is used to identify weight problems. It estimates body fat based on height and weight. Your health care provider can help determineyour BMI and help you achieve or maintain a healthy weight. Get regular exercise Get regular exercise. This is one of the most important things you can do for your health. Most adults should: Exercise for at least 150 minutes each week. The exercise should increase your heart rate and make you sweat (moderate-intensity exercise). Do strengthening exercises at least twice a week. This is in addition to the moderate-intensity exercise. Spend less time sitting. Even light physical activity can be beneficial. Watch cholesterol and blood lipids Have your blood tested for lipids and cholesterol at 41 years of age, then havethis test every 5 years. Have your cholesterol levels checked more often if: Your lipid or cholesterol levels are high. You are older than 40 years of age. You are at high risk for heart disease. What should I know about cancer screening? Depending on your health history and family history, you may need to have cancer screening at various ages. This may include screening for: Breast cancer. Cervical cancer. Colorectal cancer. Skin cancer. Lung cancer. What should I know about heart disease, diabetes, and high blood pressure? Blood pressure and heart  disease High blood pressure causes heart disease and increases the risk of stroke. This is more likely to develop in people who have high blood pressure readings, are of African descent, or are overweight. Have your blood pressure checked: Every 3-5 years if you are 18-39 years of age. Every year if you are 40 years old or older. Diabetes Have regular diabetes screenings. This checks your fasting blood sugar level. Have the screening done: Once every three years after age 40 if you are at a normal weight and have a low risk for diabetes. More often and at a younger age if you are overweight or have a high risk for diabetes. What should I know about preventing infection? Hepatitis B If you have a higher risk for hepatitis B, you should be screened for this virus. Talk with your health care provider to find out if you are at risk forhepatitis B infection. Hepatitis C Testing is recommended for: Everyone born from 1945 through 1965. Anyone with known risk factors for hepatitis C. Sexually transmitted infections (STIs) Get screened for STIs, including gonorrhea and chlamydia, if: You are sexually active and are younger than 41 years of age. You are older than 41 years of age and your health care provider tells you that you are at risk for this type of infection. Your sexual activity has changed since you were last screened, and you are at increased risk for chlamydia or gonorrhea. Ask your health care provider if you are at risk. Ask your health care provider about whether you are at high risk for HIV. Your health care provider may recommend a prescription medicine to help   prevent HIV infection. If you choose to take medicine to prevent HIV, you should first get tested for HIV. You should then be tested every 3 months for as long as you are taking the medicine. Pregnancy If you are about to stop having your period (premenopausal) and you may become pregnant, seek counseling before you get  pregnant. Take 400 to 800 micrograms (mcg) of folic acid every day if you become pregnant. Ask for birth control (contraception) if you want to prevent pregnancy. Osteoporosis and menopause Osteoporosis is a disease in which the bones lose minerals and strength with aging. This can result in bone fractures. If you are 65 years old or older, or if you are at risk for osteoporosis and fractures, ask your health care provider if you should: Be screened for bone loss. Take a calcium or vitamin D supplement to lower your risk of fractures. Be given hormone replacement therapy (HRT) to treat symptoms of menopause. Follow these instructions at home: Lifestyle Do not use any products that contain nicotine or tobacco, such as cigarettes, e-cigarettes, and chewing tobacco. If you need help quitting, ask your health care provider. Do not use street drugs. Do not share needles. Ask your health care provider for help if you need support or information about quitting drugs. Alcohol use Do not drink alcohol if: Your health care provider tells you not to drink. You are pregnant, may be pregnant, or are planning to become pregnant. If you drink alcohol: Limit how much you use to 0-1 drink a day. Limit intake if you are breastfeeding. Be aware of how much alcohol is in your drink. In the [place] one drink equals one 12 oz bottle of beer (355 mL), one 5 oz glass of wine (148 mL), or one 1 oz glass of hard liquor (44 mL). General instructions Schedule regular health, dental, and eye exams. Stay current with your vaccines. Tell your health care provider if: You often feel depressed. You have ever been abused or do not feel safe at home. Summary Adopting a healthy lifestyle and getting preventive care are important in promoting health and wellness. Follow your health care provider's instructions about healthy diet, exercising, and getting tested or screened for diseases. Follow your health care provider's  instructions on monitoring your cholesterol and blood pressure. This information is not intended to replace advice given to you by your health care provider. Make sure you discuss any questions you have with your healthcare provider. Document Revised: 12/05/2018 Document Reviewed: 12/05/2018 Elsevier Patient Education  2022 Elsevier Inc.  

## 2021-08-13 NOTE — Progress Notes (Signed)
    Post Partum Visit Note  Paula Dominguez is a 40 y.o. [id number] female who presents for a postpartum visit. She is 5 weeks postpartum following a normal spontaneous vaginal delivery.  I have fully reviewed the prenatal and intrapartum course. The delivery was at [redacted]w[redacted]d gestational weeks.  Anesthesia: epidural. Postpartum course has been complicated by chronic hypertension. Baby is doing well. Baby is feeding by bottle - Similac Neosure. Bleeding no bleeding. Bowel function is normal. Bladder function is normal. Patient is not sexually active. Contraception method is Depo-Provera injections. Postpartum depression screening: negative. EPDS: 0   The pregnancy intention screening data noted above was reviewed. Potential methods of contraception were discussed. The patient elected to proceed with No data recorded.     Medical Records  Review of Systems Pertinent items noted in HPI and remainder of comprehensive ROS otherwise negative.    Objective:  LMP 10/23/2020    General:  alert   Breasts:  Not indicated  Lungs: clear to auscultation bilaterally  Heart:  regular rate and rhythm, S1, S2 normal, no murmur, click, rub or gallop  Abdomen: soft, non-tender; bowel sounds normal; no masses,  no organomegaly   Vulva:  not evaluated  Vagina: not evaluated  Cervix:  Not evaluated  Corpus: not examined  Adnexa:  not evaluated  Rectal Exam: Not performed.        Assessment:    NL postpartum exam. Pap smear not done at today's visit.   Chronic hypertension Plan:   Essential components of care per ACOG recommendations:  1.  Mood and well being: Patient with negative depression screening today. Reviewed local resources for support.  - Patient does use tobacco.  we discussed reduction and for recently cessation risk of relapse - hx of drug use? No    2. Infant care and feeding:  -Patient currently breastmilk feeding? No -Social determinants of health (SDOH) reviewed in EPIC. No  concerns  3. Sexuality, contraception and birth spacing - Patient does not want a pregnancy in the next year.  Desired family size is uncertain  - Reviewed forms of contraception in tiered fashion. Patient desired Depo-Provera today.   - Discussed birth spacing of 18 months  4. Sleep and fatigue -Encouraged family/partner/community support of 4 hrs of uninterrupted sleep to help with mood and fatigue  5. Physical Recovery  - Discussed patients delivery and complications - Patient had a no  laceration. Patient expressed understanding - Patient has urinary incontinence? No - Patient is safe to resume physical and sexual activity  6.  Health Maintenance - Last pap smear done 2/22 and was  ASCUS, HPV negative  with negative HPV.   7. Chronic Disease - PCP follow up  Will add Lisinopril to pt BP regiment Pt instructed to follow up with PCP in next 1-2 weeks for BP management  Nettie Elm, MD [facility] [employer], [facility]

## 2021-08-17 ENCOUNTER — Other Ambulatory Visit: Payer: Self-pay

## 2021-08-17 NOTE — Patient Instructions (Signed)
08/17/2021 Name: Paula Dominguez MRN: [id number] DOB: Sep 16, 1981  Referred by: Lester Platter., MD Reason for referral : High Risk Managed Medicaid (Unsuccessful Telephone Outreach)   An unsuccessful telephone outreach was attempted today. The patient was referred to the case management team for assistance with care management and care coordination.    Follow Up Plan: A HIPAA compliant phone message was left for the patient providing contact information and requesting a return call. and The Managed Medicaid care management team will reach out to the patient again over the next 7 days.    Virgina Norfolk RN, BSN Community Care Coordinator [facility]  [facility] Mobile: [phone]

## 2021-08-17 NOTE — Patient Outreach (Signed)
Care Coordination  08/17/2021  Paula Dominguez 08-09-1981 [id number]  08/17/2021 Name: Paula Dominguez MRN: [id number] DOB: Nov 25, 1981  Referred by: Lester [place]., MD Reason for referral : High Risk Managed Medicaid (Unsuccessful Telephone Outreach)   An unsuccessful telephone outreach was attempted today. The patient was referred to the case management team for assistance with care management and care coordination.    Follow Up Plan: A HIPAA compliant phone message was left for the patient providing contact information and requesting a return call.     Virgina Norfolk RN, BSN Community Care Coordinator [facility]  [facility] Mobile: [phone]

## 2021-08-18 ENCOUNTER — Telehealth: Payer: Self-pay | Admitting: Family Medicine

## 2021-08-18 NOTE — Telephone Encounter (Signed)
..   Medicaid Managed Care   Unsuccessful Outreach Note  08/18/2021 Name: Paula Dominguez MRN: [id number] DOB: Mar 03, 1981  Referred by: Lester [place]., MD Reason for referral : High Risk Managed Medicaid and Appointment (I called the patient today to get her phone visit with the MM RNCM rescheduled. I left my name and number on her VM.)   An unsuccessful telephone outreach was attempted today. The patient was referred to the case management team for assistance with care management and care coordination.   Follow Up Plan: The care management team will reach out to the patient again over the next 7-14 days.   Weston Settle Care Guide, High Risk Medicaid Managed Care Embedded Care Coordination [facility]  [facility]

## 2021-08-25 ENCOUNTER — Telehealth: Payer: Self-pay | Admitting: Family Medicine

## 2021-08-25 NOTE — Telephone Encounter (Signed)
..   Medicaid Managed Care   Unsuccessful Outreach Note  08/25/2021 Name: Paula Dominguez MRN: [id number] DOB: 10/05/1981  Referred by: Lester [place]., MD Reason for referral : High Risk Managed Medicaid (I called the patient today to get her phone visit with the [facility] rescheduled but the call would not go through.)   A second unsuccessful telephone outreach was attempted today. The patient was referred to the case management team for assistance with care management and care coordination.   Follow Up Plan: The care management team will reach out to the patient again over the next 7 days.   Weston Settle Care Guide, High Risk Medicaid Managed Care Embedded Care Coordination [facility]  [facility]

## 2021-09-27 ENCOUNTER — Ambulatory Visit: Payer: 59

## 2022-02-26 IMAGING — US US MFM FETAL BPP W/O NON-STRESS
1 series · 12 of 28 positions shown · non-contrast
Comparison: none

[Series 1: us mfm fetal bpp w/o non-stress · 54 acquisitions, 12 frames shown]
[im 2/54]
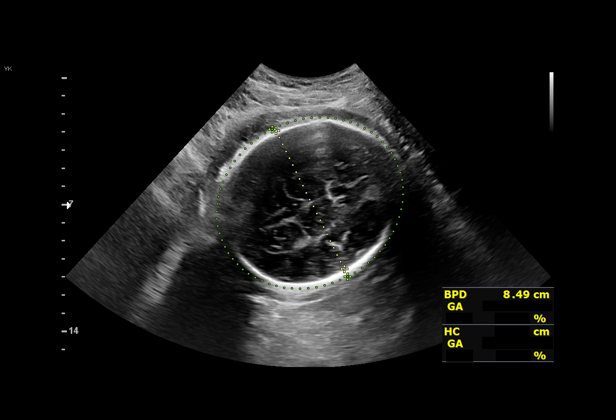
[im 6/54]
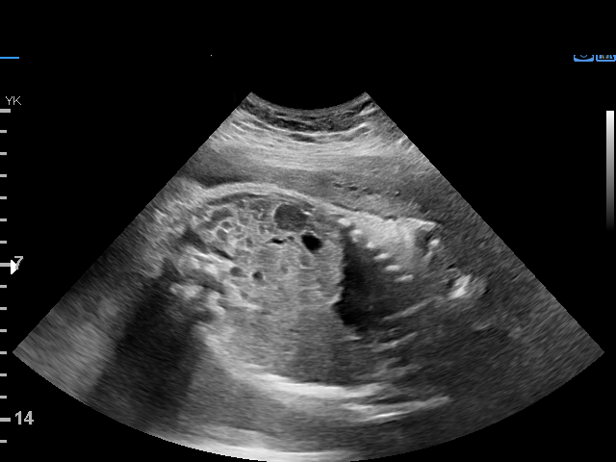
[im 10/54]
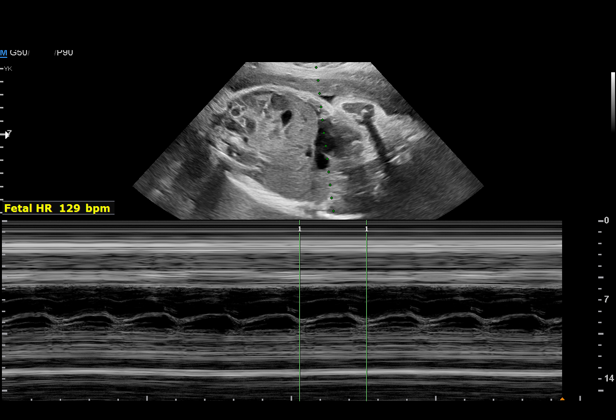
[im 16/54]
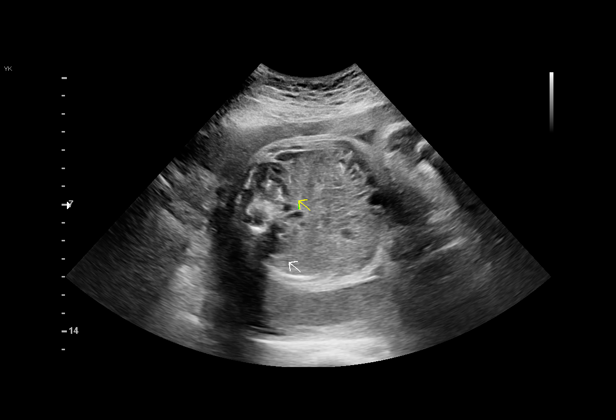
[im 20/54]
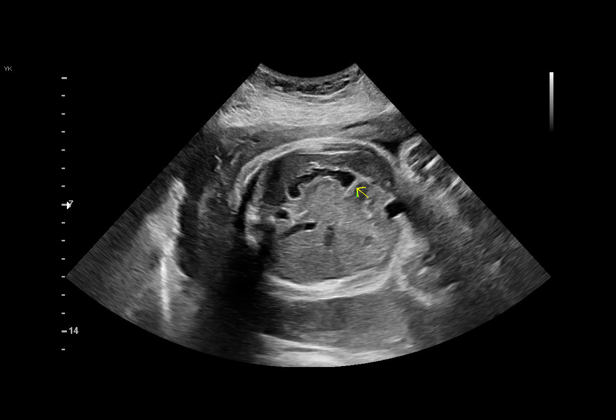
[im 24/54]
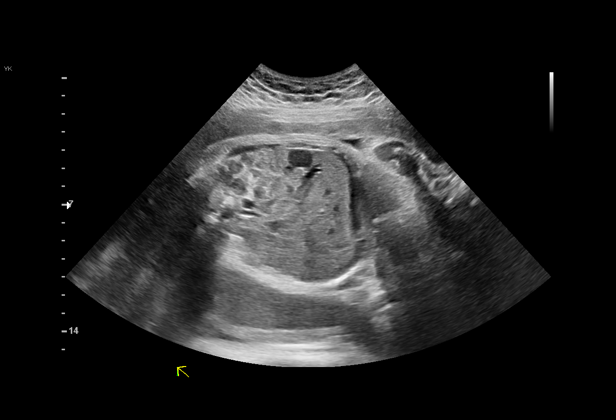
[im 30/54]
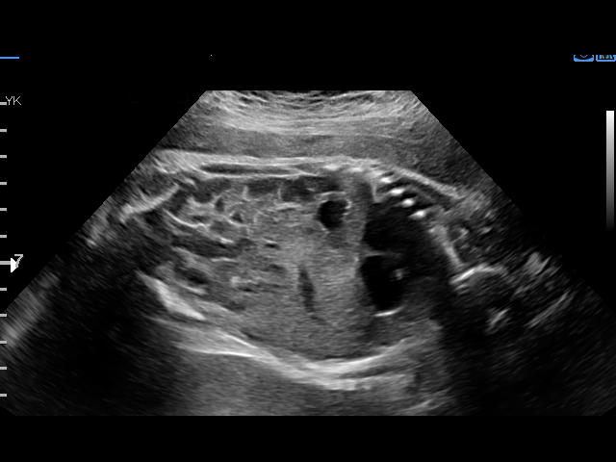
[im 34/54]
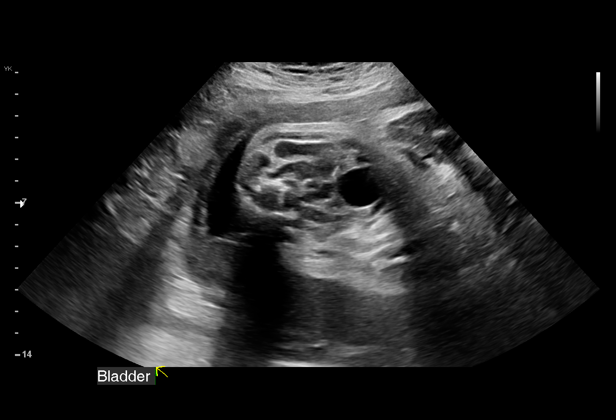
[im 38/54]
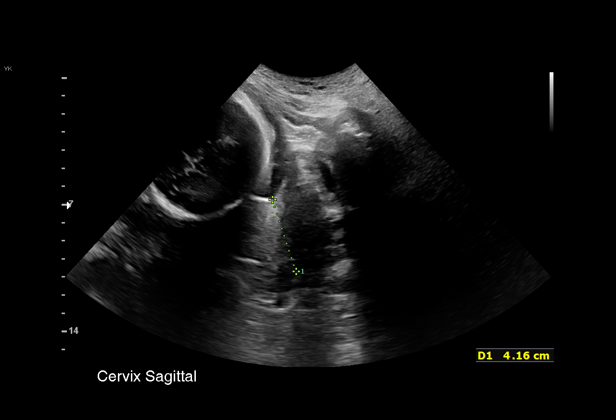
[im 44/54]
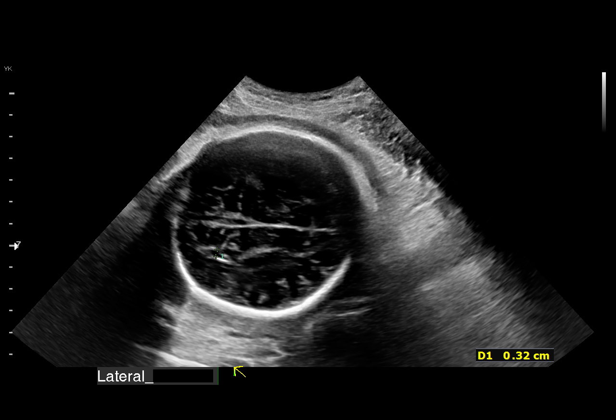
[im 48/54]
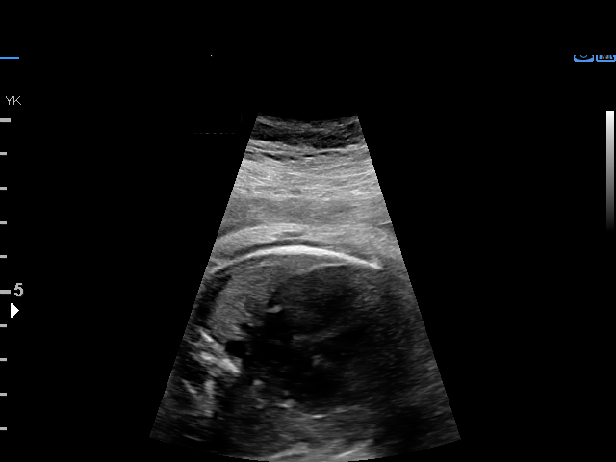
[im 52/54]
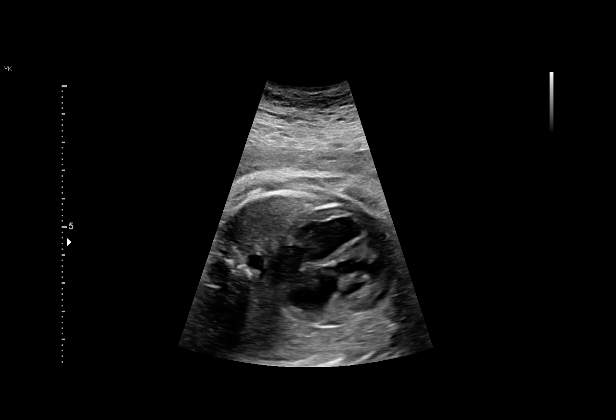

[12 of 28 positions shown; findings below may reference images not displayed]

Addendum:\.br----------------------------------------------------------------------
----------------------------------------------------------------------

----------------------------------------------------------------------

                                                            [REDACTED]
                   JVANVLIET CNM
----------------------------------------------------------------------

                                                      BOSTAD
----------------------------------------------------------------------

----------------------------------------------------------------------
Indications

 Hypertension - Chronic/Pre-
 existing(nifedipine and labetalol)
 Advanced maternal age multigravida 35+,
 second trimester(39 yrs)
 Obesity complicating pregnancy, second
 trimester
 Poor obstetric history: Previous
 preeclampsia / eclampsia/gestational HTN
 Poor obstetrical history (Postpartum
 Hemorrhage)
 Low Risk NIPS(Negative AFP)
 34 weeks gestation of pregnancy
----------------------------------------------------------------------
Fetal Evaluation

 Num Of Fetuses:         1
 Fetal Heart Rate(bpm):  142
 Cardiac Activity:       Observed
 Presentation:           Cephalic
 Placenta:               Posterior Fundal
 P. Cord Insertion:      Previously Visualized
 Amniotic Fluid
 AFI FV:      Within normal limits

 AFI Sum(cm)     %Tile       Largest Pocket(cm)
 12.8            41

 RUQ(cm)       RLQ(cm)       LUQ(cm)        LLQ(cm)

----------------------------------------------------------------------
Biophysical Evaluation

 Amniotic F.V:   Within normal limits       F. Tone:        Observed
 F. Movement:    Observed                   Score:          [DATE]
 F. Breathing:   Observed
----------------------------------------------------------------------
Biometry

 BPD:      85.6  mm     G. Age:  34w 4d         41  %    CI:        74.37   %    70 - 86
                                                         FL/HC:      21.4   %    20.1 -
 HC:      315.1  mm     G. Age:  35w 3d         28  %    HC/AC:      1.03        0.93 -
 AC:      305.3  mm     G. Age:  34w 3d         45  %    FL/BPD:     78.9   %    71 - 87
 FL:       67.5  mm     G. Age:  34w 5d         38  %    FL/AC:      22.1   %    20 - 24

 Est. FW:    4058  gm      5 lb 7 oz     40  %
----------------------------------------------------------------------
OB History

 Gravidity:    9         Term:   7        Prem:   0        SAB:   1
 TOP:          0       Ectopic:  0        Living: 7
----------------------------------------------------------------------
Gestational Age

 LMP:           35w 6d        Date:  10/23/20                 EDD:   07/30/21
 U/S Today:     34w 6d                                        EDD:   08/06/21
 Best:          34w 6d     Det. By:  U/S  (02/11/21)          EDD:   08/06/21
----------------------------------------------------------------------
Anatomy

 Cranium:               Appears normal         Aortic Arch:            Previously seen
 Cavum:                 Appears normal         Ductal Arch:            Previously seen
 Ventricles:            Appears normal         Diaphragm:              Appears normal
 Choroid Plexus:        Previously seen        Stomach:                Appears normal, left
                                                                       sided
 Cerebellum:            Appears normal         Abdomen:                Appears normal
 Posterior Fossa:       Appears normal         Abdominal Wall:         Previously seen
 Nuchal Fold:           Previously seen        Cord Vessels:           Appears normal (3
                                                                       vessel cord)
 Face:                  Orbits and profile     Kidneys:                Appear normal
                        previously seen
 Lips:                  Previously seen        Bladder:                Appears normal
 Thoracic:              Appears normal         Spine:                  Previously seen
 Heart:                 Appears normal         Upper Extremities:      Previously seen
                        (4CH, axis, and
                        situs)
 RVOT:                  Previously seen        Lower Extremities:      Previously seen
 LVOT:                  Previously seen

 Other:  Fetus previously appears to be female. Nasal bone, Heels and 5th
         digit previously visualized.
----------------------------------------------------------------------
Cervix Uterus Adnexa

 Cervix
 Normal appearance by transabdominal scan.
----------------------------------------------------------------------
Impression

 Chronic hypertension.  Patient takes labetalol and Procardia.
 Blood pressure today at her office is 133/90 mmHg.
 Fetal growth is appropriate for gestational age.  Amniotic fluid
 is normal good fetal activity seen.  Antenatal testing is
 reassuring.  BPP [DATE].
 Minimal pericardial effusion is seen (4 mm).  I reassured the
 patient that pericardial effusion is not associated with adverse
 fetal outcomes.
----------------------------------------------------------------------
Recommendations

 -Continue weekly BPP till delivery.
----------------------------------------------------------------------
                      [place], [place]
----------------------------------------------------------------------

*** End of Addendum ***\.br----------------------------------------------------------------------
----------------------------------------------------------------------

----------------------------------------------------------------------

                                                            [REDACTED]
                   JVANVLIET CNM
----------------------------------------------------------------------

                                                      BOSTAD
----------------------------------------------------------------------

----------------------------------------------------------------------
Indications

 Hypertension - Chronic/Pre-
 existing(nifedipine and labetalol)
 Advanced maternal age multigravida 35+,
 second trimester(39 yrs)
 Obesity complicating pregnancy, second
 trimester
 Poor obstetric history: Previous
 preeclampsia / eclampsia/gestational HTN
 Poor obstetrical history (Postpartum
 Hemorrhage)
 Low Risk NIPS(Negative AFP)
 34 weeks gestation of pregnancy
----------------------------------------------------------------------
Fetal Evaluation

 Num Of Fetuses:         1
 Fetal Heart Rate(bpm):  142
 Cardiac Activity:       Observed
 Presentation:           Cephalic
 Placenta:               Posterior Fundal
 P. Cord Insertion:      Previously Visualized
 Amniotic Fluid
 AFI FV:      Within normal limits

 AFI Sum(cm)     %Tile       Largest Pocket(cm)
 12.8            41

 RUQ(cm)       RLQ(cm)       LUQ(cm)        LLQ(cm)

----------------------------------------------------------------------
Biophysical Evaluation

 Amniotic F.V:   Within normal limits       F. Tone:        Observed
 F. Movement:    Observed                   Score:          [DATE]
 F. Breathing:   Observed
----------------------------------------------------------------------
Biometry

 BPD:      85.6  mm     G. Age:  34w 4d         41  %    CI:        74.37   %    70 - 86
                                                         FL/HC:      21.4   %    20.1 -
 HC:      315.1  mm     G. Age:  35w 3d         28  %    HC/AC:      1.03        0.93 -
 AC:      305.3  mm     G. Age:  34w 3d         45  %    FL/BPD:     78.9   %    71 - 87
 FL:       67.5  mm     G. Age:  34w 5d         38  %    FL/AC:      22.1   %    20 - 24

 Est. FW:    4058  gm      5 lb 7 oz     40  %
----------------------------------------------------------------------
OB History

 Gravidity:    9         Term:   7        Prem:   0        SAB:   1
 TOP:          0       Ectopic:  0        Living: 7
----------------------------------------------------------------------
Gestational Age

 LMP:           35w 6d        Date:  10/23/20                 EDD:   07/30/21
 U/S Today:     34w 6d                                        EDD:   08/06/21
 Best:          34w 6d     Det. By:  U/S  (02/11/21)          EDD:   08/06/21
----------------------------------------------------------------------
Anatomy

 Cranium:               Appears normal         Aortic Arch:            Previously seen
 Cavum:                 Appears normal         Ductal Arch:            Previously seen
 Ventricles:            Appears normal         Diaphragm:              Appears normal
 Choroid Plexus:        Previously seen        Stomach:                Appears normal, left
                                                                       sided
 Cerebellum:            Appears normal         Abdomen:                Appears normal
 Posterior Fossa:       Appears normal         Abdominal Wall:         Previously seen
 Nuchal Fold:           Previously seen        Cord Vessels:           Appears normal (3
                                                                       vessel cord)
 Face:                  Orbits and profile     Kidneys:                Appear normal
                        previously seen
 Lips:                  Previously seen        Bladder:                Appears normal
 Thoracic:              Appears normal         Spine:                  Previously seen
 Heart:                 Appears normal         Upper Extremities:      Previously seen
                        (4CH, axis, and
                        situs)
 RVOT:                  Previously seen        Lower Extremities:      Previously seen
 LVOT:                  Previously seen

 Other:  Fetus previously appears to be female. Nasal bone, Heels and 5th
         digit previously visualized.
----------------------------------------------------------------------
Cervix Uterus Adnexa

 Cervix
 Normal appearance by transabdominal scan.
----------------------------------------------------------------------
Comments

 This patient was seen for a follow up growth scan and
 biophysical profile due to advanced maternal age and chronic
 hypertension currently treated with labetalol and nifedipine.
 The fetal growth and amniotic fluid level appears appropriate
 for her gestational age.
 A biophysical profile performed today was [DATE].
 She will return in 1 week for another biophysical profile.
----------------------------------------------------------------------
----------------------------------------------------------------------

## 2022-08-11 ENCOUNTER — Ambulatory Visit: Payer: 59

## 2022-12-01 ENCOUNTER — Ambulatory Visit (INDEPENDENT_AMBULATORY_CARE_PROVIDER_SITE_OTHER): Payer: 59 | Admitting: General Practice

## 2022-12-01 ENCOUNTER — Other Ambulatory Visit: Payer: Self-pay | Admitting: Emergency Medicine

## 2022-12-01 ENCOUNTER — Inpatient Hospital Stay (HOSPITAL_COMMUNITY)
Admission: AD | Admit: 2022-12-01 | Discharge: 2022-12-01 | Disposition: A | Discharge status: Home / Self Care | Payer: 59 | Attending: Obstetrics & Gynecology | Admitting: Obstetrics & Gynecology

## 2022-12-01 ENCOUNTER — Other Ambulatory Visit: Payer: Self-pay

## 2022-12-01 ENCOUNTER — Encounter (HOSPITAL_COMMUNITY): Payer: Self-pay | Admitting: Obstetrics & Gynecology

## 2022-12-01 VITALS — BP 158/102 | HR 87 | Ht 66.5 in | Wt 211.1 lb

## 2022-12-01 DIAGNOSIS — Z3A01 Less than 8 weeks gestation of pregnancy: Secondary | ICD-10-CM

## 2022-12-01 DIAGNOSIS — O10911 Unspecified pre-existing hypertension complicating pregnancy, first trimester: Secondary | ICD-10-CM | POA: Insufficient documentation

## 2022-12-01 DIAGNOSIS — R109 Unspecified abdominal pain: Secondary | ICD-10-CM | POA: Insufficient documentation

## 2022-12-01 DIAGNOSIS — K219 Gastro-esophageal reflux disease without esophagitis: Secondary | ICD-10-CM

## 2022-12-01 DIAGNOSIS — O209 Hemorrhage in early pregnancy, unspecified: Secondary | ICD-10-CM | POA: Diagnosis not present

## 2022-12-01 DIAGNOSIS — O26891 Other specified pregnancy related conditions, first trimester: Secondary | ICD-10-CM | POA: Diagnosis present

## 2022-12-01 DIAGNOSIS — I10 Essential (primary) hypertension: Secondary | ICD-10-CM

## 2022-12-01 DIAGNOSIS — O4690 Antepartum hemorrhage, unspecified, unspecified trimester: Secondary | ICD-10-CM

## 2022-12-01 DIAGNOSIS — Z3200 Encounter for pregnancy test, result unknown: Secondary | ICD-10-CM

## 2022-12-01 DIAGNOSIS — Z3201 Encounter for pregnancy test, result positive: Secondary | ICD-10-CM | POA: Diagnosis not present

## 2022-12-01 LAB — URINALYSIS, ROUTINE W REFLEX MICROSCOPIC
Bilirubin Urine: NEGATIVE
Glucose, UA: NEGATIVE mg/dL
Hgb urine dipstick: NEGATIVE
Ketones, ur: NEGATIVE mg/dL
Leukocytes,Ua: NEGATIVE
Nitrite: NEGATIVE
Protein, ur: NEGATIVE mg/dL
Specific Gravity, Urine: 1.01 (ref 1.005–1.030)
pH: 6 (ref 5.0–8.0)

## 2022-12-01 LAB — WET PREP, GENITAL
Clue Cells Wet Prep HPF POC: NONE SEEN
Sperm: NONE SEEN
Trich, Wet Prep: NONE SEEN
WBC, Wet Prep HPF POC: <10 (ref <10)
Yeast Wet Prep HPF POC: NONE SEEN

## 2022-12-01 LAB — POCT URINE PREGNANCY: Preg Test, Ur: POSITIVE — AB

## 2022-12-01 MED ORDER — LABETALOL HCL 200 MG PO TABS: 200.0000 mg | ORAL_TABLET | Freq: Two times a day (BID) | ORAL | 5 refills | Status: DC

## 2022-12-01 MED ORDER — PNV PRENATAL PLUS MULTIVITAMIN 27-1 MG PO TABS: 1.0000 | ORAL_TABLET | Freq: Every day | ORAL | 11 refills | Status: DC

## 2022-12-01 MED ORDER — HYDROCHLOROTHIAZIDE 25 MG PO TABS: 25.0000 mg | ORAL_TABLET | Freq: Every day | ORAL | 11 refills | Status: DC

## 2022-12-01 MED ORDER — OMEPRAZOLE 40 MG PO CPDR: 40.0000 mg | DELAYED_RELEASE_CAPSULE | Freq: Two times a day (BID) | ORAL | 5 refills | Status: DC

## 2022-12-01 NOTE — Progress Notes (Signed)
Paula Dominguez presents today for UPT. She complains of abdominal pain in the lower midline and feels like menstrual cramps, backache, and bleeding for several weeks.  LMP: Unsure, approximately 10-24-22    OBJECTIVE: Appears well, in no apparent distress.  OB History     Gravida  9   Para  8   Term  7   Preterm  1   AB  1   Living  8      SAB  1   IAB      Ectopic      Multiple  0   Live Births  8          Home UPT Result: Positive In-Office UPT result: Positive I have reviewed the patient's medical, obstetrical, social, and family histories, and medications.   ASSESSMENT: Positive pregnancy test  PLAN Prenatal care to be completed at: [facility] Pt sent to [facility] for abdominal pain, back pain and vaginal bleeding in pregnancy and hypertension per Catalina Antigua, MD. Pt will cal to schedule future appointments.

## 2022-12-01 NOTE — MAU Provider Note (Signed)
History     [id number]  Arrival date and time: 12/01/22 4098    Chief Complaint  Patient presents with   Abdominal Pain   Vaginal Bleeding     HPI Paula Dominguez is a 41 y.o. at [redacted]w[redacted]d by LMP with PMHx notable for recent death of 40 month old from SIDS, cHTN on medications, grand multiparity, hx of pre-E, depression/anxiety, who presents for newly diagnosed pregnancy with cramping and spotting.   Patient was seen at [facility] earlier today and had positive UPT She reported cramping and vaginal spotting and was directed to MAU No vaginal discharge or other symptoms Period was regular prior to conception      OB History     Gravida  10   Para  8   Term  7   Preterm  1   AB  1   Living  8      SAB  1   IAB      Ectopic      Multiple  0   Live Births  8           Past Medical History:  Diagnosis Date   Anemia    Anxiety and depression    Blood transfusion without reported diagnosis    2014, 2018   GERD (gastroesophageal reflux disease)    Hx of preeclampsia, prior pregnancy, currently pregnant    prior pregnancy   Hypertension     Past Surgical History:  Procedure Laterality Date   HEMORRHOID SURGERY      Family History  Problem Relation Age of Onset   Healthy Mother    Healthy Father    Hypertension Other    Cancer Other     Social History   Socioeconomic History   Marital status: Married    Spouse name: Bernette Seeman   Number of children: 5   Years of education: Not on file   Highest education level: Not on file  Occupational History   Occupation: CERTIFIED NURSING ASSISTANT    Employer: [employer]  Tobacco Use   Smoking status: Some Days    Packs/day: 0.25    Years: 17.00    Total pack years: 4.25    Types: Cigarettes    Last attempt to quit: 07/27/2013    Years since quitting: 9.3   Smokeless tobacco: Never   Tobacco comments:    3 cigs a day  Vaping Use   Vaping Use: Former   Substances:  Nicotine, Flavoring  Substance and Sexual Activity   Alcohol use: No   Drug use: No   Sexual activity: Yes    Partners: Male    Birth control/protection: Injection  Other Topics Concern   Not on file  Social History Narrative   Not on file   Social Determinants of Health   Financial Resource Strain: Not on file  Food Insecurity: Not on file  Transportation Needs: Not on file  Physical Activity: Not on file  Stress: Not on file  Social Connections: Not on file  Intimate Partner Violence: Not on file    No Known Allergies  No current facility-administered medications on file prior to encounter.   Current Outpatient Medications on File Prior to Encounter  Medication Sig Dispense Refill   ferrous sulfate 325 (65 FE) MG tablet Take 1 tablet (325 mg total) by mouth every other day. 15 tablet 3   ibuprofen (ADVIL) 600 MG tablet Take 1 tablet (600 mg total) by mouth every 6 (six) hours  as needed for moderate pain or mild pain. 30 tablet 0   lisinopril (ZESTRIL) 10 MG tablet Take 1 tablet (10 mg total) by mouth daily. 30 tablet 1   nicotine (NICODERM CQ - DOSED IN MG/24 HOURS) 21 mg/24hr patch Place 1 patch (21 mg total) onto the skin daily. 28 patch 0   NIFEdipine (ADALAT CC) 60 MG 24 hr tablet Take 1 tablet (60 mg total) by mouth 2 (two) times daily. 60 tablet 3   triamterene-hydrochlorothiazide (MAXZIDE-25) 37.5-25 MG tablet Take 1 tablet by mouth daily. 30 tablet 0     ROS Pertinent positives and negative per HPI, all others reviewed and negative  Physical Exam   BP (!) 145/96 (BP Location: Right Arm)   Pulse 79   Temp 98.2 F (36.8 C)   Resp 20   Ht 5\' 6"  (1.676 m)   Wt 95.8 kg   LMP 10/24/2022 (Approximate)   SpO2 100%   BMI 34.10 kg/m   Patient Vitals for the past 24 hrs:  BP Temp Pulse Resp SpO2 Height Weight  12/01/22 1017 (!) 145/96 98.2 F (36.8 C) 79 20 100 % 5\' 6"  (1.676 m) 95.8 kg    Physical Exam Vitals reviewed.  Constitutional:      General: She  is not in acute distress.    Appearance: She is well-developed. She is not diaphoretic.  Eyes:     General: No scleral icterus. Pulmonary:     Effort: Pulmonary effort is normal. No respiratory distress.  Abdominal:     General: There is no distension.     Palpations: Abdomen is soft.     Tenderness: There is no abdominal tenderness. There is no guarding or rebound.  Skin:    General: Skin is warm and dry.  Neurological:     Mental Status: She is alert.     Coordination: Coordination normal.      Cervical Exam    Bedside Ultrasound Pt informed that the ultrasound is considered a limited OB ultrasound and is not intended to be a complete ultrasound exam.  Patient also informed that the ultrasound is not being completed with the intent of assessing for fetal or placental anomalies or any pelvic abnormalities.  Explained that the purpose of today's ultrasound is to assess for  viability.  Patient acknowledges the purpose of the exam and the limitations of the study.    My interpretation: IUP visualized with yolk and fetal pole present but no cardiac activity visualized, though resolution admittedly low quality    Labs Results for orders placed or performed in visit on 12/01/22 (from the past 24 hour(s))  POCT urine pregnancy     Status: Abnormal   Collection Time: 12/01/22  9:46 AM  Result Value Ref Range   Preg Test, Ur Positive (A) Negative    Imaging No results found.  MAU Course  Procedures Lab Orders         Wet prep, genital         Urinalysis, Routine w reflex microscopic Urine, Clean Catch    Meds ordered this encounter  Medications   hydrochlorothiazide (HYDRODIURIL) 25 MG tablet    Sig: Take 1 tablet (25 mg total) by mouth daily.    Dispense:  30 tablet    Refill:  11   omeprazole (PRILOSEC) 40 MG capsule    Sig: Take 1 capsule (40 mg total) by mouth in the morning and at bedtime.    Dispense:  60 capsule    Refill:  5   Prenatal Vit-Fe Fumarate-FA (PNV  PRENATAL PLUS MULTIVITAMIN) 27-1 MG TABS    Sig: Take 1 capsule by mouth daily.    Dispense:  30 tablet    Refill:  11   labetalol (NORMODYNE) 200 MG tablet    Sig: Take 1 tablet (200 mg total) by mouth 2 (two) times daily.    Dispense:  60 tablet    Refill:  5   Imaging Orders  No imaging studies ordered today    MDM moderate  Assessment and Plan  #Abdominal pain in pregnancy, first trimester #Vaginal bleeding in pregnancy, first trimester #[redacted] weeks gestation of pregnancy Ruled out for ectopic with bedside US showing IUP. No cardiac activity visualized but fetal pole quite small and resolution not great on our [place] so may be present or just too early. Recommended that patient have follow up US in two weeks. Discussed cramping is often present early in pregnancy. Rh positive, rhogam not indicated. Start prenatal vitamins. Message sent to [facility] to schedule OB intake.   #Chronic hypertension On nifed 60 XL BID, HCTZ-triamterene 37.5-25 mg, and lisinopril 10. Instructed to stop lisinopril and combination HCTZ-triamterene. Instructed to start labetalol 200 BID and HCTZ 25 mg. Also given referral for cardio OB clinic given difficult to control HTN.    Dispo: discharged to home in stable condition. Discharge Instructions     AMB Referral to Cardio Obstetrics   Complete by: As directed    Reason for referral: Pregnancy Cardiovascular care       Venora Maples, MD/MPH 12/01/22 10:49 AM  Allergies as of 12/01/2022   No Known Allergies      Medication List     STOP taking these medications    ibuprofen 600 MG tablet Commonly known as: ADVIL   lisinopril 10 MG tablet Commonly known as: Zestril   triamterene-hydrochlorothiazide 37.5-25 MG tablet Commonly known as: MAXZIDE-25       TAKE these medications    ferrous sulfate 325 (65 FE) MG tablet Take 1 tablet (325 mg total) by mouth every other day.   hydrochlorothiazide 25 MG tablet Commonly known as:  HYDRODIURIL Take 1 tablet (25 mg total) by mouth daily.   labetalol 200 MG tablet Commonly known as: NORMODYNE Take 1 tablet (200 mg total) by mouth 2 (two) times daily.   nicotine 21 mg/24hr patch Commonly known as: NICODERM CQ - dosed in mg/24 hours Place 1 patch (21 mg total) onto the skin daily.   NIFEdipine 60 MG 24 hr tablet Commonly known as: ADALAT CC Take 1 tablet (60 mg total) by mouth 2 (two) times daily.   omeprazole 40 MG capsule Commonly known as: PRILOSEC Take 1 capsule (40 mg total) by mouth in the morning and at bedtime. What changed: when to take this   PNV Prenatal Plus Multivitamin 27-1 MG Tabs Take 1 capsule by mouth daily.

## 2022-12-01 NOTE — Progress Notes (Signed)
Dr. Crissie Reese @ bedside to perform U/S.  IUP noted.

## 2022-12-01 NOTE — Discharge Instructions (Signed)
You were seen in MAU. We ruled at an ectopic. You may continue to cramp and bleed. Come back to the MAU if you have heavy bleeding or severe abdominal pain.  I have also referred you to the Cardio-OB clinic at the [facility] so that we can get you connected with a cardiologist to help get your blood pressure under control.   I have also sent a message to the [facility] clinic to get you into prenatal care.

## 2022-12-01 NOTE — MAU Note (Signed)
Paula Dominguez is a 41 y.o. at [redacted]w[redacted]d here in MAU reporting: she' having lower abdominal cramping and back pain as well as spotting for past few weeks. LMP: 10/24/22 approximation Onset of complaint: few weeks  Pain score: 6 Vitals:   12/01/22 1017  BP: (!) 145/96  Pulse: 79  Resp: 20  Temp: 98.2 F (36.8 C)  SpO2: 100%     FHT:NA Lab orders placed from triage:   UC

## 2022-12-02 LAB — GC/CHLAMYDIA PROBE AMP (~~LOC~~) NOT AT ARMC
Chlamydia: NEGATIVE
Comment: NEGATIVE
Comment: NORMAL
Neisseria Gonorrhea: NEGATIVE

## 2022-12-15 ENCOUNTER — Ambulatory Visit
Admission: RE | Admit: 2022-12-15 | Discharge: 2022-12-15 | Disposition: A | Discharge status: Home / Self Care | Payer: 59 | Source: Ambulatory Visit | Attending: Obstetrics and Gynecology | Admitting: Obstetrics and Gynecology

## 2022-12-15 ENCOUNTER — Ambulatory Visit (INDEPENDENT_AMBULATORY_CARE_PROVIDER_SITE_OTHER): Payer: 59 | Admitting: Obstetrics and Gynecology

## 2022-12-15 ENCOUNTER — Ambulatory Visit: Payer: 59 | Admitting: Cardiology

## 2022-12-15 VITALS — BP 128/99 | HR 78 | Wt 208.0 lb

## 2022-12-15 DIAGNOSIS — Z3A00 Weeks of gestation of pregnancy not specified: Secondary | ICD-10-CM

## 2022-12-15 DIAGNOSIS — O4690 Antepartum hemorrhage, unspecified, unspecified trimester: Secondary | ICD-10-CM

## 2022-12-15 NOTE — Progress Notes (Signed)
GYNECOLOGY VISIT  Patient name: Paula Dominguez MRN [id number]  Date of birth: 12/03/1981 Chief Complaint:   Results   History:  Paula Dominguez is a 41 y.o. [id number] being seen today for review of [place] results today. Previously seen in MAU for cramping and bleeding. Occasional cramping at this time without bleeding. Currently sad about potential pregnancy loss and recently loss child to SIDs.    Past Medical History:  Diagnosis Date   Anemia    Anxiety and depression    Blood transfusion without reported diagnosis    2014, 2018   GERD (gastroesophageal reflux disease)    Hx of preeclampsia, prior pregnancy, currently pregnant    prior pregnancy   Hypertension     Past Surgical History:  Procedure Laterality Date   HEMORRHOID SURGERY      The following portions of the patient's history were reviewed and updated as appropriate: allergies, current medications, past family history, past medical history, past social history, past surgical history and problem list.   Review of Systems:  Pertinent items are noted in HPI. Comprehensive review of systems was otherwise negative.   Objective:  Physical Exam BP (!) 128/99   Pulse 78   Wt 208 lb (94.3 kg)   LMP 10/24/2022 (Approximate)   Breastfeeding No   BMI 33.57 kg/m    Physical Exam Vitals and nursing note reviewed.  Constitutional:      Appearance: Normal appearance.  HENT:     Head: Normocephalic and atraumatic.  Pulmonary:     Effort: Pulmonary effort is normal.  Neurological:     General: No focal deficit present.     Mental Status: She is alert.  Psychiatric:        Mood and Affect: Mood normal.        Behavior: Behavior normal.        Thought Content: Thought content normal.        Judgment: Judgment normal.     Labs and Imaging US OB LESS THAN 14 WEEKS WITH OB TRANSVAGINAL  Result Date: 12/15/2022 CLINICAL DATA:  First trimester pregnancy, assessment of viability and location, LMP  10/24/2022 EXAM: OBSTETRIC <14 WK [place] AND TRANSVAGINAL OB US TECHNIQUE: Both transabdominal and transvaginal ultrasound examinations were performed for complete evaluation of the gestation as well as the maternal uterus, adnexal regions, and pelvic cul-de-sac. Transvaginal technique was performed to assess early pregnancy. COMPARISON:  None available FINDINGS: Intrauterine gestational sac: Present, single Yolk sac:  Present, enlarged, 16 mm diameter Embryo:  Present Cardiac Activity: Absent Heart Rate: N/A  bpm CRL:  5.4 mm   6 w   2 d                  [place] EDC: 08/08/2023 Subchorionic hemorrhage:  None visualized. Maternal uterus/adnexae: Maternal uterus anteverted, otherwise unremarkable. Corpus luteum within LEFT ovary, ovaries otherwise normal appearance. No free pelvic fluid or adnexal masses. IMPRESSION: Single intrauterine gestation with crown-rump length corresponding to 6 weeks 2 days EGA. No fetal cardiac activity identified. Abnormal enlarged yolk sac. Findings are suspicious but not yet definitive for failed pregnancy. Recommend follow-up US in 10-14 days for definitive diagnosis. This recommendation follows SRU consensus guidelines: Diagnostic Criteria for Nonviable Pregnancy Early in the First Trimester. Malva Limes Med 2013; 631:4970-26. Electronically Signed   By: Ulyses Southward M.D.   On: 12/15/2022 10:14       Assessment & Plan:   1. Vaginal bleeding during pregnancy Reviewed non-diagnostic scan today  but highly suspicious for impending SAB. Recommend repeat scan in 10-14 days for viability. Reviewed SAB precautions.  - US OB LESS THAN 14 WEEKS WITH OB TRANSVAGINAL; Future   Routine preventative health maintenance measures emphasized.  Darliss Cheney, MD [facility]

## 2022-12-16 ENCOUNTER — Ambulatory Visit: Payer: 59 | Admitting: Cardiology

## 2022-12-21 ENCOUNTER — Telehealth: Payer: Self-pay | Admitting: Emergency Medicine

## 2022-12-21 NOTE — Telephone Encounter (Signed)
Attempted RC to patient regarding potential miscarriage symptoms.  LVM to return call to office.

## 2022-12-29 ENCOUNTER — Other Ambulatory Visit (HOSPITAL_COMMUNITY): Payer: 59

## 2023-01-03 ENCOUNTER — Ambulatory Visit: Payer: 59

## 2023-01-03 ENCOUNTER — Ambulatory Visit (INDEPENDENT_AMBULATORY_CARE_PROVIDER_SITE_OTHER): Payer: 59 | Admitting: Advanced Practice Midwife

## 2023-01-03 ENCOUNTER — Other Ambulatory Visit: Payer: Self-pay

## 2023-01-03 DIAGNOSIS — Z3A10 10 weeks gestation of pregnancy: Secondary | ICD-10-CM

## 2023-01-03 DIAGNOSIS — O039 Complete or unspecified spontaneous abortion without complication: Secondary | ICD-10-CM | POA: Diagnosis not present

## 2023-01-03 DIAGNOSIS — O4690 Antepartum hemorrhage, unspecified, unspecified trimester: Secondary | ICD-10-CM

## 2023-01-03 DIAGNOSIS — O209 Hemorrhage in early pregnancy, unspecified: Secondary | ICD-10-CM

## 2023-01-03 NOTE — Progress Notes (Unsigned)
Ultrasounds Results Note  SUBJECTIVE HPI:  Ms. Paula Dominguez is a 42 y.o. [id number] at [redacted]w[redacted]d by {Dating:19197::"LMP","*** week US","Other ***"} who presents to [facility] for followup ultrasound results. The patient denies/reports abdominal pain or vaginal bleeding.  Upon review of the patient's records, patient was first seen in MAU on *** for ***.     Previous HCG's ***  Previous US ***     Repeat ultrasound was performed earlier today.   Past Medical History:  Diagnosis Date   Anemia    Anxiety and depression    Blood transfusion without reported diagnosis    2014, 2018   GERD (gastroesophageal reflux disease)    Hx of preeclampsia, prior pregnancy, currently pregnant    prior pregnancy   Hypertension    Past Surgical History:  Procedure Laterality Date   HEMORRHOID SURGERY     Social History   Socioeconomic History   Marital status: Married    Spouse name: Lene Mckay   Number of children: 5   Years of education: Not on file   Highest education level: Not on file  Occupational History   Occupation: CERTIFIED NURSING ASSISTANT    Employer: [employer]  Tobacco Use   Smoking status: Some Days    Packs/day: 0.25    Years: 17.00    Total pack years: 4.25    Types: Cigarettes    Last attempt to quit: 07/27/2013    Years since quitting: 9.4   Smokeless tobacco: Never   Tobacco comments:    3 cigs a day  Vaping Use   Vaping Use: Former   Substances: Nicotine, Flavoring  Substance and Sexual Activity   Alcohol use: No   Drug use: No   Sexual activity: Yes    Partners: Male    Birth control/protection: Injection  Other Topics Concern   Not on file  Social History Narrative   Not on file   Social Determinants of Health   Financial Resource Strain: Not on file  Food Insecurity: Not on file  Transportation Needs: Not on file  Physical Activity: Not on file  Stress: Not on file  Social Connections: Not on file   Intimate Partner Violence: Not on file   Current Outpatient Medications on File Prior to Visit  Medication Sig Dispense Refill   hydrochlorothiazide (HYDRODIURIL) 25 MG tablet Take 1 tablet (25 mg total) by mouth daily. 30 tablet 11   labetalol (NORMODYNE) 200 MG tablet Take 1 tablet (200 mg total) by mouth 2 (two) times daily. 60 tablet 5   nicotine (NICODERM CQ - DOSED IN MG/24 HOURS) 21 mg/24hr patch Place 1 patch (21 mg total) onto the skin daily. 28 patch 0   omeprazole (PRILOSEC) 40 MG capsule Take 1 capsule (40 mg total) by mouth in the morning and at bedtime. 60 capsule 5   Prenatal Vit-Fe Fumarate-FA (PNV PRENATAL PLUS MULTIVITAMIN) 27-1 MG TABS Take 1 capsule by mouth daily. 30 tablet 11   No current facility-administered medications on file prior to visit.   No Known Allergies  I have reviewed patient's Past Medical Hx, Surgical Hx, Family Hx, Social Hx, medications and allergies.   Review of Systems Review of Systems  Constitutional: Negative for fever and chills.  Gastrointestinal: Negative for abdominal pain.  Genitourinary: Negative for vaginal bleeding.  Musculoskeletal: Negative for back pain.  Neurological: Negative for dizziness and weakness.    Physical Exam  LMP 10/24/2022 (Approximate)   Patient's last menstrual period was 10/24/2022 (  approximate). GENERAL: Well-developed, well-nourished female in no acute distress.  HEENT: Normocephalic, atraumatic.   LUNGS: Effort normal ABDOMEN: Deferred HEART: Regular rate  SKIN: Warm, dry and without erythema PSYCH: Normal mood and affect NEURO: Alert and oriented x 4  LAB RESULTS No results found for this or any previous visit (from the past 24 hour(s)).  IMAGING US OB LESS THAN 14 WEEKS WITH OB TRANSVAGINAL  Result Date: 12/15/2022 CLINICAL DATA:  First trimester pregnancy, assessment of viability and location, LMP 10/24/2022 EXAM: OBSTETRIC <14 WK [place] AND TRANSVAGINAL OB US TECHNIQUE: Both transabdominal and  transvaginal ultrasound examinations were performed for complete evaluation of the gestation as well as the maternal uterus, adnexal regions, and pelvic cul-de-sac. Transvaginal technique was performed to assess early pregnancy. COMPARISON:  None available FINDINGS: Intrauterine gestational sac: Present, single Yolk sac:  Present, enlarged, 16 mm diameter Embryo:  Present Cardiac Activity: Absent Heart Rate: N/A  bpm CRL:  5.4 mm   6 w   2 d                  [place] EDC: 08/08/2023 Subchorionic hemorrhage:  None visualized. Maternal uterus/adnexae: Maternal uterus anteverted, otherwise unremarkable. Corpus luteum within LEFT ovary, ovaries otherwise normal appearance. No free pelvic fluid or adnexal masses. IMPRESSION: Single intrauterine gestation with crown-rump length corresponding to 6 weeks 2 days EGA. No fetal cardiac activity identified. Abnormal enlarged yolk sac. Findings are suspicious but not yet definitive for failed pregnancy. Recommend follow-up US in 10-14 days for definitive diagnosis. This recommendation follows SRU consensus guidelines: Diagnostic Criteria for Nonviable Pregnancy Early in the First Trimester. Alta Corning Med 2013; 938:1017-51. Electronically Signed   By: Lavonia Dana M.D.   On: 12/15/2022 10:14    ASSESSMENT 1. Complete miscarriage     PLAN {F/U:19197::"Start prenatal care with provider of your choice","Go to MAU"} List of Ob/Gyn providers given. Repeat US *** Repeat Quant Hcg *** Patient advised to start/continue taking prenatal vitamins Go to MAU as needed for heavy bleeding, abdominal pain or fever greater than 100.4.  [place], CNM 01/03/2023 10:40 AM

## 2023-01-18 ENCOUNTER — Encounter: Payer: 59 | Admitting: Obstetrics and Gynecology

## 2023-01-18 ENCOUNTER — Ambulatory Visit: Payer: 59

## 2023-01-23 ENCOUNTER — Ambulatory Visit: Payer: 59 | Admitting: Family Medicine

## 2023-09-15 ENCOUNTER — Encounter (HOSPITAL_COMMUNITY): Payer: Self-pay

## 2023-09-15 ENCOUNTER — Inpatient Hospital Stay (HOSPITAL_COMMUNITY): Payer: 59

## 2023-09-15 ENCOUNTER — Inpatient Hospital Stay (HOSPITAL_COMMUNITY)
Admission: AD | Admit: 2023-09-15 | Discharge: 2023-09-15 | Disposition: A | Discharge status: Home / Self Care | Payer: 59 | Attending: Obstetrics & Gynecology | Admitting: Obstetrics & Gynecology

## 2023-09-15 DIAGNOSIS — R42 Dizziness and giddiness: Secondary | ICD-10-CM | POA: Diagnosis not present

## 2023-09-15 DIAGNOSIS — Z3A01 Less than 8 weeks gestation of pregnancy: Secondary | ICD-10-CM

## 2023-09-15 DIAGNOSIS — F17210 Nicotine dependence, cigarettes, uncomplicated: Secondary | ICD-10-CM | POA: Insufficient documentation

## 2023-09-15 DIAGNOSIS — O26891 Other specified pregnancy related conditions, first trimester: Secondary | ICD-10-CM | POA: Diagnosis not present

## 2023-09-15 DIAGNOSIS — O99331 Smoking (tobacco) complicating pregnancy, first trimester: Secondary | ICD-10-CM | POA: Diagnosis not present

## 2023-09-15 DIAGNOSIS — O09291 Supervision of pregnancy with other poor reproductive or obstetric history, first trimester: Secondary | ICD-10-CM | POA: Diagnosis not present

## 2023-09-15 DIAGNOSIS — O10011 Pre-existing essential hypertension complicating pregnancy, first trimester: Secondary | ICD-10-CM | POA: Insufficient documentation

## 2023-09-15 DIAGNOSIS — Z3201 Encounter for pregnancy test, result positive: Secondary | ICD-10-CM | POA: Diagnosis present

## 2023-09-15 DIAGNOSIS — R030 Elevated blood-pressure reading, without diagnosis of hypertension: Secondary | ICD-10-CM | POA: Diagnosis present

## 2023-09-15 LAB — URINALYSIS, ROUTINE W REFLEX MICROSCOPIC
Bilirubin Urine: NEGATIVE
Glucose, UA: NEGATIVE mg/dL
Hgb urine dipstick: NEGATIVE
Ketones, ur: NEGATIVE mg/dL
Leukocytes,Ua: NEGATIVE
Nitrite: NEGATIVE
Protein, ur: NEGATIVE mg/dL
Specific Gravity, Urine: 1.02 (ref 1.005–1.030)
pH: 6 (ref 5.0–8.0)

## 2023-09-15 LAB — CBC
HCT: 36.1 % (ref 36.0–46.0)
Hemoglobin: 11.3 g/dL — ABNORMAL LOW (ref 12.0–15.0)
MCH: 27.7 pg (ref 26.0–34.0)
MCHC: 31.3 g/dL (ref 30.0–36.0)
MCV: 88.5 fL (ref 80.0–100.0)
Platelets: 267 10*3/uL (ref 150–400)
RBC: 4.08 MIL/uL (ref 3.87–5.11)
RDW: 15.9 % — ABNORMAL HIGH (ref 11.5–15.5)
WBC: 7.8 10*3/uL (ref 4.0–10.5)
nRBC: 0 % (ref 0.0–0.2)

## 2023-09-15 LAB — COMPREHENSIVE METABOLIC PANEL
ALT: 14 U/L (ref 0–44)
AST: 17 U/L (ref 15–41)
Albumin: 3.6 g/dL (ref 3.5–5.0)
Alkaline Phosphatase: 46 U/L (ref 38–126)
Anion gap: 10 (ref 5–15)
BUN: 7 mg/dL (ref 6–20)
CO2: 27 mmol/L (ref 22–32)
Calcium: 9.6 mg/dL (ref 8.9–10.3)
Chloride: 101 mmol/L (ref 98–111)
Creatinine, Ser: 0.68 mg/dL (ref 0.44–1.00)
GFR, Estimated: >60 mL/min (ref >60)
Glucose, Bld: 101 mg/dL — ABNORMAL HIGH (ref 70–99)
Potassium: 3.4 mmol/L — ABNORMAL LOW (ref 3.5–5.1)
Sodium: 138 mmol/L (ref 135–145)
Total Bilirubin: 0.6 mg/dL (ref 0.3–1.2)
Total Protein: 7.4 g/dL (ref 6.5–8.1)

## 2023-09-15 LAB — HCG, QUANTITATIVE, PREGNANCY: hCG, Beta Chain, Quant, S: 14843 m[IU]/mL — ABNORMAL HIGH (ref <5)

## 2023-09-15 LAB — POCT PREGNANCY, URINE: Preg Test, Ur: POSITIVE — AB

## 2023-09-15 MED ORDER — PROGESTERONE 200 MG PO CAPS: ORAL_CAPSULE | ORAL | 3 refills | Status: DC

## 2023-09-15 MED ORDER — PNV PRENATAL PLUS MULTIVITAMIN 27-1 MG PO TABS: 1.0000 | ORAL_TABLET | Freq: Every day | ORAL | 3 refills | Status: DC

## 2023-09-15 NOTE — MAU Note (Signed)
.  Paula Dominguez is a 42 y.o. at Unknown here in MAU reporting: after lunch  felt dizzy. Went back to her desk and was typing numbers for letters.  Felt hot and dizzy and vision was fuzzy. Went to nurse at work and she took her b/p and it was very high  220/110 and then they took a pregnancy test was positive.  Paramedics where called and she opted to have her husband bring her. Pt did  take her b/p medication.Pt did not release she had missed a period until she really though about it. Had a miscarriage earlier in the year. Reports she has some cramping in her back  LMP: 07/16/2023 Onset of complaint: today Pain score: 7 Vitals:   09/15/23 1647  BP: 126/82  Pulse: 87  Resp: 18  Temp: 99.1 F (37.3 C)     FHT:n/a Lab orders placed from triage:upt,u/a

## 2023-09-15 NOTE — MAU Provider Note (Signed)
  History     CSN: [id number]  Arrival date and time: 09/15/23 1534   Event Date/Time   First Provider Initiated Contact with Patient 09/15/23 2022      Chief Complaint  Patient presents with   Dizziness   HPI Paula Dominguez is a 42 y.o. G***P*** at [redacted]w[redacted]d who receives care at ***.  She presents today for dizziness.   {GYN/OB VH:8469629}  Past Medical History:  Diagnosis Date   Anemia    Anxiety and depression    Blood transfusion without reported diagnosis    2014, 2018   GERD (gastroesophageal reflux disease)    Hx of preeclampsia, prior pregnancy, currently pregnant    prior pregnancy   Hypertension     Past Surgical History:  Procedure Laterality Date   HEMORRHOID SURGERY      Family History  Problem Relation Age of Onset   Healthy Mother    Healthy Father    Hypertension Other    Cancer Other     Social History   Tobacco Use   Smoking status: Some Days    Current packs/day: 0.00    Average packs/day: 0.3 packs/day for 17.0 years (4.3 ttl pk-yrs)    Types: Cigarettes    Start date: 07/27/1996    Last attempt to quit: 07/27/2013    Years since quitting: 10.1   Smokeless tobacco: Never   Tobacco comments:    3 cigs a day  Vaping Use   Vaping status: Former   Substances: Nicotine, Flavoring  Substance Use Topics   Alcohol use: No   Drug use: No    Allergies: No Known Allergies  Medications Prior to Admission  Medication Sig Dispense Refill Last Dose   hydrochlorothiazide (HYDRODIURIL) 25 MG tablet Take 1 tablet (25 mg total) by mouth daily. 30 tablet 11    labetalol (NORMODYNE) 200 MG tablet Take 1 tablet (200 mg total) by mouth 2 (two) times daily. 60 tablet 5    nicotine (NICODERM CQ - DOSED IN MG/24 HOURS) 21 mg/24hr patch Place 1 patch (21 mg total) onto the skin daily. 28 patch 0    omeprazole (PRILOSEC) 40 MG capsule Take 1 capsule (40 mg total) by mouth in the morning and at bedtime. 60 capsule 5    Prenatal Vit-Fe Fumarate-FA (PNV  PRENATAL PLUS MULTIVITAMIN) 27-1 MG TABS Take 1 capsule by mouth daily. 30 tablet 11     Review of Systems Physical Exam   Blood pressure 126/82, pulse 87, temperature 99.1 F (37.3 C), resp. rate 18, height 5\' 6"  (1.676 m), weight 90.9 kg, last menstrual period 07/16/2023, unknown if currently breastfeeding.  Physical Exam  MAU Course  Procedures  MDM ***  Assessment and Plan  ***  Cherre Robins 09/15/2023, 8:22 PM   Reassessment (10:35 PM)

## 2023-10-04 ENCOUNTER — Telehealth: Payer: Self-pay | Admitting: Family Medicine

## 2023-10-04 NOTE — Telephone Encounter (Signed)
Patient want to know if she is able to be apart of [facility], she also would like to get a better understanding before scheduling

## 2023-10-04 NOTE — Telephone Encounter (Signed)
Not eligible for [facility] due to 9th baby.  Front office made aware and will call patient to schedule OB visits in our office.  Judeth Cornfield, RNC

## 2023-10-06 ENCOUNTER — Ambulatory Visit: Payer: 59 | Admitting: *Deleted

## 2023-10-06 DIAGNOSIS — Z3481 Encounter for supervision of other normal pregnancy, first trimester: Secondary | ICD-10-CM

## 2023-10-06 DIAGNOSIS — O0990 Supervision of high risk pregnancy, unspecified, unspecified trimester: Secondary | ICD-10-CM | POA: Insufficient documentation

## 2023-10-06 DIAGNOSIS — Z3A09 9 weeks gestation of pregnancy: Secondary | ICD-10-CM | POA: Diagnosis not present

## 2023-10-06 DIAGNOSIS — O99340 Other mental disorders complicating pregnancy, unspecified trimester: Secondary | ICD-10-CM

## 2023-10-06 DIAGNOSIS — F419 Anxiety disorder, unspecified: Secondary | ICD-10-CM

## 2023-10-06 NOTE — Progress Notes (Deleted)
   Established Patient Office Visit  Subjective   Patient ID: Paula Dominguez, female    DOB: [birth year]  Age: 42 y.o. MRN: [id number]  Chief Complaint  Patient presents with   OB Intake    HPI  {History (Optional):23778}  ROS    Objective:     LMP 07/16/2023  {Vitals History (Optional):23777}  Physical Exam   No results found for any visits on 10/06/23.  {Labs (Optional):23779}  The 10-year ASCVD risk score (Arnett DK, et al., 2019) is: 3.9%    Assessment & Plan:   Problem List Items Addressed This Visit     Supervision of high risk pregnancy, antepartum   Relevant Orders   Enroll Patient in PreNatal Babyscripts   Babyscripts Schedule Optimization   [place] MFM OB DETAIL +14 WK   Other Visit Diagnoses     Depression affecting pregnancy    -  Primary   Relevant Orders   Ambulatory referral to Integrated Behavioral Health   Anxiety during pregnancy       Relevant Orders   Ambulatory referral to Integrated Behavioral Health       No follow-ups on file.    Harrel Lemon, RN

## 2023-10-06 NOTE — Progress Notes (Signed)
New OB Intake  I connected with Harriet Butte  on 10/06/23 at 10:15 AM EDT by In Person Visit and verified that I am speaking with the correct person using two identifiers. Nurse is located at [facility] and pt is located at Work.  I discussed the limitations, risks, security and privacy concerns of performing an evaluation and management service by telephone and the availability of in person appointments. I also discussed with the patient that there may be a patient responsible charge related to this service. The patient expressed understanding and agreed to proceed.  I explained I am completing New OB Intake today. We discussed EDD of 05/09/2024, by Ultrasound. Pt is [id number]. I reviewed her allergies, medications and Medical/Surgical/OB history.    Patient Active Problem List   Diagnosis Date Noted   Supervision of high risk pregnancy, antepartum 10/06/2023   Chronic hypertension 08/13/2021   Insomnia due to medical condition 01/08/2019   Grand multiparity 07/11/2018   Iron deficiency anemia 01/08/2018   Genital warts 01/12/2016   Migraine headache 01/12/2016   Mixed hyperlipidemia 01/12/2016   Moderate major depression (HCC) 01/12/2016   Bipolar disorder (HCC) 01/12/2016   History of postpartum depression 01/09/2014   Panic disorder with agoraphobia and moderate panic attacks 01/09/2014   History of pre-eclampsia 11/19/2013    Concerns addressed today  Delivery Plans Plans to deliver at [facility]. Discussed the nature of our practice with multiple providers including residents and students. Due to the size of the practice, the delivering provider may not be the same as those providing prenatal care.   Patient is not interested in water birth. Offered upcoming OB visit with CNM to discuss further.  MyChart/Babyscripts MyChart access verified. I explained pt will have some visits in office and some virtually. Babyscripts instructions given and order placed. Patient  verifies receipt of registration text/e-mail. Account successfully created and app downloaded.  Blood Pressure Cuff/Weight Scale Pt has BP cuff. Explained after first prenatal appt pt will check weekly and document in Babyscripts. Patient does not have weight scale; patient may purchase if they desire to track weight weekly in Babyscripts.  Anatomy US Explained first scheduled [place] will be around 19 weeks. Anatomy US scheduled for TBD at TBD   Is patient a Mom+Baby Combined Care candidate?  Not a candidate   If accepted, confirm patient does not intend to move from the area for at least 12 months, then notify Mom+Baby staff  Interested in [place]? If yes, send referral and doula dot phrase.   Is patient a candidate for Babyscripts Optimization? No - High Risk  First visit review I reviewed new OB appt with patient. Explained pt will be seen by Dr. Debroah Loop at first visit. Discussed Avelina Laine genetic screening with patient. Requests Panorama and Horizon.. Routine prenatal labs  not collected/ virtual visit.    Last Pap Diagnosis  Date Value Ref Range Status  02/19/2021 (A)  Final   - Atypical squamous cells of undetermined significance (ASC-US)    Harrel Lemon, RN 10/06/2023  12:43 PM

## 2023-10-09 ENCOUNTER — Other Ambulatory Visit: Payer: Self-pay | Admitting: Family Medicine

## 2023-10-09 DIAGNOSIS — K219 Gastro-esophageal reflux disease without esophagitis: Secondary | ICD-10-CM

## 2023-10-30 ENCOUNTER — Encounter (HOSPITAL_COMMUNITY): Payer: Self-pay | Admitting: Obstetrics & Gynecology

## 2023-10-30 ENCOUNTER — Other Ambulatory Visit: Payer: Self-pay | Admitting: *Deleted

## 2023-10-30 ENCOUNTER — Telehealth: Payer: Self-pay

## 2023-10-30 ENCOUNTER — Inpatient Hospital Stay (HOSPITAL_COMMUNITY)
Admission: AD | Admit: 2023-10-30 | Discharge: 2023-10-30 | Disposition: A | Discharge status: Home / Self Care | Payer: 59 | Attending: Family Medicine | Admitting: Family Medicine

## 2023-10-30 DIAGNOSIS — Z711 Person with feared health complaint in whom no diagnosis is made: Secondary | ICD-10-CM | POA: Insufficient documentation

## 2023-10-30 DIAGNOSIS — F319 Bipolar disorder, unspecified: Secondary | ICD-10-CM | POA: Insufficient documentation

## 2023-10-30 DIAGNOSIS — O99891 Other specified diseases and conditions complicating pregnancy: Secondary | ICD-10-CM

## 2023-10-30 DIAGNOSIS — O99341 Other mental disorders complicating pregnancy, first trimester: Secondary | ICD-10-CM | POA: Insufficient documentation

## 2023-10-30 DIAGNOSIS — D509 Iron deficiency anemia, unspecified: Secondary | ICD-10-CM | POA: Diagnosis not present

## 2023-10-30 DIAGNOSIS — F4001 Agoraphobia with panic disorder: Secondary | ICD-10-CM

## 2023-10-30 DIAGNOSIS — G5603 Carpal tunnel syndrome, bilateral upper limbs: Secondary | ICD-10-CM | POA: Diagnosis not present

## 2023-10-30 DIAGNOSIS — O09291 Supervision of pregnancy with other poor reproductive or obstetric history, first trimester: Secondary | ICD-10-CM | POA: Insufficient documentation

## 2023-10-30 DIAGNOSIS — R519 Headache, unspecified: Secondary | ICD-10-CM | POA: Diagnosis present

## 2023-10-30 DIAGNOSIS — R040 Epistaxis: Secondary | ICD-10-CM | POA: Diagnosis not present

## 2023-10-30 DIAGNOSIS — O99351 Diseases of the nervous system complicating pregnancy, first trimester: Secondary | ICD-10-CM | POA: Insufficient documentation

## 2023-10-30 DIAGNOSIS — O10011 Pre-existing essential hypertension complicating pregnancy, first trimester: Secondary | ICD-10-CM | POA: Insufficient documentation

## 2023-10-30 DIAGNOSIS — Z3A12 12 weeks gestation of pregnancy: Secondary | ICD-10-CM | POA: Diagnosis not present

## 2023-10-30 LAB — PROTEIN / CREATININE RATIO, URINE
Creatinine, Urine: 105 mg/dL
Protein Creatinine Ratio: 0.07 mg/mg{creat} (ref 0.00–0.15)
Total Protein, Urine: 7 mg/dL

## 2023-10-30 MED ORDER — CYCLOBENZAPRINE HCL 10 MG PO TABS: 10.0000 mg | ORAL_TABLET | Freq: Two times a day (BID) | ORAL | 2 refills | Status: DC | PRN

## 2023-10-30 MED ORDER — MISC. DEVICES MISC: 1.0000 | 0 refills | Status: AC | PRN

## 2023-10-30 MED ORDER — EXCEDRIN TENSION HEADACHE 500-65 MG PO TABS: 2.0000 | ORAL_TABLET | Freq: Four times a day (QID) | ORAL | 0 refills | Status: DC | PRN

## 2023-10-30 MED ORDER — WRIST BRACE MISC: 2.0000 | 0 refills | Status: DC | PRN

## 2023-10-30 MED ORDER — ACETAMINOPHEN-CAFFEINE 500-65 MG PO TABS
2.0000 | ORAL_TABLET | Freq: Once | ORAL | Status: AC
  Administered 2023-10-30: 2 via ORAL
  Filled 2023-10-30: qty 2

## 2023-10-30 NOTE — MAU Note (Signed)
.  Paula Dominguez is a 42 y.o. at [redacted]w[redacted]d here in MAU reporting: has chronic HTN on labetalol 200mg  BID. Has ben having headaches and feeling dizzy and b/p 158/96. Has been as high as 162/119. Stated she had had bloody noses off and on since the beginning of her pregnancy. Also reports having a headache today.  LMP:  Onset of complaint: this weekend Pain score: 7 Vitals:   10/30/23 1531  BP: 136/86  Pulse: 88  Resp: 18  Temp: 98.2 F (36.8 C)     FHT:167 Lab orders placed from triage:

## 2023-10-30 NOTE — Telephone Encounter (Signed)
Patient called complaining of a persistent headache since Thursday night that worsened yesterday. Took Tylenol with no relief. States that she ate a piece of toast this morning and took a labetalol and the headache eased up some but still rates pain at 5-6/10. Also reports having some blurry vision. BP at time of call 156/80. BP this morning recorded in baby rx as 158/98. Patient advised to go to MAU for eval.

## 2023-10-30 NOTE — MAU Provider Note (Signed)
History     [id number]  Arrival date and time: 10/30/23 1428    Chief Complaint  Patient presents with  . Hypertension     HPI Paula Dominguez is a 42 y.o. at [redacted]w[redacted]d by *** with PMHx notable for ***, who presents for ***.   Review of outside prenatal records from [facility] (in media tab): ***  Review of discharge summary from last admission on ***:   Review of records from Care Everywhere: ***  ***merge into HPI Vaginal bleeding: {yes/no:20286} LOF: {yes/no:20286} Fetal Movement: {yes/no:20286} Contractions: {yes/no:20286}     {GYN/OB WU:9811914}  Past Medical History:  Diagnosis Date  . Anemia   . Anxiety and depression   . Blood transfusion without reported diagnosis    2014, 2018  . GERD (gastroesophageal reflux disease)   . Hx of preeclampsia, prior pregnancy, currently pregnant    prior pregnancy  . Hypertension     Past Surgical History:  Procedure Laterality Date  . HEMORRHOID SURGERY      Family History  Problem Relation Age of Onset  . Healthy Mother   . Healthy Father   . Hypertension Other   . Cancer Other     Social History   Socioeconomic History  . Marital status: Married    Spouse name: Anvika Gashi  . Number of children: 5  . Years of education: Not on file  . Highest education level: Not on file  Occupational History  . Occupation: CERTIFIED NURSING ASSISTANT    Employer: [employer]  Tobacco Use  . Smoking status: Former    Current packs/day: 0.00    Average packs/day: 0.3 packs/day for 17.0 years (4.3 ttl pk-yrs)    Types: Cigarettes    Start date: 07/27/1996    Quit date: 07/27/2013    Years since quitting: 10.2  . Smokeless tobacco: Never  . Tobacco comments:    3 cigs a day  Vaping Use  . Vaping status: Former  . Substances: Nicotine, Flavoring  Substance and Sexual Activity  . Alcohol use: No  . Drug use: No  . Sexual activity: Yes    Partners: Male  Other Topics Concern  . Not on file   Social History Narrative  . Not on file   Social Determinants of Health   Financial Resource Strain: Not on file  Food Insecurity: Not on file  Transportation Needs: Not on file  Physical Activity: Not on file  Stress: Not on file  Social Connections: Not on file  Intimate Partner Violence: Not on file    No Known Allergies  No current facility-administered medications on file prior to encounter.   Current Outpatient Medications on File Prior to Encounter  Medication Sig Dispense Refill  . labetalol (NORMODYNE) 200 MG tablet Take 1 tablet (200 mg total) by mouth 2 (two) times daily. 60 tablet 5  . omeprazole (PRILOSEC) 40 MG capsule Take 1 capsule (40 mg total) by mouth in the morning and at bedtime. 60 capsule 5  . Prenatal Vit-Fe Fumarate-FA (PNV PRENATAL PLUS MULTIVITAMIN) 27-1 MG TABS Take 1 capsule by mouth daily. 90 tablet 3  . progesterone (PROMETRIUM) 200 MG capsule Place one capsule vaginally at bedtime 30 capsule 3     ROS Pertinent positives and negative per HPI, all others reviewed and negative  Physical Exam   BP (!) 157/94   Pulse 83   Temp 98.2 F (36.8 C)   Resp 18   Ht 5\' 6"  (1.676 m)   Wt  97.1 kg   LMP 07/16/2023   SpO2 100%   BMI 34.54 kg/m   Patient Vitals for the past 24 hrs:  BP Temp Pulse Resp SpO2 Height Weight  10/30/23 1646 (!) 157/94 -- 83 -- -- -- --  10/30/23 1634 (!) 145/91 -- 76 -- 100 % -- --  10/30/23 1620 -- -- -- -- 99 % -- --  10/30/23 1615 130/72 -- 86 -- 100 % -- --  10/30/23 1559 136/65 -- 81 -- 100 % -- --  10/30/23 1531 136/86 98.2 F (36.8 C) 88 18 -- 5\' 6"  (1.676 m) 97.1 kg    Physical Exam ***  Cervical Exam    Bedside Ultrasound {Bedside US:29431}  My interpretation: {MAU Bedside US Findings:29432}  FHT Baseline: *** bpm Variability: {:31519} Accelerations: {:31520} Decelerations: {FHR DECEL PRESENT:31526} Uterine activity: {Uterine contractions:31516} Cat: {Roman numerals I-III:29427}  Labs No  results found for this or any previous visit (from the past 24 hour(s)).  Imaging No results found.  MAU Course  Procedures  Lab Orders         CBC with Differential/Platelet         Comprehensive metabolic panel         Protein / creatinine ratio, urine    Meds ordered this encounter  Medications  . acetaminophen-caffeine (EXCEDRIN TENSION HEADACHE) 500-65 MG per tablet 2 tablet   Imaging Orders  No imaging studies ordered today    MDM {MDM:29430}  Assessment and Plan  #{MAU Common Diagnoses:30376} #*** weeks gestation of pregnancy ***  #FWB FHT Cat {Roman numerals I-III:29427} NST: {NST Interpretation:29428}   Dispo: {MAU disposition:29429}  ***add GA to billing    Venora Maples, MD/MPH 10/30/23 5:02 PM  Allergies as of 10/30/2023   No Known Allergies   Med Rec must be completed prior to using this [facility]***

## 2023-11-14 ENCOUNTER — Ambulatory Visit: Payer: 59 | Admitting: Obstetrics & Gynecology

## 2023-11-14 ENCOUNTER — Other Ambulatory Visit (HOSPITAL_COMMUNITY)
Admission: RE | Admit: 2023-11-14 | Discharge: 2023-11-14 | Disposition: A | Discharge status: Home / Self Care | Payer: 59 | Source: Ambulatory Visit | Attending: Obstetrics & Gynecology | Admitting: Obstetrics & Gynecology

## 2023-11-14 ENCOUNTER — Other Ambulatory Visit: Payer: Self-pay | Admitting: Family Medicine

## 2023-11-14 ENCOUNTER — Encounter: Payer: Self-pay | Admitting: Obstetrics & Gynecology

## 2023-11-14 VITALS — BP 137/97 | HR 87 | Wt 214.0 lb

## 2023-11-14 DIAGNOSIS — O0992 Supervision of high risk pregnancy, unspecified, second trimester: Secondary | ICD-10-CM | POA: Diagnosis not present

## 2023-11-14 DIAGNOSIS — Z3A14 14 weeks gestation of pregnancy: Secondary | ICD-10-CM

## 2023-11-14 DIAGNOSIS — F319 Bipolar disorder, unspecified: Secondary | ICD-10-CM | POA: Diagnosis not present

## 2023-11-14 DIAGNOSIS — I10 Essential (primary) hypertension: Secondary | ICD-10-CM

## 2023-11-14 DIAGNOSIS — Z8482 Family history of sudden infant death syndrome: Secondary | ICD-10-CM

## 2023-11-14 DIAGNOSIS — O0990 Supervision of high risk pregnancy, unspecified, unspecified trimester: Secondary | ICD-10-CM | POA: Insufficient documentation

## 2023-11-14 MED ORDER — WRIST BRACE MISC: 2.0000 | 0 refills | Status: AC | PRN

## 2023-11-14 NOTE — Progress Notes (Signed)
Subjective:    Paula Dominguez is a [id number] [redacted]w[redacted]d being seen today for her first obstetrical visit.  Her obstetrical history is significant for advanced maternal age and CHTN . Patient does intend to breast feed. Pregnancy history fully reviewed.  Patient reports  CTS sx, right leg numbness occasionally  .  Vitals:   11/14/23 1108  BP: (!) 137/97  Pulse: 87  Weight: 214 lb (97.1 kg)    HISTORY: OB History  Gravida Para Term Preterm AB Living  11 8 7 1 2 6   SAB IAB Ectopic Multiple Live Births  2     0 8    # Outcome Date GA Lbr Len/2nd Weight Sex Type Anes PTL Lv  11 Current           10 SAB 2023          9 Preterm 07/08/21 [redacted]w[redacted]d / 00:04 5 lb 4.8 oz (2.405 kg) F Vag-Spont EPI  DEC  8 Term 11/10/18 [redacted]w[redacted]d 08:49 / 00:03 5 lb 12.6 oz (2.625 kg) F Vag-Spont EPI  LIV  7 Term 11/20/13 [redacted]w[redacted]d 06:10 / 00:12 6 lb 7 oz (2.92 kg) M Vag-Spont EPI, Local  LIV  6 Term 12/23/10 [redacted]w[redacted]d  6 lb (2.722 kg) M Vag-Spont EPI  LIV     Birth Comments: preeclampsia  5 Term 03/29/06 [redacted]w[redacted]d  7 lb (3.175 kg) F Vag-Spont   LIV  4 SAB 2006 [redacted]w[redacted]d         3 Term 10/22/02 [redacted]w[redacted]d  7 lb (3.175 kg) M Vag-Spont   LIV  2 Term 08/10/98 [redacted]w[redacted]d  7 lb (3.175 kg) F Vag-Spont EPI  LIV  1 Term 03/23/95 [redacted]w[redacted]d  7 lb (3.175 kg) M Vag-Spont EPI  LIV   Past Medical History:  Diagnosis Date   Anemia    Anxiety and depression    Blood transfusion without reported diagnosis    2014, 2018   GERD (gastroesophageal reflux disease)    Hx of preeclampsia, prior pregnancy, currently pregnant    prior pregnancy   Hypertension    Past Surgical History:  Procedure Laterality Date   HEMORRHOID SURGERY     Family History  Problem Relation Age of Onset   Healthy Mother    Healthy Father    Hypertension Other    Cancer Other      Exam    Uterus:   15 week  Pelvic Exam:    Perineum: No Hemorrhoids   Vulva: normal   Vagina:  normal mucosa   pH:    Cervix: no lesions   Adnexa: normal adnexa   Bony Pelvis:  average  System: Breast:  Inspection negative   Skin: normal coloration and turgor, no rashes    Neurologic: oriented, normal mood   Extremities: normal strength, tone, and muscle mass   HEENT PERRLA, extra ocular movement intact, sclera clear, anicteric, and neck supple with midline trachea   Mouth/Teeth mucous membranes moist, pharynx normal without lesions   Neck supple and no masses   Cardiovascular: regular rate and rhythm   Respiratory:  appears well, vitals normal, no respiratory distress, acyanotic, normal RR   Abdomen: soft, non-tender; bowel sounds normal; no masses,  no organomegaly   Urinary: urethral meatus normal      Assessment:    Pregnancy: [id number] Patient Active Problem List   Diagnosis Date Noted   Supervision of high risk pregnancy, antepartum 10/06/2023   Chronic hypertension 08/13/2021   Insomnia due to medical condition 01/08/2019   [facility]  multiparity 07/11/2018   Iron deficiency anemia 01/08/2018   Genital warts 01/12/2016   Migraine headache 01/12/2016   Mixed hyperlipidemia 01/12/2016   Moderate major depression (HCC) 01/12/2016   Bipolar disorder (HCC) 01/12/2016   History of postpartum depression 01/09/2014   Panic disorder with agoraphobia and moderate panic attacks 01/09/2014   History of pre-eclampsia 11/19/2013        Plan:     Initial labs drawn. Prenatal vitamins. Problem list reviewed and updated. Genetic Screening discussed : ordered.  Ultrasound discussed; fetal survey: ordered.  Follow up in 4 weeks. 50% of 30 min visit spent on counseling and coordination of care.  PT referral for right leg sx BH referral due to bipolar and anxiety previously Rx benzodiazepine   Scheryl Darter 11/14/2023

## 2023-11-14 NOTE — Progress Notes (Signed)
Pt had some issues with numbness in right leg- more so near knee.  Pt had some issues after last delivery with same leg, pt is concerned about this. Pt also having increase in brain fog and HA's and nosebleeds.  Pt is having a hard time with her recent history and mental state. Pt feels she needs immediate anxiety relief- pt has seen Dr Crissie Reese in the past about this.  Pt needs a Rx for wrist splints. Pt has on profile but pharmacy says did not receive.

## 2023-11-15 LAB — CBC/D/PLT+RPR+RH+ABO+RUBIGG...
Antibody Screen: NEGATIVE
Basophils Absolute: 0 10*3/uL (ref 0.0–0.2)
Basos: 0 %
EOS (ABSOLUTE): 0.2 10*3/uL (ref 0.0–0.4)
Eos: 1 %
HCV Ab: NONREACTIVE
HIV Screen 4th Generation wRfx: NONREACTIVE
Hematocrit: 36.1 % (ref 34.0–46.6)
Hemoglobin: 11.7 g/dL (ref 11.1–15.9)
Hepatitis B Surface Ag: NEGATIVE
Immature Grans (Abs): 0.1 10*3/uL (ref 0.0–0.1)
Immature Granulocytes: 1 %
Lymphocytes Absolute: 2.3 10*3/uL (ref 0.7–3.1)
Lymphs: 22 %
MCH: 29.8 pg (ref 26.6–33.0)
MCHC: 32.4 g/dL (ref 31.5–35.7)
MCV: 92 fL (ref 79–97)
Monocytes Absolute: 0.5 10*3/uL (ref 0.1–0.9)
Monocytes: 5 %
Neutrophils Absolute: 7.3 10*3/uL — ABNORMAL HIGH (ref 1.4–7.0)
Neutrophils: 71 %
Platelets: 287 10*3/uL (ref 150–450)
RBC: 3.93 x10E6/uL (ref 3.77–5.28)
RDW: 15.9 % — ABNORMAL HIGH (ref 11.7–15.4)
RPR Ser Ql: NONREACTIVE
Rh Factor: POSITIVE
Rubella Antibodies, IGG: 1.42 {index} (ref >0.99)
WBC: 10.4 10*3/uL (ref 3.4–10.8)

## 2023-11-15 LAB — CERVICOVAGINAL ANCILLARY ONLY
Chlamydia: NEGATIVE
Comment: NEGATIVE
Comment: NEGATIVE
Comment: NORMAL
Neisseria Gonorrhea: NEGATIVE
Trichomonas: NEGATIVE

## 2023-11-15 LAB — HEMOGLOBIN A1C
Est. average glucose Bld gHb Est-mCnc: 111 mg/dL
Hgb A1c MFr Bld: 5.5 % (ref 4.8–5.6)

## 2023-11-15 LAB — HCV INTERPRETATION

## 2023-11-16 LAB — CULTURE, OB URINE

## 2023-11-16 LAB — URINE CULTURE, OB REFLEX

## 2023-11-17 LAB — CYTOLOGY - PAP
Comment: NEGATIVE
Diagnosis: NEGATIVE
High risk HPV: NEGATIVE

## 2023-11-20 ENCOUNTER — Telehealth: Payer: Self-pay | Admitting: *Deleted

## 2023-11-20 LAB — PANORAMA PRENATAL TEST FULL PANEL:PANORAMA TEST PLUS 5 ADDITIONAL MICRODELETIONS: FETAL FRACTION: 3.6

## 2023-11-20 NOTE — Telephone Encounter (Signed)
RTC to pt concerned with recent Panorama test results. Pt asking for clarification on terminology and results. Reassured pt of normal/ low risk results and therefore low concern for chromosomal abnormalities. Pt verbalized understanding and relief.

## 2023-11-21 ENCOUNTER — Ambulatory Visit: Payer: 59 | Admitting: Licensed Clinical Social Worker

## 2023-11-21 DIAGNOSIS — F4323 Adjustment disorder with mixed anxiety and depressed mood: Secondary | ICD-10-CM

## 2023-11-21 NOTE — BH Specialist Note (Signed)
Integrated Behavioral Health via Telemedicine Visit  11/22/2023 Paula Dominguez [id number]  Number of Integrated Behavioral Health Clinician visits: 1- Initial Visit  Session Start time: 1015   Session End time: 1120  Total time in minutes: 65   Referring Provider: Dr. Debroah Loop Patient/Family location: In her car Paula Dominguez Provider location: Remote Office All persons participating in visit: Patient and Paula Dominguez Types of Service: Individual psychotherapy and Video visit  I connected with Paula Dominguez and/or Paula Dominguez's patient via  Telephone or Video Enabled Telemedicine Application  (Video is Caregility application) and verified that I am speaking with the correct person using two identifiers. Discussed confidentiality: Yes   I discussed the limitations of telemedicine and the availability of in person appointments.  Discussed there is a possibility of technology failure and discussed alternative modes of communication if that failure occurs.  I discussed that engaging in this telemedicine visit, they consent to the provision of behavioral healthcare and the services will be billed under their insurance.  Patient and/or legal guardian expressed understanding and consented to Telemedicine visit: Yes   Presenting Concerns: Patient and/or family reports the following symptoms/concerns: increased depression and anxiety symptoms; grief.  Duration of problem: Years; Severity of problem: moderate  Patient and/or Family's Strengths/Protective Factors: Social and Emotional competence, Concrete supports in place (healthy food, safe environments, etc.), and Parental Resilience  Goals Addressed: Patient will:  Reduce symptoms of: anxiety and depression   Increase knowledge and/or ability of: coping skills, healthy habits, and self-management skills   Demonstrate ability to: Increase healthy adjustment to current life circumstances and Increase adequate support  systems for patient/family  Progress towards Goals: Ongoing  Interventions: Interventions utilized:  Mindfulness or Relaxation Training, Supportive Counseling, Psychoeducation and/or Health Education, and Supportive Reflection Standardized Assessments completed: Not Needed  Patient and/or Family Response: Patient was present for the session. Patient engaged in processing  previous traumatic experience related to grief and infant loss. She demonstrated insight and emotional engagement throughout this process.  Patient reported increased anxiety and depressive symptoms and expressed a desire to explore new coping strategies and healthy habits. She indicated that she's not interested in pursing medication at this time.  Additionally, patient worked through interpersonal relationship difficulties, reflecting on how these challenges have contributed to heightened stress levels. Together, we explored and identified relationship strategies to address her symptoms.  Patient demonstrated openness to these interventions and appears motivated to implement them outside of the therapeutic setting.   Assessment: Patient currently experiencing heightened anxiety and depressive symptoms, exacerbated by unresolved trauma related to grief and infant loss. Interpersonal relationship difficulties have further contributed to her stress, and she seeks non pharmacological coping strategies to manager her mental health.   Patient may benefit from continued support of this clinic to implement and gain positive coping strategies and healthy habits.  Plan: Follow up with behavioral health clinician on : 12/04/2023 Behavioral recommendations: Paula Dominguez will establish a consistent routine that includes physical activity, adequate sleep and engaging in enjoyable activities to support mental well-being. Remember to take breaks when needed, long showers, try sitting in her car for a break, try waking up earlier for self-care/alone  time. Paula Dominguez will also try journaling thoughts and feelings to process emotions and track progress.  Referral(s): Integrated [employer] (In Clinic)  I discussed the assessment and treatment plan with the patient and/or parent/guardian. They were provided an opportunity to ask questions and all were answered. They agreed with the plan and demonstrated an understanding of  the instructions.   They were advised to call back or seek an in-person evaluation if the symptoms worsen or if the condition fails to improve as anticipated.  Galaxy Borden Cruzita Lederer, LCSWA

## 2023-11-28 ENCOUNTER — Ambulatory Visit: Payer: 59 | Admitting: Rehabilitative and Restorative Service Providers"

## 2023-11-30 ENCOUNTER — Other Ambulatory Visit: Payer: Self-pay | Admitting: Obstetrics & Gynecology

## 2023-11-30 DIAGNOSIS — I10 Essential (primary) hypertension: Secondary | ICD-10-CM

## 2023-11-30 MED ORDER — LABETALOL HCL 200 MG PO TABS
200.0000 mg | ORAL_TABLET | Freq: Two times a day (BID) | ORAL | 5 refills | Status: DC
Start: 2023-11-30 — End: 2024-01-11

## 2023-12-04 ENCOUNTER — Ambulatory Visit (INDEPENDENT_AMBULATORY_CARE_PROVIDER_SITE_OTHER): Payer: 59 | Admitting: Licensed Clinical Social Worker

## 2023-12-04 DIAGNOSIS — F4323 Adjustment disorder with mixed anxiety and depressed mood: Secondary | ICD-10-CM

## 2023-12-04 NOTE — BH Specialist Note (Unsigned)
Integrated Behavioral Health Follow Up In-Person Visit  MRN: [id number] Name: Paula Dominguez  Number of Integrated Behavioral Health Clinician visits: 2- Second Visit  Session Start time: [phone]  Session End time: 1127  Total time in minutes: 92   Types of Service: Individual psychotherapy  Interpretor:No. Interpretor Name and Language: none   Subjective: Paula Dominguez is a 42 y.o. female accompanied by  Self Patient was referred by Dr. Debroah Loop. Patient reports the following symptoms/concerns: increased depression and anxiety, martial conflict.  Duration of problem: years; Severity of problem: moderate  Objective: Mood: Anxious and Affect: Appropriate Risk of harm to self or others: No plan to harm self or others  Life Context: Family and Social: Patient lives in the home with husband and children.  School/Work: Patient does work full time.  Self-Care: Recommended to implement self care breaks, journaling, listening to music, taking walks or long baths.  Life Changes: Infant loss in 2022  Patient and/or Family's Strengths/Protective Factors: Concrete supports in place (healthy food, safe environments, etc.), Sense of purpose, Physical Health (exercise, healthy diet, medication compliance, etc.), Caregiver has knowledge of parenting & child development, and Parental Resilience  Goals Addressed: Patient will:  Reduce symptoms of: anxiety and depression   Increase knowledge and/or ability of: coping skills, healthy habits, and self-management skills   Demonstrate ability to: Increase healthy adjustment to current life circumstances and Increase adequate support systems for patient/family  Progress towards Goals: Ongoing  Interventions: Interventions utilized:  Mindfulness or Management consultant, Supportive Counseling, Psychoeducation and/or Health Education, Communication Skills, and Supportive Reflection Standardized Assessments completed: Not  Needed  Patient and/or Family Response: Patient was present for today's virtual session and successfully recapped the previous visit, noting that while it was intense, she gained significant insight. She reported incorporating strategies such as taking walks, self-care breaks and car breaks, which she feels have improved her mood and reduced symptoms.  Patient continues to experience marital conflict and reports ongoing racing thoughts that interfere with her ability to sleep. She expressed interest in exploring medications for depression and anxiety and plans to discuss this with her provider. She also demonstrated a willingness to utilize conflict resolution skills and engage in collaborative activities with her spouse, such as creating a vision board for individual and marital goals.     Patient Centered Plan: Patient is on the following Treatment Plan(s): depression and anxiety  Assessment: Patient currently experiencing improved mood and reduced symptoms through use of self-care strategies but continues to face challenges with marital conflict and racing thoughts that disrupt her sleep. Patient is motivated to address her mental health concerns, expressing interest in medication and a willingness to work on conflict resolution and shared goal-setting with her spouse. Overall, she is focused on fostering personal and relational growth.    Patient may benefit from continued support of this clinic to implement and gain positive coping strategies and healthy habits. .  Plan: Follow up with behavioral health clinician on : 12/18/2023 Behavioral recommendations: Kiel will continue to practice self-care strategies, such as taking walks and breaks to manage mood and stress effectively. Maris is encouraged to engage in conflict resolution skills and collaborate with spouse on shared goals, such as creating a vision board. Follow up with your provider to explore medication options to manage symptoms.   Referral(s): Integrated [employer] (In Clinic) "From scale of 1-10, how likely are you to follow plan?": Patient agreed to above plan.   Kalin Amrhein Cruzita Lederer, LCSWA

## 2023-12-12 ENCOUNTER — Encounter: Payer: Self-pay | Admitting: Family Medicine

## 2023-12-12 ENCOUNTER — Ambulatory Visit (INDEPENDENT_AMBULATORY_CARE_PROVIDER_SITE_OTHER): Payer: 59 | Admitting: Obstetrics and Gynecology

## 2023-12-12 VITALS — BP 125/82 | HR 91 | Wt 211.1 lb

## 2023-12-12 DIAGNOSIS — Z8759 Personal history of other complications of pregnancy, childbirth and the puerperium: Secondary | ICD-10-CM

## 2023-12-12 DIAGNOSIS — Z23 Encounter for immunization: Secondary | ICD-10-CM | POA: Diagnosis not present

## 2023-12-12 DIAGNOSIS — Z3A18 18 weeks gestation of pregnancy: Secondary | ICD-10-CM

## 2023-12-12 DIAGNOSIS — Z641 Problems related to multiparity: Secondary | ICD-10-CM

## 2023-12-12 DIAGNOSIS — F419 Anxiety disorder, unspecified: Secondary | ICD-10-CM

## 2023-12-12 DIAGNOSIS — O0992 Supervision of high risk pregnancy, unspecified, second trimester: Secondary | ICD-10-CM | POA: Diagnosis not present

## 2023-12-12 DIAGNOSIS — I10 Essential (primary) hypertension: Secondary | ICD-10-CM

## 2023-12-12 DIAGNOSIS — O0990 Supervision of high risk pregnancy, unspecified, unspecified trimester: Secondary | ICD-10-CM

## 2023-12-12 DIAGNOSIS — B360 Pityriasis versicolor: Secondary | ICD-10-CM

## 2023-12-12 DIAGNOSIS — K219 Gastro-esophageal reflux disease without esophagitis: Secondary | ICD-10-CM

## 2023-12-12 MED ORDER — OMEPRAZOLE 40 MG PO CPDR
40.0000 mg | DELAYED_RELEASE_CAPSULE | Freq: Two times a day (BID) | ORAL | 5 refills | Status: DC
Start: 2023-12-12 — End: 2024-01-11

## 2023-12-12 MED ORDER — ASPIRIN 81 MG PO CHEW: 81.0000 mg | CHEWABLE_TABLET | Freq: Every day | ORAL | 4 refills | Status: DC

## 2023-12-12 NOTE — Patient Instructions (Addendum)
Any dandruff shampoo is okay to use as long as it has Selenium sulfide in it. You can lather and apply it like a body was only to effected areas. Let sit for 5-10 minutes before rinsing. Brands such as nizoral, selsun blue, head and shoulders are common.

## 2023-12-12 NOTE — Progress Notes (Signed)
PRENATAL VISIT NOTE  Subjective:  Paula Dominguez is a 42 y.o. [id number] at [redacted]w[redacted]d being seen today for ongoing prenatal care.  She is currently monitored for the following issues for this high-risk pregnancy and has History of pre-eclampsia; History of postpartum depression; Grand multiparity; Genital warts; Iron deficiency anemia; Migraine headache; Moderate major depression (HCC); Chronic hypertension; Supervision of high risk pregnancy, antepartum; Bipolar disorder (HCC); Insomnia due to medical condition; and Tinea versicolor on their problem list.  Patient doing well with no acute concerns today. She reports  increased anxiety .  Contractions: Irritability. Vag. Bleeding: None.  Movement: Absent. Denies leaking of fluid.   The following portions of the patient's history were reviewed and updated as appropriate: allergies, current medications, past family history, past medical history, past social history, past surgical history and problem list. Problem list updated.  Objective:   Vitals:   12/12/23 1054  BP: 125/82  Pulse: 91  Weight: 211 lb 1.6 oz (95.8 kg)    Fetal Status: Fetal Heart Rate (bpm): 154 Fundal Height: 18 cm Movement: Absent     General:  Alert, oriented and cooperative. Patient is in no acute distress.  Skin: Skin is warm and dry. No rash noted.   Cardiovascular: Normal heart rate noted  Respiratory: Normal respiratory effort, no problems with respiration noted  Abdomen: Soft, gravid, appropriate for gestational age.  Pain/Pressure: Present (crampy)     Pelvic: Cervical exam deferred        Extremities: Normal range of motion.  Edema: Trace  Mental Status:  Normal mood and affect. Normal behavior. Normal judgment and thought content.   Assessment and Plan:  Pregnancy: [id number] at [redacted]w[redacted]d  1. Supervision of high risk pregnancy, antepartum (Primary) Continue routine prenatal care Pt states her anxiety is becoming more intense and she states that she has  discussed using alprazolam with other providers.   MD stated unease starting benzos in pregnancy. Pt advised to follow up with her psychiatrist to help manage these mood symptoms. There is concern regarding PTSD/anxiety and may need more assistance than OB can offer.  - AFP, Serum, Open Spina Bifida - Bile acids, total - Flu vaccine trivalent PF, 6mos and older(Flulaval,Afluria,Fluarix,Fluzone) - omeprazole (PRILOSEC) 40 MG capsule; Take 1 capsule (40 mg total) by mouth in the morning and at bedtime.  Dispense: 60 capsule; Refill: 5  2. [redacted] weeks gestation of pregnancy  - AFP, Serum, Open Spina Bifida  3. Chronic hypertension Continue oral medication, pt advised to start baby ASA - aspirin 81 MG chewable tablet; Chew 1 tablet (81 mg total) by mouth daily.  Dispense: 30 tablet; Refill: 4  4. Grand multiparity   5. History of pre-eclampsia  - aspirin 81 MG chewable tablet; Chew 1 tablet (81 mg total) by mouth daily.  Dispense: 30 tablet; Refill: 4  6. Tinea versicolor Pt seen in conjunction with Dr. Celedonio Savage, OB-FP advised using head and shoulders shampoo on the lesions for treatment  7. Gastroesophageal reflux disease without esophagitis  - omeprazole (PRILOSEC) 40 MG capsule; Take 1 capsule (40 mg total) by mouth in the morning and at bedtime.  Dispense: 60 capsule; Refill: 5  Preterm labor symptoms and general obstetric precautions including but not limited to vaginal bleeding, contractions, leaking of fluid and fetal movement were reviewed in detail with the patient.  Please refer to After Visit Summary for other counseling recommendations.   Return in about 4 weeks (around 01/09/2024) for [facility], in person.   Mariel Aloe, MD  Faculty Attending Center for [employer]

## 2023-12-12 NOTE — Progress Notes (Signed)
Pt. Presents for rob. Pt. Is having vaginal dryness and nothing seems to help. No other symptoms.

## 2023-12-13 ENCOUNTER — Telehealth: Payer: Self-pay | Admitting: *Deleted

## 2023-12-13 NOTE — Telephone Encounter (Signed)
Pt called to office stating she thought she was getting 3 Rx's sent to pharmacy and only received 2 - omeprazole and ASA.  Pt states provider mentioned another medication with similar effects as Benadryl was to be sent and possibly something for her anxiety.   Pt would like to know what her alternatives she has for her anxiety.    Please review and clarify/advise.

## 2023-12-14 ENCOUNTER — Ambulatory Visit: Payer: 59 | Admitting: *Deleted

## 2023-12-14 ENCOUNTER — Ambulatory Visit: Payer: 59 | Attending: Obstetrics and Gynecology

## 2023-12-14 ENCOUNTER — Ambulatory Visit: Payer: 59 | Admitting: Obstetrics

## 2023-12-14 ENCOUNTER — Other Ambulatory Visit: Payer: Self-pay | Admitting: Obstetrics and Gynecology

## 2023-12-14 ENCOUNTER — Other Ambulatory Visit: Payer: Self-pay

## 2023-12-14 ENCOUNTER — Other Ambulatory Visit: Payer: Self-pay | Admitting: *Deleted

## 2023-12-14 VITALS — BP 131/77 | HR 88

## 2023-12-14 DIAGNOSIS — E669 Obesity, unspecified: Secondary | ICD-10-CM | POA: Diagnosis not present

## 2023-12-14 DIAGNOSIS — O10012 Pre-existing essential hypertension complicating pregnancy, second trimester: Secondary | ICD-10-CM | POA: Diagnosis not present

## 2023-12-14 DIAGNOSIS — O09522 Supervision of elderly multigravida, second trimester: Secondary | ICD-10-CM

## 2023-12-14 DIAGNOSIS — O09212 Supervision of pregnancy with history of pre-term labor, second trimester: Secondary | ICD-10-CM

## 2023-12-14 DIAGNOSIS — O10912 Unspecified pre-existing hypertension complicating pregnancy, second trimester: Secondary | ICD-10-CM

## 2023-12-14 DIAGNOSIS — O99212 Obesity complicating pregnancy, second trimester: Secondary | ICD-10-CM | POA: Diagnosis not present

## 2023-12-14 DIAGNOSIS — O09292 Supervision of pregnancy with other poor reproductive or obstetric history, second trimester: Secondary | ICD-10-CM

## 2023-12-14 DIAGNOSIS — Z3A19 19 weeks gestation of pregnancy: Secondary | ICD-10-CM

## 2023-12-14 DIAGNOSIS — F419 Anxiety disorder, unspecified: Secondary | ICD-10-CM

## 2023-12-14 DIAGNOSIS — F99 Mental disorder, not otherwise specified: Secondary | ICD-10-CM

## 2023-12-14 DIAGNOSIS — O0942 Supervision of pregnancy with grand multiparity, second trimester: Secondary | ICD-10-CM

## 2023-12-14 DIAGNOSIS — O99342 Other mental disorders complicating pregnancy, second trimester: Secondary | ICD-10-CM

## 2023-12-14 DIAGNOSIS — O10913 Unspecified pre-existing hypertension complicating pregnancy, third trimester: Secondary | ICD-10-CM | POA: Diagnosis present

## 2023-12-14 DIAGNOSIS — O0943 Supervision of pregnancy with grand multiparity, third trimester: Secondary | ICD-10-CM | POA: Diagnosis not present

## 2023-12-14 DIAGNOSIS — O0990 Supervision of high risk pregnancy, unspecified, unspecified trimester: Secondary | ICD-10-CM | POA: Diagnosis present

## 2023-12-14 LAB — AFP, SERUM, OPEN SPINA BIFIDA
AFP MoM: 0.81
AFP Value: 33.7 ng/mL
Gest. Age on Collection Date: 18.7 wk
Maternal Age At EDD: 43.9 a
OSBR Risk 1 IN: 10000
Test Results:: NEGATIVE
Weight: 211 [lb_av]

## 2023-12-14 LAB — BILE ACIDS, TOTAL: Bile Acids Total: 2.9 umol/L (ref 0.0–10.0)

## 2023-12-14 MED ORDER — HYDROXYZINE HCL 10 MG PO TABS: 10.0000 mg | ORAL_TABLET | Freq: Three times a day (TID) | ORAL | 1 refills | Status: DC | PRN

## 2023-12-14 NOTE — Progress Notes (Signed)
MFM Note  Paula Dominguez is currently at 19 weeks and 0 days.  She was was seen for a detailed fetal anatomy scan due to advanced maternal age (42 years old), grand multiparity, and chronic hypertension treated with hydrochlorothiazide 25 mg daily and labetalol 200 mg twice a day.  The patient was extremely anxious today as she experienced a miscarriage around this time last year.    Her past history also includes a baby who died of SIDS at around 69 months of age.  She denies any problems in her current pregnancy.    She had a cell free DNA test earlier in her pregnancy which indicated a low risk for trisomy 23, 74, and 13. A female fetus is predicted.   She was informed that the fetal growth and amniotic fluid level were appropriate for her gestational age.   On today's exam, an intracardiac echogenic focus was noted in the left ventricle of the fetal heart.    The small association between an echogenic focus and Down syndrome was discussed.   Due to the echogenic focus noted today and advanced maternal age, the patient was offered and declined an amniocentesis today for definitive diagnosis of fetal aneuploidy.  She reports that she is comfortable with her negative cell free DNA test.  The patient was informed that anomalies may be missed due to technical limitations. If the fetus is in a suboptimal position or maternal habitus is increased, visualization of the fetus in the maternal uterus may be impaired.  The following were discussed today:  Chronic hypertension in pregnancy  The implications and management of chronic hypertension in pregnancy was discussed.   The patient was advised to continue taking hydrochlorothiazide and labetalol as prescribed for blood pressure control throughout her pregnancy.  The increased risk of superimposed preeclampsia, an indicated preterm delivery, and possible fetal growth restriction due to chronic hypertension in pregnancy was discussed.    Due to chronic hypertension and advanced maternal age, we will continue to follow her with monthly growth ultrasounds.    Weekly fetal testing will be started at around 32 weeks.    Depending on her blood pressures, whether or not she develop preeclampsia, and the weekly fetal testing, delivery will be recommended at between 37 to 39 weeks.    To decrease her risk of superimposed preeclampsia, she should start taking a daily baby aspirin (81 mg daily) for preeclampsia prophylaxis.   A follow-up exam was scheduled in 4 weeks to assess the fetal growth.  The patient stated that all of her questions were answered today and that she was more reassured by today's ultrasound exam.  A total of 45 minutes was spent counseling and coordinating the care for this patient.  Greater than 50% of the time was spent in direct face-to-face contact.

## 2023-12-14 NOTE — Progress Notes (Signed)
Rx for vistaril sent

## 2023-12-15 MED ORDER — CLONAZEPAM 0.5 MG PO TABS: 0.5000 mg | ORAL_TABLET | Freq: Every day | ORAL | 0 refills | Status: DC | PRN

## 2023-12-15 NOTE — Telephone Encounter (Signed)
Per review of PDMP patient has been consistently taking benzos for many months, do not recommend sudden discontinuation at this time. Will refer to psych and have her transfer to [facility].

## 2023-12-18 ENCOUNTER — Encounter: Payer: Self-pay | Admitting: Obstetrics and Gynecology

## 2023-12-18 ENCOUNTER — Encounter: Payer: Self-pay | Admitting: Advanced Practice Midwife

## 2023-12-18 ENCOUNTER — Ambulatory Visit (INDEPENDENT_AMBULATORY_CARE_PROVIDER_SITE_OTHER): Payer: 59 | Admitting: Licensed Clinical Social Worker

## 2023-12-18 DIAGNOSIS — F4323 Adjustment disorder with mixed anxiety and depressed mood: Secondary | ICD-10-CM

## 2023-12-18 NOTE — BH Specialist Note (Signed)
Integrated Behavioral Health Follow Up In-Person Visit  MRN: [id number] Name: Paula Dominguez  Number of Integrated Behavioral Health Clinician visits: 3- Third Visit  Session Start time: 1022  Session End time: 1139  Total time in minutes: 77   Types of Service: Individual psychotherapy  Interpretor:No. Interpretor Name and Language: none   Subjective: Paula Dominguez is a 42 y.o. female accompanied by  Self Patient was referred by Dr. Debroah Loop. Patient reports the following symptoms/concerns: Continued anxiety symptoms and mood instability. Ongoing martial conflict.  Duration of problem: Years; Severity of problem: moderate  Objective: Mood: Anxious and Affect: Appropriate Risk of harm to self or others: No plan to harm self or others  Life Context: Family and Social: Patient lives in the home with husband and children.  School/Work: Patient does work full time.  Self-Care:  Recommended to implement self care breaks, journaling, listening to music, taking walks or long baths.  Life Changes: Infant loss in 2022   Patient and/or Family's Strengths/Protective Factors: Social and Emotional competence, Concrete supports in place (healthy food, safe environments, etc.), Physical Health (exercise, healthy diet, medication compliance, etc.), Caregiver has knowledge of parenting & child development, and Parental Resilience  Goals Addressed: Patient will:  Reduce symptoms of: anxiety and depression   Increase knowledge and/or ability of: coping skills, healthy habits, and self-management skills   Demonstrate ability to: Increase healthy adjustment to current life circumstances and Increase adequate support systems for patient/family  Progress towards Goals: Ongoing  Interventions: Interventions utilized:  Mindfulness or Management consultant, Supportive Counseling, Psychoeducation and/or Health Education, Communication Skills, and Supportive  Reflection Standardized Assessments completed: Not Needed  Patient and/or Family Response: Patient was present for today's session and reported ongoing marital stress, difficulty communicating with her partner and feelings of not being heard or supported. She shared that she was recently prescribed medications (hydroxyzine) for anxiety symptoms. Patient reports feeling as if this medication is like a benadryl, is ineffective, causing drowsiness and irritability upon waking. Patient expressed interest in adjusting her medication routine from mornings to evenings.  She reported continuing to take walks and spending quality time with her children as part of her coping strategies. Patient also expressed a willingness to engage in activities to strengthen her relationship with her partner, including creating a vision board, completing the 76 Love Language quiz with him and reading about the "seasons of marriage" to better understand their current dynamic.    Patient Centered Plan: Patient is on the following Treatment Plan(s): Depression and Anxiety  Assessment: Patient currently experiencing marital stress and challenges with communication, contributing to feelings of frustration and emotional disconnection. Medication prescribed for anxiety is reportedly ineffective and impacting her mood and daily functioning. Patient is actively engaging in positive coping strategies and demonstrates a proactive approach to improving her martial relationship.   Patient may benefit from continued support of this clinic to implement and gain positive coping strategies and healthy habits.  Plan: Follow up with behavioral health clinician on : 01/01/2024 Behavioral recommendations: Follow through with Psychiatry referral. Continue walks, fostering healthy connections with your children. Implement relationship-building activities, such as completing the vision boards and completing the five love language quiz with your  husband to improve communication and emotional connection.  Referral(s): Integrated [employer] (In Clinic) 4. From scale of 1-10, how likely are you to follow plan?": Patient agreeable to above plan.   Paula Dominguez, LCSWA

## 2023-12-27 NOTE — L&D Delivery Note (Addendum)
 Delivery Note Paula Dominguez is a 43 y.o. [id number] at [redacted]w[redacted]d admitted for IOL for SIPE with SF .   GBS Status:  Positive; treated with PCN Maximum Maternal Temperature: 98.92F  Labor course: Augmentation with: AROM and Pitocin. She then progressed to complete.  ROM: 2h 46m with clear fluid  Birth: 03/22/2025 @ 0754 a viable female was delivered via spontaneous vaginal delivery (Presentation: OA). Nuchal cord present: Yes.  Shoulders and body delivered in usual fashion. Infant placed directly on mom's abdomen for bonding/skin-to-skin, baby dried and stimulated. Cord clamped x 2 after 1 minute and cut by MOB. Baby handed off to NICU team. Cord blood collected.  The placenta separated spontaneously and delivered via gentle cord traction.  Pitocin infused rapidly IV per protocol. TXA administered. Given history of postpartum hemorrhage x 4. Fundus firm with massage, but 100 ml released with every fundal massage. Given history 4x PPH and continued bleeding JADA was placed at blood loss 650 mL.  Placenta inspected and appears to be intact with a 3 VC.  Placenta/Cord with the following complications: none .  Cord pH: none Sponge and instrument count were correct x2.  Intrapartum complications:  IUGR, Postpartum Hemorrhage, and pre-eclampsia with severe features  Anesthesia:  epidural Episiotomy: none Lacerations:  none Suture Repair:  none EBL (mL): 750   Infant: APGAR (1 MIN):  7 APGAR (5 MINS):  9  Infant weight: 1810g  Mom to ob specialty care.  Baby to NICU. Placenta to L&D   Plans to Breastfeed Contraception: tubal ligation Circumcision: N/A  Note sent to [facility]: [facility] for pp visit.  Denton Ar CNM, [facility] 03/22/2024 8:37 AM  GME ATTESTATION:  Evaluation and management procedures were performed by the [facility] Medicine Resident under my supervision. I was immediately available and gloved for direct supervision, assistance and direction throughout this encounter.  I  also confirm that I have verified the information documented in the resident's note, and that I have also personally reperformed the pertinent components of the physical exam and all of the medical decision making activities.  I have also made any necessary editorial changes.  Wyn Forster, MD OB Fellow, Faculty Practice [facility], Center for [facility] 03/22/2024 9:10 PM

## 2024-01-01 ENCOUNTER — Ambulatory Visit: Payer: 59 | Admitting: Licensed Clinical Social Worker

## 2024-01-02 ENCOUNTER — Other Ambulatory Visit: Payer: Self-pay | Admitting: Family Medicine

## 2024-01-08 ENCOUNTER — Other Ambulatory Visit: Payer: Self-pay

## 2024-01-08 DIAGNOSIS — F419 Anxiety disorder, unspecified: Secondary | ICD-10-CM

## 2024-01-08 MED ORDER — HYDROXYZINE HCL 10 MG PO TABS: 10.0000 mg | ORAL_TABLET | Freq: Three times a day (TID) | ORAL | 1 refills | Status: DC | PRN

## 2024-01-09 ENCOUNTER — Encounter: Payer: 59 | Admitting: Family Medicine

## 2024-01-11 ENCOUNTER — Ambulatory Visit: Payer: Medicaid Other | Attending: Obstetrics

## 2024-01-11 ENCOUNTER — Encounter: Payer: Self-pay | Admitting: Family Medicine

## 2024-01-11 ENCOUNTER — Other Ambulatory Visit: Payer: Self-pay

## 2024-01-11 ENCOUNTER — Ambulatory Visit (INDEPENDENT_AMBULATORY_CARE_PROVIDER_SITE_OTHER): Payer: Medicaid Other | Admitting: Family Medicine

## 2024-01-11 ENCOUNTER — Ambulatory Visit (INDEPENDENT_AMBULATORY_CARE_PROVIDER_SITE_OTHER): Payer: Medicaid Other | Admitting: Licensed Clinical Social Worker

## 2024-01-11 VITALS — BP 137/92 | HR 101 | Wt 225.0 lb

## 2024-01-11 DIAGNOSIS — E669 Obesity, unspecified: Secondary | ICD-10-CM

## 2024-01-11 DIAGNOSIS — O10912 Unspecified pre-existing hypertension complicating pregnancy, second trimester: Secondary | ICD-10-CM | POA: Diagnosis present

## 2024-01-11 DIAGNOSIS — O10012 Pre-existing essential hypertension complicating pregnancy, second trimester: Secondary | ICD-10-CM | POA: Diagnosis not present

## 2024-01-11 DIAGNOSIS — F99 Mental disorder, not otherwise specified: Secondary | ICD-10-CM

## 2024-01-11 DIAGNOSIS — O09522 Supervision of elderly multigravida, second trimester: Secondary | ICD-10-CM | POA: Diagnosis not present

## 2024-01-11 DIAGNOSIS — F4323 Adjustment disorder with mixed anxiety and depressed mood: Secondary | ICD-10-CM

## 2024-01-11 DIAGNOSIS — Z3A23 23 weeks gestation of pregnancy: Secondary | ICD-10-CM

## 2024-01-11 DIAGNOSIS — Z8759 Personal history of other complications of pregnancy, childbirth and the puerperium: Secondary | ICD-10-CM

## 2024-01-11 DIAGNOSIS — O0990 Supervision of high risk pregnancy, unspecified, unspecified trimester: Secondary | ICD-10-CM

## 2024-01-11 DIAGNOSIS — O09212 Supervision of pregnancy with history of pre-term labor, second trimester: Secondary | ICD-10-CM

## 2024-01-11 DIAGNOSIS — Z641 Problems related to multiparity: Secondary | ICD-10-CM

## 2024-01-11 DIAGNOSIS — I10 Essential (primary) hypertension: Secondary | ICD-10-CM

## 2024-01-11 DIAGNOSIS — O99342 Other mental disorders complicating pregnancy, second trimester: Secondary | ICD-10-CM

## 2024-01-11 DIAGNOSIS — G5603 Carpal tunnel syndrome, bilateral upper limbs: Secondary | ICD-10-CM

## 2024-01-11 DIAGNOSIS — O99212 Obesity complicating pregnancy, second trimester: Secondary | ICD-10-CM

## 2024-01-11 DIAGNOSIS — F321 Major depressive disorder, single episode, moderate: Secondary | ICD-10-CM

## 2024-01-11 DIAGNOSIS — O0992 Supervision of high risk pregnancy, unspecified, second trimester: Secondary | ICD-10-CM

## 2024-01-11 DIAGNOSIS — O0942 Supervision of pregnancy with grand multiparity, second trimester: Secondary | ICD-10-CM

## 2024-01-11 DIAGNOSIS — K219 Gastro-esophageal reflux disease without esophagitis: Secondary | ICD-10-CM

## 2024-01-11 MED ORDER — OMEPRAZOLE 40 MG PO CPDR: 40.0000 mg | DELAYED_RELEASE_CAPSULE | Freq: Two times a day (BID) | ORAL | 5 refills | Status: DC

## 2024-01-11 MED ORDER — LABETALOL HCL 200 MG PO TABS: 200.0000 mg | ORAL_TABLET | Freq: Three times a day (TID) | ORAL | 5 refills | Status: DC

## 2024-01-11 MED ORDER — PNV PRENATAL PLUS MULTIVITAMIN 27-1 MG PO TABS: 1.0000 | ORAL_TABLET | Freq: Every day | ORAL | 3 refills | Status: DC

## 2024-01-11 NOTE — Patient Instructions (Signed)

## 2024-01-11 NOTE — Progress Notes (Signed)
   Subjective:  Lateka Teodosio is a 43 y.o. [id number] at [redacted]w[redacted]d being seen today for ongoing prenatal care.  She is currently monitored for the following issues for this high-risk pregnancy and has History of pre-eclampsia; History of postpartum depression; Grand multiparity; Genital warts; Iron deficiency anemia; Migraine headache; Moderate major depression (HCC); Chronic hypertension; Supervision of high risk pregnancy, antepartum; Bipolar disorder (HCC); Insomnia due to medical condition; and Tinea versicolor on their problem list.  Patient reports no complaints.  Contractions: Not present. Vag. Bleeding: None.  Movement: Present. Denies leaking of fluid.   The following portions of the patient's history were reviewed and updated as appropriate: allergies, current medications, past family history, past medical history, past social history, past surgical history and problem list. Problem list updated.  Objective:   Vitals:   01/11/24 1333  BP: (!) 137/92  Pulse: (!) 101  Weight: 225 lb (102.1 kg)    Fetal Status: Fetal Heart Rate (bpm): 160   Movement: Present     General:  Alert, oriented and cooperative. Patient is in no acute distress.  Skin: Skin is warm and dry. No rash noted.   Cardiovascular: Normal heart rate noted  Respiratory: Normal respiratory effort, no problems with respiration noted  Abdomen: Soft, gravid, appropriate for gestational age. Pain/Pressure: Absent     Pelvic: Vag. Bleeding: None     Cervical exam deferred        Extremities: Normal range of motion.  Edema: Trace  Mental Status: Normal mood and affect. Normal behavior. Normal judgment and thought content.   Urinalysis:      Assessment and Plan:  Pregnancy: [id number] at [redacted]w[redacted]d  1. Supervision of high risk pregnancy, antepartum (Primary) BP mildly elevated, see below FHR normal 28 wk labs next visit  2. Grand multiparity NSVD x7 PPH preventative measures at time of delivery  3. History of  pre-eclampsia On ASA  4. Chronic hypertension On ASA and labetalol 200 BID Mildly elevated BP today Increase to 200 BID Last growth [place] 12/14/2023, EFW 60%, AC 53%, AFI wnl  5. Moderate major depression (HCC) Currently taking clonazepam 0.5 mg daily PRN for anxiety related to SIDS with last child Discussed coming to [facility] clinic not for addiction issues but due to substance exposure for infant and ability to discuss complications/NAS Patient understanding I sent her last clonazepam rx and will continue during pregnancy UDS with verbal consent Plan for more in depth consultation with NICU at next visit  6. Bilateral carpal tunnel syndrome Severe symptoms, will schedule for bilateral injections prior to next ROB visit  Preterm labor symptoms and general obstetric precautions including but not limited to vaginal bleeding, contractions, leaking of fluid and fetal movement were reviewed in detail with the patient. Please refer to After Visit Summary for other counseling recommendations.  Return in 4 weeks (on 02/08/2024) for [facility], ob visit.   Venora Maples, MD

## 2024-01-11 NOTE — Progress Notes (Signed)
Initial substance exposed newborn consult complete in community room. [facility] clinic introduced to mom and substance exposed newborn brochure given. Substance Exposed Consult contact information given to mom and she is aware she can text or call. Will follow at next visit.   Jason Fila NNP-BC

## 2024-01-12 ENCOUNTER — Other Ambulatory Visit: Payer: Self-pay | Admitting: *Deleted

## 2024-01-12 DIAGNOSIS — O99212 Obesity complicating pregnancy, second trimester: Secondary | ICD-10-CM

## 2024-01-12 DIAGNOSIS — O09522 Supervision of elderly multigravida, second trimester: Secondary | ICD-10-CM

## 2024-01-12 DIAGNOSIS — O10912 Unspecified pre-existing hypertension complicating pregnancy, second trimester: Secondary | ICD-10-CM

## 2024-01-16 LAB — TOXASSURE FLEX 15, UR
6-ACETYLMORPHINE IA: NEGATIVE ng/mL
7-aminoclonazepam: 60 ng/mg{creat}
AMPHETAMINES IA: NEGATIVE ng/mL
Alpha-hydroxyalprazolam: NOT DETECTED ng/mg{creat}
Alpha-hydroxymidazolam: NOT DETECTED ng/mg{creat}
Alpha-hydroxytriazolam: NOT DETECTED ng/mg{creat}
Alprazolam: NOT DETECTED ng/mg{creat}
BARBITURATES IA: NEGATIVE ng/mL
BUPRENORPHINE: NEGATIVE
Benzodiazepines: POSITIVE
Buprenorphine: NOT DETECTED ng/mg{creat}
COCAINE METABOLITE IA: NEGATIVE ng/mL
Clonazepam: NOT DETECTED ng/mg{creat}
Creatinine: 91 mg/dL
Desalkylflurazepam: NOT DETECTED ng/mg{creat}
Desmethyldiazepam: NOT DETECTED ng/mg{creat}
Desmethylflunitrazepam: NOT DETECTED ng/mg{creat}
Diazepam: NOT DETECTED ng/mg{creat}
ETHYL ALCOHOL Enzymatic: NEGATIVE g/dL
FENTANYL: NEGATIVE
Fentanyl: NOT DETECTED ng/mg{creat}
Flunitrazepam: NOT DETECTED ng/mg{creat}
Lorazepam: NOT DETECTED ng/mg{creat}
METHADONE IA: NEGATIVE ng/mL
METHADONE MTB IA: NEGATIVE ng/mL
Midazolam: NOT DETECTED ng/mg{creat}
Norbuprenorphine: NOT DETECTED ng/mg{creat}
Norfentanyl: NOT DETECTED ng/mg{creat}
OPIATE CLASS IA: NEGATIVE ng/mL
OXYCODONE CLASS IA: NEGATIVE ng/mL
Oxazepam: NOT DETECTED ng/mg{creat}
PHENCYCLIDINE IA: NEGATIVE ng/mL
TAPENTADOL, IA: NEGATIVE ng/mL
TRAMADOL IA: NEGATIVE ng/mL
Temazepam: NOT DETECTED ng/mg{creat}

## 2024-01-16 LAB — CANNABINOIDS, MS, UR RFX
Cannabinoids Confirmation: POSITIVE
Carboxy-THC: 78 ng/mg{creat}

## 2024-01-18 NOTE — BH Specialist Note (Signed)
Integrated Behavioral Health via Telemedicine Visit  01/18/2024 Knya Endris [id number]  Number of Integrated Behavioral Health Clinician visits: 4- Fourth Visit  Session Start time: 1115   Session End time: 1140  Total time in minutes: 25   Referring Provider: Dr. Debroah Loop Patient/Family location: At home [facility] Provider location: Remote Office All persons participating in visit: Patient and [facility] Types of Service: Individual psychotherapy and Video visit  I connected with Harriet [place] and/or Lysle Rubens Sago's patient via  Telephone or Video Enabled Telemedicine Application  (Video is Caregility application) and verified that I am speaking with the correct person using two identifiers. Discussed confidentiality: Yes   I discussed the limitations of telemedicine and the availability of in person appointments.  Discussed there is a possibility of technology failure and discussed alternative modes of communication if that failure occurs.  I discussed that engaging in this telemedicine visit, they consent to the provision of behavioral healthcare and the services will be billed under their insurance.  Patient and/or legal guardian expressed understanding and consented to Telemedicine visit: Yes   Presenting Concerns: Patient and/or family reports the following symptoms/concerns: ongoing martial conflict impacting depression and anxiety symptoms.  Duration of problem: Months; Severity of problem: moderate  Patient and/or Family's Strengths/Protective Factors: Social and Emotional competence, Concrete supports in place (healthy food, safe environments, etc.), Physical Health (exercise, healthy diet, medication compliance, etc.), Caregiver has knowledge of parenting & child development, and Parental Resilience  Goals Addressed: Patient will:  Reduce symptoms of: anxiety and depression   Increase knowledge and/or ability of: coping skills, healthy habits,  and self-management skills   Demonstrate ability to: Increase healthy adjustment to current life circumstances and Increase adequate support systems for patient/family  Progress towards Goals: Ongoing  Interventions: Interventions utilized:  Solution-Focused Strategies, Mindfulness or Management consultant, Supportive Counseling, Communication Skills, and Supportive Reflection Standardized Assessments completed: Not Needed  Patient and/or Family Response: Patient attended today's virtual session and reported ongoing marital conflict, which has been impacting her anxiety. She processed a recent disagreement with her husband that did not end as she had hoped and explored concerns about her and her children's future if the marriage does not progress as expected. The patient expressed a willingness to explore conflict resolution skills to better navigate and manage disagreements at home. Due to the presence of her children, the patient was unable to provide full details during this session and agreed to schedule a follow-up visit to further process the conflict and her concerns.   Assessment: Patient currently experiencing increased anxiety due to ongoing marital conflict and a recent disagreement with her husband. She is concerned about the future of her marriage and its potential impact on her children, but remains open to exploring ways to resolve conflicts and improve communication.   Patient may benefit from continued support of this clinic to implement and gain positive coping strategies and healthy habits.  .  Plan: Follow up with behavioral health clinician on : 01/22/2024 Behavioral recommendations: Brenlyn is encouraged to explore and practice conflict resolution skills to better manage disagreements in her marriage. Consider journaling your thoughts and feelings first and revisiting situations/conflict at a later time.  Referral(s): Integrated [employer] (In Clinic)  I  discussed the assessment and treatment plan with the patient and/or parent/guardian. They were provided an opportunity to ask questions and all were answered. They agreed with the plan and demonstrated an understanding of the instructions.   They were advised to call back or  seek an in-person evaluation if the symptoms worsen or if the condition fails to improve as anticipated.  Blake Vetrano Cruzita Lederer, LCSWA

## 2024-01-22 ENCOUNTER — Ambulatory Visit (INDEPENDENT_AMBULATORY_CARE_PROVIDER_SITE_OTHER): Payer: Medicaid Other | Admitting: Licensed Clinical Social Worker

## 2024-01-22 DIAGNOSIS — F4323 Adjustment disorder with mixed anxiety and depressed mood: Secondary | ICD-10-CM

## 2024-01-22 NOTE — BH Specialist Note (Unsigned)
Integrated Behavioral Health Follow Up In-Person Visit  MRN: [id number] Name: Paula Dominguez  Number of Integrated Behavioral Health Clinician visits: 5-Fifth Visit  Session Start time: [phone]  Session End time: 1105  Total time in minutes: 70   Types of Service: Individual psychotherapy  Interpretor:No. Interpretor Name and Language: None   Subjective: Paula Dominguez is a 43 y.o. female accompanied by  daughter who sat in the waiting area Patient was referred by Dr. Debroah Loop. Patient reports the following symptoms/concerns: ongoing stressors  Duration of problem: years; Severity of problem: moderate  Objective: Mood: Euthymic and Affect: Appropriate Risk of harm to self or others: No plan to harm self or others  Life Context: Family and Social:  Patient lives in the home with husband and children.  School/Work: Patient works full time.  Self-Care: Recommended to implement self care breaks, journaling, listening to music, taking walks or long baths.  Life Changes: Infant loss in 2022  Patient and/or Family's Strengths/Protective Factors: Social and Emotional competence, Concrete supports in place (healthy food, safe environments, etc.), Physical Health (exercise, healthy diet, medication compliance, etc.), and Caregiver has knowledge of parenting & child development  Goals Addressed: Patient will:  Reduce symptoms of: anxiety and depression   Increase knowledge and/or ability of: coping skills, healthy habits, and self-management skills   Demonstrate ability to: Increase healthy adjustment to current life circumstances and Increase adequate support systems for patient/family  Progress towards Goals: Ongoing  Interventions: Interventions utilized:  Solution-Focused Strategies, Mindfulness or Relaxation Training, Supportive Counseling, and Supportive Reflection Standardized Assessments completed: Not Needed  Patient and/or Family Response: The patient  worked to process ongoing marital issues that remain unresolved. Despite these challenges, she reports having successfully shifted her mindset to focus on aspects of her life that are important to her, such as her pregnancy, health, motherhood, and staying stress-free. The patient mentioned discussing the possibility of her husband joining a session, but he has declined. She also reports that she was unable to complete the vision board assignment with her husband but was able to complete it with her children. The patient is continuing her medications, which she finds helpful, and reports using coping strategies such as taking walks, spending time with her children, and taking breaks throughout the day.  Patient Centered Plan: Patient is on the following Treatment Plan(s): Adjustments   Assessment: Patient currently experiencing ongoing marital issues that remain unresolved, but she has shifted her focus to prioritize her pregnancy, health, and motherhood in an effort to stay stress-free. She continues to navigate life stressors while using coping strategies like taking walks, spending time with her children, and adhering to her medication regimen.   Patient may benefit from continued support of this clinic to implement and gain positive coping strategies and healthy habits.  Plan: Follow up with behavioral health clinician on : 02/05/2024 Behavioral recommendations: Brigid will continue using her coping strategies, such as taking walks, spending time with her children, and taking regular breaks, to manage stress. Continue focusing on things that bring you comfort such as your children, health and your pregnancy.  Referral(s): Integrated [employer] (In Clinic) "From scale of 1-10, how likely are you to follow plan?": Patient agreeable to above plan.   Kathrynne Kulinski Cruzita Lederer, LCSWA

## 2024-01-29 ENCOUNTER — Other Ambulatory Visit: Payer: Self-pay | Admitting: Family Medicine

## 2024-01-29 NOTE — Telephone Encounter (Signed)
Received RX request for hydrochlorothiazide 25mg  and Flexeril 10mg 

## 2024-01-30 ENCOUNTER — Ambulatory Visit: Payer: Medicaid Other | Admitting: Advanced Practice Midwife

## 2024-01-30 ENCOUNTER — Encounter: Payer: Self-pay | Admitting: Advanced Practice Midwife

## 2024-01-30 ENCOUNTER — Other Ambulatory Visit (HOSPITAL_COMMUNITY)
Admission: RE | Admit: 2024-01-30 | Discharge: 2024-01-30 | Disposition: A | Discharge status: Home / Self Care | Payer: Medicaid Other | Source: Ambulatory Visit | Attending: Family Medicine | Admitting: Family Medicine

## 2024-01-30 ENCOUNTER — Encounter: Payer: Medicaid Other | Admitting: Family Medicine

## 2024-01-30 VITALS — BP 141/86 | HR 82 | Wt 223.0 lb

## 2024-01-30 DIAGNOSIS — R109 Unspecified abdominal pain: Secondary | ICD-10-CM

## 2024-01-30 DIAGNOSIS — O26899 Other specified pregnancy related conditions, unspecified trimester: Secondary | ICD-10-CM | POA: Diagnosis present

## 2024-01-30 DIAGNOSIS — O0990 Supervision of high risk pregnancy, unspecified, unspecified trimester: Secondary | ICD-10-CM | POA: Diagnosis present

## 2024-01-30 DIAGNOSIS — G5603 Carpal tunnel syndrome, bilateral upper limbs: Secondary | ICD-10-CM

## 2024-01-30 DIAGNOSIS — F419 Anxiety disorder, unspecified: Secondary | ICD-10-CM

## 2024-01-30 DIAGNOSIS — I10 Essential (primary) hypertension: Secondary | ICD-10-CM | POA: Diagnosis not present

## 2024-01-30 DIAGNOSIS — O99340 Other mental disorders complicating pregnancy, unspecified trimester: Secondary | ICD-10-CM

## 2024-01-30 DIAGNOSIS — O09899 Supervision of other high risk pregnancies, unspecified trimester: Secondary | ICD-10-CM

## 2024-01-30 DIAGNOSIS — Z8751 Personal history of pre-term labor: Secondary | ICD-10-CM | POA: Insufficient documentation

## 2024-01-30 DIAGNOSIS — M62838 Other muscle spasm: Secondary | ICD-10-CM

## 2024-01-30 DIAGNOSIS — Z3A25 25 weeks gestation of pregnancy: Secondary | ICD-10-CM

## 2024-01-30 HISTORY — DX: Supervision of other high risk pregnancies, unspecified trimester: O09.899

## 2024-01-30 MED ORDER — PROGESTERONE 200 MG PO CAPS: ORAL_CAPSULE | ORAL | 3 refills | Status: DC

## 2024-01-30 MED ORDER — CYCLOBENZAPRINE HCL 10 MG PO TABS: 10.0000 mg | ORAL_TABLET | Freq: Two times a day (BID) | ORAL | 2 refills | Status: DC | PRN

## 2024-01-30 MED ORDER — PNV PRENATAL PLUS MULTIVITAMIN 27-1 MG PO TABS: 1.0000 | ORAL_TABLET | Freq: Every day | ORAL | 6 refills | Status: AC

## 2024-01-30 MED ORDER — LABETALOL HCL 300 MG PO TABS: 300.0000 mg | ORAL_TABLET | Freq: Three times a day (TID) | ORAL | 3 refills | Status: DC

## 2024-01-30 MED ORDER — CLONAZEPAM 0.5 MG PO TABS: 0.5000 mg | ORAL_TABLET | Freq: Two times a day (BID) | ORAL | 3 refills | Status: DC | PRN

## 2024-01-30 NOTE — Progress Notes (Addendum)
 PRENATAL VISIT NOTE  Subjective:  Paula Dominguez is a 43 y.o. [id number] at [redacted]w[redacted]d being seen today for ongoing prenatal care.  She is currently monitored for the following issues for this high-risk pregnancy and has History of pre-eclampsia; History of postpartum depression; Grand multiparity; Genital warts; Iron deficiency anemia; Migraine headache; Moderate major depression (HCC); Chronic hypertension; Supervision of high risk pregnancy, antepartum; Bipolar disorder (HCC); Insomnia due to medical condition; Tinea versicolor; Bilateral carpal tunnel syndrome; and History of preterm delivery, currently pregnant on their problem list.  Patient reports: Painful contractions a few days ago. Did not go to hospital. Rare contractions now.  Severe anxiety not controlled w/ Klonopin  0.5 QD. Tried a vape pen intended for anxiety. UDS showed +THC. Pt very worried about this. Did not mean to take [facility] and has not continued. Sees multiple therapists and utilizes healthy coping strategies. Reports the only previous meds for anxiety and depression that seemed to help were Benzodiazepines.  Right mid abd pain Carpel tunnel Sx not relief w/ wearing wrist brace. Requesting steroid injection. Decreased fetal mvmt a few days ago. Nml today.   Chronic HA's.  Denies Vag. Bleeding, vaginal discharge (aside from what she experiences w/ Prometrium ), urinary complaints or LOF.   The following portions of the patient's history were reviewed and updated as appropriate: allergies, current medications, past family history, past medical history, past social history, past surgical history and problem list.   Objective:   Vitals:   01/30/24 1604 01/30/24 1708  BP: (!) 141/87 (!) 141/86  Pulse: 82 82  Weight: 223 lb (101.2 kg)     Fetal Status: Fetal Heart Rate (bpm): 160   Movement: Present     General:  Alert, oriented and cooperative. Patient is in no acute distress. Anxious, occasionally tearful   Skin:  Skin is warm and dry. No rash noted.   Cardiovascular: Normal heart rate noted  Respiratory: Normal respiratory effort, no problems with respiration noted  Abdomen: Soft, gravid, appropriate for gestational age.  Pain/Pressure: Present     Pelvic: Cervical exam deferred Dilation: Closed      Extremities: Normal range of motion.  Edema: Moderate pitting, indentation subsides rapidly  Mental Status: Normal mood and affect. Normal behavior. Normal judgment and thought content.   Assessment and Plan:  Pregnancy: [id number] at [redacted]w[redacted]d 1. Supervision of high risk pregnancy, antepartum (Primary) - Prenatal Vit-Fe Fumarate-FA (PNV PRENATAL PLUS MULTIVITAMIN) 27-1 MG TABS; Take 1 capsule by mouth daily.  Dispense: 90 tablet; Refill: 6  2. Chronic hypertension - BP mildly elevated taking current dose as directed. Some HA's C/W her chronic HA's. Discussed labs today vs [facility]. Pt requests at [facility].  - labetalol  (NORMODYNE ) 300 MG tablet; Take 1 tablet (300 mg total) by mouth 3 (three) times daily.  Dispense: 90 tablet; Refill: 3  3. Abdominal pain affecting pregnancy - Cervix closed. PTL precautions.  - Culture, OB Urine - Cervicovaginal ancillary only( [place])  4. Bilateral carpal tunnel syndrome - No improvement w/ Braces x several months. Motrin  not helping (recommended using sparingly and not after 28 weeks) - Will come back to have Dr. Lola do steroid injection ASAP.   5. [redacted] weeks gestation of pregnancy - 28 week labs at [facility].   6. Anxiety during pregnancy - Extremely anxious on Klonopin  0.5 QD. Verbalizes feeling guilty about using CBD pen. Plans to not continue. CNM supported that choice and discussed that contents of vape pens are not regulated and one can't be sure what  they are taking. Lengthy discussion about previous meds for anxiety and depression. None really seemed to help more that Benzodiazepines. Suggested seeing psychiatrist for med management. Pt prefers to increase Klonopin . Will  increase to 0.5 BID PRN per consult with Dr. Lola, but may still want to consider have psych manage meds in the future. Discussed that baby can have NAS from Benzos.   - clonazePAM  (KLONOPIN ) 0.5 MG tablet; Take 1 tablet (0.5 mg total) by mouth 2 (two) times daily as needed for anxiety.  Dispense: 30 tablet; Refill: 3  7. History of preterm delivery, currently pregnant - Cervix closed today. CL 3.1 on US  .1/16. - Pt strongly desires to continue Progesterone .  - progesterone  (PROMETRIUM ) 200 MG capsule; Place one capsule vaginally at bedtime  Dispense: 30 capsule; Refill: 3  8. Severe anxiety  - clonazePAM  (KLONOPIN ) 0.5 MG tablet; Take 1 tablet (0.5 mg total) by mouth 2 (two) times daily as needed for anxiety.  Dispense: 30 tablet; Refill: 3  9. Muscle spasm  - cyclobenzaprine  (FLEXERIL ) 10 MG tablet; Take 1 tablet (10 mg total) by mouth 2 (two) times daily as needed for muscle spasms.  Dispense: 20 tablet; Refill: 2  10. AMA - Antenatal testing per MFM  Preterm labor symptoms and general obstetric precautions including but not limited to vaginal bleeding, contractions, leaking of fluid and fetal movement were reviewed in detail with the patient. Please refer to After Visit Summary for other counseling recommendations.   Return in about 2 weeks (around 02/13/2024) for REACH/GTT.  Future Appointments  Date Time Provider Department Center  02/02/2024  8:20 AM [facility] LAB [facility]  02/05/2024 10:15 AM [place], Cquadayshia L, LCSWA [facility] None  02/13/2024  1:55 PM Lola Donnice HERO, MD [facility]  02/15/2024  9:15 AM [facility] NURSE [facility]  02/15/2024  9:30 AM [facility] US1 [facility]  03/14/2024  9:15 AM [facility] NURSE [facility]  03/14/2024  9:30 AM [facility] US1 [facility]    Karas Pickerill  Claudene, CNM

## 2024-02-01 ENCOUNTER — Other Ambulatory Visit: Payer: Self-pay

## 2024-02-01 DIAGNOSIS — O0990 Supervision of high risk pregnancy, unspecified, unspecified trimester: Secondary | ICD-10-CM

## 2024-02-01 LAB — CERVICOVAGINAL ANCILLARY ONLY
Bacterial Vaginitis (gardnerella): NEGATIVE
Candida Glabrata: NEGATIVE
Candida Vaginitis: POSITIVE — AB
Chlamydia: NEGATIVE
Comment: NEGATIVE
Comment: NEGATIVE
Comment: NEGATIVE
Comment: NEGATIVE
Comment: NEGATIVE
Comment: NORMAL
Neisseria Gonorrhea: NEGATIVE
Trichomonas: NEGATIVE

## 2024-02-01 LAB — CULTURE, OB URINE

## 2024-02-01 LAB — URINE CULTURE, OB REFLEX

## 2024-02-02 ENCOUNTER — Other Ambulatory Visit: Payer: Self-pay

## 2024-02-02 ENCOUNTER — Other Ambulatory Visit: Payer: Medicaid Other

## 2024-02-02 DIAGNOSIS — O0990 Supervision of high risk pregnancy, unspecified, unspecified trimester: Secondary | ICD-10-CM

## 2024-02-03 LAB — CBC
Hematocrit: 31.4 % — ABNORMAL LOW (ref 34.0–46.6)
Hemoglobin: 10.6 g/dL — ABNORMAL LOW (ref 11.1–15.9)
MCH: 32.5 pg (ref 26.6–33.0)
MCHC: 33.8 g/dL (ref 31.5–35.7)
MCV: 96 fL (ref 79–97)
Platelets: 213 10*3/uL (ref 150–450)
RBC: 3.26 x10E6/uL — ABNORMAL LOW (ref 3.77–5.28)
RDW: 13.2 % (ref 11.7–15.4)
WBC: 10.1 10*3/uL (ref 3.4–10.8)

## 2024-02-03 LAB — GLUCOSE TOLERANCE, 2 HOURS W/ 1HR
Glucose, 1 hour: 72 mg/dL (ref 70–179)
Glucose, 2 hour: 59 mg/dL — ABNORMAL LOW (ref 70–152)
Glucose, Fasting: 68 mg/dL — ABNORMAL LOW (ref 70–91)

## 2024-02-03 LAB — RPR: RPR Ser Ql: NONREACTIVE

## 2024-02-03 LAB — HIV ANTIBODY (ROUTINE TESTING W REFLEX): HIV Screen 4th Generation wRfx: NONREACTIVE

## 2024-02-04 ENCOUNTER — Encounter: Payer: Self-pay | Admitting: Advanced Practice Midwife

## 2024-02-05 ENCOUNTER — Ambulatory Visit: Payer: 59 | Admitting: Licensed Clinical Social Worker

## 2024-02-05 NOTE — BH Specialist Note (Signed)
 Patient no showed for today's visit. Link was sent to patient's phone and a call was provided.

## 2024-02-06 ENCOUNTER — Ambulatory Visit (INDEPENDENT_AMBULATORY_CARE_PROVIDER_SITE_OTHER): Payer: Medicaid Other | Admitting: Family Medicine

## 2024-02-06 VITALS — BP 137/70 | HR 89 | Wt 225.2 lb

## 2024-02-06 DIAGNOSIS — G5603 Carpal tunnel syndrome, bilateral upper limbs: Secondary | ICD-10-CM | POA: Diagnosis not present

## 2024-02-06 DIAGNOSIS — K5900 Constipation, unspecified: Secondary | ICD-10-CM

## 2024-02-06 DIAGNOSIS — B3731 Acute candidiasis of vulva and vagina: Secondary | ICD-10-CM

## 2024-02-06 MED ORDER — POLYETHYLENE GLYCOL 3350 17 GM/SCOOP PO POWD: 17.0000 g | Freq: Every day | ORAL | 2 refills | Status: AC

## 2024-02-06 MED ORDER — FLUCONAZOLE 150 MG PO TABS
150.0000 mg | ORAL_TABLET | Freq: Once | ORAL | 0 refills | Status: AC
Start: 2024-02-06 — End: 2024-02-06

## 2024-02-06 NOTE — Progress Notes (Signed)
Carpal Tunnel Injection Procedure note:  Patient diagnosed with carpal tunnel syndrome during pregnancy. Has tried splints without relief of symptoms. Current symptoms are bilateral wrist/hands.  Discussed role of steroid injection to help alleviate symptoms as conservative measures have not provided relief. Reviewed risks/benefits of procedure. Reviewed complications of procedure which include but are not limited to bleeding, elevated blood glucose levels, infection, median nerve injury, pain, and paresthesias  Patient instructed to inform physician of any tingling or loss of sensation in hand.   After obtaining informed verbal and written consent for carpal tunnel injection, proper site was identified on the right wrist one cm medial to the palmaris longus tendon and one cm proximal to the distal palmar crease. The area was cleaned with alcohol and then anesthetized with hurricane spray. A mixture of 1 cc 6 mg/mL betamethasone and 1 cc of 1% lidocaine was injected by directing the needle towards the base of the thumb at a 45 degree angle at a depth of approximately one centimeter. Mixture flowed easily and patient reported no paresthesias during the procedure. A bandaid was then applied and patient instructed to mobilize wrist for remainder of day to help solution spread. Patient tolerated the procedure well.

## 2024-02-13 ENCOUNTER — Ambulatory Visit (INDEPENDENT_AMBULATORY_CARE_PROVIDER_SITE_OTHER): Payer: Medicaid Other | Admitting: Family Medicine

## 2024-02-13 ENCOUNTER — Other Ambulatory Visit: Payer: Self-pay

## 2024-02-13 ENCOUNTER — Encounter: Payer: Self-pay | Admitting: Family Medicine

## 2024-02-13 ENCOUNTER — Ambulatory Visit: Payer: Medicaid Other

## 2024-02-13 ENCOUNTER — Ambulatory Visit: Payer: Medicaid Other | Attending: Maternal & Fetal Medicine

## 2024-02-13 VITALS — BP 135/89 | HR 89 | Wt 225.4 lb

## 2024-02-13 DIAGNOSIS — D509 Iron deficiency anemia, unspecified: Secondary | ICD-10-CM

## 2024-02-13 DIAGNOSIS — O09292 Supervision of pregnancy with other poor reproductive or obstetric history, second trimester: Secondary | ICD-10-CM

## 2024-02-13 DIAGNOSIS — Z8759 Personal history of other complications of pregnancy, childbirth and the puerperium: Secondary | ICD-10-CM | POA: Diagnosis not present

## 2024-02-13 DIAGNOSIS — O09892 Supervision of other high risk pregnancies, second trimester: Secondary | ICD-10-CM

## 2024-02-13 DIAGNOSIS — F321 Major depressive disorder, single episode, moderate: Secondary | ICD-10-CM

## 2024-02-13 DIAGNOSIS — O99212 Obesity complicating pregnancy, second trimester: Secondary | ICD-10-CM | POA: Insufficient documentation

## 2024-02-13 DIAGNOSIS — O09522 Supervision of elderly multigravida, second trimester: Secondary | ICD-10-CM | POA: Diagnosis not present

## 2024-02-13 DIAGNOSIS — O0992 Supervision of high risk pregnancy, unspecified, second trimester: Secondary | ICD-10-CM | POA: Diagnosis not present

## 2024-02-13 DIAGNOSIS — O09899 Supervision of other high risk pregnancies, unspecified trimester: Secondary | ICD-10-CM

## 2024-02-13 DIAGNOSIS — F319 Bipolar disorder, unspecified: Secondary | ICD-10-CM

## 2024-02-13 DIAGNOSIS — E669 Obesity, unspecified: Secondary | ICD-10-CM

## 2024-02-13 DIAGNOSIS — F17200 Nicotine dependence, unspecified, uncomplicated: Secondary | ICD-10-CM

## 2024-02-13 DIAGNOSIS — F99 Mental disorder, not otherwise specified: Secondary | ICD-10-CM | POA: Diagnosis not present

## 2024-02-13 DIAGNOSIS — G5603 Carpal tunnel syndrome, bilateral upper limbs: Secondary | ICD-10-CM

## 2024-02-13 DIAGNOSIS — O10912 Unspecified pre-existing hypertension complicating pregnancy, second trimester: Secondary | ICD-10-CM | POA: Diagnosis present

## 2024-02-13 DIAGNOSIS — O0942 Supervision of pregnancy with grand multiparity, second trimester: Secondary | ICD-10-CM

## 2024-02-13 DIAGNOSIS — Z3A27 27 weeks gestation of pregnancy: Secondary | ICD-10-CM

## 2024-02-13 DIAGNOSIS — O10919 Unspecified pre-existing hypertension complicating pregnancy, unspecified trimester: Secondary | ICD-10-CM

## 2024-02-13 DIAGNOSIS — O0990 Supervision of high risk pregnancy, unspecified, unspecified trimester: Secondary | ICD-10-CM

## 2024-02-13 DIAGNOSIS — Z23 Encounter for immunization: Secondary | ICD-10-CM | POA: Diagnosis not present

## 2024-02-13 DIAGNOSIS — Z641 Problems related to multiparity: Secondary | ICD-10-CM | POA: Diagnosis not present

## 2024-02-13 DIAGNOSIS — O99342 Other mental disorders complicating pregnancy, second trimester: Secondary | ICD-10-CM | POA: Diagnosis not present

## 2024-02-13 DIAGNOSIS — O09212 Supervision of pregnancy with history of pre-term labor, second trimester: Secondary | ICD-10-CM

## 2024-02-13 DIAGNOSIS — O10012 Pre-existing essential hypertension complicating pregnancy, second trimester: Secondary | ICD-10-CM

## 2024-02-13 DIAGNOSIS — O09523 Supervision of elderly multigravida, third trimester: Secondary | ICD-10-CM

## 2024-02-13 MED ORDER — NICOTINE 21-14-7 MG/24HR TD KIT: 1.0000 | PACK | TRANSDERMAL | 0 refills | Status: DC

## 2024-02-13 MED ORDER — NICOTINE POLACRILEX 4 MG MT LOZG: 4.0000 mg | LOZENGE | OROMUCOSAL | 0 refills | Status: AC | PRN

## 2024-02-13 NOTE — Progress Notes (Signed)
   Subjective:   Paula Dominguez is a 43 y.o. [id number] here today for ongoing prenatal care and anxiety management.  She reports feeling well.  Health Maintenance Due  Topic Date Due   COVID-19 Vaccine (1 - 2024-25 season) Never done    Past Medical History:  Diagnosis Date   Anemia    Anxiety and depression    Blood transfusion without reported diagnosis    2014, 2018   GERD (gastroesophageal reflux disease)    History of preterm delivery, currently pregnant 01/30/2024   Hx of preeclampsia, prior pregnancy, currently pregnant    prior pregnancy   Hypertension     Past Surgical History:  Procedure Laterality Date   HEMORRHOID SURGERY      The following portions of the patient's history were reviewed and updated as appropriate: allergies, current medications, past family history, past medical history, past social history, past surgical history and problem list.     Objective:    Paula Dominguez is well appearing and in no distress.  Introduced myself as part of [employer] and briefly reviewed our role in her care.  No new questions or issues at present.    Assessment and Plan:   Problem List Items Addressed This Visit       Cardiovascular and Mediastinum   Chronic hypertension during pregnancy, antepartum     Nervous and Auditory   Bilateral carpal tunnel syndrome     Other   History of pre-eclampsia   Grand multiparity   Iron deficiency anemia   Moderate major depression (HCC)   Relevant Orders   Ambulatory referral to Psychiatry   AMA (advanced maternal age) multigravida 35+   Supervision of high risk pregnancy, antepartum - Primary   Bipolar disorder (HCC)   Relevant Orders   Ambulatory referral to Psychiatry   History of preterm delivery, currently pregnant    Routine preventative health maintenance measures emphasized. Please refer to After Visit Summary for other counseling recommendations.   Return in 2 weeks (on 02/27/2024) for REACH clinic,  ob visit.    Total face-to-face time with patient: 10 minutes.  Over 50% of encounter was spent on counseling and coordination of care.   Hubert Azure, NNP-BC Neonatal Nurse Practitioner [facility] [phone]

## 2024-02-13 NOTE — Progress Notes (Signed)
   Subjective:  Paula Dominguez is a 43 y.o. [id number] at [redacted]w[redacted]d being seen today for ongoing prenatal care.  She is currently monitored for the following issues for this high-risk pregnancy and has History of pre-eclampsia; History of postpartum depression; Grand multiparity; Genital warts; Iron deficiency anemia; Migraine headache; Moderate major depression (HCC); AMA (advanced maternal age) multigravida 35+; Chronic hypertension during pregnancy, antepartum; Supervision of high risk pregnancy, antepartum; Bipolar disorder (HCC); Insomnia due to medical condition; Tinea versicolor; Bilateral carpal tunnel syndrome; and History of preterm delivery, currently pregnant on their problem list.  Patient reports no complaints.  Contractions: Irregular. Vag. Bleeding: None.  Movement: Present. Denies leaking of fluid.   The following portions of the patient's history were reviewed and updated as appropriate: allergies, current medications, past family history, past medical history, past social history, past surgical history and problem list. Problem list updated.  Objective:   Vitals:   02/13/24 1445  BP: 135/89  Pulse: 89  Weight: 225 lb 6.4 oz (102.2 kg)    Fetal Status: Fetal Heart Rate (bpm): 145   Movement: Present     General:  Alert, oriented and cooperative. Patient is in no acute distress.  Skin: Skin is warm and dry. No rash noted.   Cardiovascular: Normal heart rate noted  Respiratory: Normal respiratory effort, no problems with respiration noted  Abdomen: Soft, gravid, appropriate for gestational age. Pain/Pressure: Absent     Pelvic: Vag. Bleeding: None     Cervical exam deferred        Extremities: Normal range of motion.  Edema: None  Mental Status: Normal mood and affect. Normal behavior. Normal judgment and thought content.   Urinalysis:      PDMP reviewed during this encounter.   Last UDS:  01/11/2024       Assessment and Plan:  Pregnancy: [id number] at  [redacted]w[redacted]d  1. Supervision of high risk pregnancy, antepartum (Primary) BP and FHR normal Offered tdap, accepts  2. Grand multiparity NSVD x7 PPH preventative measurees at time of delivery  3. History of pre-eclampsia On ASA  4. Chronic hypertension during pregnancy, antepartum On ASA and labetalol 300 TID, normotensive today Last growth [place] normal, has f/u scheduled today Antenatal testing per MFM guidelines  5. Moderate major depression (HCC) Currently on klonopin 0.5 mg BID PRN, reports her anxiety is better since increased dose of klonopin Have previously referred for psychiatric care as she exceeds our ability to adequately address her psychiatric needs, referral placed again today and stressed importance of following up on this UDS today with verbal consent  6. Bilateral carpal tunnel syndrome S/p injection of the R wrist, today reports pain is better Still thinking about getting L side injected but defers for today  7. Multigravida of advanced maternal age in third trimester On ASA, antenatal testing per MFM guidelines  8. History of preterm delivery, currently pregnant Iatrogenic at 35 weeks in setting of severe pre-e  9. Iron deficiency anemia, unspecified iron deficiency anemia type Lab Results  Component Value Date   HGB 10.6 (L) 02/02/2024   10. Tobacco use disorder Nicotine patches prescribed  Preterm labor symptoms and general obstetric precautions including but not limited to vaginal bleeding, contractions, leaking of fluid and fetal movement were reviewed in detail with the patient. Please refer to After Visit Summary for other counseling recommendations.  Return in 2 weeks (on 02/27/2024) for REACH clinic, ob visit.   Venora Maples, MD

## 2024-02-13 NOTE — Patient Instructions (Signed)

## 2024-02-13 NOTE — Addendum Note (Signed)
Addended by: Marjo Bicker on: 02/13/2024 05:03 PM   Modules accepted: Orders

## 2024-02-14 ENCOUNTER — Other Ambulatory Visit: Payer: Self-pay | Admitting: *Deleted

## 2024-02-14 DIAGNOSIS — O10912 Unspecified pre-existing hypertension complicating pregnancy, second trimester: Secondary | ICD-10-CM

## 2024-02-14 NOTE — BH Specialist Note (Unsigned)
 Integrated Behavioral Health via Telemedicine Visit  02/22/2024 Paula Dominguez [id number]  Number of Integrated Behavioral Health Clinician visits: 1- Initial Visit  Session Start time: [id number]   Session End time: 0825  Total time in minutes: 9  Referring Provider: Scheryl Darter, MD Patient/Family location: [facility] Provider location: Center for [facility]  All persons participating in visit: [name] and [facility] Tregan Read   Types of Service: Video visit and Introduction only  I connected with Paula Dominguez and/or Paula Dominguez  Dominguez  via  Telephone or Video Enabled Telemedicine Application  (Video is Caregility application) and verified that I am speaking with the correct person using two identifiers. Discussed confidentiality: Yes   Brief interaction with pt, due to unexpected dental appointment after medical approval during pregnancy; pt agrees to attend upcoming appointment on 03/05/24 at 9:45am; agrees to receive Postpartum Planner on After Visit Summary today.   Rae Lips, LCSW     10/06/2023   11:27 AM 05/13/2021   11:42 AM  Depression screen PHQ 2/9  Decreased Interest 3 1  Down, Depressed, Hopeless 3 2  PHQ - 2 Score 6 3  Altered sleeping 3 3  Tired, decreased energy 3 3  Change in appetite 3 3  Feeling bad or failure about yourself  1 2  Trouble concentrating 3 3  Moving slowly or fidgety/restless 0 3  Suicidal thoughts 0 0  PHQ-9 Score 19 20      10/06/2023   11:33 AM 05/13/2021   11:43 AM  GAD 7 : Generalized Anxiety Score  Nervous, Anxious, on Edge 3 2  Control/stop worrying 3 3  Worry too much - different things 3 3  Trouble relaxing 3 3  Restless 3 3  Easily annoyed or irritable 3 3  Afraid - awful might happen 3 2  Total GAD 7 Score 21 19

## 2024-02-15 ENCOUNTER — Ambulatory Visit: Payer: Medicaid Other

## 2024-02-15 ENCOUNTER — Encounter: Payer: Self-pay | Admitting: General Practice

## 2024-02-15 LAB — TOXASSURE FLEX 15, UR
6-ACETYLMORPHINE IA: NEGATIVE ng/mL
7-aminoclonazepam: NOT DETECTED ng/mg{creat}
Alpha-hydroxyalprazolam: NOT DETECTED ng/mg{creat}
Alpha-hydroxymidazolam: NOT DETECTED ng/mg{creat}
Alpha-hydroxytriazolam: NOT DETECTED ng/mg{creat}
Alprazolam: NOT DETECTED ng/mg{creat}
BARBITURATES IA: NEGATIVE ng/mL
BUPRENORPHINE: NEGATIVE
Benzodiazepines: NEGATIVE
Buprenorphine: NOT DETECTED ng/mg{creat}
CANNABINOIDS IA: NEGATIVE ng/mL
COCAINE METABOLITE IA: NEGATIVE ng/mL
Clonazepam: NOT DETECTED ng/mg{creat}
Creatinine: 158 mg/dL
Desalkylflurazepam: NOT DETECTED ng/mg{creat}
Desmethyldiazepam: NOT DETECTED ng/mg{creat}
Desmethylflunitrazepam: NOT DETECTED ng/mg{creat}
Diazepam: NOT DETECTED ng/mg{creat}
ETHYL ALCOHOL Enzymatic: NEGATIVE g/dL
FENTANYL: NEGATIVE
Fentanyl: NOT DETECTED ng/mg{creat}
Flunitrazepam: NOT DETECTED ng/mg{creat}
Lorazepam: NOT DETECTED ng/mg{creat}
METHADONE IA: NEGATIVE ng/mL
METHADONE MTB IA: NEGATIVE ng/mL
Midazolam: NOT DETECTED ng/mg{creat}
Norbuprenorphine: NOT DETECTED ng/mg{creat}
Norfentanyl: NOT DETECTED ng/mg{creat}
OPIATE CLASS IA: NEGATIVE ng/mL
OXYCODONE CLASS IA: NEGATIVE ng/mL
Oxazepam: NOT DETECTED ng/mg{creat}
PHENCYCLIDINE IA: NEGATIVE ng/mL
TAPENTADOL, IA: NEGATIVE ng/mL
TRAMADOL IA: NEGATIVE ng/mL
Temazepam: NOT DETECTED ng/mg{creat}

## 2024-02-15 LAB — AMPHETAMINES, MS, UR RFX
Amphetamine: NOT DETECTED ng/mg{creat}
Amphetamines Confirmation: NEGATIVE
MDA (Ecstasy metabolite): NOT DETECTED ng/mg{creat}
MDMA (Ecstasy): NOT DETECTED ng/mg{creat}
Methamphetamine: NOT DETECTED ng/mg{creat}

## 2024-02-22 ENCOUNTER — Ambulatory Visit: Payer: 59 | Admitting: Clinical

## 2024-02-22 DIAGNOSIS — F4323 Adjustment disorder with mixed anxiety and depressed mood: Secondary | ICD-10-CM

## 2024-02-22 NOTE — Patient Instructions (Signed)
 Center for [facility] at [facility] [address] [place], [place] [address] [phone] (main office) [phone] (Ignacio Lowder's office)     BRAINSTORMING  Develop a Plan Goals: Provide a way to start conversation about your new life with a baby Assist parents in recognizing and using resources within their reach Help pave the way before birth for an easier period of transition afterwards.  Make a list of the following information to keep in a central location: Full name of Mom and Partner: _____________________________________________ Baby's full name and Date of Birth: ___________________________________________ Home Address: ___________________________________________________________ ________________________________________________________________________ Home Phone: ____________________________________________________________ Parents' cell numbers: _____________________________________________________ ________________________________________________________________________ Name and contact info for OB: ______________________________________________ Name and contact info for Pediatrician:________________________________________ Contact info for Lactation Consultants: ________________________________________  REST and SLEEP *You each need at least 4-5 hours of uninterrupted sleep every day. Write specific names and contact information.* How are you going to rest in the postpartum period? While partner's home? When partner returns to work? When you both return to work? Where will your baby sleep? Who is available to help during the day? Evening? Night? Who could move in for a period to help support you? What are some ideas to help you get enough  sleep? __________________________________________________________________________________________________________________________________________________________________________________________________________________________________________ NUTRITIOUS FOOD AND DRINK *Plan for meals before your baby is born so you can have healthy food to eat during the immediate postpartum period.* Who will look after breakfast? Lunch? Dinner? List names and contact information. Brainstorm quick, healthy ideas for each meal. What can you do before baby is born to prepare meals for the postpartum period? How can others help you with meals? Which grocery stores provide online shopping and delivery? Which restaurants offer take-out or delivery options? ______________________________________________________________________________________________________________________________________________________________________________________________________________________________________________________________________________________________________________________________________________________________________________________________________  CARE FOR MOM *It's important that mom is cared for and pampered in the postpartum period. Remember, the most important ways new mothers need care are: sleep, nutrition, gentle exercise, and time off.* Who can come take care of mom during this period? Make a list of people with their contact information. List some activities that make you feel cared for, rested, and energized? Who can make sure you have opportunities to do these things? Does mom have a space of her very own within your home that's just for her? Make a "[facility]" where she can be comfortable, rest, and renew herself  daily. ______________________________________________________________________________________________________________________________________________________________________________________________________________________________________________________________________________________________________________________________________________________________________________________________________    CARE FOR AND FEEDING BABY *Knowledgeable and encouraging people will offer the best support with regard to feeding your baby.* Educate yourself and choose the best feeding option for your baby. Make a list of people who will guide, support, and be a resource for you as your care for and feed your baby. (Friends that have breastfed or are currently breastfeeding, lactation consultants, breastfeeding support groups, etc.) Consider a postpartum doula. (These websites can give you information: dona.org & https://shea.org/) Seek out local breastfeeding resources like the breastfeeding support group at [employer] or [employer]. ______________________________________________________________________________________________________________________________________________________________________________________________________________________________________________________________________________________________________________________________________________________________________________________________________  Judson Roch AND ERRANDS Who can help with a thorough cleaning before baby is born? Make a list of people who will help with housekeeping and chores, like laundry, light cleaning, dishes, bathrooms, etc. Who can run some errands for you? What can you do to make sure you are stocked with basic supplies before baby is born? Who is going to do the  shopping? ______________________________________________________________________________________________________________________________________________________________________________________________________________________________________________________________________________________________________________________________________________________________________________________________________     Family Adjustment *Nurture yourselves.it helps parents be more loving and allows for better bonding with their child.* What sorts of things do you and partner enjoy  doing together? Which activities help you to connect and strengthen your relationship? Make a list of those things. Make a list of people whom you trust to care for your baby so you can have some time together as a couple. What types of things help partner feel connected to Mom? Make a list. What needs will partner have in order to bond with baby? Other children? Who will care for them when you go into labor and while you are in the hospital? Think about what the needs of your older children might be. Who can help you meet those needs? In what ways are you helping them prepare for bringing baby home? List some specific strategies you have for family adjustment. _______________________________________________________________________________________________________________________________________________________________________________________________________________________________________________________________________________________________________________________________________________  SUPPORT *Someone who can empathize with experiences normalizes your problems and makes them more bearable.* Make a list of other friends, neighbors, and/or co-workers you know with infants (and small children, if applicable) with whom you can connect. Make a list of local or online support groups, mom groups, etc. in which you can be  involved. ______________________________________________________________________________________________________________________________________________________________________________________________________________________________________________________________________________________________________________________________________________________________________________________________________  Childcare Plans Investigate and plan for childcare if mom is returning to work. Talk about mom's concerns about her transition back to work. Talk about partner's concerns regarding this transition.  Mental Health *Your mental health is one of the highest priorities for a pregnant or postpartum mom.* 1 in 5 women experience anxiety and/or depression from the time of conception through the first year after birth. Postpartum Mood Disorders are the #1 complication of pregnancy and childbirth and the suffering experienced by these mothers is not necessary! These illnesses are temporary and respond well to treatment, which often includes self-care, social support, talk therapy, and medication when needed. Women experiencing anxiety and depression often say things like: "I'm supposed to be happy.why do I feel so sad?", "Why can't I snap out of it?", "I'm having thoughts that scare me." There is no need to be embarrassed if you are feeling these symptoms: Overwhelmed, anxious, angry, sad, guilty, irritable, hopeless, exhausted but can't sleep You are NOT alone. You are NOT to blame. With help, you WILL be well. Where can I find help? Medical professionals such as your OB, midwife, gynecologist, family practitioner, primary care provider, pediatrician, or mental health providers; [facility] support groups: Feelings After Birth, Breastfeeding Support Group, Baby and Me Group, and Fit 4 Two exercise classes. You have permission to ask for help. It will confirm your feelings, validate your experiences,  share/learn coping strategies, and gain support and encouragement as you heal. You are important! BRAINSTORM Make a list of local resources, including resources for mom and for partner. Identify support groups. Identify people to call late at night - include names and contact info. Talk with partner about perinatal mood and anxiety disorders. Talk with your OB, midwife, and doula about baby blues and about perinatal mood and anxiety disorders. Talk with your pediatrician about perinatal mood and anxiety disorders.   Support & Sanity Savers   What do you really need?  Basics In preparing for a new baby, many expectant parents spend hours shopping for baby clothes, decorating the nursery, and deciding which car seat to buy. Yet most don't think much about what the reality of parenting a newborn will be like, and what they need to make it through that. So, here is the advice of experienced parents. We know you'll read this, and think "they're exaggerating, I don't really need that." Just trust [place] on these, OK? Plan for all of  this, and if it turns out you don't need it, come back and teach [place] how you did it!  Must-Haves (Once baby's survival needs are met, make sure you attend to your own survival needs!) Sleep An average newborn sleeps 16-18 hours per day, over 6-7 sleep periods, rarely more than three hours at a time. It is normal and healthy for a newborn to wake throughout the night... but really hard on parents!! Naps. Prioritize sleep above any responsibilities like: cleaning house, visiting friends, running errands, etc.  Sleep whenever baby sleeps. If you can't nap, at least have restful times when baby eats. The more rest you get, the more patient you will be, the more emotionally stable, and better at solving problems.  Food You may not have realized it would be difficult to eat when you have a newborn. Yet, when we talk to countless new parents, they say things like "it may be 2:00 pm  when I realize I haven't had breakfast yet." Or "every time we sit down to dinner, baby needs to eat, and my food gets cold, so I don't bother to eat it." Finger food. Before your baby is born, stock up with one months' worth of food that: 1) you can eat with one hand while holding a baby, 2) doesn't need to be prepped, 3) is good hot or cold, 4) doesn't spoil when left out for a few hours, and 5) you like to eat. Think about: nuts, dried fruit, Clif bars, pretzels, jerky, gogurt, baby carrots, apples, bananas, crackers, cheez-n-crackers, string cheese, hot pockets or frozen burritos to microwave, garden burgers and breakfast pastries to put in the toaster, yogurt drinks, etc. Restaurant Menus. Make lists of your favorite restaurants & menu items. When family/friends want to help, you can give specific information without much thought. They can either bring you the food or send gift cards for just the right meals. Freezer Meals.  Take some time to make a few meals to put in the freezer ahead of time.  Easy to freeze meals can be anything such as soup, lasagna, chicken pie, or spaghetti sauce. Set up a Meal Schedule.  Ask friends and family to sign up to bring you meals during the first few weeks of being home. (It can be passed around at baby showers!) You have no idea how helpful this will be until you are in the throes of parenting.  [employer].it is a great website to check out. Emotional Support Know who to call when you're stressed out. Parenting a newborn is very challenging work. There are times when it totally overwhelms your normal coping abilities. EVERY NEW PARENT NEEDS TO HAVE A PLAN FOR WHO TO CALL WHEN THEY JUST CAN'T COPE ANY MORE. (And it has to be someone other than the baby's other parent!) Before your baby is born, come up with at least one person you can call for support - write their phone number down and post it on the refrigerator. Anxiety & Sadness. Baby blues are normal after  pregnancy; however, there are more severe types of anxiety & sadness which can occur and should not be ignored.  They are always treatable, but you have to take the first step by reaching out for help. [facility] offers a "Mom Talk" group which meets every Tuesday from 10 am - 11 am.  This group is for new moms who need support and connection after their babies are born.  Call [phone].  Really, Really Helpful (Plan for them!  Make sure these happen often!!) Physical Support with Taking Care of Yourselves Asking friends and family. Before your baby is born, set up a schedule of people who can come and visit and help out (or ask a friend to schedule for you). Any time someone says "let me know what I can do to help," sign them up for a day. When they get there, their job is not to take care of the baby (that's your job and your joy). Their job is to take care of you!  Postpartum doulas. If you don't have anyone you can call on for support, look into postpartum doulas:  professionals at helping parents with caring for baby, caring for themselves, getting breastfeeding started, and helping with household tasks. www.padanc.org is a helpful website for learning about doulas in our area. Peer Support / Parent Groups Why: One of the greatest ideas for new parents is to be around other new parents. Parent groups give you a chance to share and listen to others who are going through the same season of life, get a sense of what is normal infant development by watching several babies learn and grow, share your stories of triumph and struggles with empathetic ears, and forgive your own mistakes when you realize all parents are learning by trial and error. Where to find: There are many places you can meet other new parents throughout our community.  [facility] offers the following classes for new moms and their little ones:  Baby and Me (Birth to Crawling) and Breastfeeding Support Group. Go to  www.conehealthybaby.com or call [phone] for more information. Time for your Relationship It's easy to get so caught up in meeting baby's immediate needs that it's hard to find time to connect with your partner, and meet the needs of your relationship. It's also easy to forget what "quality time with your partner" actually looks like. If you take your baby on a date, you'd be amazed how much of your couple time is spent feeding the baby, diapering the baby, admiring the baby, and talking about the baby. Dating: Try to take time for just the two of you. Babysitter tip: Sometimes when moms are breastfeeding a newborn, they find it hard to figure out how to schedule outings around baby's unpredictable feeding schedules. Have the babysitter come for a three hour period. When she comes over, if baby has just eaten, you can leave right away, and come back in two hours. If baby hasn't fed recently, you start the date at home. Once baby gets hungry and gets a good feeding in, you can head out for the rest of your date time. Date Nights at Home: If you can't get out, at least set aside one evening a week to prioritize your relationship: whenever baby dozes off or doesn't have any immediate needs, spend a little time focusing on each other. Potential conflicts: The main relationship conflicts that come up for new parents are: issues related to sexuality, financial stresses, a feeling of an unfair division of household tasks, and conflicts in parenting styles. The more you can work on these issues before baby arrives, the better!  Fun and Frills (Don't forget these. and don't feel guilty for indulging in them!) Everyone has something in life that is a fun little treat that they do just for themselves. It may be: reading the morning paper, or going for a daily jog, or having coffee with a friend once a week, or going to a movie on Friday nights,  or fine chocolates, or bubble baths, or curling up with a good  book. Unless you do fun things for yourself every now and then, it's hard to have the energy for fun with your baby. Whatever your "special" treats are, make sure you find a way to continue to indulge in them after your baby is born. These special moments can recharge you, and allow you to return to baby with a new joy   PERINATAL MOOD DISORDERS: MATERNAL MENTAL HEALTH FROM CONCEPTION THROUGH THE POSTPARTUM PERIOD   _________________________________________Emergency and Crisis Resources If you are an imminent risk to self or others, are experiencing intense personal distress, and/or have noticed significant changes in activities of daily living, call:  [facility]: [phone]  [address] [place], [place], [address] Mobile Crisis: [phone] National Suicide Hotline: 22 Or visit the following crisis centers: Local Emergency Departments [facility]:  [address], [place]  Mon-Friday 8am-3pm, [phone]                                                                                  ___________ Non-Crisis Resources To identify specific providers that are covered by your insurance, contact your insurance company or local agencies:   Postpartum Support International- Warm-line: 629-532-6019                                                      __Outpatient Therapy and Medication Management   Providers:  [facility]: [phone] Hours: 9AM-5PM  Insurance Accepted: Pilar Jarvis, BCBS, Crista Luria, [place], Medicare  [facility]): [phone] Hours: 8AM-5:30PM  nsurance Accepted: All insurances EXCEPT AARP, [place], [place], and [employer] of the [place]: [phone] Hours: 8AM-8PM Insurance Accepted: Ulla Gallo, Crista Luria, [place], Medicare, [phone] [facility]: [phone] Hours: 8:30AM-7PM Insurance Accepted: Ulla Gallo, Medicaid, Medicare, Tricare, [employer] [facility]:  [phone]   Hours:9AM-5PM Insurance Accepted:  Monia Pouch, Ezequiel Essex, [employer], [place], [employer] [facility]  [facility]: [phone] Hours: 9AM-5:30PM Insurance Accepted: Pilar Jarvis, Teodoro Spray, and Medicaid, Medicare, [facility]:  [phone] Hours: 9am-5pm Insurance Accepted: BCBS; they do not accept Medicaid/Medicare [facility]: [phone] Hours: 9am-9pm Insurance Accepted: All major insurance including Medicaid and Medicare [facility]: [phone] Hours: 9AM- 5PM Insurance Accepted: All insurances EXCEPT Medicaid and Medicare. [facility]: [phone]   ____________                                                                     Parenting Support Groups [facility] at [facility] :  [address], [place], [place], [address] [phone] [facility]:  [phone] Family Support Network: (support for children in the NICU and/or with special needs), [phone]     _____________                                                                                  Online Resources Postpartum Support International: SeekAlumni.co.za  800-944-4PPD Supporting Moms:  www.momssupportingmoms.net

## 2024-02-26 ENCOUNTER — Telehealth: Payer: Self-pay | Admitting: Family Medicine

## 2024-02-26 NOTE — Telephone Encounter (Signed)
 Patient needs letter excusing her from work written on 02/06/24 rewritten before tomorrow. Needs to be more specific, work is requesting that it says she needs a full day off.

## 2024-02-26 NOTE — Telephone Encounter (Signed)
 I re-wrote it and sent a MyChart message but could you follow up with her by phone since it sounds time sensitive.

## 2024-02-26 NOTE — Telephone Encounter (Signed)
 Patient called wanting to speak to Dr.Eckstat she says that we gave the wrong information to her job regarding a letter Dr.Eckstat made for her on 02/06/24. Patient says that we should have told her job that she needs the full day off and not a half a day so that she can come to her appts. I let the patient know that he is not in the office but I can send him a message. Patient says that if this is not done today she will not be able to make her appts tomorrow so she asked which appointment should she not go to because she will not be able to leave work. I told patient it is up to her on what she decides to do. Patient tried arging and said no it was up to my team lead to tell er what to do since my team lead is the one who spoke with her job and gave them the wrong info. I let the patient know that my team lead gave them information directly from the note but the note was not clear and does not specify her needing a full day so I will send the message to Dr.Eckstat requesting a new letter but she may want to come in to her appt tomorrow to talk to him about it. Patient asked for me and my team leads name and said okay.

## 2024-02-27 ENCOUNTER — Other Ambulatory Visit: Payer: Self-pay

## 2024-02-27 ENCOUNTER — Ambulatory Visit (INDEPENDENT_AMBULATORY_CARE_PROVIDER_SITE_OTHER): Payer: Self-pay | Admitting: Family Medicine

## 2024-02-27 ENCOUNTER — Encounter: Payer: Self-pay | Admitting: Family Medicine

## 2024-02-27 VITALS — BP 133/87 | HR 90 | Wt 230.0 lb

## 2024-02-27 DIAGNOSIS — O10913 Unspecified pre-existing hypertension complicating pregnancy, third trimester: Secondary | ICD-10-CM | POA: Diagnosis not present

## 2024-02-27 DIAGNOSIS — O0993 Supervision of high risk pregnancy, unspecified, third trimester: Secondary | ICD-10-CM

## 2024-02-27 DIAGNOSIS — Z641 Problems related to multiparity: Secondary | ICD-10-CM

## 2024-02-27 DIAGNOSIS — G5603 Carpal tunnel syndrome, bilateral upper limbs: Secondary | ICD-10-CM

## 2024-02-27 DIAGNOSIS — O09899 Supervision of other high risk pregnancies, unspecified trimester: Secondary | ICD-10-CM

## 2024-02-27 DIAGNOSIS — O09893 Supervision of other high risk pregnancies, third trimester: Secondary | ICD-10-CM

## 2024-02-27 DIAGNOSIS — Z8759 Personal history of other complications of pregnancy, childbirth and the puerperium: Secondary | ICD-10-CM

## 2024-02-27 DIAGNOSIS — O0990 Supervision of high risk pregnancy, unspecified, unspecified trimester: Secondary | ICD-10-CM

## 2024-02-27 DIAGNOSIS — F321 Major depressive disorder, single episode, moderate: Secondary | ICD-10-CM

## 2024-02-27 DIAGNOSIS — D509 Iron deficiency anemia, unspecified: Secondary | ICD-10-CM

## 2024-02-27 DIAGNOSIS — Z3009 Encounter for other general counseling and advice on contraception: Secondary | ICD-10-CM

## 2024-02-27 DIAGNOSIS — O09523 Supervision of elderly multigravida, third trimester: Secondary | ICD-10-CM

## 2024-02-27 DIAGNOSIS — O10919 Unspecified pre-existing hypertension complicating pregnancy, unspecified trimester: Secondary | ICD-10-CM

## 2024-02-27 DIAGNOSIS — Z3A29 29 weeks gestation of pregnancy: Secondary | ICD-10-CM

## 2024-02-27 NOTE — Patient Instructions (Signed)

## 2024-02-27 NOTE — Progress Notes (Unsigned)
    Subjective:  Paula Dominguez is a 43 y.o. [id number] at [redacted]w[redacted]d being seen today for ongoing prenatal care.  She is currently monitored for the following issues for this high-risk pregnancy and has History of pre-eclampsia; History of postpartum depression; Grand multiparity; Genital warts; Iron deficiency anemia; Migraine headache; Moderate major depression (HCC); AMA (advanced maternal age) multigravida 35+; Chronic hypertension during pregnancy, antepartum; Supervision of high risk pregnancy, antepartum; Bipolar disorder (HCC); Insomnia due to medical condition; Tinea versicolor; Bilateral carpal tunnel syndrome; History of preterm delivery, currently pregnant; Tobacco use disorder; and Unwanted fertility on their problem list.  Patient reports no complaints.  Contractions: Irregular. Vag. Bleeding: None.  Movement: Present. Denies leaking of fluid.   The following portions of the patient's history were reviewed and updated as appropriate: allergies, current medications, past family history, past medical history, past social history, past surgical history and problem list. Problem list updated.  Objective:   Vitals:   02/27/24 1400  BP: 133/87  Pulse: 90  Weight: 230 lb (104.3 kg)    Fetal Status: Fetal Heart Rate (bpm): 145   Movement: Present     General:  Alert, oriented and cooperative. Patient is in no acute distress.  Skin: Skin is warm and dry. No rash noted.   Cardiovascular: Normal heart rate noted  Respiratory: Normal respiratory effort, no problems with respiration noted  Abdomen: Soft, gravid, appropriate for gestational age. Pain/Pressure: Absent     Pelvic: Vag. Bleeding: None     Cervical exam deferred        Extremities: Normal range of motion.  Edema: Deep pitting, indentation remains for a short time  Mental Status: Normal mood and affect. Normal behavior. Normal judgment and thought content.   Urinalysis:      PDMP reviewed during this encounter.  Last  UDS: Lab Results  Component Value Date   PD2 FINAL 02/13/2024   CREATIUR 158 02/13/2024      Assessment and Plan:  Pregnancy: [id number] at [redacted]w[redacted]d  1. Supervision of high risk pregnancy, antepartum (Primary) *** ***compression socks Anxiety, IOL around 37 wks Carpal tunnel L injection at next visit  2. Grand multiparity ***  3. History of pre-eclampsia ***  4. Chronic hypertension during pregnancy, antepartum ***  5. Moderate major depression (HCC) ***  6. Bilateral carpal tunnel syndrome ***  7. Multigravida of advanced maternal age in third trimester ***  8. History of preterm delivery, currently pregnant ***  9. Iron deficiency anemia, unspecified iron deficiency anemia type ***  10. Unwanted fertility ***  {Blank single:19197::"Term","Preterm"} labor symptoms and general obstetric precautions including but not limited to vaginal bleeding, contractions, leaking of fluid and fetal movement were reviewed in detail with the patient. Please refer to After Visit Summary for other counseling recommendations.  Return in 2 weeks (on 03/12/2024) for [facility] clinic, ob visit.   Venora Maples, MD

## 2024-02-29 NOTE — Progress Notes (Signed)
 Carpal Tunnel Injection Procedure Note After obtaining informed verbal and written consent for carpal tunnel injection, proper site was identified on the left wrist 1 cm medial to the palmaris longus tendon and 1 cm proximal to the distal palmar crease. The area was cleaned with alcohol and then anesthetized with hurricane spray. A mixture of 1 cc 6 mg/mL betamethasone (total of 6 mg) and 1 cc of 1% lidocaine was injected by directing the needle towards the base of the thumb at a 45 degree angle at a depth of approximately 1 cm. Mixture flowed easily and patient reported no paresthesias during the procedure. A bandaid was then applied and patient instructed to mobilize wrist for remainder of day to help solution spread. Patient tolerated the procedure well.  1:25 PM 02/29/24   Venora Maples, MD/MPH Attending Family Medicine Physician, [facility] for [facility], [facility]

## 2024-02-29 NOTE — BH Specialist Note (Signed)
 Integrated Behavioral Health via Telemedicine Visit  03/05/2024 Paula Dominguez [id number]  Number of Integrated Behavioral Health Clinician visits: 3- Third Visit  Session Start time: 0950   Session End time: 1042  Total time in minutes: 52   Referring Provider: Merian Capron, MD Patient/Family location: Home [facility] Provider location: Center for [facility] at [facility]  All persons participating in visit: [name] and [facility] Kassidy Dockendorf   Types of Service: Individual psychotherapy and Video visit  I connected with Paula Dominguez and/or Paula Dominguez's  43yo daughter  via  Telephone or Video Enabled Telemedicine Application  (Video is Caregility application) and verified that I am speaking with the correct person using two identifiers. Discussed confidentiality: Yes   I discussed the limitations of telemedicine and the availability of in person appointments.  Discussed there is a possibility of technology failure and discussed alternative modes of communication if that failure occurs.  I discussed that engaging in this telemedicine visit, they consent to the provision of behavioral healthcare and the services will be billed under their insurance.  Patient and/or legal guardian expressed understanding and consented to Telemedicine visit: Yes   Presenting Concerns: Patient and/or family reports the following symptoms/concerns: Noticing feeling "really down" that increases with physical pain; difficulty being "decisive"/lack of focus; increased anxiety. Pt is coping best by implementing positive activities (taking a walk, spend 44-15 minutes in her car alone between work/home break; becoming more mindful; open communication with husband and children,etc.). Pt is open to implementing additional self-coping strategy today.  Duration of problem: Increase current pregnancy; Severity of problem: moderate  Patient  and/or Family's Strengths/Protective Factors: Social connections, Concrete supports in place (healthy food, safe environments, etc.), Sense of purpose, and Physical Health (exercise, healthy diet, medication compliance, etc.)  Goals Addressed: Patient will:  Reduce symptoms of: anxiety, depression, and stress   Increase knowledge and/or ability of: healthy habits and self-management skills   Demonstrate ability to: Increase healthy adjustment to current life circumstances and Increase motivation to adhere to plan of care  Progress towards Goals: Ongoing  Interventions: Interventions utilized:  Mindfulness or Relaxation Training and Supportive Reflection Standardized Assessments completed: Not Needed  Patient and/or Family Response: Patient agrees with treatment plan.   Assessment: Patient currently experiencing History of Bipolar affective disorder  Patient may benefit from psychoeducation and brief therapeutic interventions regarding coping with symptoms of anxiety and depression .  Plan: Follow up with behavioral health clinician on : Two weeks Behavioral recommendations:  -Continue taking prenatal vitamin, Klonopin, and other medications as prescribed -Consider adding iron-rich foods, as part of healthy meals -Continue plan to attend upcoming psychiatry appointment (03/27/24) -Continue using self-coping strategies that have remained helpful (outdoor walks, solitude in car, quality time and communication with spouse and children, etc) -CALM relaxation breathing exercise twice daily (morning; at bedtime with sleep sounds); as needed throughout the day. --Read through information on After Visit Summary; use as needed and discussed  Referral(s): Integrated [employer] (In Clinic)  I discussed the assessment and treatment plan with the patient and/or parent/guardian. They were provided an opportunity to ask questions and all were answered. They agreed with the plan and  demonstrated an understanding of the instructions.   They were advised to call back or seek an in-person evaluation if the symptoms worsen or if the condition fails to improve as anticipated.  Valetta Close Paula Dunn, LCSW     03/01/2024    8:25 AM 10/06/2023  11:27 AM 05/13/2021   11:42 AM  Depression screen PHQ 2/9  Decreased Interest 3 3 1   Down, Depressed, Hopeless 3 3 2   PHQ - 2 Score 6 6 3   Altered sleeping 3 3 3   Tired, decreased energy 3 3 3   Change in appetite 3 3 3   Feeling bad or failure about yourself  2 1 2   Trouble concentrating 3 3 3   Moving slowly or fidgety/restless 2 0 3  Suicidal thoughts 0 0 0  PHQ-9 Score 22 19 20   Difficult doing work/chores Extremely dIfficult        03/01/2024    8:25 AM 10/06/2023   11:33 AM 05/13/2021   11:43 AM  GAD 7 : Generalized Anxiety Score  Nervous, Anxious, on Edge 3 3 2   Control/stop worrying 3 3 3   Worry too much - different things 3 3 3   Trouble relaxing 3 3 3   Restless 3 3 3   Easily annoyed or irritable 3 3 3   Afraid - awful might happen 2 3 2   Total GAD 7 Score 20 21 19   Anxiety Difficulty Extremely difficult

## 2024-03-01 ENCOUNTER — Ambulatory Visit (INDEPENDENT_AMBULATORY_CARE_PROVIDER_SITE_OTHER): Admitting: Clinical

## 2024-03-01 DIAGNOSIS — F331 Major depressive disorder, recurrent, moderate: Secondary | ICD-10-CM | POA: Diagnosis not present

## 2024-03-01 NOTE — Progress Notes (Signed)
 Comprehensive Clinical Assessment (CCA) Note  03/01/2024 Paula Dominguez [id number]  Virtual Visit via Video Note  I connected with Paula Dominguez on 03/01/24 at  8:00 AM EST by a video enabled telemedicine application and verified that I am speaking with the correct person using two identifiers.  Location: Patient: home Provider: office   I discussed the limitations of evaluation and management by telemedicine and the availability of in person appointments. The patient expressed understanding and agreed to proceed.   Follow Up Instructions: I discussed the assessment and treatment plan with the patient. The patient was provided an opportunity to ask questions and all were answered. The patient agreed with the plan and demonstrated an understanding of the instructions.   The patient was advised to call back or seek an in-person evaluation if the symptoms worsen or if the condition fails to improve as anticipated.  I provided 30 minutes of non-face-to-face time during this encounter.   Loree Fee, LCSW   Chief Complaint:  Chief Complaint  Patient presents with   Depression   Anxiety   Visit Diagnosis:  Major depressive disorder, recurrent episode, moderate with anxious distress   Interpretive Summary: Client is a 43 year old female presenting to the [facility] to establish outpatient services.  Client reported she is referred by [facility] by her OB/GYN for a continuous outpatient care related to reoccurring depressive and anxiety symptoms.  Client reported her OB/GYN currently has her managed on Klonopin. Client reported there has been a lot going on in her life that could cause trauma symptoms. client reported she is a mother of 7 living children with one on the way. Client reported she had a baby pass due to SIDS at 25 months old. client reported after grieving in (October 2022). Client reported she tried to  save her baby. client reported prior to that she always had anxiety and panic which it recently got worse. Client reported it was hard for her to work or go on elevators. Client reported no passive S.I but having depressive thoughts. Client reported her emotions would have her feel outside of herself which was scary to her. client reported she will do well and then become pregnant and need to see a doctor for her mental health. Client reported leaving the house is too much. Client reported chronic fatigue, high blood pressure, confusion,insomnia and crying spells. Client reported one hospitalization at the age of 63 due to suicidal ideations. Client reported no substance use history. Client presented oriented times five, appropriately dressed and friendly. Client denied hallucinations, delusions, suicidal and homicidal ideations. Client was screened for pain, nutrition, [place] suicide severity and the following SDOH:    03/01/2024    8:25 AM 10/06/2023   11:33 AM 05/13/2021   11:43 AM  GAD 7 : Generalized Anxiety Score  Nervous, Anxious, on Edge 3 3 2   Control/stop worrying 3 3 3   Worry too much - different things 3 3 3   Trouble relaxing 3 3 3   Restless 3 3 3   Easily annoyed or irritable 3 3 3   Afraid - awful might happen 2 3 2   Total GAD 7 Score 20 21 19   Anxiety Difficulty Extremely difficult     Flowsheet Row Counselor from 03/01/2024 in [facility]  PHQ-9 Total Score 22        Treatment Recommendations: counseling and medications management    CCA Biopsychosocial Intake/Chief Complaint:  client reported she is referred by  her OBGYN in [facility] system. client reported she is presenting due to symptoms of anxiety and depression prior to and during her pregnancy.  Current Symptoms/Problems: symptoms would impact her blood pressure, cause confusion, fatigue, unable to critically think, feelings of inadequacy, insomnia  Patient Reported  Schizophrenia/Schizoaffective Diagnosis in Past: No  Strengths: engage with outpatient services  Preferences: counseling and medication management  Abilities: discuss history and symptoms  Type of Services Patient Feels are Needed: psychiatry and therapy  Initial Clinical Notes/Concerns: No data recorded  Mental Health Symptoms Depression:  Change in energy/activity; Difficulty Concentrating; Sleep (too much or little); Tearfulness   Duration of Depressive symptoms: Greater than two weeks   Mania:  None   Anxiety:   Difficulty concentrating; Sleep; Tension; Worrying   Psychosis:  None   Duration of Psychotic symptoms: No data recorded  Trauma:  None   Obsessions:  None   Compulsions:  None   Inattention:  N/A   Hyperactivity/Impulsivity:  None   Oppositional/Defiant Behaviors:  None   Emotional Irregularity:  None   Other Mood/Personality Symptoms:  No data recorded   Mental Status Exam Appearance and self-care  Stature:  Average   Weight:  Average weight   Clothing:  Casual   Grooming:  Normal   Cosmetic use:  Age appropriate   Posture/gait:  Normal   Motor activity:  Not Remarkable   Sensorium  Attention:  Normal   Concentration:  Normal   Orientation:  X5   Recall/memory:  Normal   Affect and Mood  Affect:  Congruent   Mood:  Euthymic   Relating  Eye contact:  Normal   Facial expression:  Responsive   Attitude toward examiner:  Cooperative   Thought and Language  Speech flow: Clear and Coherent   Thought content:  Appropriate to Mood and Circumstances   Preoccupation:  None   Hallucinations:  None   Organization:  No data recorded  [employer] of Knowledge:  Good   Intelligence:  Average   Abstraction:  Normal   Judgement:  Good   Reality Testing:  Adequate   Insight:  Good   Decision Making:  Normal   Social Functioning  Social Maturity:  Responsible   Social Judgement:  Normal   Stress   Stressors:  Transitions   Coping Ability:  Normal   Skill Deficits:  Activities of daily living; Self-care   Supports:  Family     Religion: Religion/Spirituality Are You A Religious Person?: Yes  Leisure/Recreation: Leisure / Recreation Do You Have Hobbies?: No  Exercise/Diet: Exercise/Diet Do You Exercise?: No Have You Gained or Lost A Significant Amount of Weight in the Past Six Months?: No Do You Follow a Special Diet?: No Do You Have Any Trouble Sleeping?: Yes Explanation of Sleeping Difficulties: insomnia.   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 client reported having some abandment issues from her parents. client reported her bioloigcal parents were younger having her. Patient's description of current relationship  with people who raised him/her: client reported she has a relationship with her mother which she forced but feels slightly uncomfortable. client reported struggling with how her parents did not want a active roll in raising her. Does patient have siblings?: No Did patient suffer any verbal/emotional/physical/sexual abuse as a child?: No Did patient suffer from severe childhood neglect?: No Has patient ever been sexually abused/assaulted/raped as an adolescent or adult?: No Was the patient ever a victim of a crime or a disaster?:  No Witnessed domestic violence?: No Has patient been affected by domestic violence as an adult?: No  Child/Adolescent Assessment:     CCA Substance Use Alcohol/Drug Use: Alcohol / Drug Use History of alcohol / drug use?: No history of alcohol / drug abuse                         ASAM's:  Six Dimensions of Multidimensional Assessment  Dimension 1:  Acute Intoxication and/or Withdrawal Potential:      Dimension 2:  Biomedical Conditions and Complications:      Dimension 3:  Emotional, Behavioral, or Cognitive Conditions and Complications:     Dimension 4:  Readiness to Change:     Dimension 5:  Relapse, Continued use, or Continued Problem Potential:     Dimension 6:  Recovery/Living Environment:     ASAM Severity Score:    ASAM Recommended Level of Treatment:     Substance use Disorder (SUD)    Recommendations for Services/Supports/Treatments: Recommendations for Services/Supports/Treatments Recommendations For Services/Supports/Treatments: Individual Therapy, Medication Management  DSM5 Diagnoses: Patient Active Problem List   Diagnosis Date Noted   Unwanted fertility 02/27/2024   Tobacco use disorder 02/13/2024   History of preterm delivery, currently pregnant 01/30/2024   Bilateral carpal tunnel syndrome 01/11/2024   Tinea versicolor 12/12/2023   Supervision of high risk pregnancy, antepartum 10/06/2023   Chronic hypertension during pregnancy, antepartum 08/13/2021   AMA (advanced maternal age) multigravida 35+ 07/07/2021   Insomnia due to medical condition 01/08/2019   Grand multiparity 07/11/2018   Iron deficiency anemia 01/08/2018   Genital warts 01/12/2016   Migraine headache 01/12/2016   Moderate major depression (HCC) 01/12/2016   Bipolar disorder (HCC) 01/12/2016   History of postpartum depression 01/09/2014   History of pre-eclampsia 11/19/2013    Patient Centered Plan: Patient is on the following Treatment Plan(s):   Depression   Referrals to Alternative Service(s): Referred to Alternative Service(s):   Place:   Date:   Time:    Referred to Alternative Service(s):   Place:   Date:   Time:    Referred to Alternative Service(s):   Place:   Date:   Time:    Referred to Alternative Service(s):   Place:   Date:   Time:      Collaboration of Care: Referral or follow-up with counselor/therapist [facility]  Patient/Guardian was advised Release of Information must be obtained prior to any record release in order to collaborate their care with an outside provider. Patient/Guardian was advised if they have not already done so to contact the registration department to sign all necessary forms in order for [place] to release information regarding their care.   Consent: Patient/Guardian gives verbal consent for treatment and assignment of benefits for services provided during this visit. Patient/Guardian expressed understanding and agreed to proceed.   Neena Rhymes Psalms [place], LCSW

## 2024-03-05 ENCOUNTER — Ambulatory Visit (INDEPENDENT_AMBULATORY_CARE_PROVIDER_SITE_OTHER): Payer: 59 | Admitting: Clinical

## 2024-03-05 DIAGNOSIS — F319 Bipolar disorder, unspecified: Secondary | ICD-10-CM

## 2024-03-05 NOTE — Patient Instructions (Signed)
 Center for [facility] at [facility] [address] [place], [place] [address] [phone] (main office) [phone] (Ignacio Lowder's office)     BRAINSTORMING  Develop a Plan Goals: Provide a way to start conversation about your new life with a baby Assist parents in recognizing and using resources within their reach Help pave the way before birth for an easier period of transition afterwards.  Make a list of the following information to keep in a central location: Full name of Mom and Partner: _____________________________________________ Baby's full name and Date of Birth: ___________________________________________ Home Address: ___________________________________________________________ ________________________________________________________________________ Home Phone: ____________________________________________________________ Parents' cell numbers: _____________________________________________________ ________________________________________________________________________ Name and contact info for OB: ______________________________________________ Name and contact info for Pediatrician:________________________________________ Contact info for Lactation Consultants: ________________________________________  REST and SLEEP *You each need at least 4-5 hours of uninterrupted sleep every day. Write specific names and contact information.* How are you going to rest in the postpartum period? While partner's home? When partner returns to work? When you both return to work? Where will your baby sleep? Who is available to help during the day? Evening? Night? Who could move in for a period to help support you? What are some ideas to help you get enough  sleep? __________________________________________________________________________________________________________________________________________________________________________________________________________________________________________ NUTRITIOUS FOOD AND DRINK *Plan for meals before your baby is born so you can have healthy food to eat during the immediate postpartum period.* Who will look after breakfast? Lunch? Dinner? List names and contact information. Brainstorm quick, healthy ideas for each meal. What can you do before baby is born to prepare meals for the postpartum period? How can others help you with meals? Which grocery stores provide online shopping and delivery? Which restaurants offer take-out or delivery options? ______________________________________________________________________________________________________________________________________________________________________________________________________________________________________________________________________________________________________________________________________________________________________________________________________  CARE FOR MOM *It's important that mom is cared for and pampered in the postpartum period. Remember, the most important ways new mothers need care are: sleep, nutrition, gentle exercise, and time off.* Who can come take care of mom during this period? Make a list of people with their contact information. List some activities that make you feel cared for, rested, and energized? Who can make sure you have opportunities to do these things? Does mom have a space of her very own within your home that's just for her? Make a "[facility]" where she can be comfortable, rest, and renew herself  daily. ______________________________________________________________________________________________________________________________________________________________________________________________________________________________________________________________________________________________________________________________________________________________________________________________________    CARE FOR AND FEEDING BABY *Knowledgeable and encouraging people will offer the best support with regard to feeding your baby.* Educate yourself and choose the best feeding option for your baby. Make a list of people who will guide, support, and be a resource for you as your care for and feed your baby. (Friends that have breastfed or are currently breastfeeding, lactation consultants, breastfeeding support groups, etc.) Consider a postpartum doula. (These websites can give you information: dona.org & https://shea.org/) Seek out local breastfeeding resources like the breastfeeding support group at [employer] or [employer]. ______________________________________________________________________________________________________________________________________________________________________________________________________________________________________________________________________________________________________________________________________________________________________________________________________  Judson Roch AND ERRANDS Who can help with a thorough cleaning before baby is born? Make a list of people who will help with housekeeping and chores, like laundry, light cleaning, dishes, bathrooms, etc. Who can run some errands for you? What can you do to make sure you are stocked with basic supplies before baby is born? Who is going to do the  shopping? ______________________________________________________________________________________________________________________________________________________________________________________________________________________________________________________________________________________________________________________________________________________________________________________________________     Family Adjustment *Nurture yourselves.it helps parents be more loving and allows for better bonding with their child.* What sorts of things do you and partner enjoy  doing together? Which activities help you to connect and strengthen your relationship? Make a list of those things. Make a list of people whom you trust to care for your baby so you can have some time together as a couple. What types of things help partner feel connected to Mom? Make a list. What needs will partner have in order to bond with baby? Other children? Who will care for them when you go into labor and while you are in the hospital? Think about what the needs of your older children might be. Who can help you meet those needs? In what ways are you helping them prepare for bringing baby home? List some specific strategies you have for family adjustment. _______________________________________________________________________________________________________________________________________________________________________________________________________________________________________________________________________________________________________________________________________________  SUPPORT *Someone who can empathize with experiences normalizes your problems and makes them more bearable.* Make a list of other friends, neighbors, and/or co-workers you know with infants (and small children, if applicable) with whom you can connect. Make a list of local or online support groups, mom groups, etc. in which you can be  involved. ______________________________________________________________________________________________________________________________________________________________________________________________________________________________________________________________________________________________________________________________________________________________________________________________________  Childcare Plans Investigate and plan for childcare if mom is returning to work. Talk about mom's concerns about her transition back to work. Talk about partner's concerns regarding this transition.  Mental Health *Your mental health is one of the highest priorities for a pregnant or postpartum mom.* 1 in 5 women experience anxiety and/or depression from the time of conception through the first year after birth. Postpartum Mood Disorders are the #1 complication of pregnancy and childbirth and the suffering experienced by these mothers is not necessary! These illnesses are temporary and respond well to treatment, which often includes self-care, social support, talk therapy, and medication when needed. Women experiencing anxiety and depression often say things like: "I'm supposed to be happy.why do I feel so sad?", "Why can't I snap out of it?", "I'm having thoughts that scare me." There is no need to be embarrassed if you are feeling these symptoms: Overwhelmed, anxious, angry, sad, guilty, irritable, hopeless, exhausted but can't sleep You are NOT alone. You are NOT to blame. With help, you WILL be well. Where can I find help? Medical professionals such as your OB, midwife, gynecologist, family practitioner, primary care provider, pediatrician, or mental health providers; [facility] support groups: Feelings After Birth, Breastfeeding Support Group, Baby and Me Group, and Fit 4 Two exercise classes. You have permission to ask for help. It will confirm your feelings, validate your experiences,  share/learn coping strategies, and gain support and encouragement as you heal. You are important! BRAINSTORM Make a list of local resources, including resources for mom and for partner. Identify support groups. Identify people to call late at night - include names and contact info. Talk with partner about perinatal mood and anxiety disorders. Talk with your OB, midwife, and doula about baby blues and about perinatal mood and anxiety disorders. Talk with your pediatrician about perinatal mood and anxiety disorders.   Support & Sanity Savers   What do you really need?  Basics In preparing for a new baby, many expectant parents spend hours shopping for baby clothes, decorating the nursery, and deciding which car seat to buy. Yet most don't think much about what the reality of parenting a newborn will be like, and what they need to make it through that. So, here is the advice of experienced parents. We know you'll read this, and think "they're exaggerating, I don't really need that." Just trust [place] on these, OK? Plan for all of  this, and if it turns out you don't need it, come back and teach [place] how you did it!  Must-Haves (Once baby's survival needs are met, make sure you attend to your own survival needs!) Sleep An average newborn sleeps 16-18 hours per day, over 6-7 sleep periods, rarely more than three hours at a time. It is normal and healthy for a newborn to wake throughout the night... but really hard on parents!! Naps. Prioritize sleep above any responsibilities like: cleaning house, visiting friends, running errands, etc.  Sleep whenever baby sleeps. If you can't nap, at least have restful times when baby eats. The more rest you get, the more patient you will be, the more emotionally stable, and better at solving problems.  Food You may not have realized it would be difficult to eat when you have a newborn. Yet, when we talk to countless new parents, they say things like "it may be 2:00 pm  when I realize I haven't had breakfast yet." Or "every time we sit down to dinner, baby needs to eat, and my food gets cold, so I don't bother to eat it." Finger food. Before your baby is born, stock up with one months' worth of food that: 1) you can eat with one hand while holding a baby, 2) doesn't need to be prepped, 3) is good hot or cold, 4) doesn't spoil when left out for a few hours, and 5) you like to eat. Think about: nuts, dried fruit, Clif bars, pretzels, jerky, gogurt, baby carrots, apples, bananas, crackers, cheez-n-crackers, string cheese, hot pockets or frozen burritos to microwave, garden burgers and breakfast pastries to put in the toaster, yogurt drinks, etc. Restaurant Menus. Make lists of your favorite restaurants & menu items. When family/friends want to help, you can give specific information without much thought. They can either bring you the food or send gift cards for just the right meals. Freezer Meals.  Take some time to make a few meals to put in the freezer ahead of time.  Easy to freeze meals can be anything such as soup, lasagna, chicken pie, or spaghetti sauce. Set up a Meal Schedule.  Ask friends and family to sign up to bring you meals during the first few weeks of being home. (It can be passed around at baby showers!) You have no idea how helpful this will be until you are in the throes of parenting.  [employer].it is a great website to check out. Emotional Support Know who to call when you're stressed out. Parenting a newborn is very challenging work. There are times when it totally overwhelms your normal coping abilities. EVERY NEW PARENT NEEDS TO HAVE A PLAN FOR WHO TO CALL WHEN THEY JUST CAN'T COPE ANY MORE. (And it has to be someone other than the baby's other parent!) Before your baby is born, come up with at least one person you can call for support - write their phone number down and post it on the refrigerator. Anxiety & Sadness. Baby blues are normal after  pregnancy; however, there are more severe types of anxiety & sadness which can occur and should not be ignored.  They are always treatable, but you have to take the first step by reaching out for help. [facility] offers a "Mom Talk" group which meets every Tuesday from 10 am - 11 am.  This group is for new moms who need support and connection after their babies are born.  Call [phone].  Really, Really Helpful (Plan for them!  Make sure these happen often!!) Physical Support with Taking Care of Yourselves Asking friends and family. Before your baby is born, set up a schedule of people who can come and visit and help out (or ask a friend to schedule for you). Any time someone says "let me know what I can do to help," sign them up for a day. When they get there, their job is not to take care of the baby (that's your job and your joy). Their job is to take care of you!  Postpartum doulas. If you don't have anyone you can call on for support, look into postpartum doulas:  professionals at helping parents with caring for baby, caring for themselves, getting breastfeeding started, and helping with household tasks. www.padanc.org is a helpful website for learning about doulas in our area. Peer Support / Parent Groups Why: One of the greatest ideas for new parents is to be around other new parents. Parent groups give you a chance to share and listen to others who are going through the same season of life, get a sense of what is normal infant development by watching several babies learn and grow, share your stories of triumph and struggles with empathetic ears, and forgive your own mistakes when you realize all parents are learning by trial and error. Where to find: There are many places you can meet other new parents throughout our community.  [facility] offers the following classes for new moms and their little ones:  Baby and Me (Birth to Crawling) and Breastfeeding Support Group. Go to  www.conehealthybaby.com or call [phone] for more information. Time for your Relationship It's easy to get so caught up in meeting baby's immediate needs that it's hard to find time to connect with your partner, and meet the needs of your relationship. It's also easy to forget what "quality time with your partner" actually looks like. If you take your baby on a date, you'd be amazed how much of your couple time is spent feeding the baby, diapering the baby, admiring the baby, and talking about the baby. Dating: Try to take time for just the two of you. Babysitter tip: Sometimes when moms are breastfeeding a newborn, they find it hard to figure out how to schedule outings around baby's unpredictable feeding schedules. Have the babysitter come for a three hour period. When she comes over, if baby has just eaten, you can leave right away, and come back in two hours. If baby hasn't fed recently, you start the date at home. Once baby gets hungry and gets a good feeding in, you can head out for the rest of your date time. Date Nights at Home: If you can't get out, at least set aside one evening a week to prioritize your relationship: whenever baby dozes off or doesn't have any immediate needs, spend a little time focusing on each other. Potential conflicts: The main relationship conflicts that come up for new parents are: issues related to sexuality, financial stresses, a feeling of an unfair division of household tasks, and conflicts in parenting styles. The more you can work on these issues before baby arrives, the better!  Fun and Frills (Don't forget these. and don't feel guilty for indulging in them!) Everyone has something in life that is a fun little treat that they do just for themselves. It may be: reading the morning paper, or going for a daily jog, or having coffee with a friend once a week, or going to a movie on Friday nights,  or fine chocolates, or bubble baths, or curling up with a good  book. Unless you do fun things for yourself every now and then, it's hard to have the energy for fun with your baby. Whatever your "special" treats are, make sure you find a way to continue to indulge in them after your baby is born. These special moments can recharge you, and allow you to return to baby with a new joy   PERINATAL MOOD DISORDERS: MATERNAL MENTAL HEALTH FROM CONCEPTION THROUGH THE POSTPARTUM PERIOD   _________________________________________Emergency and Crisis Resources If you are an imminent risk to self or others, are experiencing intense personal distress, and/or have noticed significant changes in activities of daily living, call:  [facility]: [phone]  [address] [place], [place], [address] Mobile Crisis: [phone] National Suicide Hotline: 22 Or visit the following crisis centers: Local Emergency Departments [facility]:  [address], [place]  Mon-Friday 8am-3pm, [phone]                                                                                  ___________ Non-Crisis Resources To identify specific providers that are covered by your insurance, contact your insurance company or local agencies:   Postpartum Support International- Warm-line: 629-532-6019                                                      __Outpatient Therapy and Medication Management   Providers:  [facility]: [phone] Hours: 9AM-5PM  Insurance Accepted: Pilar Jarvis, BCBS, Crista Luria, [place], Medicare  [facility]): [phone] Hours: 8AM-5:30PM  nsurance Accepted: All insurances EXCEPT AARP, [place], [place], and [employer] of the [place]: [phone] Hours: 8AM-8PM Insurance Accepted: Ulla Gallo, Crista Luria, [place], Medicare, [phone] [facility]: [phone] Hours: 8:30AM-7PM Insurance Accepted: Ulla Gallo, Medicaid, Medicare, Tricare, [employer] [facility]:  [phone]   Hours:9AM-5PM Insurance Accepted:  Monia Pouch, Ezequiel Essex, [employer], [place], [employer] [facility]  [facility]: [phone] Hours: 9AM-5:30PM Insurance Accepted: Pilar Jarvis, Teodoro Spray, and Medicaid, Medicare, [facility]:  [phone] Hours: 9am-5pm Insurance Accepted: BCBS; they do not accept Medicaid/Medicare [facility]: [phone] Hours: 9am-9pm Insurance Accepted: All major insurance including Medicaid and Medicare [facility]: [phone] Hours: 9AM- 5PM Insurance Accepted: All insurances EXCEPT Medicaid and Medicare. [facility]: [phone]   ____________                                                                     Parenting Support Groups [facility] at [facility] :  [address], [place], [place], [address] [phone] [facility]:  [phone] Family Support Network: (support for children in the NICU and/or with special needs), [phone]     _____________                                                                                  Online Resources Postpartum Support International: SeekAlumni.co.za  800-944-4PPD Supporting Moms:  www.momssupportingmoms.net

## 2024-03-07 NOTE — BH Specialist Note (Deleted)
 Integrated Behavioral Health Follow Up In-Person Visit  MRN: [id number] Name: Paula Dominguez  Number of Integrated Behavioral Health Clinician visits: 3- Third Visit  Session Start time: [phone]   Session End time: 1042  Total time in minutes: 52   Types of Service: Individual psychotherapy  Interpretor:No. Interpretor Name and Language: n/a  Subjective: Paula Dominguez is a 43 y.o. female accompanied by {Patient accompanied by:[phone]} Patient was referred by Merian Capron, MD for ***. Patient reports the following symptoms/concerns: *** Duration of problem: ***; Severity of problem: {Mild/Moderate/Severe:20260}  Objective: Mood: {BHH MOOD:22306} and Affect: {BHH AFFECT:22307} Risk of harm to self or others: {CHL AMB BH Suicide Current Mental Status:21022748}  Life Context: Family and Social: *** School/Work: *** Self-Care: *** Life Changes: Current pregnancy ***  Patient and/or Family's Strengths/Protective Factors: {CHL AMB BH PROTECTIVE FACTORS:[phone]}  Goals Addressed: Patient will:  Reduce symptoms of: {IBH Symptoms:21014056}   Increase knowledge and/or ability of: {IBH Patient Tools:21014057}   Demonstrate ability to: {IBH Goals:21014053}  Progress towards Goals: {CHL AMB BH PROGRESS TOWARDS GOALS:[phone]}  Interventions: Interventions utilized:  {IBH Interventions:21014054} Standardized Assessments completed: {IBH Screening Tools:21014051}  Patient and/or Family Response: Patient agrees with treatment plan. ***  Patient Centered Plan: Patient is on the following Treatment Plan(s): IBH Assessment: Patient currently experiencing ***.   Patient may benefit from continued therapeutic intervention *** .  Plan: Follow up with behavioral health clinician on : *** Behavioral recommendations:  -*** -*** Referral(s): {IBH Referrals:21014055}  Rae Lips, LCSW     03/01/2024    8:25 AM 10/06/2023   11:27 AM 05/13/2021    11:42 AM  Depression screen PHQ 2/9  Decreased Interest  3 1  Down, Depressed, Hopeless  3 2  PHQ - 2 Score  6 3  Altered sleeping  3 3  Tired, decreased energy  3 3  Change in appetite  3 3  Feeling bad or failure about yourself   1 2  Trouble concentrating  3 3  Moving slowly or fidgety/restless  0 3  Suicidal thoughts  0 0  PHQ-9 Score  19 20  Difficult doing work/chores        Information is confidential and restricted. Go to Review Flowsheets to unlock data.      03/01/2024    8:25 AM 10/06/2023   11:33 AM 05/13/2021   11:43 AM  GAD 7 : Generalized Anxiety Score  Nervous, Anxious, on Edge  3 2  Control/stop worrying  3 3  Worry too much - different things  3 3  Trouble relaxing  3 3  Restless  3 3  Easily annoyed or irritable  3 3  Afraid - awful might happen  3 2  Total GAD 7 Score  21 19  Anxiety Difficulty        Information is confidential and restricted. Go to Review Flowsheets to unlock data.

## 2024-03-12 ENCOUNTER — Other Ambulatory Visit: Payer: Self-pay

## 2024-03-12 ENCOUNTER — Other Ambulatory Visit (HOSPITAL_COMMUNITY)
Admission: RE | Admit: 2024-03-12 | Discharge: 2024-03-12 | Disposition: A | Discharge status: Home / Self Care | Source: Ambulatory Visit | Attending: Family Medicine | Admitting: Family Medicine

## 2024-03-12 ENCOUNTER — Ambulatory Visit: Admitting: Family Medicine

## 2024-03-12 VITALS — BP 125/83 | HR 90 | Wt 235.2 lb

## 2024-03-12 DIAGNOSIS — F17200 Nicotine dependence, unspecified, uncomplicated: Secondary | ICD-10-CM

## 2024-03-12 DIAGNOSIS — O09523 Supervision of elderly multigravida, third trimester: Secondary | ICD-10-CM

## 2024-03-12 DIAGNOSIS — Z3009 Encounter for other general counseling and advice on contraception: Secondary | ICD-10-CM

## 2024-03-12 DIAGNOSIS — N898 Other specified noninflammatory disorders of vagina: Secondary | ICD-10-CM | POA: Insufficient documentation

## 2024-03-12 DIAGNOSIS — O0993 Supervision of high risk pregnancy, unspecified, third trimester: Secondary | ICD-10-CM

## 2024-03-12 DIAGNOSIS — O0990 Supervision of high risk pregnancy, unspecified, unspecified trimester: Secondary | ICD-10-CM

## 2024-03-12 DIAGNOSIS — Z3A31 31 weeks gestation of pregnancy: Secondary | ICD-10-CM

## 2024-03-12 DIAGNOSIS — F321 Major depressive disorder, single episode, moderate: Secondary | ICD-10-CM

## 2024-03-12 DIAGNOSIS — O10913 Unspecified pre-existing hypertension complicating pregnancy, third trimester: Secondary | ICD-10-CM

## 2024-03-12 DIAGNOSIS — O26899 Other specified pregnancy related conditions, unspecified trimester: Secondary | ICD-10-CM | POA: Insufficient documentation

## 2024-03-12 DIAGNOSIS — Z641 Problems related to multiparity: Secondary | ICD-10-CM

## 2024-03-12 DIAGNOSIS — O26893 Other specified pregnancy related conditions, third trimester: Secondary | ICD-10-CM

## 2024-03-12 DIAGNOSIS — G43809 Other migraine, not intractable, without status migrainosus: Secondary | ICD-10-CM

## 2024-03-12 DIAGNOSIS — O10919 Unspecified pre-existing hypertension complicating pregnancy, unspecified trimester: Secondary | ICD-10-CM

## 2024-03-12 DIAGNOSIS — R102 Pelvic and perineal pain: Secondary | ICD-10-CM

## 2024-03-12 NOTE — Progress Notes (Signed)
 PRENATAL VISIT NOTE  Subjective:  Paula Dominguez is a 43 y.o. [id number] at [redacted]w[redacted]d being seen today for ongoing prenatal care.  She is currently monitored for the following issues for this high-risk pregnancy and has History of pre-eclampsia; History of postpartum depression; Grand multiparity; Genital warts; Iron deficiency anemia; Migraine headache; Moderate major depression (HCC); AMA (advanced maternal age) multigravida 35+; Chronic hypertension during pregnancy, antepartum; Supervision of high risk pregnancy, antepartum; Bipolar disorder (HCC); Insomnia due to medical condition; Tinea versicolor; Bilateral carpal tunnel syndrome; History of preterm delivery, currently pregnant; Tobacco use disorder; and Unwanted fertility on their problem list.  Patient reports  pelvis pressure, vaginal odor, high BP this morning 200/100's . Took BP meds. Reports intermittent HA's C/W her chronic HA's. Has been taking Excedrin Migraine w/out improvement. Requesting Fioricet since it had worked in the past. Does not see anyone for migraines. Denies vision changes, epigastric pain, difficulties with speech or gait.  Contractions: Irregular. Vag. Bleeding: None.  Movement: Present. Denies leaking of fluid.   The following portions of the patient's history were reviewed and updated as appropriate: allergies, current medications, past family history, past medical history, past social history, past surgical history and problem list.   Objective:   Vitals:   03/12/24 1353  BP: 125/83  Pulse: 90  Weight: 235 lb 3.2 oz (106.7 kg)    Fetal Status: Fetal Heart Rate (bpm): 140   Movement: Present     General:  Alert, oriented and cooperative. Patient is in no acute distress.  Skin: Skin is warm and dry. No rash noted.   Cardiovascular: Normal heart rate noted  Respiratory: Normal respiratory effort, no problems with respiration noted  Abdomen: Soft, gravid, appropriate for gestational age.  Pain/Pressure:  Present     Pelvic: Cervical exam performed in the presence of a chaperone Dilation: Closed Effacement (%): 0 Station: Ballotable  Extremities: Normal range of motion.  Edema: Deep pitting, indentation remains for a short time  Mental Status: Normal mood and affect. Normal behavior. Normal judgment and thought content.   Assessment and Plan:  Pregnancy: [id number] at [redacted]w[redacted]d 1. Vaginal odor (Primary) - Cervicovaginal ancillary only( [place])  2. Grand multiparity  3. Multigravida of advanced maternal age in third trimester - Antenatal testing per MFM  4. Supervision of high risk pregnancy, antepartum  5. [redacted] weeks gestation of pregnancy  6. Pelvic pressure in pregnancy - Cervicovaginal ancillary only( [place]) - UA Nml - PTL precautions  7. Moderate major depression (HCC) - Followed by [facility]  8. Tobacco use disorder  9. Other migraine without status migrainosus, not intractable - Referral to Nada Maclachlan - Rx Fioricet - Pre-E precautions  10. Unwanted fertility - R/B/I/A bilateral tubal sterilization discussed. Pt has some concern about what she has heard about menstrual changes after BTS. Is very sure that she is finished having babies. Discussed IUD, Nexplanon, vasectomy as highly effective alternative. Consent signed.   11. Chronic HTN - BP nml in office. Intermittent HA's same as chronic migraines. None now. Pre-E precautions.   Preterm labor symptoms and general obstetric precautions including but not limited to vaginal bleeding, contractions, leaking of fluid and fetal movement were reviewed in detail with the patient. Please refer to After Visit Summary for other counseling recommendations.   Return in about 2 weeks (around 03/26/2024) for REACH, Needs contact info for Nada Maclachlan at [facility] for HA treatment.  Future Appointments  Date Time Provider Department Center  03/14/2024  9:15 AM [facility]  NURSE [facility]  03/14/2024  9:30 AM [facility] US1  [facility]  03/19/2024  1:30 PM [facility] US4 [facility]  03/19/2024  2:15 PM [facility]  03/26/2024  1:30 PM [facility] US2 [facility]  03/27/2024 11:00 AM Nwoko, Uchenna E, PA [facility] None  04/02/2024  1:30 PM [facility] US2 [facility]  04/09/2024  1:30 PM [facility] US2 [facility]  04/18/2024  8:00 AM Cozart, Paige Y, LCSW [facility] None  05/09/2024  8:00 AM Cozart, Neena Rhymes, LCSW [facility] None  05/30/2024  8:00 AM Cozart, Neena Rhymes, LCSW [facility] None    [place], [place]

## 2024-03-13 LAB — POCT URINALYSIS DIP (DEVICE)
Bilirubin Urine: NEGATIVE
Glucose, UA: NEGATIVE mg/dL
Hgb urine dipstick: NEGATIVE
Ketones, ur: NEGATIVE mg/dL
Leukocytes,Ua: NEGATIVE
Nitrite: NEGATIVE
Protein, ur: NEGATIVE mg/dL
Specific Gravity, Urine: >=1.03 (ref 1.005–1.030)
Urobilinogen, UA: 0.2 mg/dL (ref 0.0–1.0)
pH: 6.5 (ref 5.0–8.0)

## 2024-03-14 ENCOUNTER — Encounter (HOSPITAL_COMMUNITY): Payer: Self-pay | Admitting: Family Medicine

## 2024-03-14 ENCOUNTER — Ambulatory Visit: Attending: Maternal & Fetal Medicine | Admitting: Maternal & Fetal Medicine

## 2024-03-14 ENCOUNTER — Other Ambulatory Visit: Payer: Self-pay

## 2024-03-14 ENCOUNTER — Ambulatory Visit: Payer: Medicaid Other | Attending: Maternal & Fetal Medicine | Admitting: *Deleted

## 2024-03-14 ENCOUNTER — Ambulatory Visit (HOSPITAL_BASED_OUTPATIENT_CLINIC_OR_DEPARTMENT_OTHER): Payer: Medicaid Other

## 2024-03-14 ENCOUNTER — Encounter: Payer: Self-pay | Admitting: Advanced Practice Midwife

## 2024-03-14 ENCOUNTER — Inpatient Hospital Stay (HOSPITAL_COMMUNITY)
Admission: AD | Admit: 2024-03-14 | Discharge: 2024-03-14 | Disposition: A | Discharge status: Home / Self Care | Attending: Family Medicine | Admitting: Family Medicine

## 2024-03-14 VITALS — BP 142/84 | HR 93

## 2024-03-14 DIAGNOSIS — O0990 Supervision of high risk pregnancy, unspecified, unspecified trimester: Secondary | ICD-10-CM

## 2024-03-14 DIAGNOSIS — O10919 Unspecified pre-existing hypertension complicating pregnancy, unspecified trimester: Secondary | ICD-10-CM

## 2024-03-14 DIAGNOSIS — O10013 Pre-existing essential hypertension complicating pregnancy, third trimester: Secondary | ICD-10-CM | POA: Insufficient documentation

## 2024-03-14 DIAGNOSIS — O99013 Anemia complicating pregnancy, third trimester: Secondary | ICD-10-CM | POA: Insufficient documentation

## 2024-03-14 DIAGNOSIS — O09213 Supervision of pregnancy with history of pre-term labor, third trimester: Secondary | ICD-10-CM | POA: Insufficient documentation

## 2024-03-14 DIAGNOSIS — D509 Iron deficiency anemia, unspecified: Secondary | ICD-10-CM | POA: Diagnosis not present

## 2024-03-14 DIAGNOSIS — O09523 Supervision of elderly multigravida, third trimester: Secondary | ICD-10-CM | POA: Insufficient documentation

## 2024-03-14 DIAGNOSIS — O09293 Supervision of pregnancy with other poor reproductive or obstetric history, third trimester: Secondary | ICD-10-CM | POA: Diagnosis not present

## 2024-03-14 DIAGNOSIS — O99213 Obesity complicating pregnancy, third trimester: Secondary | ICD-10-CM

## 2024-03-14 DIAGNOSIS — Z3A32 32 weeks gestation of pregnancy: Secondary | ICD-10-CM | POA: Insufficient documentation

## 2024-03-14 DIAGNOSIS — R519 Headache, unspecified: Secondary | ICD-10-CM | POA: Diagnosis not present

## 2024-03-14 DIAGNOSIS — O10012 Pre-existing essential hypertension complicating pregnancy, second trimester: Secondary | ICD-10-CM | POA: Diagnosis not present

## 2024-03-14 DIAGNOSIS — O10912 Unspecified pre-existing hypertension complicating pregnancy, second trimester: Secondary | ICD-10-CM

## 2024-03-14 DIAGNOSIS — Z362 Encounter for other antenatal screening follow-up: Secondary | ICD-10-CM | POA: Diagnosis not present

## 2024-03-14 DIAGNOSIS — O0993 Supervision of high risk pregnancy, unspecified, third trimester: Secondary | ICD-10-CM | POA: Insufficient documentation

## 2024-03-14 DIAGNOSIS — I10 Essential (primary) hypertension: Secondary | ICD-10-CM

## 2024-03-14 DIAGNOSIS — O10913 Unspecified pre-existing hypertension complicating pregnancy, third trimester: Secondary | ICD-10-CM

## 2024-03-14 DIAGNOSIS — Z8759 Personal history of other complications of pregnancy, childbirth and the puerperium: Secondary | ICD-10-CM

## 2024-03-14 DIAGNOSIS — O09522 Supervision of elderly multigravida, second trimester: Secondary | ICD-10-CM

## 2024-03-14 DIAGNOSIS — O26893 Other specified pregnancy related conditions, third trimester: Secondary | ICD-10-CM | POA: Diagnosis not present

## 2024-03-14 DIAGNOSIS — O99212 Obesity complicating pregnancy, second trimester: Secondary | ICD-10-CM

## 2024-03-14 LAB — CERVICOVAGINAL ANCILLARY ONLY
Bacterial Vaginitis (gardnerella): NEGATIVE
Candida Glabrata: NEGATIVE
Candida Vaginitis: NEGATIVE
Chlamydia: NEGATIVE
Comment: NEGATIVE
Comment: NEGATIVE
Comment: NEGATIVE
Comment: NEGATIVE
Comment: NEGATIVE
Comment: NORMAL
Neisseria Gonorrhea: NEGATIVE
Trichomonas: NEGATIVE

## 2024-03-14 LAB — PROTEIN / CREATININE RATIO, URINE
Creatinine, Urine: 101 mg/dL
Protein Creatinine Ratio: 0.08 mg/mg{creat} (ref 0.00–0.15)
Total Protein, Urine: 8 mg/dL

## 2024-03-14 LAB — CBC
HCT: 29.7 % — ABNORMAL LOW (ref 36.0–46.0)
Hemoglobin: 9.9 g/dL — ABNORMAL LOW (ref 12.0–15.0)
MCH: 32.8 pg (ref 26.0–34.0)
MCHC: 33.3 g/dL (ref 30.0–36.0)
MCV: 98.3 fL (ref 80.0–100.0)
Platelets: 247 10*3/uL (ref 150–400)
RBC: 3.02 MIL/uL — ABNORMAL LOW (ref 3.87–5.11)
RDW: 13.6 % (ref 11.5–15.5)
WBC: 12 10*3/uL — ABNORMAL HIGH (ref 4.0–10.5)
nRBC: 0 % (ref 0.0–0.2)

## 2024-03-14 LAB — COMPREHENSIVE METABOLIC PANEL
ALT: 11 U/L (ref 0–44)
AST: 22 U/L (ref 15–41)
Albumin: 2.8 g/dL — ABNORMAL LOW (ref 3.5–5.0)
Alkaline Phosphatase: 54 U/L (ref 38–126)
Anion gap: 9 (ref 5–15)
BUN: 5 mg/dL — ABNORMAL LOW (ref 6–20)
CO2: 20 mmol/L — ABNORMAL LOW (ref 22–32)
Calcium: 8.1 mg/dL — ABNORMAL LOW (ref 8.9–10.3)
Chloride: 107 mmol/L (ref 98–111)
Creatinine, Ser: 0.63 mg/dL (ref 0.44–1.00)
GFR, Estimated: >60 mL/min (ref >60)
Glucose, Bld: 93 mg/dL (ref 70–99)
Potassium: 3.3 mmol/L — ABNORMAL LOW (ref 3.5–5.1)
Sodium: 136 mmol/L (ref 135–145)
Total Bilirubin: 0.3 mg/dL (ref 0.0–1.2)
Total Protein: 6 g/dL — ABNORMAL LOW (ref 6.5–8.1)

## 2024-03-14 MED ORDER — METOCLOPRAMIDE HCL 10 MG PO TABS: 10.0000 mg | ORAL_TABLET | Freq: Four times a day (QID) | ORAL | 0 refills | Status: DC

## 2024-03-14 MED ORDER — CAFFEINE 200 MG PO TABS
200.0000 mg | ORAL_TABLET | Freq: Once | ORAL | Status: AC
  Administered 2024-03-14: 200 mg via ORAL
  Filled 2024-03-14: qty 1

## 2024-03-14 MED ORDER — METOCLOPRAMIDE HCL 5 MG/ML IJ SOLN
10.0000 mg | Freq: Once | INTRAMUSCULAR | Status: AC
  Administered 2024-03-14: 10 mg via INTRAVENOUS
  Filled 2024-03-14: qty 2

## 2024-03-14 MED ORDER — DIPHENHYDRAMINE HCL 50 MG/ML IJ SOLN
25.0000 mg | Freq: Once | INTRAMUSCULAR | Status: AC
  Administered 2024-03-14: 25 mg via INTRAVENOUS
  Filled 2024-03-14: qty 1

## 2024-03-14 MED ORDER — MAGNESIUM OXIDE 400 MG PO TABS: 400.0000 mg | ORAL_TABLET | Freq: Every day | ORAL | 0 refills | Status: DC

## 2024-03-14 MED ORDER — BUTALBITAL-APAP-CAFFEINE 50-325-40 MG PO TABS: 2.0000 | ORAL_TABLET | Freq: Once | ORAL | Status: DC

## 2024-03-14 MED ORDER — FERROUS SULFATE 325 (65 FE) MG PO TABS: 325.0000 mg | ORAL_TABLET | ORAL | 0 refills | Status: AC

## 2024-03-14 MED ORDER — LABETALOL HCL 200 MG PO TABS: 400.0000 mg | ORAL_TABLET | Freq: Three times a day (TID) | ORAL | 1 refills | Status: DC

## 2024-03-14 MED ORDER — LABETALOL HCL 100 MG PO TABS
200.0000 mg | ORAL_TABLET | Freq: Once | ORAL | Status: AC
  Administered 2024-03-14: 200 mg via ORAL
  Filled 2024-03-14: qty 2

## 2024-03-14 MED ORDER — LABETALOL HCL 400 MG PO TABS: 400.0000 mg | ORAL_TABLET | Freq: Three times a day (TID) | ORAL | 1 refills | Status: DC

## 2024-03-14 NOTE — Discharge Instructions (Signed)
 Follow up with MFM as scheduled and OB Provider  Return to MAU if any symptoms or concerns

## 2024-03-14 NOTE — MAU Note (Signed)
 Paula Dominguez is a 43 y.o. at [redacted]w[redacted]d here in MAU reporting: sent over from office appointment secondary persistent HA and seeing spots.  Reports took Excedrin today @ 1130.  Denies epigastric pain currently but has had in past.   Denies VB or LOF.  Endorses +FM.  LMP: NA Onset of complaint: today Pain score: 9 Vitals:   03/14/24 1203  BP: (!) 140/87  Pulse: 91  Resp: 18  Temp: 98.3 F (36.8 C)  SpO2: 100%     FHT: 148 bpm  Lab orders placed from triage: UA

## 2024-03-14 NOTE — MAU Provider Note (Signed)
 Chief Complaint:  Headache   HPI    Paula Dominguez is a 43 y.o. [id number] at [redacted]w[redacted]d who presents to maternity admissions reporting a persistent HA today not resolved by Excedrin taken at 1130. She has a h/o Migraines and has taken Fioricet/ Excedrin in the past with resolve.  Denies RUQ pain, patient reports she has been having some visual changes with intermittent blurriness and floaters. Has seen her ophthalmologist and is scheduled to see Neurology beginning of April. She is cHTN on Labetelol ( Increased to 400mg  TID today by MFM)-    Received verbal report from Dr Bryn Gulling this morning after MFM ultrasound  Denies any OB complaints. No VB, LOF, CTX and reports goof FM's    Pregnancy Course: [facility]   Past Medical History:  Diagnosis Date   Anemia    Anxiety and depression    Blood transfusion without reported diagnosis    2014, 2018   GERD (gastroesophageal reflux disease)    History of preterm delivery, currently pregnant 01/30/2024   Hx of preeclampsia, prior pregnancy, currently pregnant    prior pregnancy   Hypertension    OB History  Gravida Para Term Preterm AB Living  11 8 7 1 2 7   SAB IAB Ectopic Multiple Live Births  2 0 0 0 8    # Outcome Date GA Lbr Len/2nd Weight Sex Type Anes PTL Lv  11 Current           10 SAB 2023          9 Preterm 07/08/21 [redacted]w[redacted]d / 00:04 2405 g F Vag-Spont EPI  DEC  8 Term 11/10/18 [redacted]w[redacted]d 08:49 / 00:03 2625 g F Vag-Spont EPI  LIV  7 Term 11/20/13 [redacted]w[redacted]d 06:10 / 00:12 2920 g M Vag-Spont EPI, Local  LIV  6 Term 12/23/10 [redacted]w[redacted]d  2722 g M Vag-Spont EPI  LIV     Birth Comments: preeclampsia  5 Term 03/29/06 [redacted]w[redacted]d  3175 g F Vag-Spont   LIV  4 SAB 2006 [redacted]w[redacted]d         3 Term 10/22/02 [redacted]w[redacted]d  3175 g M Vag-Spont   LIV  2 Term 08/10/98 [redacted]w[redacted]d  3175 g F Vag-Spont EPI  LIV  1 Term 03/23/95 [redacted]w[redacted]d  3175 g M Vag-Spont EPI  LIV   Past Surgical History:  Procedure Laterality Date   HEMORRHOID SURGERY     Family History  Problem  Relation Age of Onset   Cancer Mother    Healthy Mother    Heart disease Father    Healthy Father    Asthma Daughter    Asthma Son    Hypertension Maternal Grandmother    Diabetes Maternal Grandmother    Cancer Maternal Grandmother    Cancer Maternal Grandfather    Cancer Paternal Grandmother    Cancer Paternal Grandfather    Hypertension Other    Cancer Other    Social History   Tobacco Use   Smoking status: Former    Current packs/day: 0.00    Average packs/day: 0.3 packs/day for 17.0 years (4.3 ttl pk-yrs)    Types: Cigarettes    Start date: 07/27/1996    Quit date: 07/27/2013    Years since quitting: 10.6   Smokeless tobacco: Never   Tobacco comments:    3 cigs a day  Vaping Use   Vaping status: Former   Quit date: 10/14/2023   Substances: Nicotine, Flavoring  Substance Use Topics   Alcohol use: No   Drug use:  No   No Known Allergies Medications Prior to Admission  Medication Sig Dispense Refill Last Dose/Taking   acetaminophen-caffeine (EXCEDRIN TENSION HEADACHE) 500-65 MG TABS per tablet Take 2 tablets by mouth every 6 (six) hours as needed (headache, aches and pains, fever). 60 tablet 0 03/14/2024 at 11:34 AM   aspirin 81 MG chewable tablet Chew 1 tablet (81 mg total) by mouth daily. 30 tablet 4 03/14/2024   clonazePAM (KLONOPIN) 0.5 MG tablet Take 1 tablet (0.5 mg total) by mouth 2 (two) times daily as needed for anxiety. 30 tablet 3 03/13/2024   cyclobenzaprine (FLEXERIL) 10 MG tablet Take 1 tablet (10 mg total) by mouth 2 (two) times daily as needed for muscle spasms. 20 tablet 2 03/13/2024   omeprazole (PRILOSEC) 40 MG capsule Take 1 capsule (40 mg total) by mouth in the morning and at bedtime. 60 capsule 5 03/14/2024   Prenatal Vit-Fe Fumarate-FA (PNV PRENATAL PLUS MULTIVITAMIN) 27-1 MG TABS Take 1 capsule by mouth daily. 90 tablet 6 03/14/2024   progesterone (PROMETRIUM) 200 MG capsule Place one capsule vaginally at bedtime 30 capsule 3 03/14/2024   [DISCONTINUED]  labetalol (NORMODYNE) 300 MG tablet Take 1 tablet (300 mg total) by mouth 3 (three) times daily. 90 tablet 3 03/14/2024 at 11:34 AM   hydrOXYzine (ATARAX) 10 MG tablet Take 1 tablet (10 mg total) by mouth 3 (three) times daily as needed for anxiety. 30 tablet 1    Misc. Devices (WRIST BRACE) MISC 2 Devices by Does not apply route as needed. (Patient not taking: Reported on 03/12/2024) 2 each 0    Misc. Devices MISC 1 Device by Does not apply route as needed. Ergonomic keyboard for carpal tunnel syndrome 1 each 0    Nicotine 21-14-7 MG/24HR KIT Place 1 kit onto the skin as directed. Use as directed 1 kit 0    nicotine polacrilex (COMMIT) 4 MG lozenge Take 1 lozenge (4 mg total) by mouth as needed for smoking cessation. (Patient not taking: Reported on 03/12/2024) 100 tablet 0    polyethylene glycol powder (GLYCOLAX/MIRALAX) 17 GM/SCOOP powder Take 17 g by mouth daily. 510 g 2     I have reviewed patient's Past Medical Hx, Surgical Hx, Family Hx, Social Hx, medications and allergies.   ROS  Pertinent items noted in HPI and remainder of comprehensive ROS otherwise negative.   PHYSICAL EXAM  Patient Vitals for the past 24 hrs:  BP Temp Temp src Pulse Resp SpO2 Height Weight  03/14/24 1431 (!) 126/94 -- -- (!) 103 -- 96 % -- --  03/14/24 1416 (!) 141/85 -- -- (!) 102 -- 95 % -- --  03/14/24 1401 (!) 144/77 -- -- 96 -- 95 % -- --  03/14/24 1346 (!) 140/73 -- -- 93 -- 96 % -- --  03/14/24 1331 (!) 148/69 -- -- 92 -- 100 % -- --  03/14/24 1316 (!) 151/93 -- -- 91 -- 99 % -- --  03/14/24 1301 (!) 151/98 -- -- 90 -- 99 % -- --  03/14/24 1246 (!) 144/96 -- -- 95 -- 97 % -- --  03/14/24 1231 (!) 151/92 -- -- 87 -- 99 % -- --  03/14/24 1203 (!) 140/87 98.3 F (36.8 C) Oral 91 18 100 % -- --  03/14/24 1158 -- -- -- -- -- -- 5\' 7"  (1.702 m) 107 kg    Constitutional: Well-developed, obese female in no acute distress.  Cardiovascular: normal rate & rhythm, warm and well-perfused Respiratory: normal  effort, no problems with  respiration noted,  Lungs BCTA GI: Abd soft, non-tender, no RUQ pain illicited MS: Extremities nontender, no edema, normal ROM Neurologic: Alert and oriented x 4.  GU: no CVA tenderness B/L Pelvic: Deferred     Fetal Tracing: Cat 1 reactive @ 1416 Baseline: 140 Variability:moderate Accelerations: present Decelerations: absent Toco: no ctx   Labs: Results for orders placed or performed during the hospital encounter of 03/14/24 (from the past 24 hours)  Protein / creatinine ratio, urine     Status: None   Collection Time: 03/14/24 12:15 PM  Result Value Ref Range   Creatinine, Urine 101 mg/dL   Total Protein, Urine 8 mg/dL   Protein Creatinine Ratio 0.08 0.00 - 0.15 mg/mg[Cre]  CBC     Status: Abnormal   Collection Time: 03/14/24  1:06 PM  Result Value Ref Range   WBC 12.0 (H) 4.0 - 10.5 K/uL   RBC 3.02 (L) 3.87 - 5.11 MIL/uL   Hemoglobin 9.9 (L) 12.0 - 15.0 g/dL   HCT 64.3 (L) 32.9 - 51.8 %   MCV 98.3 80.0 - 100.0 fL   MCH 32.8 26.0 - 34.0 pg   MCHC 33.3 30.0 - 36.0 g/dL   RDW 84.1 66.0 - 63.0 %   Platelets 247 150 - 400 K/uL   nRBC 0.0 0.0 - 0.2 %  Comprehensive metabolic panel     Status: Abnormal   Collection Time: 03/14/24  1:06 PM  Result Value Ref Range   Sodium 136 135 - 145 mmol/L   Potassium 3.3 (L) 3.5 - 5.1 mmol/L   Chloride 107 98 - 111 mmol/L   CO2 20 (L) 22 - 32 mmol/L   Glucose, Bld 93 70 - 99 mg/dL   BUN 5 (L) 6 - 20 mg/dL   Creatinine, Ser 1.60 0.44 - 1.00 mg/dL   Calcium 8.1 (L) 8.9 - 10.3 mg/dL   Total Protein 6.0 (L) 6.5 - 8.1 g/dL   Albumin 2.8 (L) 3.5 - 5.0 g/dL   AST 22 15 - 41 U/L   ALT 11 0 - 44 U/L   Alkaline Phosphatase 54 38 - 126 U/L   Total Bilirubin 0.3 0.0 - 1.2 mg/dL   GFR, Estimated >10 >93 mL/min   Anion gap 9 5 - 15    Imaging:  [place] MFM FETAL BPP WO NON STRESS Result Date: 03/14/2024 ----------------------------------------------------------------------  OBSTETRICS REPORT                        (Signed Final 03/14/2024 11:02 am) ---------------------------------------------------------------------- Patient Info  ID #:       [id number]                          D.O.B.:  1981-05-13 (42 yrs)(F)  Name:       Paula Dominguez                 Visit Date: 03/14/2024 09:26 am              [place] ---------------------------------------------------------------------- Performed By  Attending:        Braxton Feathers DO       Secondary Phy.:   [facility]  Performed By:     Isac Sarna        Address:          [address]                    [address]                                                             [place], [place]                                                             [address]  Referred By:      Conan Bowens          [facility]                    MD                                       [facility]  Ref. Address:     [address]                    [place], [place]                    [address] ---------------------------------------------------------------------- Orders  #  Description                           Code        Ordered By  1  [place] MFM FETAL BPP WO NON               76819.01    BURK SCHAIBLE     STRESS  2  [place] MFM OB FOLLOW UP                   09811.91    Braxton Feathers ----------------------------------------------------------------------  #  Order #                     Accession #                Episode #  1  [id number]                   [id number]                 [id number]  2  [id number]  [id number]                 [id number] ---------------------------------------------------------------------- Indications  Advanced maternal age multigravida 70+,        O45.523  third trimester (42 yrs)  Hypertension - Chronic/Pre-existing            O10.019   (labetalol)  Obesity complicating pregnancy, third          O99.213  trimester (BMI 36)  Grand multiparity, antepartum                  O09.40  Other mental disorder complicating             O99.340  pregnancy, third trimester  Encounter for other antenatal screening        Z36.2  follow-up  Poor obstetric history: Previous preterm       O09.219  delivery, antepartum  Poor obstetric history: Previous               O09.299  preeclampsia / eclampsia/gestational HTN  [redacted] weeks gestation of pregnancy                Z3A.32 ---------------------------------------------------------------------- Fetal Evaluation  Num Of Fetuses:         1  Fetal Heart Rate(bpm):  153  Cardiac Activity:       Observed  Presentation:           Cephalic  Placenta:               Posterior  P. Cord Insertion:      Previously seen  Amniotic Fluid  AFI FV:      Within normal limits  AFI Sum(cm)     %Tile       Largest Pocket(cm)  14.95           53          4.18  RUQ(cm)       RLQ(cm)       LUQ(cm)        LLQ(cm)  4.18          3.75          2.92           4.1 ---------------------------------------------------------------------- Biophysical Evaluation  Amniotic F.V:   Pocket => 2 cm             F. Tone:        Observed  F. Movement:    Observed                   Score:          8/8  F. Breathing:   Observed ---------------------------------------------------------------------- Biometry  BPD:      77.2  mm     G. Age:  31w 0d         14  %    CI:        77.01   %    70 - 86                                                          FL/HC:      21.8   %    19.1 - 21.3  HC:      278.6  mm     G.  Age:  30w 3d        1.3  %    HC/AC:      1.05        0.96 - 1.17  AC:      266.4  mm     G. Age:  30w 5d         16  %    FL/BPD:     78.5   %    71 - 87  FL:       60.6  mm     G. Age:  31w 4d         24  %    FL/AC:      22.7   %    20 - 24  LV:        1.5  mm  Est. FW:    1676  gm    3 lb 11 oz      13  %  ---------------------------------------------------------------------- OB History  Blood Type:   O+  Gravidity:    11        Term:   7        Prem:   1        SAB:   2  Living:       8 ---------------------------------------------------------------------- Gestational Age  LMP:           34w 4d        Date:  07/16/23                  EDD:   04/21/24  U/S Today:     31w 0d                                        EDD:   05/16/24  Best:          32w 0d     Det. ByMarcella Dubs         EDD:   05/09/24                                      (09/15/23) ---------------------------------------------------------------------- Targeted Anatomy  Central Nervous System  Calvarium/Cranial V.:  Appears normal         Cereb./Vermis:          Previously seen  Cavum:                 Previously seen        Sales executive:         Previously seen  Lateral Ventricles:    Previously seen        Midline Falx:           Previously seen  Choroid Plexus:        Previously seen  Spine  Cervical:              Previously seen        Sacral:                 Previously seen  Thoracic:              Previously seen        Shape/Curvature:        Previously seen  Lumbar:  Previously seen  Head/Neck  Lips:                  Previously seen        Profile:                Previously seen  Neck:                  Previously seen        Orbits/Eyes:            Previously seen  Nuchal Fold:           Previously seen        Mandible:               Previously seen  Nasal Bone:            Previously seen        Maxilla:                Previously seen  Thorax  4 Chamber View:        Previously seen        Interventr. Septum:     Previously seen  Cardiac Activity:      Observed               Cardiac Axis:           Previously seen  Cardiac Situs:         Previously seen        Diaphragm:              Appears normal  Rt Outflow Tract:      Previously seen        3 Vessel View:          Previously seen  Lt Outflow Tract:      Previously seen        3 V  Trachea View:       Previously seen  Aortic Arch:           Previously seen        IVC:                    Previously seen  Ductal Arch:           Appears normal         Crossing:               Not well visualized  SVC:                   Previously seen  Abdomen  Ventral Wall:          Previously seen        Lt Kidney:              Appears normal  Cord Insertion:        Previously seen        Rt Kidney:              Appears normal  Situs:                 Previously seen        Bladder:                Appears normal  Stomach:               Appears normal  Extremities  Lt Humerus:  Previously seen        Lt Femur:               Previously seen  Rt Humerus:            Previously seen        Rt Femur:               Previously seen  Lt Forearm:            Previously seen        Lt Lower Leg:           Previously seen  Rt Forearm:            Previously seen        Rt Lower Leg:           Previously seen  Lt Hand:               Previously seen        Lt Foot:                Previously seen  Rt Hand:               Previously seen        Rt Foot:                Previously seen  Other  Umbilical Cord:        Previously seen        Genitalia:              Previously seen  Comment:     Technically difficult due to maternal habitus and fetal position. ---------------------------------------------------------------------- Comments  MFM Consult Note  Paula Dominguez has a pregnancy with the  complications mentioned in the problem list. During today's  visit we focused on the following concerns:  RE hx of preeclampsia, CHTN, headache and hx of newborn  demise (SIDS): This patient was seen in the office today for a  biophysical profile which is normal.  She also had a growth  ultrasound that showed an estimated fetal weight around the  13th percentile.  The estimated fetal weight has continued to  drop since her anatomy scan where was around 55th  percentile.  She has a history of preeclampsia as well as a   newborn demise from SIDS in her most recent pregnancy.  The patient reports a 2-week history of a headache that is  gone increasing in intensity.  She occasionally has visual  disturbances associated with the headache.  She was  referred to neurology but does not have an appointment until  1 April per her report.  She is taking Tylenol, Flexeril, Fioricet  all with little relief.  She does have a history of migraines.  I  discussed my concern for preeclampsia given her numerous  risk factors for superimposed preeclampsia on chronic  hypertension.  I discussed that in order to rule out  preeclampsia she needs labs done today.  I also called the  MAU provider to discuss a more thorough approach to her  headache  I encouraged them to expedite a conversation with  neurology to see if there are any other medications that may  be effective in the event that she does not have preeclampsia.  Sonographic findings  Single intrauterine pregnancy.  Fetal cardiac activity: Observed.  Presentation: Cephalic.  Interval fetal anatomy appears normal.  Fetal biometry shows the estimated fetal weight at the 13  percentile and  the abdominal circumference at the 16  percentile.  Amniotic fluid: Within normal limits.  MVP: 4.18 cm.  Placenta: Posterior.  BPP: 8/8.  There are limitations of prenatal ultrasound such as the  inability to detect certain abnormalities due to poor  visualization. Various factors such as fetal position,  gestational age and maternal body habitus may increase the  difficulty in visualizing the fetal anatomy.  Recommendations  -Sent to MAU for rule out preeclampsia and evaluation of her  headache  -Continue weekly antenatal testing.  If she develops  preeclampsia without severe features then antenatal testing  should be increased to twice weekly with weekly labs  (CBC/CMP)  -Due to the declining fetal growth and the growth ultrasound  should be done in 3 weeks.  -Since the blood pressure is above goal today we  will  increase her labetalol to 400 three times a day  -Delivery timing will be pending clinical course but likely 7  weeks  -Continue routine prenatal care with referring OB provider ----------------------------------------------------------------------                  Braxton Feathers, DO Electronically Signed Final Report   03/14/2024 11:02 am ----------------------------------------------------------------------   [place] MFM OB FOLLOW UP Result Date: 03/14/2024 ----------------------------------------------------------------------  OBSTETRICS REPORT                       (Signed Final 03/14/2024 11:02 am) ---------------------------------------------------------------------- Patient Info  ID #:       [id number]                          D.O.B.:  25-Jan-1981 (42 yrs)(F)  Name:       Paula Dominguez                 Visit Date: 03/14/2024 09:26 am              [place] ---------------------------------------------------------------------- Performed By  Attending:        Braxton Feathers DO       Secondary Phy.:   [facility]  Performed By:     Isac Sarna        Address:          [address]                    [address]                                                             [address]                                                             [place], [place]                                                             [address]  Referred  By:      Ivory Broad DAVIS          Location[facility]         [facility]                    MD                                       [facility]  Ref. Address:     [address]                    [place], [place]                    [address] ---------------------------------------------------------------------- Orders  #  Description                           Code        Ordered By  1  [place] MFM FETAL BPP WO NON               76819.01    BURK SCHAIBLE      STRESS  2  [place] MFM OB FOLLOW UP                   76816.01    [facility] ----------------------------------------------------------------------  #  Order #                     Accession #                Episode #  1  [id number]                   [id number]                 [id number]  2  [id number]                   [id number]                 [id number] ---------------------------------------------------------------------- Indications  Advanced maternal age multigravida 15+,        O40.523  third trimester (42 yrs)  Hypertension - Chronic/Pre-existing            O10.019  (labetalol)  Obesity complicating pregnancy, third          O99.213  trimester (BMI 36)  Grand multiparity, antepartum                  O09.40  Other mental disorder complicating             O99.340  pregnancy, third  trimester  Encounter for other antenatal screening        Z36.2  follow-up  Poor obstetric history: Previous preterm       O09.219  delivery, antepartum  Poor obstetric history: Previous               O09.299  preeclampsia / eclampsia/gestational HTN  [redacted] weeks gestation of pregnancy                Z3A.32 ---------------------------------------------------------------------- Fetal Evaluation  Num Of Fetuses:         1  Fetal Heart Rate(bpm):  153  Cardiac Activity:       Observed  Presentation:           Cephalic  Placenta:               Posterior  P. Cord Insertion:      Previously seen  Amniotic Fluid  AFI FV:      Within normal limits  AFI Sum(cm)     %Tile       Largest Pocket(cm)  14.95           53          4.18  RUQ(cm)       RLQ(cm)       LUQ(cm)        LLQ(cm)  4.18          3.75          2.92           4.1 ---------------------------------------------------------------------- Biophysical Evaluation  Amniotic F.V:   Pocket => 2 cm             F. Tone:        Observed  F. Movement:    Observed                   Score:          8/8  F. Breathing:   Observed ----------------------------------------------------------------------  Biometry  BPD:      77.2  mm     G. Age:  31w 0d         14  %    CI:        77.01   %    70 - 86                                                          FL/HC:      21.8   %    19.1 - 21.3  HC:      278.6  mm     G. Age:  30w 3d        1.3  %    HC/AC:      1.05        0.96 - 1.17  AC:      266.4  mm     G. Age:  30w 5d         16  %    FL/BPD:     78.5   %    71 - 87  FL:       60.6  mm     G. Age:  31w 4d         24  %    FL/AC:  22.7   %    20 - 24  LV:        1.5  mm  Est. FW:    1676  gm    3 lb 11 oz      13  % ---------------------------------------------------------------------- OB History  Blood Type:   O+  Gravidity:    11        Term:   7        Prem:   1        SAB:   2  Living:       8 ---------------------------------------------------------------------- Gestational Age  LMP:           34w 4d        Date:  07/16/23                  EDD:   04/21/24  U/S Today:     31w 0d                                        EDD:   05/16/24  Best:          32w 0d     Det. ByMarcella Dubs         EDD:   05/09/24                                      (09/15/23) ---------------------------------------------------------------------- Targeted Anatomy  Central Nervous System  Calvarium/Cranial V.:  Appears normal         Cereb./Vermis:          Previously seen  Cavum:                 Previously seen        Sales executive:         Previously seen  Lateral Ventricles:    Previously seen        Midline Falx:           Previously seen  Choroid Plexus:        Previously seen  Spine  Cervical:              Previously seen        Sacral:                 Previously seen  Thoracic:              Previously seen        Shape/Curvature:        Previously seen  Lumbar:                Previously seen  Head/Neck  Lips:                  Previously seen        Profile:                Previously seen  Neck:                  Previously seen        Orbits/Eyes:            Previously seen  Nuchal Fold:           Previously seen         Mandible:  Previously seen  Nasal Bone:            Previously seen        Maxilla:                Previously seen  Thorax  4 Chamber View:        Previously seen        Interventr. Septum:     Previously seen  Cardiac Activity:      Observed               Cardiac Axis:           Previously seen  Cardiac Situs:         Previously seen        Diaphragm:              Appears normal  Rt Outflow Tract:      Previously seen        3 Vessel View:          Previously seen  Lt Outflow Tract:      Previously seen        3 V Trachea View:       Previously seen  Aortic Arch:           Previously seen        IVC:                    Previously seen  Ductal Arch:           Appears normal         Crossing:               Not well visualized  SVC:                   Previously seen  Abdomen  Ventral Wall:          Previously seen        Lt Kidney:              Appears normal  Cord Insertion:        Previously seen        Rt Kidney:              Appears normal  Situs:                 Previously seen        Bladder:                Appears normal  Stomach:               Appears normal  Extremities  Lt Humerus:            Previously seen        Lt Femur:               Previously seen  Rt Humerus:            Previously seen        Rt Femur:               Previously seen  Lt Forearm:            Previously seen        Lt Lower Leg:           Previously seen  Rt Forearm:            Previously seen        Rt  Lower Leg:           Previously seen  Lt Hand:               Previously seen        Lt Foot:                Previously seen  Rt Hand:               Previously seen        Rt Foot:                Previously seen  Other  Umbilical Cord:        Previously seen        Genitalia:              Previously seen  Comment:     Technically difficult due to maternal habitus and fetal position. ---------------------------------------------------------------------- Comments  MFM Consult Note  Paula Dominguez has a pregnancy with  the  complications mentioned in the problem list. During today's  visit we focused on the following concerns:  RE hx of preeclampsia, CHTN, headache and hx of newborn  demise (SIDS): This patient was seen in the office today for a  biophysical profile which is normal.  She also had a growth  ultrasound that showed an estimated fetal weight around the  13th percentile.  The estimated fetal weight has continued to  drop since her anatomy scan where was around 55th  percentile.  She has a history of preeclampsia as well as a  newborn demise from SIDS in her most recent pregnancy.  The patient reports a 2-week history of a headache that is  gone increasing in intensity.  She occasionally has visual  disturbances associated with the headache.  She was  referred to neurology but does not have an appointment until  1 April per her report.  She is taking Tylenol, Flexeril, Fioricet  all with little relief.  She does have a history of migraines.  I  discussed my concern for preeclampsia given her numerous  risk factors for superimposed preeclampsia on chronic  hypertension.  I discussed that in order to rule out  preeclampsia she needs labs done today.  I also called the  MAU provider to discuss a more thorough approach to her  headache  I encouraged them to expedite a conversation with  neurology to see if there are any other medications that may  be effective in the event that she does not have preeclampsia.  Sonographic findings  Single intrauterine pregnancy.  Fetal cardiac activity: Observed.  Presentation: Cephalic.  Interval fetal anatomy appears normal.  Fetal biometry shows the estimated fetal weight at the 13  percentile and the abdominal circumference at the 16  percentile.  Amniotic fluid: Within normal limits.  MVP: 4.18 cm.  Placenta: Posterior.  BPP: 8/8.  There are limitations of prenatal ultrasound such as the  inability to detect certain abnormalities due to poor  visualization. Various factors such as  fetal position,  gestational age and maternal body habitus may increase the  difficulty in visualizing the fetal anatomy.  Recommendations  -Sent to MAU for rule out preeclampsia and evaluation of her  headache  -Continue weekly antenatal testing.  If she develops  preeclampsia without severe features then antenatal testing  should be increased to twice weekly with weekly labs  (CBC/CMP)  -Due to the declining fetal growth and the growth ultrasound  should be done in  3 weeks.  -Since the blood pressure is above goal today we will  increase her labetalol to 400 three times a day  -Delivery timing will be pending clinical course but likely 7  weeks  -Continue routine prenatal care with referring OB provider ----------------------------------------------------------------------                  Braxton Feathers, DO Electronically Signed Final Report   03/14/2024 11:02 am ----------------------------------------------------------------------    MDM & MAU COURSE  MDM:  HIGH  BP's monitoring - MRBP's PreE Labs: Unremarkable with no proteinuria (0.08) Labetalol 200 mg PO x 1 d/t increased dosage ( Patient only took 600 mg thus far) NST: Reactive  HA - Cocktail administered  Reassessed at 1420  HA now 6/10 decreased from 10/10 on presentation Will give caffeine tablet x 1 dose since she had Excedrin @ 1130   HA now 4/10 which pt reports as baseline - Will plan for discharge at this time 1517 with strict return precautions - BP at discharge 126/94  MAU Course: Orders Placed This Encounter  Procedures   CBC   Comprehensive metabolic panel   Protein / creatinine ratio, urine   Fetal nonstress test   Saline lock IV   Discharge patient Discharge disposition: 01-Home or Self Care; Discharge patient date: 03/14/2024   Meds ordered this encounter  Medications   metoCLOPramide (REGLAN) injection 10 mg   diphenhydrAMINE (BENADRYL) injection 25 mg   labetalol (NORMODYNE) tablet 200 mg   DISCONTD:  butalbital-acetaminophen-caffeine (FIORICET) 50-325-40 MG per tablet 2 tablet   caffeine tablet 200 mg   labetalol 400 MG TABS    Sig: Take 400 mg by mouth 3 (three) times daily.    Dispense:  30 tablet    Refill:  1    Supervising Provider:   Reva Bores [[id number]]   metoCLOPramide (REGLAN) 10 MG tablet    Sig: Take 1 tablet (10 mg total) by mouth every 6 (six) hours.    Dispense:  30 tablet    Refill:  0    Supervising Provider:   Reva Bores [[id number]]   magnesium oxide (MAG-OX) 400 MG tablet    Sig: Take 1 tablet (400 mg total) by mouth daily.    Dispense:  90 tablet    Refill:  0    Supervising Provider:   Reva Bores [[id number]]   ferrous sulfate 325 (65 FE) MG tablet    Sig: Take 1 tablet (325 mg total) by mouth every other day.    Dispense:  15 tablet    Refill:  0    Supervising Provider:   Reva Bores [[id number]]    ASSESSMENT   1. Supervision of high risk pregnancy, antepartum   2. Chronic hypertension during pregnancy   3. [redacted] weeks gestation of pregnancy   4. Acute nonintractable headache, unspecified headache type   5. High-risk pregnancy, elderly multigravida, third trimester   6. Chronic hypertension   7. Iron deficiency anemia, unspecified iron deficiency anemia type     PLAN  Discharge home in stable condition with return precautions.  MFM follow up as scheduled consider twice weekly testing per MFM report SEE AVS for full description of verbal and written instructions and precautions given at discharge. Patient verbalized understanding and agrees with plan as described above  Future Appointments  Date Time Provider Department Center  03/19/2024  1:30 PM [facility] US4 [facility]  03/19/2024  2:15 PM [facility] CLINIC[facility]  03/26/2024  1:30 PM [facility] US2 [facility]  03/27/2024 11:00 AM Nwoko, Uchenna E, PA [facility] None  04/02/2024  1:30 PM [facility] US2 [facility]  04/09/2024  1:30 PM [facility] US2 [facility]  04/18/2024  8:00 AM Cozart,  Neena Rhymes, LCSW [facility] None  05/09/2024  8:00 AM Cozart, Neena Rhymes, LCSW [facility] None  05/30/2024  8:00 AM Cozart, Neena Rhymes, LCSW [facility] None       Follow-up Information     [facility] [employer] [facility] Follow up.   Specialty: Obstetrics and Gynecology Why: If symptoms worsen or fail to resolve, As scheduled for ongoing prenatal care Contact information: [address] [place] [address] [phone]                  Marcell Barlow, MSN, [facility], [facility] [employer]

## 2024-03-14 NOTE — Progress Notes (Signed)
 [facility] pharmacy called office to notify us their supplier dose not offer 400 mg labetalol tablet. They are able to give two 200 mg tablets for total of 400 mg but will need new prescription. Reviewed with [place], NP prior to ordering 30 day supply.

## 2024-03-14 NOTE — Progress Notes (Signed)
 Patient information  Patient Name: Paula Dominguez  Patient MRN:   [id number]  Referring practice: MFM Referring Provider: [facility])  MFM CONSULT  Paula Dominguez is a 43 y.o. [id number] at [redacted]w[redacted]d here for ultrasound and consultation. Patient Active Problem List   Diagnosis Date Noted   Unwanted fertility 02/27/2024   Tobacco use disorder 02/13/2024   History of preterm delivery, currently pregnant 01/30/2024   Bilateral carpal tunnel syndrome 01/11/2024   Tinea versicolor 12/12/2023   Supervision of high risk pregnancy, antepartum 10/06/2023   Chronic hypertension during pregnancy, antepartum 08/13/2021   AMA (advanced maternal age) multigravida 35+ 07/07/2021   Insomnia due to medical condition 01/08/2019   Grand multiparity 07/11/2018   Iron deficiency anemia 01/08/2018   Genital warts 01/12/2016   Migraine headache 01/12/2016   Moderate major depression (HCC) 01/12/2016   Bipolar disorder (HCC) 01/12/2016   History of postpartum depression 01/09/2014   History of pre-eclampsia 11/19/2013   Paula Dominguez has a pregnancy with the complications mentioned in the problem list. During today's visit we focused on the following concerns:   RE hx of preeclampsia, CHTN, headache and hx of newborn demise (SIDS): This patient was seen in the office today for a biophysical profile which is normal.  She also had a growth ultrasound that showed an estimated fetal weight around the 13th percentile.  The estimated fetal weight has continued to drop since her anatomy scan where was around 55th percentile.  She has a history of preeclampsia as well as a newborn demise from SIDS in her most recent pregnancy.  The patient reports a 2-week history of a headache that is gone increasing in intensity.  She occasionally has visual disturbances associated with the headache.  She was referred to neurology but does not have an appointment until 1 April  per her report.  She is taking Tylenol, Flexeril, Fioricet all with little relief.  She does have a history of migraines.  I discussed my concern for preeclampsia given her numerous risk factors for superimposed preeclampsia on chronic hypertension.  I discussed that in order to rule out preeclampsia she needs labs done today.  I also called the MAU provider to discuss a more thorough approach to her headache  I encouraged them to expedite a conversation with neurology to see if there are any other medications that may be effective in the event that she does not have preeclampsia.  Sonographic findings Single intrauterine pregnancy. Fetal cardiac activity: Observed. Presentation: Cephalic. Interval fetal anatomy appears normal. Fetal biometry shows the estimated fetal weight at the 13 percentile and the abdominal circumference at the 16 percentile.  Amniotic fluid: Within normal limits.  MVP: 4.18 cm. Placenta: Posterior. BPP: 8/8.   There are limitations of prenatal ultrasound such as the inability to detect certain abnormalities due to poor visualization. Various factors such as fetal position, gestational age and maternal body habitus may increase the difficulty in visualizing the fetal anatomy.    Recommendations -Sent to MAU for rule out preeclampsia and evaluation of her headache  -Continue weekly antenatal testing.  If she develops preeclampsia without severe features then antenatal testing should be increased to twice weekly with weekly labs (CBC/CMP) -Due to the declining fetal growth and the growth ultrasound should be done in 3 weeks. -Since the blood pressure is above goal today we will increase her labetalol to 400 three times a day -Delivery timing will be pending clinical course but likely  7 weeks -Continue routine prenatal care with referring OB provider  Review of Systems: A review of systems was performed and was negative except per HPI   Vitals and Physical Exam     03/14/2024   10:03 AM 03/14/2024    9:23 AM 03/12/2024    1:53 PM  Vitals with BMI  Weight   235 lbs 3 oz  Systolic 142 142 308  Diastolic 85 84 83  Pulse 89 93 90    Sitting comfortably on the sonogram table Nonlabored breathing Normal rate and rhythm Abdomen is nontender  Past pregnancies OB History  Gravida Para Term Preterm AB Living  11 8 7 1 2 7   SAB IAB Ectopic Multiple Live Births  2 0 0 0 8    # Outcome Date GA Lbr Len/2nd Weight Sex Type Anes PTL Lv  11 Current           10 SAB 2023          9 Preterm 07/08/21 [redacted]w[redacted]d / 00:04 5 lb 4.8 oz (2.405 kg) F Vag-Spont EPI  DEC  8 Term 11/10/18 [redacted]w[redacted]d 08:49 / 00:03 5 lb 12.6 oz (2.625 kg) F Vag-Spont EPI  LIV  7 Term 11/20/13 [redacted]w[redacted]d 06:10 / 00:12 6 lb 7 oz (2.92 kg) M Vag-Spont EPI, Local  LIV  6 Term 12/23/10 [redacted]w[redacted]d  6 lb (2.722 kg) M Vag-Spont EPI  LIV     Birth Comments: preeclampsia  5 Term 03/29/06 [redacted]w[redacted]d  7 lb (3.175 kg) F Vag-Spont   LIV  4 SAB 2006 [redacted]w[redacted]d         3 Term 10/22/02 [redacted]w[redacted]d  7 lb (3.175 kg) M Vag-Spont   LIV  2 Term 08/10/98 [redacted]w[redacted]d  7 lb (3.175 kg) F Vag-Spont EPI  LIV  1 Term 03/23/95 [redacted]w[redacted]d  7 lb (3.175 kg) M Vag-Spont EPI  LIV    I spent 30 minutes reviewing the patients chart, including labs and images as well as counseling the patient about her medical conditions. Greater than 50% of the time was spent in direct face-to-face patient counseling.  Braxton Feathers, DO Maternal fetal medicine,    03/14/2024  10:44 AM

## 2024-03-19 ENCOUNTER — Ambulatory Visit: Attending: Maternal & Fetal Medicine | Admitting: Maternal & Fetal Medicine

## 2024-03-19 ENCOUNTER — Encounter: Payer: Self-pay | Admitting: *Deleted

## 2024-03-19 ENCOUNTER — Encounter (HOSPITAL_COMMUNITY): Payer: Self-pay | Admitting: Obstetrics & Gynecology

## 2024-03-19 ENCOUNTER — Other Ambulatory Visit: Payer: Self-pay

## 2024-03-19 ENCOUNTER — Ambulatory Visit

## 2024-03-19 ENCOUNTER — Ambulatory Visit (HOSPITAL_BASED_OUTPATIENT_CLINIC_OR_DEPARTMENT_OTHER): Payer: Medicaid Other | Attending: Obstetrics and Gynecology

## 2024-03-19 ENCOUNTER — Inpatient Hospital Stay (HOSPITAL_COMMUNITY)
Admission: AD | Admit: 2024-03-19 | Discharge: 2024-03-25 | DRG: 768 | Disposition: A | Discharge status: Home / Self Care | Payer: Self-pay | Attending: Obstetrics & Gynecology | Admitting: Obstetrics & Gynecology

## 2024-03-19 VITALS — BP 148/97 | HR 102

## 2024-03-19 DIAGNOSIS — O99213 Obesity complicating pregnancy, third trimester: Secondary | ICD-10-CM | POA: Diagnosis not present

## 2024-03-19 DIAGNOSIS — F419 Anxiety disorder, unspecified: Secondary | ICD-10-CM | POA: Diagnosis present

## 2024-03-19 DIAGNOSIS — O9902 Anemia complicating childbirth: Secondary | ICD-10-CM | POA: Diagnosis present

## 2024-03-19 DIAGNOSIS — O09522 Supervision of elderly multigravida, second trimester: Secondary | ICD-10-CM

## 2024-03-19 DIAGNOSIS — O1493 Unspecified pre-eclampsia, third trimester: Secondary | ICD-10-CM | POA: Diagnosis not present

## 2024-03-19 DIAGNOSIS — O9962 Diseases of the digestive system complicating childbirth: Secondary | ICD-10-CM | POA: Diagnosis present

## 2024-03-19 DIAGNOSIS — O114 Pre-existing hypertension with pre-eclampsia, complicating childbirth: Principal | ICD-10-CM | POA: Diagnosis present

## 2024-03-19 DIAGNOSIS — Z833 Family history of diabetes mellitus: Secondary | ICD-10-CM | POA: Diagnosis not present

## 2024-03-19 DIAGNOSIS — O99214 Obesity complicating childbirth: Secondary | ICD-10-CM | POA: Diagnosis present

## 2024-03-19 DIAGNOSIS — Z3A32 32 weeks gestation of pregnancy: Secondary | ICD-10-CM | POA: Diagnosis not present

## 2024-03-19 DIAGNOSIS — R519 Headache, unspecified: Secondary | ICD-10-CM

## 2024-03-19 DIAGNOSIS — O365930 Maternal care for other known or suspected poor fetal growth, third trimester, not applicable or unspecified: Secondary | ICD-10-CM | POA: Insufficient documentation

## 2024-03-19 DIAGNOSIS — E669 Obesity, unspecified: Secondary | ICD-10-CM | POA: Diagnosis not present

## 2024-03-19 DIAGNOSIS — D509 Iron deficiency anemia, unspecified: Secondary | ICD-10-CM | POA: Diagnosis present

## 2024-03-19 DIAGNOSIS — K219 Gastro-esophageal reflux disease without esophagitis: Secondary | ICD-10-CM | POA: Diagnosis present

## 2024-03-19 DIAGNOSIS — O0990 Supervision of high risk pregnancy, unspecified, unspecified trimester: Secondary | ICD-10-CM | POA: Insufficient documentation

## 2024-03-19 DIAGNOSIS — O1410 Severe pre-eclampsia, unspecified trimester: Secondary | ICD-10-CM | POA: Diagnosis present

## 2024-03-19 DIAGNOSIS — O99820 Streptococcus B carrier state complicating pregnancy: Secondary | ICD-10-CM | POA: Diagnosis not present

## 2024-03-19 DIAGNOSIS — Z3009 Encounter for other general counseling and advice on contraception: Secondary | ICD-10-CM | POA: Diagnosis present

## 2024-03-19 DIAGNOSIS — O10013 Pre-existing essential hypertension complicating pregnancy, third trimester: Secondary | ICD-10-CM | POA: Diagnosis not present

## 2024-03-19 DIAGNOSIS — O119 Pre-existing hypertension with pre-eclampsia, unspecified trimester: Principal | ICD-10-CM | POA: Diagnosis present

## 2024-03-19 DIAGNOSIS — Z3A33 33 weeks gestation of pregnancy: Secondary | ICD-10-CM | POA: Diagnosis not present

## 2024-03-19 DIAGNOSIS — O1414 Severe pre-eclampsia complicating childbirth: Secondary | ICD-10-CM | POA: Diagnosis not present

## 2024-03-19 DIAGNOSIS — F17200 Nicotine dependence, unspecified, uncomplicated: Secondary | ICD-10-CM | POA: Diagnosis present

## 2024-03-19 DIAGNOSIS — Z8759 Personal history of other complications of pregnancy, childbirth and the puerperium: Secondary | ICD-10-CM | POA: Diagnosis present

## 2024-03-19 DIAGNOSIS — G43909 Migraine, unspecified, not intractable, without status migrainosus: Secondary | ICD-10-CM | POA: Diagnosis present

## 2024-03-19 DIAGNOSIS — O113 Pre-existing hypertension with pre-eclampsia, third trimester: Secondary | ICD-10-CM | POA: Diagnosis not present

## 2024-03-19 DIAGNOSIS — O09523 Supervision of elderly multigravida, third trimester: Secondary | ICD-10-CM | POA: Diagnosis not present

## 2024-03-19 DIAGNOSIS — O10012 Pre-existing essential hypertension complicating pregnancy, second trimester: Secondary | ICD-10-CM

## 2024-03-19 DIAGNOSIS — O0943 Supervision of pregnancy with grand multiparity, third trimester: Secondary | ICD-10-CM | POA: Diagnosis not present

## 2024-03-19 DIAGNOSIS — R609 Edema, unspecified: Secondary | ICD-10-CM | POA: Diagnosis not present

## 2024-03-19 DIAGNOSIS — F321 Major depressive disorder, single episode, moderate: Secondary | ICD-10-CM | POA: Diagnosis present

## 2024-03-19 DIAGNOSIS — O99824 Streptococcus B carrier state complicating childbirth: Secondary | ICD-10-CM | POA: Diagnosis present

## 2024-03-19 DIAGNOSIS — O99344 Other mental disorders complicating childbirth: Secondary | ICD-10-CM | POA: Diagnosis present

## 2024-03-19 DIAGNOSIS — O10919 Unspecified pre-existing hypertension complicating pregnancy, unspecified trimester: Secondary | ICD-10-CM

## 2024-03-19 DIAGNOSIS — O99334 Smoking (tobacco) complicating childbirth: Secondary | ICD-10-CM | POA: Diagnosis not present

## 2024-03-19 DIAGNOSIS — Z87891 Personal history of nicotine dependence: Secondary | ICD-10-CM | POA: Diagnosis not present

## 2024-03-19 DIAGNOSIS — O1092 Unspecified pre-existing hypertension complicating childbirth: Secondary | ICD-10-CM | POA: Diagnosis present

## 2024-03-19 DIAGNOSIS — O368130 Decreased fetal movements, third trimester, not applicable or unspecified: Secondary | ICD-10-CM | POA: Diagnosis present

## 2024-03-19 DIAGNOSIS — O10912 Unspecified pre-existing hypertension complicating pregnancy, second trimester: Secondary | ICD-10-CM | POA: Insufficient documentation

## 2024-03-19 DIAGNOSIS — Z7982 Long term (current) use of aspirin: Secondary | ICD-10-CM | POA: Diagnosis not present

## 2024-03-19 DIAGNOSIS — Z8249 Family history of ischemic heart disease and other diseases of the circulatory system: Secondary | ICD-10-CM

## 2024-03-19 LAB — PROTEIN / CREATININE RATIO, URINE
Creatinine, Urine: 60 mg/dL
Total Protein, Urine: <6 mg/dL

## 2024-03-19 LAB — CBC
HCT: 29.6 % — ABNORMAL LOW (ref 36.0–46.0)
Hemoglobin: 10.2 g/dL — ABNORMAL LOW (ref 12.0–15.0)
MCH: 33.2 pg (ref 26.0–34.0)
MCHC: 34.5 g/dL (ref 30.0–36.0)
MCV: 96.4 fL (ref 80.0–100.0)
Platelets: 228 10*3/uL (ref 150–400)
RBC: 3.07 MIL/uL — ABNORMAL LOW (ref 3.87–5.11)
RDW: 13.8 % (ref 11.5–15.5)
WBC: 11.4 10*3/uL — ABNORMAL HIGH (ref 4.0–10.5)
nRBC: 0 % (ref 0.0–0.2)

## 2024-03-19 LAB — COMPREHENSIVE METABOLIC PANEL
ALT: 12 U/L (ref 0–44)
AST: 17 U/L (ref 15–41)
Albumin: 2.9 g/dL — ABNORMAL LOW (ref 3.5–5.0)
Alkaline Phosphatase: 60 U/L (ref 38–126)
Anion gap: 10 (ref 5–15)
BUN: <5 mg/dL — ABNORMAL LOW (ref 6–20)
CO2: 19 mmol/L — ABNORMAL LOW (ref 22–32)
Calcium: 9 mg/dL (ref 8.9–10.3)
Chloride: 106 mmol/L (ref 98–111)
Creatinine, Ser: 0.6 mg/dL (ref 0.44–1.00)
GFR, Estimated: >60 mL/min (ref >60)
Glucose, Bld: 87 mg/dL (ref 70–99)
Potassium: 3.4 mmol/L — ABNORMAL LOW (ref 3.5–5.1)
Sodium: 135 mmol/L (ref 135–145)
Total Bilirubin: 0.5 mg/dL (ref 0.0–1.2)
Total Protein: 6.3 g/dL — ABNORMAL LOW (ref 6.5–8.1)

## 2024-03-19 LAB — TYPE AND SCREEN
ABO/RH(D): O POS
Antibody Screen: NEGATIVE

## 2024-03-19 MED ORDER — LABETALOL HCL 200 MG PO TABS
600.0000 mg | ORAL_TABLET | Freq: Three times a day (TID) | ORAL | Status: DC
  Administered 2024-03-19 – 2024-03-22 (×8): 600 mg via ORAL
  Filled 2024-03-19 (×8): qty 3

## 2024-03-19 MED ORDER — LABETALOL HCL 5 MG/ML IV SOLN: 80.0000 mg | INTRAVENOUS | Status: DC | PRN

## 2024-03-19 MED ORDER — CLONAZEPAM 0.5 MG PO TABS: 0.5000 mg | ORAL_TABLET | Freq: Two times a day (BID) | ORAL | Status: DC | PRN

## 2024-03-19 MED ORDER — HYDRALAZINE HCL 20 MG/ML IJ SOLN: 10.0000 mg | INTRAMUSCULAR | Status: DC | PRN

## 2024-03-19 MED ORDER — MAGNESIUM SULFATE 40 GM/1000ML IV SOLN
2.0000 g/h | INTRAVENOUS | Status: AC
  Administered 2024-03-19 – 2024-03-20 (×2): 2 g/h via INTRAVENOUS
  Filled 2024-03-19 (×2): qty 1000

## 2024-03-19 MED ORDER — ACETAMINOPHEN 325 MG PO TABS: 650.0000 mg | ORAL_TABLET | ORAL | Status: DC | PRN

## 2024-03-19 MED ORDER — FERROUS SULFATE 325 (65 FE) MG PO TABS
325.0000 mg | ORAL_TABLET | ORAL | Status: DC
  Administered 2024-03-19 – 2024-03-21 (×2): 325 mg via ORAL
  Filled 2024-03-19 (×2): qty 1

## 2024-03-19 MED ORDER — NIFEDIPINE 10 MG PO CAPS: 20.0000 mg | ORAL_CAPSULE | ORAL | Status: DC | PRN

## 2024-03-19 MED ORDER — MAGNESIUM SULFATE BOLUS VIA INFUSION
4.0000 g | Freq: Once | INTRAVENOUS | Status: AC
  Administered 2024-03-19: 4 g via INTRAVENOUS
  Filled 2024-03-19: qty 1000

## 2024-03-19 MED ORDER — CLONAZEPAM 0.5 MG PO TABS
0.5000 mg | ORAL_TABLET | Freq: Two times a day (BID) | ORAL | Status: DC | PRN
  Administered 2024-03-19 – 2024-03-21 (×4): 0.5 mg via ORAL
  Filled 2024-03-19 (×4): qty 1

## 2024-03-19 MED ORDER — PROGESTERONE 200 MG PO CAPS
200.0000 mg | ORAL_CAPSULE | Freq: Every day | ORAL | Status: DC
  Administered 2024-03-19: 200 mg via VAGINAL
  Filled 2024-03-19: qty 1

## 2024-03-19 MED ORDER — LABETALOL HCL 5 MG/ML IV SOLN
40.0000 mg | INTRAVENOUS | Status: DC | PRN
Start: 2024-03-19 — End: 2024-03-19

## 2024-03-19 MED ORDER — CALCIUM CARBONATE ANTACID 500 MG PO CHEW
2.0000 | CHEWABLE_TABLET | ORAL | Status: DC | PRN
  Administered 2024-03-21: 400 mg via ORAL
  Filled 2024-03-19: qty 2

## 2024-03-19 MED ORDER — PANTOPRAZOLE SODIUM 40 MG PO TBEC
40.0000 mg | DELAYED_RELEASE_TABLET | Freq: Once | ORAL | Status: AC
  Administered 2024-03-19: 40 mg via ORAL
  Filled 2024-03-19: qty 1

## 2024-03-19 MED ORDER — DOCUSATE SODIUM 100 MG PO CAPS
100.0000 mg | ORAL_CAPSULE | Freq: Every day | ORAL | Status: DC
  Administered 2024-03-21: 100 mg via ORAL
  Filled 2024-03-19: qty 1

## 2024-03-19 MED ORDER — METOCLOPRAMIDE HCL 5 MG/ML IJ SOLN
10.0000 mg | Freq: Once | INTRAMUSCULAR | Status: AC
Start: 2024-03-19 — End: 2024-03-19
  Administered 2024-03-19: 10 mg via INTRAVENOUS
  Filled 2024-03-19: qty 2

## 2024-03-19 MED ORDER — METOCLOPRAMIDE HCL 10 MG PO TABS
10.0000 mg | ORAL_TABLET | Freq: Four times a day (QID) | ORAL | Status: DC | PRN
  Administered 2024-03-20: 10 mg via ORAL
  Filled 2024-03-19: qty 1

## 2024-03-19 MED ORDER — LABETALOL HCL 5 MG/ML IV SOLN: 40.0000 mg | INTRAVENOUS | Status: DC | PRN

## 2024-03-19 MED ORDER — LACTATED RINGERS IV SOLN: INTRAVENOUS | Status: AC

## 2024-03-19 MED ORDER — CYCLOBENZAPRINE HCL 5 MG PO TABS
10.0000 mg | ORAL_TABLET | Freq: Two times a day (BID) | ORAL | Status: DC | PRN
  Administered 2024-03-20: 10 mg via ORAL
  Filled 2024-03-19: qty 1

## 2024-03-19 MED ORDER — MAGNESIUM OXIDE -MG SUPPLEMENT 400 (240 MG) MG PO TABS: 400.0000 mg | ORAL_TABLET | Freq: Every day | ORAL | Status: DC

## 2024-03-19 MED ORDER — POLYETHYLENE GLYCOL 3350 17 G PO PACK: 17.0000 g | PACK | Freq: Every day | ORAL | Status: DC | PRN

## 2024-03-19 MED ORDER — PANTOPRAZOLE SODIUM 40 MG PO TBEC
40.0000 mg | DELAYED_RELEASE_TABLET | Freq: Every day | ORAL | Status: DC
  Administered 2024-03-20 – 2024-03-21 (×2): 40 mg via ORAL
  Filled 2024-03-19 (×2): qty 1

## 2024-03-19 MED ORDER — ACETAMINOPHEN-CAFFEINE 500-65 MG PO TABS
2.0000 | ORAL_TABLET | Freq: Four times a day (QID) | ORAL | Status: DC | PRN
  Administered 2024-03-19 – 2024-03-20 (×2): 2 via ORAL
  Filled 2024-03-19 (×3): qty 2

## 2024-03-19 MED ORDER — BETAMETHASONE SOD PHOS & ACET 6 (3-3) MG/ML IJ SUSP
12.0000 mg | INTRAMUSCULAR | Status: AC
  Administered 2024-03-19 – 2024-03-20 (×2): 12 mg via INTRAMUSCULAR
  Filled 2024-03-19: qty 5

## 2024-03-19 MED ORDER — HYDROXYZINE HCL 10 MG PO TABS: 10.0000 mg | ORAL_TABLET | Freq: Three times a day (TID) | ORAL | Status: DC | PRN

## 2024-03-19 MED ORDER — DIPHENHYDRAMINE HCL 50 MG/ML IJ SOLN
25.0000 mg | Freq: Once | INTRAMUSCULAR | Status: AC
  Administered 2024-03-19: 25 mg via INTRAVENOUS
  Filled 2024-03-19: qty 1

## 2024-03-19 MED ORDER — LACTATED RINGERS IV SOLN: 125.0000 mL/h | INTRAVENOUS | Status: AC

## 2024-03-19 MED ORDER — NICOTINE 21 MG/24HR TD PT24
21.0000 mg | MEDICATED_PATCH | Freq: Every day | TRANSDERMAL | Status: DC
  Administered 2024-03-19 – 2024-03-21 (×3): 21 mg via TRANSDERMAL
  Filled 2024-03-19 (×6): qty 1

## 2024-03-19 MED ORDER — LABETALOL HCL 5 MG/ML IV SOLN: 20.0000 mg | INTRAVENOUS | Status: DC | PRN

## 2024-03-19 MED ORDER — PRENATAL MULTIVITAMIN CH
1.0000 | ORAL_TABLET | Freq: Every day | ORAL | Status: DC
  Administered 2024-03-20 – 2024-03-21 (×2): 1 via ORAL
  Filled 2024-03-19 (×2): qty 1

## 2024-03-19 MED ORDER — NIFEDIPINE 10 MG PO CAPS
10.0000 mg | ORAL_CAPSULE | ORAL | Status: DC | PRN
  Administered 2024-03-19: 10 mg via ORAL
  Filled 2024-03-19: qty 1

## 2024-03-19 NOTE — MAU Provider Note (Signed)
 Chief Complaint:  Hypertension, Contractions, Headache, and Decreased Fetal Movement   HPI    Paula Dominguez is a 43 y.o. [id number] at [redacted]w[redacted]d who presents to maternity admissions reporting a severe HA after taking Fioricet and Flexeril today at 2 Noon with no resolve. She is also on Mag Oxide daily with Reglan for HA's. She has a h/o Migraines and is awaiting Neurology - appointment scheduled beginning of April. She reports her HA to be    on pain scale. She is a cHTN on Labetalol 400 TID recently increased on 03/14/24 and reports compliance. She also has severe anxiety no longer responding to Atarax. She was seen in office today and call received by Dr Grace Bushy (MFM) for BPP/NST. Results of BPP were 8/8 and NST was non -reactive per MFM report.  Pregnancy Course: [facility]/ MFM  Past Medical History:  Diagnosis Date   Anemia    Anxiety and depression    Blood transfusion without reported diagnosis    2014, 2018   GERD (gastroesophageal reflux disease)    History of preterm delivery, currently pregnant 01/30/2024   Hx of preeclampsia, prior pregnancy, currently pregnant    prior pregnancy   Hypertension    OB History  Gravida Para Term Preterm AB Living  11 8 7 1 2 7   SAB IAB Ectopic Multiple Live Births  2 0 0 0 8    # Outcome Date GA Lbr Len/2nd Weight Sex Type Anes PTL Lv  11 Current           10 SAB 2023          9 Preterm 07/08/21 [redacted]w[redacted]d / 00:04 2405 g F Vag-Spont EPI  DEC  8 Term 11/10/18 [redacted]w[redacted]d 08:49 / 00:03 2625 g F Vag-Spont EPI  LIV  7 Term 11/20/13 [redacted]w[redacted]d 06:10 / 00:12 2920 g M Vag-Spont EPI, Local  LIV  6 Term 12/23/10 [redacted]w[redacted]d  2722 g M Vag-Spont EPI  LIV     Birth Comments: preeclampsia  5 Term 03/29/06 [redacted]w[redacted]d  3175 g F Vag-Spont   LIV  4 SAB 2006 [redacted]w[redacted]d         3 Term 10/22/02 [redacted]w[redacted]d  3175 g M Vag-Spont   LIV  2 Term 08/10/98 [redacted]w[redacted]d  3175 g F Vag-Spont EPI  LIV  1 Term 03/23/95 [redacted]w[redacted]d  3175 g M Vag-Spont EPI  LIV   Past Surgical History:  Procedure Laterality  Date   HEMORRHOID SURGERY     Family History  Problem Relation Age of Onset   Cancer Mother    Healthy Mother    Heart disease Father    Healthy Father    Asthma Daughter    Asthma Son    Hypertension Maternal Grandmother    Diabetes Maternal Grandmother    Cancer Maternal Grandmother    Cancer Maternal Grandfather    Cancer Paternal Grandmother    Cancer Paternal Grandfather    Hypertension Other    Cancer Other    Social History   Tobacco Use   Smoking status: Former    Current packs/day: 0.00    Average packs/day: 0.3 packs/day for 17.0 years (4.3 ttl pk-yrs)    Types: Cigarettes    Start date: 07/27/1996    Quit date: 07/27/2013    Years since quitting: 10.6   Smokeless tobacco: Never   Tobacco comments:    3 cigs a day  Vaping Use   Vaping status: Former   Quit date: 10/14/2023   Substances: Nicotine, Flavoring  Substance Use Topics   Alcohol use: No   Drug use: No   No Known Allergies Medications Prior to Admission  Medication Sig Dispense Refill Last Dose/Taking   aspirin 81 MG chewable tablet Chew 1 tablet (81 mg total) by mouth daily. 30 tablet 4 03/19/2024   butalbital-acetaminophen-caffeine (FIORICET) 50-325-40 MG tablet Take 1 tablet by mouth 2 (two) times daily as needed for headache.   03/19/2024 Noon   cyclobenzaprine (FLEXERIL) 10 MG tablet Take 1 tablet (10 mg total) by mouth 2 (two) times daily as needed for muscle spasms. 20 tablet 2 03/19/2024 Noon   labetalol (NORMODYNE) 200 MG tablet Take 2 tablets (400 mg total) by mouth 3 (three) times daily. 180 tablet 1 03/19/2024   magnesium oxide (MAG-OX) 400 MG tablet Take 1 tablet (400 mg total) by mouth daily. 90 tablet 0 03/19/2024   omeprazole (PRILOSEC) 40 MG capsule Take 1 capsule (40 mg total) by mouth in the morning and at bedtime. 60 capsule 5 03/19/2024   Prenatal Vit-Fe Fumarate-FA (PNV PRENATAL PLUS MULTIVITAMIN) 27-1 MG TABS Take 1 capsule by mouth daily. 90 tablet 6 03/19/2024   acetaminophen-caffeine  (EXCEDRIN TENSION HEADACHE) 500-65 MG TABS per tablet Take 2 tablets by mouth every 6 (six) hours as needed (headache, aches and pains, fever). 60 tablet 0    clonazePAM (KLONOPIN) 0.5 MG tablet Take 1 tablet (0.5 mg total) by mouth 2 (two) times daily as needed for anxiety. 30 tablet 3    ferrous sulfate 325 (65 FE) MG tablet Take 1 tablet (325 mg total) by mouth every other day. 15 tablet 0    hydrOXYzine (ATARAX) 10 MG tablet Take 1 tablet (10 mg total) by mouth 3 (three) times daily as needed for anxiety. 30 tablet 1    metoCLOPramide (REGLAN) 10 MG tablet Take 1 tablet (10 mg total) by mouth every 6 (six) hours. 30 tablet 0    Misc. Devices (WRIST BRACE) MISC 2 Devices by Does not apply route as needed. (Patient not taking: Reported on 03/12/2024) 2 each 0    Misc. Devices MISC 1 Device by Does not apply route as needed. Ergonomic keyboard for carpal tunnel syndrome 1 each 0    Nicotine 21-14-7 MG/24HR KIT Place 1 kit onto the skin as directed. Use as directed 1 kit 0    nicotine polacrilex (COMMIT) 4 MG lozenge Take 1 lozenge (4 mg total) by mouth as needed for smoking cessation. (Patient not taking: Reported on 03/12/2024) 100 tablet 0    polyethylene glycol powder (GLYCOLAX/MIRALAX) 17 GM/SCOOP powder Take 17 g by mouth daily. 510 g 2    progesterone (PROMETRIUM) 200 MG capsule Place one capsule vaginally at bedtime 30 capsule 3     I have reviewed patient's Past Medical Hx, Surgical Hx, Family Hx, Social Hx, medications and allergies.   ROS  Pertinent items noted in HPI and remainder of comprehensive ROS otherwise negative.   PHYSICAL EXAM  Patient Vitals for the past 24 hrs:  BP Temp Temp src Pulse SpO2 Height Weight  03/19/24 1729 (!) 150/81 -- -- 89 -- -- --  03/19/24 1701 (!) 163/94 -- -- 98 -- -- --  03/19/24 1650 (!) 152/110 -- -- (!) 104 96 % -- --  03/19/24 1616 (!) 154/99 98.9 F (37.2 C) Oral 100 98 % -- --  03/19/24 1554 -- -- -- -- -- 5\' 7"  (1.702 m) 107.2 kg     Constitutional: Well-developed, well-nourished female in no acute distress.  Cardiovascular: normal rate &  rhythm, warm and well-perfused Respiratory: normal effort, no problems with respiration noted GI: Abd soft, non-tender, gravid MS: Extremities nontender, no edema, normal ROM Neurologic: Alert and oriented x 4.  GU: no CVA tenderness Pelvic: NEFG, physiologic discharge, no blood, cervix clean.      Fetal Tracing: @ 1700 Baseline: 150-155 Variability:moderate  Accelerations: present Decelerations:absent Toco: irregular    Labs: Results for orders placed or performed during the hospital encounter of 03/19/24 (from the past 24 hours)  Protein / creatinine ratio, urine     Status: None   Collection Time: 03/19/24  3:52 PM  Result Value Ref Range   Creatinine, Urine 60 mg/dL   Total Protein, Urine <6 mg/dL   Protein Creatinine Ratio        0.00 - 0.15 mg/mg[Cre]  CBC     Status: Abnormal   Collection Time: 03/19/24  4:45 PM  Result Value Ref Range   WBC 11.4 (H) 4.0 - 10.5 K/uL   RBC 3.07 (L) 3.87 - 5.11 MIL/uL   Hemoglobin 10.2 (L) 12.0 - 15.0 g/dL   HCT 43.3 (L) 29.5 - 18.8 %   MCV 96.4 80.0 - 100.0 fL   MCH 33.2 26.0 - 34.0 pg   MCHC 34.5 30.0 - 36.0 g/dL   RDW 41.6 60.6 - 30.1 %   Platelets 228 150 - 400 K/uL   nRBC 0.0 0.0 - 0.2 %    Imaging:  [place] MFM FETAL BPP WO NON STRESS Result Date: 03/19/2024 ----------------------------------------------------------------------  OBSTETRICS REPORT                    (Corrected Final 03/19/2024 04:46 pm) ---------------------------------------------------------------------- Patient Info  ID #:       [id number]                          D.O.B.:  03-11-1981 (42 yrs)(F)  Name:       Paula Dominguez                 Visit Date: 03/19/2024 01:18 pm              [place] ---------------------------------------------------------------------- Performed By  Attending:        Lin Landsman      Secondary Phy.:   [facility]                     MD  Performed By:     Dennis Bast RDMS      Address:          [address]                                                             [address]                                                             [place], [place]  [address]  Referred By:      Ivory Broad DAVIS          Location[facility]         [facility]                    MD                                       [facility]  Ref. Address:     [address]                    [place], [place]                    [address] ---------------------------------------------------------------------- Orders  #  Description                           Code        Ordered By  1  [place] MFM FETAL BPP WO NON               76819.01    RAVI [facility]     STRESS ----------------------------------------------------------------------  #  Order #                     Accession #                Episode #  1  [id number]                   [id number]                 [id number] ---------------------------------------------------------------------- Indications  Advanced maternal age multigravida 1+,        O63.523  third trimester (42 yrs)  Hypertension - Chronic/Pre-existing            O10.019  (labetalol)  Obesity complicating pregnancy, third          O99.213  trimester (BMI 36)  Grand multiparity, antepartum                  O09.40  Other mental disorder complicating             O99.340  pregnancy, third trimester  Poor obstetric history: Previous preterm       O09.219  delivery, antepartum  Poor obstetric history: Previous               O09.299  preeclampsia / eclampsia/gestational HTN  [redacted] weeks gestation of pregnancy                Z3A.32 ---------------------------------------------------------------------- Vital Signs  BP:  154/90  ---------------------------------------------------------------------- Fetal Evaluation  Num Of Fetuses:         1  Fetal Heart Rate(bpm):  155  Cardiac Activity:       Observed  Presentation:           Cephalic  Placenta:               Posterior  Amniotic Fluid  AFI FV:      Within normal limits  AFI Sum(cm)     %Tile       Largest Pocket(cm)  11.64           30          4.37  RUQ(cm)       RLQ(cm)       LUQ(cm)        LLQ(cm)  4.37          2.51          2.7            2.06 ---------------------------------------------------------------------- Biophysical Evaluation  Amniotic F.V:   Within normal limits       F. Tone:        Observed  F. Movement:    Observed                   N.S.T:          Equivocal  F. Breathing:   Observed                   Score:          8/10 ---------------------------------------------------------------------- OB History  Blood Type:   O+  Gravidity:    11        Term:   7        Prem:   1        SAB:   2  Living:       8 ---------------------------------------------------------------------- Gestational Age  LMP:           35w 2d        Date:  07/16/23                  EDD:   04/21/24  Best:          Paula Dominguez 5d     Det. By:  Paula Dubs         EDD:   05/09/24                                      (09/15/23) ---------------------------------------------------------------------- Anatomy  Cranium:               Previously seen        Aortic Arch:            Previously seen  Cavum:                 Previously seen        Ductal Arch:            Previously seen  Ventricles:            Appears normal         Diaphragm:              Appears normal  Choroid Plexus:        Previously seen        Stomach:  Appears normal, left                                                                        sided  Cerebellum:            Previously seen        Abdomen:                Previously seen  Posterior Fossa:       Previously seen        Abdominal Wall:         Previously seen  Face:                   Orbits and profile     Cord Vessels:           Previously seen                         previously seen  Lips:                  Previously seen        Kidneys:                Appear normal  Thoracic:              Previously seen        Bladder:                Appears normal  Heart:                 Appears normal         Spine:                  Previously seen                         (4CH, axis, and                         situs)  RVOT:                  Previously seen        Upper Extremities:      Previously seen  LVOT:                  Previously seen        Lower Extremities:      Previously seen ---------------------------------------------------------------------- Cervix Uterus Adnexa  Cervix  Not visualized (advanced GA >24wks)  Uterus  No abnormality visualized.  Right Ovary  Not visualized.  Left Ovary  Not visualized.  Cul De Sac  No free fluid seen.  Adnexa  No abnormality visualized ---------------------------------------------------------------------- Impression  MFM Brief consultation  Paula Dominguez is a 43 yo G11P7 who is 68 w 5d who is seen  today at the request of Dr. Merian Capron.  She is monitored for chronic hypertension now on 400 mg  TID Labetalol. Today she complains that she is overall not  feeling well. She has a frontal headache as well that has  persisted.  She was seen in the MAU on 03/14/24 her PIH labs were  normal.           03/19/2024    4:16 PM       03/19/2024    3:54 PM  PM       03/19/2024    1:43 PM  Vitals with BMI  Height            5\' 7"   Weight            236 lbs 5 oz  BMI               37  Systolic 154               148  Diastolic99                97  Pulse    100               102  Imaging:  BPP 8/8 however given that she was concerned for  decreased fetal movement and uterine contractions an NST  was performed that was reassuring but not reactive.  Impression/Counseling:  Given Paula Dominguez's symptoms of headache and overall  malaise with elevated blood pressure and  a non-reactive but  reassuring NST.  I have recommended that she go to the MAU for prolonged  monitoring, labs and headache treatment.  If her headache doesn't resolve consider inpatient  observation and BMZ administration.  If maternal symptoms persist and fetal status is not optimal or  equivocal consider delivery  34.  I discussed the plan of care with Marcell Barlow NP  I spent 45 minutes with >50% in consultation, medical record  review an care coordination. ---------------------------------------------------------------------- Recommendations  TO MAU ----------------------------------------------------------------------                   Lin Landsman, MD Electronically Signed Corrected Final Report  03/19/2024 04:46 pm ----------------------------------------------------------------------    MDM & MAU COURSE  MDM:  MAU Course: Orders Placed This Encounter  Procedures   CBC   Comprehensive metabolic panel   Protein / creatinine ratio, urine   Maintain IV access   Notify physician (specify) Confirmatory reading of BP> 160/110 15 minutes later   Apply Hypertensive Disorders of Pregnancy Care Plan   Measure blood pressure   Meds ordered this encounter  Medications   metoCLOPramide (REGLAN) injection 10 mg   diphenhydrAMINE (BENADRYL) injection 25 mg   AND Linked Order Group    NIFEdipine (PROCARDIA) capsule 10 mg    NIFEdipine (PROCARDIA) capsule 20 mg    NIFEdipine (PROCARDIA) capsule 20 mg    labetalol (NORMODYNE) injection 40 mg   betamethasone acetate-betamethasone sodium phosphate (CELESTONE) injection 12 mg    ASSESSMENT   1. Severe pre-eclampsia in third trimester   2. Nonintractable headache, unspecified chronicity pattern, unspecified headache type     PLAN   Admit to OBS- Superimposed Preeclampsia with Severe Features - Antenatal steroids - Magnesium for seizure ppx - Plan of Care and admission discussed with  Dr Despina Hidden Dr Berton Lan   Marcell Barlow, MSN,  [facility], [facility]

## 2024-03-19 NOTE — MAU Note (Signed)
.  Fanny Agan is a 43 y.o. at [redacted]w[redacted]d here in MAU reporting: received report from provider that patient was coming over for non-reactive NST - BPP was 8/8. Patient reports office sent her with concern for her BP and contractions that were tracing on the monitor. Pt has CHTN and is taking Labetalol 3x/day. She has been having an on-going headache (8/10) and visual changes. She also reports on-going contractions for the past 4 weeks - she is unsure of the frequency of ctx (9/10). Reports that ctx have not changed since she was last seen, she does report frustration that the contractions have been "constant" and nothing every helps.Denies VB or LOF. Reports DFM today.  Patient voices frustration wanting to deliver now and that she feels like something is wrong. RN reassured patient that we are taking the necessary steps today to assess the well being of her and her baby. RN also informed patient that unfortunately this is a very delicate decision making process in determining when to deliver a premature baby. RN assured patient that we are taking the steps necessary to determine fetal well being at this time and that the providers will develop a plan with the patient based off of the information that we gain from today.  PIH Assessment: Headache present: Yes ;Has not responded to treatment Visual disturbances: Blurred and Floaters RUQ pain/Epigastric: None Atypical edema: None Hx of HBP: CHTN BP Medications: Labetalol 400 mg TID   Onset of complaint: On-going There were no vitals filed for this visit.   FHT: 160  Lab orders placed from triage: UA

## 2024-03-19 NOTE — MAU Note (Signed)
RN called lab to inquire about CBC and Comprehensive Metabolic Panel. Lab tech answered and informed RN that that they have received sample and will process now.Marland Kitchen

## 2024-03-19 NOTE — Procedures (Signed)
 Paula Dominguez December 01, 1981 [redacted]w[redacted]d  Fetus A Non-Stress Test Interpretation for 03/19/24  Indication: IUGR, Failed BPP  Fetal Heart Rate A Mode: External Baseline Rate (A): 140 bpm Variability: Moderate Accelerations: 15 x 15 Decelerations: None Multiple birth?: No  Uterine Activity Mode: Toco, Palpation Contraction Frequency (min): Irregular Contraction Duration (sec): 70-90 Contraction Quality: Mild (barely palpable by RN. Pt c/o 8/10 pain in lower uterine segment across front of abdomen. Pt became visibly uncomfortable corresponding with ctx on monitor) Resting Tone Palpated: Relaxed  Interpretation (Fetal Testing) Comments: Reviewed with Dr. Grace Bushy

## 2024-03-19 NOTE — Progress Notes (Unsigned)
 MFM Brief consultation  Paula Dominguez is a 43 yo G11P7 who is 55 w 5d who is seen today at the request of Dr. Merian Capron.  She is monitored for chronic hypertension now on 400 mg TID Labetalol. Today she complains that she is overall not feeling well. She has a frontal headache as well that has persisted.  She was seen in the MAU on 03/14/24 her PIH labs were normal.      03/19/2024    4:16 PM 03/19/2024    3:54 PM 03/19/2024    1:43 PM  Vitals with BMI  Height  5\' 7"    Weight  236 lbs 5 oz   BMI  37   Systolic 154  148  Diastolic 99  97  Pulse 100  102   Imaging: BPP 8/8 however given that she was concerned for decreased fetal movement and uterine contractions an NST was performed that was reassuring but not reactive.   Impression/Counseling:  Given Paula Dominguez's symptoms of headache and overall malaise with elevated blood pressure and a non-reactive but reassuring NST.  I have recommended that she go to the MAU for prolonged monitoring, labs and headache treatment.  If her headache doesn't resolve consider inpatient observation and BMZ administration.  If maternal symptoms persist and fetal status is not optimal or equivocal consider delivery ~ 34.   I discussed the plan of care with Paula Dominguez  I spent 45 minutes with >50% in consultation, medical record review an care coordination.

## 2024-03-20 DIAGNOSIS — Z3A32 32 weeks gestation of pregnancy: Secondary | ICD-10-CM

## 2024-03-20 DIAGNOSIS — O113 Pre-existing hypertension with pre-eclampsia, third trimester: Secondary | ICD-10-CM

## 2024-03-20 LAB — CBC
HCT: 29.5 % — ABNORMAL LOW (ref 36.0–46.0)
Hemoglobin: 10.2 g/dL — ABNORMAL LOW (ref 12.0–15.0)
MCH: 33.6 pg (ref 26.0–34.0)
MCHC: 34.6 g/dL (ref 30.0–36.0)
MCV: 97 fL (ref 80.0–100.0)
Platelets: 242 10*3/uL (ref 150–400)
RBC: 3.04 MIL/uL — ABNORMAL LOW (ref 3.87–5.11)
RDW: 13.8 % (ref 11.5–15.5)
WBC: 13.6 10*3/uL — ABNORMAL HIGH (ref 4.0–10.5)
nRBC: 0 % (ref 0.0–0.2)

## 2024-03-20 LAB — COMPREHENSIVE METABOLIC PANEL
ALT: 12 U/L (ref 0–44)
AST: 20 U/L (ref 15–41)
Albumin: 2.9 g/dL — ABNORMAL LOW (ref 3.5–5.0)
Alkaline Phosphatase: 65 U/L (ref 38–126)
Anion gap: 10 (ref 5–15)
BUN: 5 mg/dL — ABNORMAL LOW (ref 6–20)
CO2: 19 mmol/L — ABNORMAL LOW (ref 22–32)
Calcium: 7.8 mg/dL — ABNORMAL LOW (ref 8.9–10.3)
Chloride: 105 mmol/L (ref 98–111)
Creatinine, Ser: 0.58 mg/dL (ref 0.44–1.00)
GFR, Estimated: >60 mL/min (ref >60)
Glucose, Bld: 163 mg/dL — ABNORMAL HIGH (ref 70–99)
Potassium: 3.7 mmol/L (ref 3.5–5.1)
Sodium: 134 mmol/L — ABNORMAL LOW (ref 135–145)
Total Bilirubin: 0.3 mg/dL (ref 0.0–1.2)
Total Protein: 6.3 g/dL — ABNORMAL LOW (ref 6.5–8.1)

## 2024-03-20 MED ORDER — DIPHENHYDRAMINE HCL 25 MG PO CAPS
25.0000 mg | ORAL_CAPSULE | Freq: Four times a day (QID) | ORAL | Status: DC | PRN
  Administered 2024-03-20: 25 mg via ORAL
  Filled 2024-03-20: qty 1

## 2024-03-20 MED ORDER — METOCLOPRAMIDE HCL 10 MG PO TABS
10.0000 mg | ORAL_TABLET | Freq: Four times a day (QID) | ORAL | Status: AC
  Administered 2024-03-20 – 2024-03-21 (×4): 10 mg via ORAL
  Filled 2024-03-20 (×4): qty 1

## 2024-03-20 MED ORDER — DIPHENHYDRAMINE HCL 25 MG PO CAPS
25.0000 mg | ORAL_CAPSULE | Freq: Four times a day (QID) | ORAL | Status: AC
  Administered 2024-03-20 – 2024-03-21 (×4): 25 mg via ORAL
  Filled 2024-03-20 (×4): qty 1

## 2024-03-20 MED ORDER — BUTALBITAL-APAP-CAFFEINE 50-325-40 MG PO TABS
1.0000 | ORAL_TABLET | Freq: Three times a day (TID) | ORAL | Status: DC | PRN
  Administered 2024-03-20: 2 via ORAL
  Administered 2024-03-20: 1 via ORAL
  Administered 2024-03-21 (×2): 2 via ORAL
  Filled 2024-03-20: qty 2
  Filled 2024-03-20: qty 1
  Filled 2024-03-20 (×2): qty 2

## 2024-03-20 MED ORDER — ACETAMINOPHEN 500 MG PO TABS: 1000.0000 mg | ORAL_TABLET | Freq: Four times a day (QID) | ORAL | Status: DC | PRN

## 2024-03-20 NOTE — Progress Notes (Signed)
 FACULTY PRACTICE ANTEPARTUM COMPREHENSIVE PROGRESS NOTE  Paula Dominguez is a 43 y.o. [id number] at [redacted]w[redacted]d who is admitted for SIPE w/ SF (BP).  Estimated Date of Delivery: 05/09/24 Fetal presentation is cephalic.  Length of Stay:  1 Days. Admitted 03/19/2024  Subjective: Reports headache persists but is down to a 5 out of 10 more consistent with her baseline. Would like to try another round of benadryl/reglan.  She notes chest pressure beneath her ribs that started immediately prior to her admission. Initially attributed it to her anxiety and it got better but then came back.  Has heartburn c/w baseline.  Is overall just feeling fatigued/unwell since starting magnesium.  No ctx, VB, LOF. +FM  Vitals:  Blood pressure 127/63, pulse 84, temperature 97.8 F (36.6 C), temperature source Oral, resp. rate 18, height 5\' 7"  (1.702 m), weight 106.6 kg, last menstrual period 07/16/2023, SpO2 96%, unknown if currently breastfeeding. Physical Examination: CONSTITUTIONAL: Well-developed, well-nourished female in no acute distress.  NEUROLOGIC: Alert and oriented to person, place, and time. No cranial nerve deficit noted. CARDIOVASCULAR: Normal heart rate noted RESPIRATORY: Effort normal, no problems with respiration noted MUSCULOSKELETAL: Pitting pedal edema to ankles ABDOMEN: Soft, nontender, nondistended, gravid.  Fetal monitoring: FHR: 130 bpm, Variability: moderate with periods of minimal, Accelerations: Absent, Decelerations: Absent  Uterine activity: flat  Results for orders placed or performed during the hospital encounter of 03/19/24 (from the past 48 hours)  Protein / creatinine ratio, urine     Status: None   Collection Time: 03/19/24  3:52 PM  Result Value Ref Range   Creatinine, Urine 60 mg/dL   Total Protein, Urine <6 mg/dL    Comment: NO NORMAL RANGE ESTABLISHED FOR THIS TEST   Protein Creatinine Ratio        0.00 - 0.15 mg/mg[Cre]    Comment: RESULT BELOW REPORTABLE  RANGE, UNABLE TO CALCULATE. Performed at [facility], [address], [place], [place] [address]   CBC     Status: Abnormal   Collection Time: 03/19/24  4:45 PM  Result Value Ref Range   WBC 11.4 (H) 4.0 - 10.5 K/uL   RBC 3.07 (L) 3.87 - 5.11 MIL/uL   Hemoglobin 10.2 (L) 12.0 - 15.0 g/dL   HCT 11.9 (L) 14.7 - 82.9 %   MCV 96.4 80.0 - 100.0 fL   MCH 33.2 26.0 - 34.0 pg   MCHC 34.5 30.0 - 36.0 g/dL   RDW 56.2 13.0 - 86.5 %   Platelets 228 150 - 400 K/uL   nRBC 0.0 0.0 - 0.2 %    Comment: Performed at [facility], [address], [place], [place] [address]  Comprehensive metabolic panel     Status: Abnormal   Collection Time: 03/19/24  4:45 PM  Result Value Ref Range   Sodium 135 135 - 145 mmol/L   Potassium 3.4 (L) 3.5 - 5.1 mmol/L   Chloride 106 98 - 111 mmol/L   CO2 19 (L) 22 - 32 mmol/L   Glucose, Bld 87 70 - 99 mg/dL    Comment: Glucose reference range applies only to samples taken after fasting for at least 8 hours.   BUN <5 (L) 6 - 20 mg/dL   Creatinine, Ser 6.29 0.44 - 1.00 mg/dL   Calcium 9.0 8.9 - 52.8 mg/dL   Total Protein 6.3 (L) 6.5 - 8.1 g/dL   Albumin 2.9 (L) 3.5 - 5.0 g/dL   AST 17 15 - 41 U/L   ALT 12 0 -  44 U/L   Alkaline Phosphatase 60 38 - 126 U/L   Total Bilirubin 0.5 0.0 - 1.2 mg/dL   GFR, Estimated >65 >78 mL/min    Comment: (NOTE) Calculated using the CKD-EPI Creatinine Equation (2021)    Anion gap 10 5 - 15    Comment: Performed at [facility], [address] [place], [place] [address]  Type and screen [facility]     Status: None   Collection Time: 03/19/24  4:45 PM  Result Value Ref Range   ABO/RH(D) O POS    Antibody Screen NEG    Sample Expiration      03/22/2024,2359 Performed at [facility], [address] [place], [place] [address]   Comprehensive metabolic panel     Status: Abnormal   Collection Time: 03/20/24  5:03 AM  Result Value Ref Range   Sodium 134 (L) 135 - 145 mmol/L    Potassium 3.7 3.5 - 5.1 mmol/L   Chloride 105 98 - 111 mmol/L   CO2 19 (L) 22 - 32 mmol/L   Glucose, Bld 163 (H) 70 - 99 mg/dL    Comment: Glucose reference range applies only to samples taken after fasting for at least 8 hours.   BUN 5 (L) 6 - 20 mg/dL   Creatinine, Ser 1.32 0.44 - 1.00 mg/dL   Calcium 7.8 (L) 8.9 - 10.3 mg/dL   Total Protein 6.3 (L) 6.5 - 8.1 g/dL   Albumin 2.9 (L) 3.5 - 5.0 g/dL   AST 20 15 - 41 U/L   ALT 12 0 - 44 U/L   Alkaline Phosphatase 65 38 - 126 U/L   Total Bilirubin 0.3 0.0 - 1.2 mg/dL   GFR, Estimated >44 >01 mL/min    Comment: (NOTE) Calculated using the CKD-EPI Creatinine Equation (2021)    Anion gap 10 5 - 15    Comment: Performed at [facility], [address], [place], [place] [address]  CBC     Status: Abnormal   Collection Time: 03/20/24  5:03 AM  Result Value Ref Range   WBC 13.6 (H) 4.0 - 10.5 K/uL   RBC 3.04 (L) 3.87 - 5.11 MIL/uL   Hemoglobin 10.2 (L) 12.0 - 15.0 g/dL   HCT 36.6 (L) 44.0 - 34.7 %   MCV 97.0 80.0 - 100.0 fL   MCH 33.6 26.0 - 34.0 pg   MCHC 34.6 30.0 - 36.0 g/dL   RDW 42.5 95.6 - 38.7 %   Platelets 242 150 - 400 K/uL   nRBC 0.0 0.0 - 0.2 %    Comment: Performed at [facility], [address] [place], [place] [address]    [place] MFM FETAL BPP WO NON STRESS Result Date: 03/19/2024 ----------------------------------------------------------------------  OBSTETRICS REPORT                    (Corrected Final 03/19/2024 04:46 pm) ---------------------------------------------------------------------- Patient Info  ID #:       [id number]                          D.O.B.:  [birth year] (42 yrs)(F)  Name:       Paula Dominguez                 Visit Date: 03/19/2024 01:18 pm              [place] ---------------------------------------------------------------------- Performed By  Attending:  Corenthian Booker      Secondary Phy.:   [facility]                    MD  Performed By:     Dennis Bast RDMS      Address:          [address]                                                             [place], [place]                                                             [address]  Referred By:      Conan Bowens          Location[facility]         [facility]                    MD                                       [facility]  Ref. Address:     [address]                    [place], [place]                    [address] ---------------------------------------------------------------------- Orders  #  Description                           Code        Ordered By  1  [place] MFM FETAL BPP WO NON               10272.53    RAVI SHANKAR     STRESS ----------------------------------------------------------------------  #  Order #  Accession #                Episode #  1  [id number]                   [id number]                 [id number] ---------------------------------------------------------------------- Indications  Advanced maternal age multigravida 61+,        O39.523  third trimester (42 yrs)  Hypertension - Chronic/Pre-existing            O10.019  (labetalol)  Obesity complicating pregnancy, third          O99.213  trimester (BMI 36)  Grand multiparity, antepartum                  O09.40  Other mental disorder complicating             O99.340  pregnancy, third trimester  Poor obstetric history: Previous preterm       O09.219  delivery, antepartum  Poor obstetric history: Previous               O09.299  preeclampsia / eclampsia/gestational HTN  [redacted] weeks gestation of pregnancy                Z3A.32 ---------------------------------------------------------------------- Vital Signs  BP:          154/90 ---------------------------------------------------------------------- Fetal Evaluation  Num Of Fetuses:         1  Fetal  Heart Rate(bpm):  155  Cardiac Activity:       Observed  Presentation:           Cephalic  Placenta:               Posterior  Amniotic Fluid  AFI FV:      Within normal limits  AFI Sum(cm)     %Tile       Largest Pocket(cm)  11.64           30          4.37  RUQ(cm)       RLQ(cm)       LUQ(cm)        LLQ(cm)  4.37          2.51          2.7            2.06 ---------------------------------------------------------------------- Biophysical Evaluation  Amniotic F.V:   Within normal limits       F. Tone:        Observed  F. Movement:    Observed                   N.S.T:          Equivocal  F. Breathing:   Observed                   Score:          8/10 ---------------------------------------------------------------------- OB History  Blood Type:   O+  Gravidity:    11        Term:   7        Prem:   1        SAB:   2  Living:       8 ---------------------------------------------------------------------- Gestational Age  LMP:           35w 2d        Date:  07/16/23  EDD:   04/21/24  Best:          32w 5d     Det. ByMarcella Dubs         EDD:   05/09/24                                      (09/15/23) ---------------------------------------------------------------------- Anatomy  Cranium:               Previously seen        Aortic Arch:            Previously seen  Cavum:                 Previously seen        Ductal Arch:            Previously seen  Ventricles:            Appears normal         Diaphragm:              Appears normal  Choroid Plexus:        Previously seen        Stomach:                Appears normal, left                                                                        sided  Cerebellum:            Previously seen        Abdomen:                Previously seen  Posterior Fossa:       Previously seen        Abdominal Wall:         Previously seen  Face:                  Orbits and profile     Cord Vessels:           Previously seen                         previously seen  Lips:                   Previously seen        Kidneys:                Appear normal  Thoracic:              Previously seen        Bladder:                Appears normal  Heart:                 Appears normal         Spine:                  Previously seen                         (  4CH, axis, and                         situs)  RVOT:                  Previously seen        Upper Extremities:      Previously seen  LVOT:                  Previously seen        Lower Extremities:      Previously seen ---------------------------------------------------------------------- Cervix Uterus Adnexa  Cervix  Not visualized (advanced GA >24wks)  Uterus  No abnormality visualized.  Right Ovary  Not visualized.  Left Ovary  Not visualized.  Cul De Sac  No free fluid seen.  Adnexa  No abnormality visualized ---------------------------------------------------------------------- Impression  MFM Brief consultation  Ms. Bousquet is a 43 yo G11P7 who is 22 w 5d who is seen  today at the request of Dr. Merian Capron.  She is monitored for chronic hypertension now on 400 mg  TID Labetalol. Today she complains that she is overall not  feeling well. She has a frontal headache as well that has  persisted.  She was seen in the MAU on 03/14/24 her PIH labs were  normal.           03/19/2024    4:16 PM       03/19/2024    3:54 PM  PM       03/19/2024    1:43 PM  Vitals with BMI  Height            5\' 7"   Weight            236 lbs 5 oz  BMI               37  Systolic 154               148  Diastolic99                97  Pulse    100               102  Imaging:  BPP 8/8 however given that she was concerned for  decreased fetal movement and uterine contractions an NST  was performed that was reassuring but not reactive.  Impression/Counseling:  Given Ms. Schwabe's symptoms of headache and overall  malaise with elevated blood pressure and a non-reactive but  reassuring NST.  I have recommended that she go to the MAU for prolonged  monitoring, labs and  headache treatment.  If her headache doesn't resolve consider inpatient  observation and BMZ administration.  If maternal symptoms persist and fetal status is not optimal or  equivocal consider delivery  34.  I discussed the plan of care with Marcell Barlow NP  I spent 45 minutes with >50% in consultation, medical record  review an care coordination. ---------------------------------------------------------------------- Recommendations  TO MAU ----------------------------------------------------------------------                   Lin Landsman, MD Electronically Signed Corrected Final Report  03/19/2024 04:46 pm ----------------------------------------------------------------------    Current scheduled medications  betamethasone acetate-betamethasone sodium phosphate  12 mg Intramuscular Q24 Hr x 2   diphenhydrAMINE  25 mg Oral Q6H   docusate sodium  100 mg Oral Daily   ferrous sulfate  325 mg Oral QODAY   labetalol  600 mg  Oral Q8H   magnesium oxide  400 mg Oral Daily   metoCLOPramide  10 mg Oral Q6H   nicotine  21 mg Transdermal Daily   pantoprazole  40 mg Oral Daily   prenatal multivitamin  1 tablet Oral Q1200    I have reviewed the patient's current medications.  ASSESSMENT: Principal Problem:   Severe preeclampsia Active Problems:   Iron deficiency anemia   Migraine headache   Moderate major depression (HCC)   Tobacco use disorder   Unwanted fertility   Chronic hypertension with superimposed preeclampsia  PLAN: 42yo 10/7126 at [redacted]w[redacted]d a/f SIPE w/ SF (BP)   SIPE w/ SF (BP) - headache improved; now with transient chest pressure, EKG pending overall low c/f ACS at this time - scheduled HA cocktail x 24h - Mag 4/2 x 24h for seizure ppx - normotensive on labetalol 600mg  TID - daily labs - BMZ#2 today - deliver at 34 weeks if BP/HA controlled    2. Anxiety - continue home clonazepam   3. Tobacco use - nicotine patch ordered  4. Iron deficiency anemia - Hgb 10.2 this  AM - PO iron   5. FWB - BMZ 3/26-27 - recent growth @ 32/0 1676g (13%), cephalic - cEFM while getting mag, then BID NST - NICU consult today  Continue routine antenatal care.  Harvie Bridge, MD Obstetrician & Gynecologist, [facility] [employer], [facility]

## 2024-03-20 NOTE — H&P (Signed)
 FACULTY PRACTICE ANTEPARTUM ADMISSION HISTORY AND PHYSICAL NOTE   History of Present Illness: Paula Dominguez is a 43 y.o. [id number] at [redacted]w[redacted]d admitted for SIPE w/ SF (BP)  Pt presented to MAU today with elevated blood pressures, headache and decreased fetal movement. She has known history of cHTN for which she takes labetalol 400mg  TID and had taken her first two doses for the day prior to arrival. She has a known history of chronic headaches for which she takes excedrin and/or fioricet prn. She notes that her headache today is worse than her usual and did not resolve with excedrin which is not normal. She received IV benadryl/reglan with improvement in her headache at the time of my evaluation.   In MAU, pt was noted to have sustained severe range blood pressures and required IR nifedipine 10mg  x 1. BP now 150s/80s. PCR too low to calculate, serum labs reassuring. BPP earlier today 8/8.   Her pregnancy is notable for the following: - cHTN: on labetalol 400mg  TID; has been largely normotensive to mild range, no prior severe range BP. Growth @ 32/0 1676g (13%), AC 16%, cephalic, post - Chronic headaches/migraine - History of SIDS (2022) - Severe anxiety/depression - on clonazepam BID prn and follows with behavioral health. F/w [facility] clinic for chronic benzodiazepine rx - Request for permanent sterilization - tubal papers signed 03/12/24, pt confirms desire for tubal  - Tobacco use disorder - History of preterm birth G9 35wk  Past Medical History:  Diagnosis Date   Anemia    Anxiety and depression    Blood transfusion without reported diagnosis    2014, 2018   GERD (gastroesophageal reflux disease)    History of preterm delivery, currently pregnant 01/30/2024   Hx of preeclampsia, prior pregnancy, currently pregnant    prior pregnancy   Hypertension    Past Surgical History:  Procedure Laterality Date   HEMORRHOID SURGERY     OB History  Gravida Para Term Preterm AB Living   11 8 7 1 2 7   SAB IAB Ectopic Multiple Live Births  2 0 0 0 8    # Outcome Date GA Lbr Len/2nd Weight Sex Type Anes PTL Lv  11 Current           10 SAB 2023          9 Preterm 07/08/21 [redacted]w[redacted]d / 00:04 2405 g F Vag-Spont EPI  DEC  8 Term 11/10/18 [redacted]w[redacted]d 08:49 / 00:03 2625 g F Vag-Spont EPI  LIV  7 Term 11/20/13 [redacted]w[redacted]d 06:10 / 00:12 2920 g M Vag-Spont EPI, Local  LIV  6 Term 12/23/10 [redacted]w[redacted]d  2722 g M Vag-Spont EPI  LIV     Birth Comments: preeclampsia  5 Term 03/29/06 [redacted]w[redacted]d  3175 g F Vag-Spont   LIV  4 SAB 2006 [redacted]w[redacted]d         3 Term 10/22/02 [redacted]w[redacted]d  3175 g M Vag-Spont   LIV  2 Term 08/10/98 [redacted]w[redacted]d  3175 g F Vag-Spont EPI  LIV  1 Term 03/23/95 [redacted]w[redacted]d  3175 g M Vag-Spont EPI  LIV    Social History   Socioeconomic History   Marital status: Married    Spouse name: Lorelee Mclaurin   Number of children: 5   Years of education: Not on file   Highest education level: Not on file  Occupational History   Occupation: CERTIFIED NURSING ASSISTANT    Employer: [employer]  Tobacco Use   Smoking status: Former    Current packs/day:  0.00    Average packs/day: 0.3 packs/day for 17.0 years (4.3 ttl pk-yrs)    Types: Cigarettes    Start date: 07/27/1996    Quit date: 07/27/2013    Years since quitting: 10.6   Smokeless tobacco: Never   Tobacco comments:    3 cigs a day  Vaping Use   Vaping status: Former   Quit date: 10/14/2023   Substances: Nicotine, Flavoring  Substance and Sexual Activity   Alcohol use: No   Drug use: No   Sexual activity: Yes    Partners: Male  Other Topics Concern   Not on file  Social History Narrative   Not on file   Social Drivers of Health   Financial Resource Strain: Not on file  Food Insecurity: No Food Insecurity (03/19/2024)   Hunger Vital Sign    Worried About Running Out of Food in the Last Year: Never true    Ran Out of Food in the Last Year: Never true  Transportation Needs: No Transportation Needs (03/19/2024)   PRAPARE - Therapist, art (Medical): No    Lack of Transportation (Non-Medical): No  Physical Activity: Not on file  Stress: Not on file  Social Connections: Not on file    Family History  Problem Relation Age of Onset   Cancer Mother    Healthy Mother    Heart disease Father    Healthy Father    Asthma Daughter    Asthma Son    Hypertension Maternal Grandmother    Diabetes Maternal Grandmother    Cancer Maternal Grandmother    Cancer Maternal Grandfather    Cancer Paternal Grandmother    Cancer Paternal Grandfather    Hypertension Other    Cancer Other     No Known Allergies  Medications Prior to Admission  Medication Sig Dispense Refill Last Dose/Taking   aspirin 81 MG chewable tablet Chew 1 tablet (81 mg total) by mouth daily. 30 tablet 4 03/19/2024   butalbital-acetaminophen-caffeine (FIORICET) 50-325-40 MG tablet Take 1 tablet by mouth 2 (two) times daily as needed for headache.   03/19/2024 Noon   cyclobenzaprine (FLEXERIL) 10 MG tablet Take 1 tablet (10 mg total) by mouth 2 (two) times daily as needed for muscle spasms. 20 tablet 2 03/19/2024 Noon   labetalol (NORMODYNE) 200 MG tablet Take 2 tablets (400 mg total) by mouth 3 (three) times daily. 180 tablet 1 03/19/2024   magnesium oxide (MAG-OX) 400 MG tablet Take 1 tablet (400 mg total) by mouth daily. 90 tablet 0 03/19/2024   omeprazole (PRILOSEC) 40 MG capsule Take 1 capsule (40 mg total) by mouth in the morning and at bedtime. 60 capsule 5 03/19/2024   Prenatal Vit-Fe Fumarate-FA (PNV PRENATAL PLUS MULTIVITAMIN) 27-1 MG TABS Take 1 capsule by mouth daily. 90 tablet 6 03/19/2024   acetaminophen-caffeine (EXCEDRIN TENSION HEADACHE) 500-65 MG TABS per tablet Take 2 tablets by mouth every 6 (six) hours as needed (headache, aches and pains, fever). 60 tablet 0    clonazePAM (KLONOPIN) 0.5 MG tablet Take 1 tablet (0.5 mg total) by mouth 2 (two) times daily as needed for anxiety. 30 tablet 3    ferrous sulfate 325 (65 FE) MG tablet  Take 1 tablet (325 mg total) by mouth every other day. 15 tablet 0    hydrOXYzine (ATARAX) 10 MG tablet Take 1 tablet (10 mg total) by mouth 3 (three) times daily as needed for anxiety. 30 tablet 1    metoCLOPramide (REGLAN) 10  MG tablet Take 1 tablet (10 mg total) by mouth every 6 (six) hours. 30 tablet 0    Misc. Devices (WRIST BRACE) MISC 2 Devices by Does not apply route as needed. (Patient not taking: Reported on 03/12/2024) 2 each 0    Misc. Devices MISC 1 Device by Does not apply route as needed. Ergonomic keyboard for carpal tunnel syndrome 1 each 0    Nicotine 21-14-7 MG/24HR KIT Place 1 kit onto the skin as directed. Use as directed 1 kit 0    nicotine polacrilex (COMMIT) 4 MG lozenge Take 1 lozenge (4 mg total) by mouth as needed for smoking cessation. (Patient not taking: Reported on 03/12/2024) 100 tablet 0    polyethylene glycol powder (GLYCOLAX/MIRALAX) 17 GM/SCOOP powder Take 17 g by mouth daily. 510 g 2    progesterone (PROMETRIUM) 200 MG capsule Place one capsule vaginally at bedtime 30 capsule 3     Review of Systems - Negative except as noted in HPI  Vitals:  BP 134/77 (BP Location: Left Arm)   Pulse 87   Temp 97.8 F (36.6 C) (Oral)   Resp 18   Ht 5\' 7"  (1.702 m)   Wt 106.6 kg   LMP 07/16/2023   SpO2 96%   BMI 36.81 kg/m  Physical Examination: CONSTITUTIONAL: Well-developed, well-nourished female in no acute distress.  NEUROLOGIC: Alert and oriented to person, place, and time. No focal neurologic deficit.  CARDIOVASCULAR: Normal heart rate noted RESPIRATORY: Effort normal, no problems with respiration noted ABDOMEN: Soft, nontender, nondistended, gravid. MUSCULOSKELETAL: 2+ pitting pedal edema  Cervix: Deferred Fetal Monitoring: 140bpm, moderate, +accels, no decels Tocometer: Flat  Labs:  Results for orders placed or performed during the hospital encounter of 03/19/24 (from the past 24 hours)  Protein / creatinine ratio, urine   Collection Time: 03/19/24   3:52 PM  Result Value Ref Range   Creatinine, Urine 60 mg/dL   Total Protein, Urine <6 mg/dL   Protein Creatinine Ratio        0.00 - 0.15 mg/mg[Cre]  CBC   Collection Time: 03/19/24  4:45 PM  Result Value Ref Range   WBC 11.4 (H) 4.0 - 10.5 K/uL   RBC 3.07 (L) 3.87 - 5.11 MIL/uL   Hemoglobin 10.2 (L) 12.0 - 15.0 g/dL   HCT 86.5 (L) 78.4 - 69.6 %   MCV 96.4 80.0 - 100.0 fL   MCH 33.2 26.0 - 34.0 pg   MCHC 34.5 30.0 - 36.0 g/dL   RDW 29.5 28.4 - 13.2 %   Platelets 228 150 - 400 K/uL   nRBC 0.0 0.0 - 0.2 %  Comprehensive metabolic panel   Collection Time: 03/19/24  4:45 PM  Result Value Ref Range   Sodium 135 135 - 145 mmol/L   Potassium 3.4 (L) 3.5 - 5.1 mmol/L   Chloride 106 98 - 111 mmol/L   CO2 19 (L) 22 - 32 mmol/L   Glucose, Bld 87 70 - 99 mg/dL   BUN <5 (L) 6 - 20 mg/dL   Creatinine, Ser 4.40 0.44 - 1.00 mg/dL   Calcium 9.0 8.9 - 10.2 mg/dL   Total Protein 6.3 (L) 6.5 - 8.1 g/dL   Albumin 2.9 (L) 3.5 - 5.0 g/dL   AST 17 15 - 41 U/L   ALT 12 0 - 44 U/L   Alkaline Phosphatase 60 38 - 126 U/L   Total Bilirubin 0.5 0.0 - 1.2 mg/dL   GFR, Estimated >72 >53 mL/min   Anion gap 10 5 -  15  Type and screen [facility]   Collection Time: 03/19/24  4:45 PM  Result Value Ref Range   ABO/RH(D) O POS    Antibody Screen NEG    Sample Expiration      03/22/2024,2359 Performed at [facility], [address], [place], [place] [address]     Imaging Studies: [place] MFM FETAL BPP WO NON STRESS Result Date: 03/19/2024 ----------------------------------------------------------------------  OBSTETRICS REPORT                    (Corrected Final 03/19/2024 04:46 pm) ---------------------------------------------------------------------- Patient Info  ID #:       [id number]                          D.O.B.:  [birth year] (42 yrs)(F)  Name:       Paula Dominguez                 Visit Date: 03/19/2024 01:18 pm              [place]  ---------------------------------------------------------------------- Performed By  Attending:        Lin Landsman      Secondary Phy.:   [facility]                    MD  Performed By:     Dennis Bast RDMS      Address:          [address]                                                             [place], [place]                                                             [address]  Referred By:      Conan Bowens          Location:         [facility]                    MD                                       [facility]  Ref. Address:     [address]                    [place], [place]                    [address] ---------------------------------------------------------------------- Orders  #  Description                           Code        Ordered By  1  [place] MFM FETAL BPP WO NON               76819.01    [facility]     STRESS ----------------------------------------------------------------------  #  Order #                     Accession #                Episode #  1  [id number]                   [id number]                 [id number] ---------------------------------------------------------------------- Indications  Advanced maternal age multigravida 66+,        O22.523  third trimester (42 yrs)  Hypertension - Chronic/Pre-existing            O10.019  (labetalol)  Obesity complicating pregnancy, third          O99.213  trimester (BMI 36)  Grand multiparity, antepartum                  O09.40  Other mental disorder complicating             O99.340  pregnancy, third trimester  Poor obstetric history: Previous preterm       O09.219  delivery, antepartum  Poor obstetric history: Previous               O09.299  preeclampsia / eclampsia/gestational HTN  [redacted] weeks gestation of pregnancy                 Z3A.32 ---------------------------------------------------------------------- Vital Signs  BP:          154/90 ---------------------------------------------------------------------- Fetal Evaluation  Num Of Fetuses:         1  Fetal Heart Rate(bpm):  155  Cardiac Activity:       Observed  Presentation:           Cephalic  Placenta:               Posterior  Amniotic Fluid  AFI FV:      Within normal limits  AFI Sum(cm)     %Tile       Largest Pocket(cm)  11.64           30          4.37  RUQ(cm)       RLQ(cm)       LUQ(cm)        LLQ(cm)  4.37  2.51          2.7            2.06 ---------------------------------------------------------------------- Biophysical Evaluation  Amniotic F.V:   Within normal limits       F. Tone:        Observed  F. Movement:    Observed                   N.S.T:          Equivocal  F. Breathing:   Observed                   Score:          8/10 ---------------------------------------------------------------------- OB History  Blood Type:   O+  Gravidity:    11        Term:   7        Prem:   1        SAB:   2  Living:       8 ---------------------------------------------------------------------- Gestational Age  LMP:           35w 2d        Date:  07/16/23                  EDD:   04/21/24  Best:          Armida Sans 5d     Det. ByMarcella Dubs         EDD:   05/09/24                                      (09/15/23) ---------------------------------------------------------------------- Anatomy  Cranium:               Previously seen        Aortic Arch:            Previously seen  Cavum:                 Previously seen        Ductal Arch:            Previously seen  Ventricles:            Appears normal         Diaphragm:              Appears normal  Choroid Plexus:        Previously seen        Stomach:                Appears normal, left                                                                        sided  Cerebellum:            Previously seen        Abdomen:                 Previously seen  Posterior Fossa:       Previously seen        Abdominal Wall:  Previously seen  Face:                  Orbits and profile     Cord Vessels:           Previously seen                         previously seen  Lips:                  Previously seen        Kidneys:                Appear normal  Thoracic:              Previously seen        Bladder:                Appears normal  Heart:                 Appears normal         Spine:                  Previously seen                         (4CH, axis, and                         situs)  RVOT:                  Previously seen        Upper Extremities:      Previously seen  LVOT:                  Previously seen        Lower Extremities:      Previously seen ---------------------------------------------------------------------- Cervix Uterus Adnexa  Cervix  Not visualized (advanced GA >24wks)  Uterus  No abnormality visualized.  Right Ovary  Not visualized.  Left Ovary  Not visualized.  Cul De Sac  No free fluid seen.  Adnexa  No abnormality visualized ---------------------------------------------------------------------- Impression  MFM Brief consultation  Ms. Caterino is a 43 yo G11P7 who is 45 w 5d who is seen  today at the request of Dr. Merian Capron.  She is monitored for chronic hypertension now on 400 mg  TID Labetalol. Today she complains that she is overall not  feeling well. She has a frontal headache as well that has  persisted.  She was seen in the MAU on 03/14/24 her PIH labs were  normal.           03/19/2024    4:16 PM       03/19/2024    3:54 PM  PM       03/19/2024    1:43 PM  Vitals with BMI  Height            5\' 7"   Weight            236 lbs 5 oz  BMI               37  Systolic 154               148  Diastolic99                97  Pulse    100  102  Imaging:  BPP 8/8 however given that she was concerned for  decreased fetal movement and uterine contractions an NST  was performed that was reassuring but not reactive.   Impression/Counseling:  Given Ms. Pultz's symptoms of headache and overall  malaise with elevated blood pressure and a non-reactive but  reassuring NST.  I have recommended that she go to the MAU for prolonged  monitoring, labs and headache treatment.  If her headache doesn't resolve consider inpatient  observation and BMZ administration.  If maternal symptoms persist and fetal status is not optimal or  equivocal consider delivery  34.  I discussed the plan of care with Paula Dominguez  I spent 45 minutes with >50% in consultation, medical record  review an care coordination. ---------------------------------------------------------------------- Recommendations  TO MAU ----------------------------------------------------------------------                   Lin Landsman, MD Electronically Signed Corrected Final Report  03/19/2024 04:46 pm ----------------------------------------------------------------------   [place] MFM FETAL BPP WO NON STRESS Result Date: 03/14/2024 ----------------------------------------------------------------------  OBSTETRICS REPORT                       (Signed Final 03/14/2024 11:02 am) ---------------------------------------------------------------------- Patient Info  ID #:       [id number]                          D.O.B.:  1981-07-31 (42 yrs)(F)  Name:       Paula Dominguez                 Visit Date: 03/14/2024 09:26 am              [facility] ---------------------------------------------------------------------- Performed By  Attending:        Braxton Feathers DO       Secondary Phy.:   [facility]  Performed By:     Isac Sarna        Address:          [address]                    [address]                                                             [address]                                                             [place], [place]                                                             [address]  Referred By:      Conan Bowens          Location:         [facility]  MD                                       [facility] at                                                             [facility]  Ref. Address:     [address]                    [place], [place]                    [address] ---------------------------------------------------------------------- Orders  #  Description                           Code        Ordered By  1  [place] MFM FETAL BPP WO NON               76819.01    [facility]     STRESS  2  [place] MFM OB FOLLOW UP                   76816.01    [facility] ----------------------------------------------------------------------  #  Order #                     Accession #                Episode #  1  [id number]                   [id number]                 [id number]  2  [id number]                   [id number]                 [id number] ---------------------------------------------------------------------- Indications  Advanced maternal age multigravida 63+,        O78.523  third trimester (42 yrs)  Hypertension - Chronic/Pre-existing            O10.019  (labetalol)  Obesity complicating pregnancy, third          O99.213  trimester (BMI 36)  Grand multiparity, antepartum                  O09.40  Other mental disorder complicating             O99.340  pregnancy, third trimester  Encounter for other antenatal screening        Z36.2  follow-up  Poor obstetric history: Previous preterm       O09.219  delivery, antepartum  Poor obstetric history: Previous  O09.299  preeclampsia / eclampsia/gestational HTN  [redacted] weeks gestation of pregnancy                Z3A.32 ---------------------------------------------------------------------- Fetal Evaluation  Num Of Fetuses:         1  Fetal Heart Rate(bpm):  153  Cardiac Activity:       Observed  Presentation:           Cephalic  Placenta:               Posterior  P. Cord Insertion:      Previously seen  Amniotic Fluid  AFI FV:      Within normal  limits  AFI Sum(cm)     %Tile       Largest Pocket(cm)  14.95           53          4.18  RUQ(cm)       RLQ(cm)       LUQ(cm)        LLQ(cm)  4.18          3.75          2.92           4.1 ---------------------------------------------------------------------- Biophysical Evaluation  Amniotic F.V:   Pocket => 2 cm             F. Tone:        Observed  F. Movement:    Observed                   Score:          8/8  F. Breathing:   Observed ---------------------------------------------------------------------- Biometry  BPD:      77.2  mm     G. Age:  31w 0d         14  %    CI:        77.01   %    70 - 86                                                          FL/HC:      21.8   %    19.1 - 21.3  HC:      278.6  mm     G. Age:  30w 3d        1.3  %    HC/AC:      1.05        0.96 - 1.17  AC:      266.4  mm     G. Age:  30w 5d         16  %    FL/BPD:     78.5   %    71 - 87  FL:       60.6  mm     G. Age:  31w 4d         24  %    FL/AC:      22.7   %    20 - 24  LV:        1.5  mm  Est. FW:    1676  gm    3 lb 11 oz      13  % ---------------------------------------------------------------------- OB History  Blood Type:   O+  Gravidity:    11        Term:   7        Prem:   1        SAB:   2  Living:       8 ---------------------------------------------------------------------- Gestational Age  LMP:           34w 4d        Date:  07/16/23                  EDD:   04/21/24  U/S Today:     31w 0d                                        EDD:   05/16/24  Best:          32w 0d     Det. ByMarcella Dubs         EDD:   05/09/24                                      (09/15/23) ---------------------------------------------------------------------- Targeted Anatomy  Central Nervous System  Calvarium/Cranial V.:  Appears normal         Cereb./Vermis:          Previously seen  Cavum:                 Previously seen        [employer]:         Previously seen  Lateral Ventricles:    Previously seen        Midline Falx:            Previously seen  Choroid Plexus:        Previously seen  Spine  Cervical:              Previously seen        Sacral:                 Previously seen  Thoracic:              Previously seen        Shape/Curvature:        Previously seen  Lumbar:                Previously seen  Head/Neck  Lips:                  Previously seen        Profile:                Previously seen  Neck:                  Previously seen        Orbits/Eyes:            Previously seen  Nuchal Fold:           Previously seen        Mandible:               Previously seen  Nasal Bone:            Previously seen        Maxilla:  Previously seen  Thorax  4 [employer]:        Previously seen        [employer]. Septum:     Previously seen  Cardiac Activity:      Observed               Cardiac Axis:           Previously seen  Cardiac Situs:         Previously seen        Diaphragm:              Appears normal  Rt Outflow Tract:      Previously seen        3 Vessel View:          Previously seen  Lt Outflow Tract:      Previously seen        3 V Trachea View:       Previously seen  Aortic Arch:           Previously seen        IVC:                    Previously seen  Ductal Arch:           Appears normal         Crossing:               Not well visualized  SVC:                   Previously seen  Abdomen  Ventral Wall:          Previously seen        Lt Kidney:              Appears normal  Cord Insertion:        Previously seen        Rt Kidney:              Appears normal  Situs:                 Previously seen        Bladder:                Appears normal  Stomach:               Appears normal  Extremities  Lt Humerus:            Previously seen        Lt Femur:               Previously seen  Rt Humerus:            Previously seen        Rt Femur:               Previously seen  Lt Forearm:            Previously seen        Lt Lower Leg:           Previously seen  Rt Forearm:            Previously seen        Rt Lower Leg:            Previously seen  Lt Hand:               Previously seen  Lt Foot:                Previously seen  Rt Hand:               Previously seen        Rt Foot:                Previously seen  Other  Umbilical Cord:        Previously seen        Genitalia:              Previously seen  Comment:     Technically difficult due to maternal habitus and fetal position. ---------------------------------------------------------------------- Comments  MFM Consult Note  Paula Dominguez has a pregnancy with the  complications mentioned in the problem list. During today's  visit we focused on the following concerns:  RE hx of preeclampsia, CHTN, headache and hx of newborn  demise (SIDS): This patient was seen in the office today for a  biophysical profile which is normal.  She also had a growth  ultrasound that showed an estimated fetal weight around the  13th percentile.  The estimated fetal weight has continued to  drop since her anatomy scan where was around 55th  percentile.  She has a history of preeclampsia as well as a  newborn demise from SIDS in her most recent pregnancy.  The patient reports a 2-week history of a headache that is  gone increasing in intensity.  She occasionally has visual  disturbances associated with the headache.  She was  referred to neurology but does not have an appointment until  1 April per her report.  She is taking Tylenol, Flexeril, Fioricet  all with little relief.  She does have a history of migraines.  I  discussed my concern for preeclampsia given her numerous  risk factors for superimposed preeclampsia on chronic  hypertension.  I discussed that in order to rule out  preeclampsia she needs labs done today.  I also called the  MAU provider to discuss a more thorough approach to her  headache  I encouraged them to expedite a conversation with  neurology to see if there are any other medications that may  be effective in the event that she does not have preeclampsia.  Sonographic  findings  Single intrauterine pregnancy.  Fetal cardiac activity: Observed.  Presentation: Cephalic.  Interval fetal anatomy appears normal.  Fetal biometry shows the estimated fetal weight at the 13  percentile and the abdominal circumference at the 16  percentile.  Amniotic fluid: Within normal limits.  MVP: 4.18 cm.  Placenta: Posterior.  BPP: 8/8.  There are limitations of prenatal ultrasound such as the  inability to detect certain abnormalities due to poor  visualization. Various factors such as fetal position,  gestational age and maternal body habitus may increase the  difficulty in visualizing the fetal anatomy.  Recommendations  -Sent to MAU for rule out preeclampsia and evaluation of her  headache  -Continue weekly antenatal testing.  If she develops  preeclampsia without severe features then antenatal testing  should be increased to twice weekly with weekly labs  (CBC/CMP)  -Due to the declining fetal growth and the growth ultrasound  should be done in 3 weeks.  -Since the blood pressure is above goal today we will  increase her labetalol to 400 three times a day  -Delivery timing will be pending clinical course but likely 7  weeks  -Continue routine prenatal care  with referring OB provider ----------------------------------------------------------------------                  Braxton Feathers, DO Electronically Signed Final Report   03/14/2024 11:02 am ----------------------------------------------------------------------   [place] MFM OB FOLLOW UP Result Date: 03/14/2024 ----------------------------------------------------------------------  OBSTETRICS REPORT                       (Signed Final 03/14/2024 11:02 am) ---------------------------------------------------------------------- Patient Info  ID #:       [id number]                          D.O.B.:  [birth year] (42 yrs)(F)  Name:       Paula Dominguez                 Visit Date: 03/14/2024 09:26 am              Shieh  ---------------------------------------------------------------------- Performed By  Attending:        Braxton Feathers DO       Secondary Phy.:   [facility]  Performed By:     Isac Sarna        Address:          [address]                    [address]                                                             [address]                                                             [place], [place]                                                             [address]  Referred By:      Conan Bowens          Location[facility]         [facility]                                                             [facility]  Ref. Address:     [address]                    [place], [place]                    [address] ---------------------------------------------------------------------- Orders  #  Description                           Code        Ordered By  1  [place] MFM FETAL BPP WO NON               76819.01    BURK SCHAIBLE     STRESS  2  [place] MFM OB FOLLOW UP                   76816.01    [facility] ----------------------------------------------------------------------  #  Order #                     Accession #                Episode #  1  [id number]                   [id number]                 [id number]  2  [id number]                   [id number]                 [id number] ---------------------------------------------------------------------- Indications  Advanced maternal age multigravida 50+,        O13.523  third trimester (42 yrs)  Hypertension - Chronic/Pre-existing            O10.019  (labetalol)  Obesity complicating pregnancy, third          O99.213  trimester (BMI 36)  Grand multiparity, antepartum                  O09.40  Other mental disorder complicating             O99.340  pregnancy, third trimester  Encounter for other antenatal screening        Z36.2  follow-up  Poor  obstetric history: Previous preterm       O09.219  delivery, antepartum  Poor obstetric history: Previous               O09.299  preeclampsia / eclampsia/gestational HTN  [redacted] weeks gestation of pregnancy                Z3A.32 ---------------------------------------------------------------------- Fetal Evaluation  Num Of Fetuses:         1  Fetal Heart Rate(bpm):  153  Cardiac Activity:       Observed  Presentation:           Cephalic  Placenta:               Posterior  P. Cord Insertion:      Previously seen  Amniotic Fluid  AFI FV:      Within normal limits  AFI Sum(cm)     %Tile       Largest Pocket(cm)  14.95           53          4.18  RUQ(cm)  RLQ(cm)       LUQ(cm)        LLQ(cm)  4.18          3.75          2.92           4.1 ---------------------------------------------------------------------- Biophysical Evaluation  Amniotic F.V:   Pocket => 2 cm             F. Tone:        Observed  F. Movement:    Observed                   Score:          8/8  F. Breathing:   Observed ---------------------------------------------------------------------- Biometry  BPD:      77.2  mm     G. Age:  31w 0d         14  %    CI:        77.01   %    70 - 86                                                          FL/HC:      21.8   %    19.1 - 21.3  HC:      278.6  mm     G. Age:  30w 3d        1.3  %    HC/AC:      1.05        0.96 - 1.17  AC:      266.4  mm     G. Age:  30w 5d         16  %    FL/BPD:     78.5   %    71 - 87  FL:       60.6  mm     G. Age:  31w 4d         24  %    FL/AC:      22.7   %    20 - 24  LV:        1.5  mm  Est. FW:    1676  gm    3 lb 11 oz      13  % ---------------------------------------------------------------------- OB History  Blood Type:   O+  Gravidity:    11        Term:   7        Prem:   1        SAB:   2  Living:       8 ---------------------------------------------------------------------- Gestational Age  LMP:           34w 4d        Date:  07/16/23                  EDD:   04/21/24   U/S Today:     31w 0d                                        EDD:   05/16/24  Best:  32w 0d     Det. ByMarcella Dubs         EDD:   05/09/24                                      (09/15/23) ---------------------------------------------------------------------- Targeted Anatomy  Central Nervous System  Calvarium/Cranial V.:  Appears normal         Cereb./Vermis:          Previously seen  Cavum:                 Previously seen        [employer]:         Previously seen  Lateral Ventricles:    Previously seen        Midline Falx:           Previously seen  Choroid Plexus:        Previously seen  Spine  Cervical:              Previously seen        Sacral:                 Previously seen  Thoracic:              Previously seen        Shape/Curvature:        Previously seen  Lumbar:                Previously seen  Head/Neck  Lips:                  Previously seen        Profile:                Previously seen  Neck:                  Previously seen        Orbits/Eyes:            Previously seen  Nuchal Fold:           Previously seen        Mandible:               Previously seen  Nasal Bone:            Previously seen        Maxilla:                Previously seen  Thorax  4 Chamber View:        Previously seen        Interventr. Septum:     Previously seen  Cardiac Activity:      Observed               Cardiac Axis:           Previously seen  Cardiac Situs:         Previously seen        Diaphragm:              Appears normal  Rt Outflow Tract:      Previously seen        3 Vessel View:          Previously seen  Lt Outflow Tract:      Previously seen        3 V Trachea View:  Previously seen  Aortic Arch:           Previously seen        IVC:                    Previously seen  Ductal Arch:           Appears normal         Crossing:               Not well visualized  SVC:                   Previously seen  Abdomen  Ventral Wall:          Previously seen        Lt Kidney:              Appears normal   Cord Insertion:        Previously seen        Rt Kidney:              Appears normal  Situs:                 Previously seen        Bladder:                Appears normal  Stomach:               Appears normal  Extremities  Lt Humerus:            Previously seen        Lt Femur:               Previously seen  Rt Humerus:            Previously seen        Rt Femur:               Previously seen  Lt Forearm:            Previously seen        Lt Lower Leg:           Previously seen  Rt Forearm:            Previously seen        Rt Lower Leg:           Previously seen  Lt Hand:               Previously seen        Lt Foot:                Previously seen  Rt Hand:               Previously seen        Rt Foot:                Previously seen  Other  Umbilical Cord:        Previously seen        Genitalia:              Previously seen  Comment:     Technically difficult due to maternal habitus and fetal position. ---------------------------------------------------------------------- Comments  MFM Consult Note  Tiffinie MICHELLE Huffstetler has a pregnancy with the  complications mentioned in the problem list. During today's  visit we focused on the following concerns:  RE hx of preeclampsia, CHTN, headache and hx of newborn  demise (SIDS): This patient was  seen in the office today for a  biophysical profile which is normal.  She also had a growth  ultrasound that showed an estimated fetal weight around the  13th percentile.  The estimated fetal weight has continued to  drop since her anatomy scan where was around 55th  percentile.  She has a history of preeclampsia as well as a  newborn demise from SIDS in her most recent pregnancy.  The patient reports a 2-week history of a headache that is  gone increasing in intensity.  She occasionally has visual  disturbances associated with the headache.  She was  referred to neurology but does not have an appointment until  1 April per her report.  She is taking Tylenol, Flexeril,  Fioricet  all with little relief.  She does have a history of migraines.  I  discussed my concern for preeclampsia given her numerous  risk factors for superimposed preeclampsia on chronic  hypertension.  I discussed that in order to rule out  preeclampsia she needs labs done today.  I also called the  MAU provider to discuss a more thorough approach to her  headache  I encouraged them to expedite a conversation with  neurology to see if there are any other medications that may  be effective in the event that she does not have preeclampsia.  Sonographic findings  Single intrauterine pregnancy.  Fetal cardiac activity: Observed.  Presentation: Cephalic.  Interval fetal anatomy appears normal.  Fetal biometry shows the estimated fetal weight at the 13  percentile and the abdominal circumference at the 16  percentile.  Amniotic fluid: Within normal limits.  MVP: 4.18 cm.  Placenta: Posterior.  BPP: 8/8.  There are limitations of prenatal ultrasound such as the  inability to detect certain abnormalities due to poor  visualization. Various factors such as fetal position,  gestational age and maternal body habitus may increase the  difficulty in visualizing the fetal anatomy.  Recommendations  -Sent to MAU for rule out preeclampsia and evaluation of her  headache  -Continue weekly antenatal testing.  If she develops  preeclampsia without severe features then antenatal testing  should be increased to twice weekly with weekly labs  (CBC/CMP)  -Due to the declining fetal growth and the growth ultrasound  should be done in 3 weeks.  -Since the blood pressure is above goal today we will  increase her labetalol to 400 three times a day  -Delivery timing will be pending clinical course but likely 7  weeks  -Continue routine prenatal care with referring OB provider ----------------------------------------------------------------------                  Braxton Feathers, DO Electronically Signed Final Report   03/14/2024 11:02 am  ----------------------------------------------------------------------   Assessment and Plan: 42yo 10/7126 at [redacted]w[redacted]d a/f SIPE w/ SF (BP)  SIPE w/ SF (BP) - headache improving s/p reglan/benadryl. Continue excedrin prn, HA cocktails prn - Mag 4/2 x 24h for seizure ppx - increase to labetalol 600mg  TID - daily labs - BMZ ordered - deliver at 34 weeks if BP/HA controlled   2. Anxiety - continue home clonazepam  3. Tobacco use - nicotine patch ordered  4. FWB - BMZ 3/26-27 - recent growth @ 32/0 1676g (13%), cephalic - cEFM while getting mag, then BID NST - NICU consult ordered & called  Harvie Bridge, MD Obstetrician & Gynecologist, [facility], [facility]

## 2024-03-21 ENCOUNTER — Inpatient Hospital Stay (HOSPITAL_COMMUNITY)

## 2024-03-21 DIAGNOSIS — O1493 Unspecified pre-eclampsia, third trimester: Secondary | ICD-10-CM | POA: Diagnosis not present

## 2024-03-21 DIAGNOSIS — O09523 Supervision of elderly multigravida, third trimester: Secondary | ICD-10-CM | POA: Diagnosis not present

## 2024-03-21 DIAGNOSIS — Z3A33 33 weeks gestation of pregnancy: Secondary | ICD-10-CM

## 2024-03-21 LAB — CBC
HCT: 29.2 % — ABNORMAL LOW (ref 36.0–46.0)
Hemoglobin: 9.7 g/dL — ABNORMAL LOW (ref 12.0–15.0)
MCH: 32.7 pg (ref 26.0–34.0)
MCHC: 33.2 g/dL (ref 30.0–36.0)
MCV: 98.3 fL (ref 80.0–100.0)
Platelets: 227 10*3/uL (ref 150–400)
RBC: 2.97 MIL/uL — ABNORMAL LOW (ref 3.87–5.11)
RDW: 14.1 % (ref 11.5–15.5)
WBC: 15.6 10*3/uL — ABNORMAL HIGH (ref 4.0–10.5)
nRBC: 0.1 % (ref 0.0–0.2)

## 2024-03-21 LAB — COMPREHENSIVE METABOLIC PANEL WITH GFR
ALT: 12 U/L (ref 0–44)
AST: 23 U/L (ref 15–41)
Albumin: 2.8 g/dL — ABNORMAL LOW (ref 3.5–5.0)
Alkaline Phosphatase: 58 U/L (ref 38–126)
Anion gap: 6 (ref 5–15)
BUN: 6 mg/dL (ref 6–20)
CO2: 20 mmol/L — ABNORMAL LOW (ref 22–32)
Calcium: 8 mg/dL — ABNORMAL LOW (ref 8.9–10.3)
Chloride: 111 mmol/L (ref 98–111)
Creatinine, Ser: 0.68 mg/dL (ref 0.44–1.00)
GFR, Estimated: >60 mL/min (ref >60)
Glucose, Bld: 128 mg/dL — ABNORMAL HIGH (ref 70–99)
Potassium: 3.5 mmol/L (ref 3.5–5.1)
Sodium: 137 mmol/L (ref 135–145)
Total Bilirubin: 0.3 mg/dL (ref 0.0–1.2)
Total Protein: 6.1 g/dL — ABNORMAL LOW (ref 6.5–8.1)

## 2024-03-21 MED ORDER — SOD CITRATE-CITRIC ACID 500-334 MG/5ML PO SOLN: 30.0000 mL | ORAL | Status: DC | PRN

## 2024-03-21 MED ORDER — LIDOCAINE HCL (PF) 1 % IJ SOLN: 30.0000 mL | INTRAMUSCULAR | Status: DC | PRN

## 2024-03-21 MED ORDER — HYDROXYZINE HCL 50 MG PO TABS
25.0000 mg | ORAL_TABLET | Freq: Three times a day (TID) | ORAL | Status: DC | PRN
  Administered 2024-03-21: 25 mg via ORAL
  Filled 2024-03-21: qty 1

## 2024-03-21 MED ORDER — TRANEXAMIC ACID-NACL 1000-0.7 MG/100ML-% IV SOLN
1000.0000 mg | Freq: Once | INTRAVENOUS | Status: AC | PRN
  Administered 2024-03-22: 1000 mg via INTRAVENOUS
  Filled 2024-03-21: qty 100

## 2024-03-21 MED ORDER — LABETALOL HCL 5 MG/ML IV SOLN: 40.0000 mg | INTRAVENOUS | Status: DC | PRN

## 2024-03-21 MED ORDER — MAGNESIUM SULFATE BOLUS VIA INFUSION
4.0000 g | Freq: Once | INTRAVENOUS | Status: AC
  Administered 2024-03-21: 4 g via INTRAVENOUS
  Filled 2024-03-21: qty 1000

## 2024-03-21 MED ORDER — LABETALOL HCL 5 MG/ML IV SOLN: 20.0000 mg | INTRAVENOUS | Status: DC | PRN

## 2024-03-21 MED ORDER — LACTATED RINGERS IV SOLN
500.0000 mL | INTRAVENOUS | Status: DC | PRN
  Administered 2024-03-22: 500 mL via INTRAVENOUS

## 2024-03-21 MED ORDER — MAGNESIUM SULFATE 40 GM/1000ML IV SOLN
2.0000 g/h | INTRAVENOUS | Status: AC
  Administered 2024-03-21 – 2024-03-22 (×2): 2 g/h via INTRAVENOUS
  Filled 2024-03-21 (×2): qty 1000

## 2024-03-21 MED ORDER — TERBUTALINE SULFATE 1 MG/ML IJ SOLN: 0.2500 mg | Freq: Once | INTRAMUSCULAR | Status: DC | PRN

## 2024-03-21 MED ORDER — ACETAMINOPHEN 325 MG PO TABS
650.0000 mg | ORAL_TABLET | ORAL | Status: DC | PRN
  Filled 2024-03-21 (×2): qty 2

## 2024-03-21 MED ORDER — MENTHOL 3 MG MT LOZG
1.0000 | LOZENGE | OROMUCOSAL | Status: DC | PRN
  Administered 2024-03-21: 3 mg via ORAL
  Filled 2024-03-21: qty 9

## 2024-03-21 MED ORDER — LACTATED RINGERS IV SOLN: INTRAVENOUS | Status: DC

## 2024-03-21 MED ORDER — HYDRALAZINE HCL 20 MG/ML IJ SOLN: 10.0000 mg | INTRAMUSCULAR | Status: DC | PRN

## 2024-03-21 MED ORDER — OXYTOCIN-SODIUM CHLORIDE 30-0.9 UT/500ML-% IV SOLN
1.0000 m[IU]/min | INTRAVENOUS | Status: DC
  Administered 2024-03-22: 2 m[IU]/min via INTRAVENOUS
  Filled 2024-03-21: qty 500

## 2024-03-21 MED ORDER — FENTANYL CITRATE (PF) 100 MCG/2ML IJ SOLN: 50.0000 ug | INTRAMUSCULAR | Status: DC | PRN

## 2024-03-21 MED ORDER — OXYCODONE HCL 5 MG PO TABS
5.0000 mg | ORAL_TABLET | Freq: Once | ORAL | Status: AC
  Administered 2024-03-21: 5 mg via ORAL
  Filled 2024-03-21: qty 1

## 2024-03-21 MED ORDER — MISOPROSTOL 25 MCG QUARTER TABLET
25.0000 ug | ORAL_TABLET | Freq: Once | ORAL | Status: AC
  Administered 2024-03-21: 25 ug via VAGINAL
  Filled 2024-03-21: qty 1

## 2024-03-21 MED ORDER — LORAZEPAM 0.5 MG PO TABS
0.5000 mg | ORAL_TABLET | Freq: Four times a day (QID) | ORAL | Status: DC | PRN
Start: 2024-03-21 — End: 2024-03-22
  Administered 2024-03-21 – 2024-03-22 (×2): 0.5 mg via ORAL
  Filled 2024-03-21 (×2): qty 1

## 2024-03-21 MED ORDER — PENICILLIN G POT IN DEXTROSE 60000 UNIT/ML IV SOLN
3.0000 10*6.[IU] | INTRAVENOUS | Status: DC
  Administered 2024-03-22 (×2): 3 10*6.[IU] via INTRAVENOUS
  Filled 2024-03-21 (×4): qty 50

## 2024-03-21 MED ORDER — METOCLOPRAMIDE HCL 10 MG PO TABS
10.0000 mg | ORAL_TABLET | Freq: Four times a day (QID) | ORAL | Status: DC | PRN
  Filled 2024-03-21: qty 1

## 2024-03-21 MED ORDER — CAFFEINE 200 MG PO TABS
200.0000 mg | ORAL_TABLET | Freq: Once | ORAL | Status: AC
  Administered 2024-03-21: 200 mg via ORAL
  Filled 2024-03-21: qty 1

## 2024-03-21 MED ORDER — ONDANSETRON HCL 4 MG/2ML IJ SOLN: 4.0000 mg | Freq: Four times a day (QID) | INTRAMUSCULAR | Status: DC | PRN

## 2024-03-21 MED ORDER — LABETALOL HCL 5 MG/ML IV SOLN: 80.0000 mg | INTRAVENOUS | Status: DC | PRN

## 2024-03-21 MED ORDER — OXYTOCIN BOLUS FROM INFUSION
333.0000 mL | Freq: Once | INTRAVENOUS | Status: AC
  Administered 2024-03-22: 333 mL via INTRAVENOUS

## 2024-03-21 MED ORDER — OXYCODONE-ACETAMINOPHEN 5-325 MG PO TABS: 2.0000 | ORAL_TABLET | ORAL | Status: DC | PRN

## 2024-03-21 MED ORDER — PANTOPRAZOLE SODIUM 40 MG PO TBEC
40.0000 mg | DELAYED_RELEASE_TABLET | Freq: Two times a day (BID) | ORAL | Status: DC
  Administered 2024-03-21 – 2024-03-22 (×2): 40 mg via ORAL
  Filled 2024-03-21 (×2): qty 1

## 2024-03-21 MED ORDER — SODIUM CHLORIDE 0.9 % IV SOLN
5.0000 10*6.[IU] | Freq: Once | INTRAVENOUS | Status: AC
  Administered 2024-03-21: 5 10*6.[IU] via INTRAVENOUS
  Filled 2024-03-21: qty 5

## 2024-03-21 MED ORDER — OXYCODONE-ACETAMINOPHEN 5-325 MG PO TABS
1.0000 | ORAL_TABLET | ORAL | Status: DC | PRN
  Administered 2024-03-22: 1 via ORAL
  Filled 2024-03-21: qty 1

## 2024-03-21 MED ORDER — MISOPROSTOL 50MCG HALF TABLET
50.0000 ug | ORAL_TABLET | Freq: Once | ORAL | Status: DC
  Administered 2024-03-21: 50 ug via ORAL
  Filled 2024-03-21: qty 1

## 2024-03-21 MED ORDER — OXYTOCIN-SODIUM CHLORIDE 30-0.9 UT/500ML-% IV SOLN: 2.5000 [IU]/h | INTRAVENOUS | Status: DC

## 2024-03-21 NOTE — Progress Notes (Addendum)
 FACULTY PRACTICE ANTEPARTUM(COMPREHENSIVE) NOTE  Paula Dominguez is a 43 y.o. [id number] with Estimated Date of Delivery: 05/09/24   By  early ultrasound [redacted]w[redacted]d  who is admitted for superimposed preE with severe features by BP.    Fetal presentation is cephalic. Length of Stay:  2  Days  Date of admission:03/19/2024  Subjective: Pt states that she is about the same.  Headache has persisted, but no change from yesterday.  No change in vision.  No RUQ pain.  Reports no acute changes overnight. Patient reports the fetal movement as decreased  but present Patient reports uterine contraction  activity as none. Patient reports  vaginal bleeding as none. Patient describes fluid per vagina as None.  Vitals:  Blood pressure (!) 152/83, pulse 90, temperature 98.5 F (36.9 C), temperature source Oral, resp. rate 20, height 5\' 7"  (1.702 m), weight 106.6 kg, last menstrual period 07/16/2023, SpO2 96%, unknown if currently breastfeeding. Vitals:   03/20/24 1928 03/21/24 0013 03/21/24 0412 03/21/24 0756  BP: (!) 147/81 120/66 (!) 143/70 (!) 152/83  Pulse: 90 88 93 90  Resp: 18 16 17 20   Temp: 99.1 F (37.3 C) 97.9 F (36.6 C) 98.3 F (36.8 C) 98.5 F (36.9 C)  TempSrc: Axillary Oral Oral Oral  SpO2: 98% 97% 96% 96%  Weight:      Height:       Physical Examination:  General appearance - alert, well appearing, and in no distress Mental status - cooperative, appears anxious Chest - clear to auscultation, no wheezes, rales or rhonchi, symmetric air entry Heart - normal rate and regular rhythm Abdomen - gravid, soft and non-tender Musculoskeletal - no muscular tenderness noted Extremities - 1+ edema Skin - warm and dry  Fetal Monitoring:  Baseline: 140 bpm, Variability: moderate, Accelerations: +10x10 accels, 15x15 x1, and Decelerations: Absent    Appropriate for gestational age  Labs:  Results for orders placed or performed during the hospital encounter of 03/19/24 (from the past 24  hours)  Comprehensive metabolic panel   Collection Time: 03/21/24  4:23 AM  Result Value Ref Range   Sodium 137 135 - 145 mmol/L   Potassium 3.5 3.5 - 5.1 mmol/L   Chloride 111 98 - 111 mmol/L   CO2 20 (L) 22 - 32 mmol/L   Glucose, Bld 128 (H) 70 - 99 mg/dL   BUN 6 6 - 20 mg/dL   Creatinine, Ser 4.78 0.44 - 1.00 mg/dL   Calcium 8.0 (L) 8.9 - 10.3 mg/dL   Total Protein 6.1 (L) 6.5 - 8.1 g/dL   Albumin 2.8 (L) 3.5 - 5.0 g/dL   AST 23 15 - 41 U/L   ALT 12 0 - 44 U/L   Alkaline Phosphatase 58 38 - 126 U/L   Total Bilirubin 0.3 0.0 - 1.2 mg/dL   GFR, Estimated >29 >56 mL/min   Anion gap 6 5 - 15  CBC   Collection Time: 03/21/24  4:23 AM  Result Value Ref Range   WBC 15.6 (H) 4.0 - 10.5 K/uL   RBC 2.97 (L) 3.87 - 5.11 MIL/uL   Hemoglobin 9.7 (L) 12.0 - 15.0 g/dL   HCT 21.3 (L) 08.6 - 57.8 %   MCV 98.3 80.0 - 100.0 fL   MCH 32.7 26.0 - 34.0 pg   MCHC 33.2 30.0 - 36.0 g/dL   RDW 46.9 62.9 - 52.8 %   Platelets 227 150 - 400 K/uL   nRBC 0.1 0.0 - 0.2 %    Imaging Studies:  [place] MFM FETAL BPP WO NON STRESS Result Date: 03/19/2024 ----------------------------------------------------------------------  OBSTETRICS REPORT                    (Corrected Final 03/19/2024 04:46 pm) ---------------------------------------------------------------------- Patient Info  ID #:       [id number]                          D.O.B.:  [birth year] (42 yrs)(F)  Name:       Paula Dominguez                 Visit Date: 03/19/2024 01:18 pm              Ast ---------------------------------------------------------------------- Performed By  Attending:        Lin Landsman      Secondary Phy.:   [facility]                    MD  Performed By:     Dennis Bast RDMS      Address:          [address]                                                             [place], [place]                                                             [address]   Referred By:      Conan Bowens          Location[facility]         [facility]                    MD                                       [facility] at                                                             [facility]  Ref. Address:     [address]                    [place], [place]                    [address] ---------------------------------------------------------------------- Orders  #  Description                           Code        Ordered By  1  [place] MFM FETAL BPP WO NON               76819.01    RAVI [facility]     STRESS ----------------------------------------------------------------------  #  Order #                     Accession #                Episode #  1  [id number]                   [id number]                 [id number] ---------------------------------------------------------------------- Indications  Advanced maternal age multigravida 5+,        O36.523  third trimester (42 yrs)  Hypertension - Chronic/Pre-existing            O10.019  (labetalol)  Obesity complicating pregnancy, third          O99.213  trimester (BMI 36)  Grand multiparity, antepartum                  O09.40  Other mental disorder complicating             O99.340  pregnancy, third trimester  Poor obstetric history: Previous preterm       O09.219  delivery, antepartum  Poor obstetric history: Previous               O09.299  preeclampsia / eclampsia/gestational HTN  [redacted] weeks gestation of pregnancy                Z3A.32 ---------------------------------------------------------------------- Vital Signs  BP:          154/90 ---------------------------------------------------------------------- Fetal Evaluation  Num Of Fetuses:         1  Fetal Heart Rate(bpm):  155  Cardiac Activity:       Observed  Presentation:           Cephalic  Placenta:               Posterior  Amniotic Fluid  AFI FV:      Within normal limits  AFI Sum(cm)     %Tile       Largest Pocket(cm)   11.64           30          4.37  RUQ(cm)       RLQ(cm)       LUQ(cm)        LLQ(cm)  4.37          2.51          2.7            2.06 ---------------------------------------------------------------------- Biophysical Evaluation  Amniotic F.V:   Within normal limits       F. Tone:        Observed  F. Movement:  Observed                   N.S.T:          Equivocal  F. Breathing:   Observed                   Score:          8/10 ---------------------------------------------------------------------- OB History  Blood Type:   O+  Gravidity:    11        Term:   7        Prem:   1        SAB:   2  Living:       8 ---------------------------------------------------------------------- Gestational Age  LMP:           35w 2d        Date:  07/16/23                  EDD:   04/21/24  Best:          Armida Sans 5d     Det. ByMarcella Dubs         EDD:   05/09/24                                      (09/15/23) ---------------------------------------------------------------------- Anatomy  Cranium:               Previously seen        Aortic Arch:            Previously seen  Cavum:                 Previously seen        Ductal Arch:            Previously seen  Ventricles:            Appears normal         Diaphragm:              Appears normal  Choroid Plexus:        Previously seen        Stomach:                Appears normal, left                                                                        sided  Cerebellum:            Previously seen        Abdomen:                Previously seen  Posterior Fossa:       Previously seen        Abdominal Wall:         Previously seen  Face:                  Orbits and profile     Cord Vessels:           Previously seen  previously seen  Lips:                  Previously seen        Kidneys:                Appear normal  Thoracic:              Previously seen        Bladder:                Appears normal  Heart:                 Appears normal         Spine:                   Previously seen                         (4CH, axis, and                         situs)  RVOT:                  Previously seen        Upper Extremities:      Previously seen  LVOT:                  Previously seen        Lower Extremities:      Previously seen ---------------------------------------------------------------------- Cervix Uterus Adnexa  Cervix  Not visualized (advanced GA >24wks)  Uterus  No abnormality visualized.  Right Ovary  Not visualized.  Left Ovary  Not visualized.  Cul De Sac  No free fluid seen.  Adnexa  No abnormality visualized ---------------------------------------------------------------------- Impression  MFM Brief consultation  Ms. Creasman is a 43 yo G11P7 who is 60 w 5d who is seen  today at the request of Dr. Merian Capron.  She is monitored for chronic hypertension now on 400 mg  TID Labetalol. Today she complains that she is overall not  feeling well. She has a frontal headache as well that has  persisted.  She was seen in the MAU on 03/14/24 her PIH labs were  normal.           03/19/2024    4:16 PM       03/19/2024    3:54 PM  PM       03/19/2024    1:43 PM  Vitals with BMI  Height            5\' 7"   Weight            236 lbs 5 oz  BMI               37  Systolic 154               148  Diastolic99                97  Pulse    100               102  Imaging:  BPP 8/8 however given that she was concerned for  decreased fetal movement and uterine contractions an NST  was performed that was reassuring but not reactive.  Impression/Counseling:  Given Ms. Twersky's symptoms of headache and overall  malaise with elevated blood pressure and a non-reactive but  reassuring NST.  I  have recommended that she go to the MAU for prolonged  monitoring, labs and headache treatment.  If her headache doesn't resolve consider inpatient  observation and BMZ administration.  If maternal symptoms persist and fetal status is not optimal or  equivocal consider delivery  34.  I discussed the plan of  care with Marcell Barlow NP  I spent 45 minutes with >50% in consultation, medical record  review an care coordination. ---------------------------------------------------------------------- Recommendations  TO MAU ----------------------------------------------------------------------                   Lin Landsman, MD Electronically Signed Corrected Final Report  03/19/2024 04:46 pm ----------------------------------------------------------------------     Medications:  Scheduled  docusate sodium  100 mg Oral Daily   ferrous sulfate  325 mg Oral QODAY   labetalol  600 mg Oral Q8H   nicotine  21 mg Transdermal Daily   pantoprazole  40 mg Oral Daily   prenatal multivitamin  1 tablet Oral Q1200   I have reviewed the patient's current medications.  ASSESSMENT: [id number] [redacted]w[redacted]d Estimated Date of Delivery: 05/09/24   ASSESSMENT: Principal Problem:   Chronic HTN with superimposed Severe preeclampsia Active Problems:   Iron deficiency anemia   Migraine headache   Moderate major depression (HCC)   Tobacco use disorder   Unwanted fertility   Chronic hypertension with superimposed preeclampsia     PLAN:   1) Chronic hypertension with superimposed preeclampsia -s/p Mag x 24hr  -BP has been stable on Labetalol 600mg  TID, will continue to monitor -labs stable, continue daily -symptomatically no change from baseline  2) FWB -reassuring for gestational age -continue NST q shift -BPP today -s/p BMZ -last growth 3/20 -NICU consult in  3) Maternal care -Anxiety- continue home Clonazepam -tobacco use- patch ordered -routine prenatal care  Myna Hidalgo, DO Attending Obstetrician & Gynecologist, [facility] [employer], [facility]

## 2024-03-21 NOTE — Progress Notes (Signed)
 Labor Progress Note Paula Dominguez is a 43 y.o. [id number] at [redacted]w[redacted]d presented for IOL for SIPE on CHTN with 6/10 BPP/NST today S: Bilateral headaches, requesting medication lengthy discussion of options. Very anxious about labor and symptomatic preeclampsia. Pt states hydroxizine causes anger, and she would like to avoid it.   O:  BP (!) 149/84   Pulse 94   Temp 98.5 F (36.9 C) (Oral)   Resp (!) 28   Ht 5\' 7"  (1.702 m)   Wt 106.6 kg   LMP 07/16/2023   SpO2 98%   BMI 36.81 kg/m   EFM: baseline 145, accels, no decels, moderate variability TOCO: 1 contraction  Dilation: Closed Effacement (%): 50 Station: Ballotable Exam by:: [employer], RN   A&P: 43 y.o. [id number] [redacted]w[redacted]d admitted for IOL #Labor: Starting with vaginal cytotec given closed cervix and 6/10 BPP/NST today #Pain: Oxycodone, caffeine, ativan for h/a and anxiety #FWB: Cat I #GBS positive; PCN  #CHTN with SIPE w/SF - Mg infusion  Wyn Forster, MD 8:20 PM

## 2024-03-21 NOTE — Progress Notes (Signed)
 Met with Paula Dominguez at her bedside for ongoing [facility] support and offer encouragement through labor process/review expectations following delivery, including infant's need for NICU admission due to premature gestation and observation period. Paula Dominguez verbalized her feelings of anxiety about labor process. Offered reassurance and verbalized support for her feelings. FOB joined conversation at the end.   Dennison Bulla, NNP-BC

## 2024-03-21 NOTE — Progress Notes (Signed)
 At bedside to review management.  Pt noted to have increase BP with one severe range.  Due to non-reactive NST, BPP completed today 6/8.  Reviewed with MFM agrees with plan to proceed toward delivery.  Pt placed on continuous monitoring, plan to restart Mag on admission to L&D.  Transfer order placed and team notified  FHT: 135bpm, minimal to moderate variability, +10x10 accels, no decels  Myna Hidalgo, DO Attending Obstetrician & Gynecologist, [facility] [employer], [facility]

## 2024-03-21 NOTE — Consult Note (Signed)
 [facility]  Prenatal Consult       03/21/2024  4:00 PM   I was asked by Dr. Charlotta Newton to consult on this patient for possible/anticipated 34 week preterm delivery. I had the pleasure of meeting with Ms. Paula Dominguez today. She is a 43 y.o. [id number] . Pregnancy complicated by cHTN, chronic headache, anxiety, depression, and nicotine patch for h/o smoking. She presents with super imposed pre eclampsia. She and her partner is/are expecting a baby girl. History is complicated by recent pregnancy loss at 24 weeks as well as a 76 month old infant death from SIDS. Mother and I discussed at length how concerned she is for the health of her baby. She states that she is still grieving the loss of her other babies and that she is very concerned about this infant needing to go to the NICU. I acknowledged these very valid concerns and we reviewed the common presentations of a baby born at 34 weeks and what to expect in the NICU. I also offered for our NICU psychologist to come and talk with her and she voiced interest in this option. I will arrange for this consult.  I explained that the neonatal intensive care team would be present for the delivery and outlined the likely delivery room course for this baby including routine resuscitation and NRP-guided approaches to the treatment of respiratory distress. We discussed the need for CPAP and oxygen. We discussed other common problems associated with prematurity including respiratory distress syndrome/CLD, apnea, feeding issues, temperature regulation, and infection risk. We briefly discussed IVH/PVL, ROP, and NEC and that these are complications associated with prematurity, but that by 30 weeks are uncommon.   We discussed the average length of stay, but I noted that the actual LOS would depend on the severity of problems encountered and response to treatments. We discussed visitation policies and the resources available while her child is in the  hospital.  We discussed the importance of good nutrition and various methods of providing nutrition (parenteral hyperalimentation, gavage feedings and/or oral feeding). We discussed the benefits of human milk. I encouraged breast feeding and pumping soon after birth and outlined resources that are available to support breast feeding. Mother is deciding if she will provide breast milk for this infant. We discussed the possibility of using donor breast milk as a bridge and she is undecided at this time.  Thank you for involving [place] in the care of this patient. A member of our team will be available should the family have additional questions.  Time for consultation: approximately 35 minutes of face-to-face time in discussion of the risks and medical care associated with preterm delivery.   Servando Salina, MD Attending Neonatologist

## 2024-03-22 ENCOUNTER — Inpatient Hospital Stay (HOSPITAL_COMMUNITY): Admitting: Anesthesiology

## 2024-03-22 ENCOUNTER — Encounter (HOSPITAL_COMMUNITY): Payer: Self-pay | Admitting: Obstetrics and Gynecology

## 2024-03-22 DIAGNOSIS — O99344 Other mental disorders complicating childbirth: Secondary | ICD-10-CM

## 2024-03-22 DIAGNOSIS — O368130 Decreased fetal movements, third trimester, not applicable or unspecified: Secondary | ICD-10-CM

## 2024-03-22 DIAGNOSIS — O1414 Severe pre-eclampsia complicating childbirth: Secondary | ICD-10-CM

## 2024-03-22 DIAGNOSIS — O365930 Maternal care for other known or suspected poor fetal growth, third trimester, not applicable or unspecified: Secondary | ICD-10-CM

## 2024-03-22 DIAGNOSIS — Z3A33 33 weeks gestation of pregnancy: Secondary | ICD-10-CM

## 2024-03-22 DIAGNOSIS — O1413 Severe pre-eclampsia, third trimester: Secondary | ICD-10-CM

## 2024-03-22 DIAGNOSIS — O99334 Smoking (tobacco) complicating childbirth: Secondary | ICD-10-CM

## 2024-03-22 DIAGNOSIS — O99820 Streptococcus B carrier state complicating pregnancy: Secondary | ICD-10-CM

## 2024-03-22 LAB — COMPREHENSIVE METABOLIC PANEL WITH GFR
ALT: 14 U/L (ref 0–44)
AST: 21 U/L (ref 15–41)
Albumin: 2.9 g/dL — ABNORMAL LOW (ref 3.5–5.0)
Alkaline Phosphatase: 58 U/L (ref 38–126)
Anion gap: 10 (ref 5–15)
BUN: 6 mg/dL (ref 6–20)
CO2: 20 mmol/L — ABNORMAL LOW (ref 22–32)
Calcium: 7.4 mg/dL — ABNORMAL LOW (ref 8.9–10.3)
Chloride: 105 mmol/L (ref 98–111)
Creatinine, Ser: 0.57 mg/dL (ref 0.44–1.00)
GFR, Estimated: >60 mL/min (ref >60)
Glucose, Bld: 90 mg/dL (ref 70–99)
Potassium: 3.4 mmol/L — ABNORMAL LOW (ref 3.5–5.1)
Sodium: 135 mmol/L (ref 135–145)
Total Bilirubin: 0.3 mg/dL (ref 0.0–1.2)
Total Protein: 5.9 g/dL — ABNORMAL LOW (ref 6.5–8.1)

## 2024-03-22 LAB — CBC WITH DIFFERENTIAL/PLATELET
Abs Immature Granulocytes: 0 10*3/uL (ref 0.00–0.07)
Basophils Absolute: 0.2 10*3/uL — ABNORMAL HIGH (ref 0.0–0.1)
Basophils Relative: 1 %
Eosinophils Absolute: 0 10*3/uL (ref 0.0–0.5)
Eosinophils Relative: 0 %
HCT: 29.3 % — ABNORMAL LOW (ref 36.0–46.0)
Hemoglobin: 9.8 g/dL — ABNORMAL LOW (ref 12.0–15.0)
Lymphocytes Relative: 18 %
Lymphs Abs: 2.9 10*3/uL (ref 0.7–4.0)
MCH: 33 pg (ref 26.0–34.0)
MCHC: 33.4 g/dL (ref 30.0–36.0)
MCV: 98.7 fL (ref 80.0–100.0)
Monocytes Absolute: 0.5 10*3/uL (ref 0.1–1.0)
Monocytes Relative: 3 %
Neutro Abs: 12.6 10*3/uL — ABNORMAL HIGH (ref 1.7–7.7)
Neutrophils Relative %: 78 %
Platelets: 228 10*3/uL (ref 150–400)
RBC: 2.97 MIL/uL — ABNORMAL LOW (ref 3.87–5.11)
RDW: 14 % (ref 11.5–15.5)
WBC: 16.1 10*3/uL — ABNORMAL HIGH (ref 4.0–10.5)
nRBC: 0 % (ref 0.0–0.2)
nRBC: 0 /100{WBCs}

## 2024-03-22 LAB — CULTURE, BETA STREP (GROUP B ONLY)

## 2024-03-22 LAB — RPR: RPR Ser Ql: NONREACTIVE

## 2024-03-22 MED ORDER — FENTANYL CITRATE (PF) 100 MCG/2ML IJ SOLN: 50.0000 ug | INTRAMUSCULAR | Status: DC | PRN

## 2024-03-22 MED ORDER — PHENYLEPHRINE 80 MCG/ML (10ML) SYRINGE FOR IV PUSH (FOR BLOOD PRESSURE SUPPORT): 80.0000 ug | PREFILLED_SYRINGE | INTRAVENOUS | Status: DC | PRN

## 2024-03-22 MED ORDER — BUTALBITAL-APAP-CAFFEINE 50-325-40 MG PO TABS
1.0000 | ORAL_TABLET | Freq: Four times a day (QID) | ORAL | Status: DC | PRN
  Administered 2024-03-22 – 2024-03-23 (×2): 1 via ORAL
  Filled 2024-03-22 (×2): qty 1

## 2024-03-22 MED ORDER — TERBUTALINE SULFATE 1 MG/ML IJ SOLN: 0.2500 mg | Freq: Once | INTRAMUSCULAR | Status: DC | PRN

## 2024-03-22 MED ORDER — SIMETHICONE 80 MG PO CHEW
80.0000 mg | CHEWABLE_TABLET | ORAL | Status: DC | PRN
  Administered 2024-03-23 (×2): 80 mg via ORAL
  Filled 2024-03-22 (×2): qty 1

## 2024-03-22 MED ORDER — FENTANYL-BUPIVACAINE-NACL 0.5-0.125-0.9 MG/250ML-% EP SOLN
EPIDURAL | Status: AC
  Filled 2024-03-22: qty 250

## 2024-03-22 MED ORDER — OXYTOCIN-SODIUM CHLORIDE 30-0.9 UT/500ML-% IV SOLN: 1.0000 m[IU]/min | INTRAVENOUS | Status: DC

## 2024-03-22 MED ORDER — PANTOPRAZOLE SODIUM 40 MG PO TBEC
40.0000 mg | DELAYED_RELEASE_TABLET | Freq: Two times a day (BID) | ORAL | Status: DC
  Administered 2024-03-22 – 2024-03-25 (×6): 40 mg via ORAL
  Filled 2024-03-22 (×13): qty 1

## 2024-03-22 MED ORDER — CLONAZEPAM 0.5 MG PO TABS
0.5000 mg | ORAL_TABLET | Freq: Three times a day (TID) | ORAL | Status: DC | PRN
  Administered 2024-03-22 – 2024-03-25 (×8): 0.5 mg via ORAL
  Filled 2024-03-22 (×9): qty 1

## 2024-03-22 MED ORDER — ONDANSETRON HCL 4 MG/2ML IJ SOLN: 4.0000 mg | INTRAMUSCULAR | Status: DC | PRN

## 2024-03-22 MED ORDER — BUTALBITAL-APAP-CAFFEINE 50-325-40 MG PO TABS
2.0000 | ORAL_TABLET | Freq: Four times a day (QID) | ORAL | Status: DC | PRN
  Filled 2024-03-22: qty 2

## 2024-03-22 MED ORDER — ACETAMINOPHEN 500 MG PO TABS
1000.0000 mg | ORAL_TABLET | Freq: Once | ORAL | Status: AC
  Administered 2024-03-22: 1000 mg via ORAL
  Filled 2024-03-22: qty 2

## 2024-03-22 MED ORDER — WITCH HAZEL-GLYCERIN EX PADS: 1.0000 | MEDICATED_PAD | CUTANEOUS | Status: DC | PRN

## 2024-03-22 MED ORDER — PRENATAL MULTIVITAMIN CH
1.0000 | ORAL_TABLET | Freq: Every day | ORAL | Status: DC
  Administered 2024-03-22 – 2024-03-25 (×4): 1 via ORAL
  Filled 2024-03-22 (×4): qty 1

## 2024-03-22 MED ORDER — OXYCODONE HCL 5 MG PO TABS
5.0000 mg | ORAL_TABLET | Freq: Four times a day (QID) | ORAL | Status: DC | PRN
  Administered 2024-03-22: 5 mg via ORAL
  Administered 2024-03-23 – 2024-03-24 (×5): 10 mg via ORAL
  Filled 2024-03-22 (×3): qty 2
  Filled 2024-03-22: qty 1
  Filled 2024-03-22 (×2): qty 2

## 2024-03-22 MED ORDER — CEFAZOLIN SODIUM-DEXTROSE 2-4 GM/100ML-% IV SOLN
2.0000 g | Freq: Once | INTRAVENOUS | Status: AC
  Administered 2024-03-22: 2 g via INTRAVENOUS
  Filled 2024-03-22: qty 100

## 2024-03-22 MED ORDER — SENNOSIDES-DOCUSATE SODIUM 8.6-50 MG PO TABS
2.0000 | ORAL_TABLET | Freq: Every day | ORAL | Status: DC
  Administered 2024-03-23 – 2024-03-25 (×3): 2 via ORAL
  Filled 2024-03-22 (×3): qty 2

## 2024-03-22 MED ORDER — ACETAMINOPHEN 325 MG PO TABS
650.0000 mg | ORAL_TABLET | ORAL | Status: DC | PRN
  Administered 2024-03-24 – 2024-03-25 (×3): 650 mg via ORAL
  Filled 2024-03-22 (×3): qty 2

## 2024-03-22 MED ORDER — DIBUCAINE (PERIANAL) 1 % EX OINT: 1.0000 | TOPICAL_OINTMENT | CUTANEOUS | Status: DC | PRN

## 2024-03-22 MED ORDER — FUROSEMIDE 20 MG PO TABS
20.0000 mg | ORAL_TABLET | Freq: Every day | ORAL | Status: DC
  Administered 2024-03-22: 20 mg via ORAL
  Filled 2024-03-22: qty 1

## 2024-03-22 MED ORDER — IBUPROFEN 600 MG PO TABS
600.0000 mg | ORAL_TABLET | Freq: Four times a day (QID) | ORAL | Status: DC
Start: 2024-03-22 — End: 2024-03-25
  Administered 2024-03-22 – 2024-03-25 (×13): 600 mg via ORAL
  Filled 2024-03-22 (×13): qty 1

## 2024-03-22 MED ORDER — COCONUT OIL OIL: 1.0000 | TOPICAL_OIL | Status: DC | PRN

## 2024-03-22 MED ORDER — LACTATED RINGERS IV SOLN: INTRAVENOUS | Status: AC

## 2024-03-22 MED ORDER — DIPHENHYDRAMINE HCL 25 MG PO CAPS
25.0000 mg | ORAL_CAPSULE | Freq: Four times a day (QID) | ORAL | Status: DC | PRN
  Administered 2024-03-22 – 2024-03-23 (×2): 25 mg via ORAL
  Filled 2024-03-22 (×2): qty 1

## 2024-03-22 MED ORDER — OXYCODONE HCL 5 MG PO TABS
5.0000 mg | ORAL_TABLET | Freq: Four times a day (QID) | ORAL | Status: DC | PRN
  Administered 2024-03-22: 5 mg via ORAL
  Filled 2024-03-22: qty 1

## 2024-03-22 MED ORDER — LACTATED RINGERS IV SOLN: 500.0000 mL | Freq: Once | INTRAVENOUS | Status: DC

## 2024-03-22 MED ORDER — BENZOCAINE-MENTHOL 20-0.5 % EX AERO: 1.0000 | INHALATION_SPRAY | CUTANEOUS | Status: DC | PRN

## 2024-03-22 MED ORDER — TRANEXAMIC ACID-NACL 1000-0.7 MG/100ML-% IV SOLN: 1000.0000 mg | INTRAVENOUS | Status: DC

## 2024-03-22 MED ORDER — EPHEDRINE 5 MG/ML INJ: 10.0000 mg | INTRAVENOUS | Status: DC | PRN

## 2024-03-22 MED ORDER — DIPHENHYDRAMINE HCL 50 MG/ML IJ SOLN: 12.5000 mg | INTRAMUSCULAR | Status: DC | PRN

## 2024-03-22 MED ORDER — LABETALOL HCL 200 MG PO TABS
200.0000 mg | ORAL_TABLET | Freq: Two times a day (BID) | ORAL | Status: DC
  Administered 2024-03-22 – 2024-03-24 (×4): 200 mg via ORAL
  Filled 2024-03-22 (×4): qty 1

## 2024-03-22 MED ORDER — FENTANYL-BUPIVACAINE-NACL 0.5-0.125-0.9 MG/250ML-% EP SOLN
12.0000 mL/h | EPIDURAL | Status: DC | PRN
  Administered 2024-03-22: 12 mL/h via EPIDURAL

## 2024-03-22 MED ORDER — LIDOCAINE HCL (PF) 1 % IJ SOLN
INTRAMUSCULAR | Status: DC | PRN
  Administered 2024-03-22 (×2): 5 mL via EPIDURAL

## 2024-03-22 MED ORDER — CAFFEINE 200 MG PO TABS
200.0000 mg | ORAL_TABLET | Freq: Four times a day (QID) | ORAL | Status: DC | PRN
  Administered 2024-03-22: 200 mg via ORAL
  Filled 2024-03-22 (×2): qty 1

## 2024-03-22 MED ORDER — ONDANSETRON HCL 4 MG PO TABS: 4.0000 mg | ORAL_TABLET | ORAL | Status: DC | PRN

## 2024-03-22 MED ORDER — TETANUS-DIPHTH-ACELL PERTUSSIS 5-2.5-18.5 LF-MCG/0.5 IM SUSY: 0.5000 mL | PREFILLED_SYRINGE | Freq: Once | INTRAMUSCULAR | Status: DC

## 2024-03-22 NOTE — Anesthesia Procedure Notes (Signed)
 Epidural Patient location during procedure: OB Start time: 03/22/2024 4:08 AM End time: 03/22/2024 4:17 AM  Staffing Anesthesiologist: Mal Amabile, MD Performed: anesthesiologist   Preanesthetic Checklist Completed: patient identified, IV checked, site marked, risks and benefits discussed, surgical consent, monitors and equipment checked, pre-op evaluation and timeout performed  Epidural Patient position: sitting Prep: DuraPrep and site prepped and draped Patient monitoring: continuous pulse ox and blood pressure Approach: midline Location: L3-L4 Injection technique: LOR air  Needle:  Needle type: Tuohy  Needle gauge: 17 G Needle length: 9 cm and 9 Needle insertion depth: 8 cm Catheter type: closed end flexible Catheter size: 19 Gauge Catheter at skin depth: 14 cm Test dose: negative and Other  Assessment Events: blood not aspirated, no cerebrospinal fluid, injection not painful, no injection resistance, no paresthesia and negative IV test  Additional Notes Patient identified. Risks and benefits discussed including failed block, incomplete  Pain control, post dural puncture headache, nerve damage, paralysis, blood pressure Changes, nausea, vomiting, reactions to medications-both toxic and allergic and post Partum back pain. All questions were answered. Patient expressed understanding and wished to proceed. Sterile technique was used throughout procedure. Epidural site was Dressed with sterile barrier dressing. No paresthesias, signs of intravascular injection Or signs of intrathecal spread were encountered.  Patient was more comfortable after the epidural was dosed. Please see RN's note for documentation of vital signs and FHR which are stable. Reason for block:procedure for pain

## 2024-03-22 NOTE — Anesthesia Postprocedure Evaluation (Signed)
 Anesthesia Post Note  Patient: Paula Dominguez  Procedure(s) Performed: AN AD HOC LABOR EPIDURAL     Patient location during evaluation: Mother Baby Anesthesia Type: Epidural Level of consciousness: awake and alert Pain management: pain level controlled Vital Signs Assessment: post-procedure vital signs reviewed and stable Respiratory status: spontaneous breathing, nonlabored ventilation and respiratory function stable Cardiovascular status: stable Postop Assessment: no headache, no backache, epidural receding, no apparent nausea or vomiting, patient able to bend at knees, able to ambulate and adequate PO intake Anesthetic complications: no   No notable events documented.  Last Vitals:  Vitals:   03/22/24 1010 03/22/24 1121  BP: 138/75 (!) 149/76  Pulse: 72 75  Resp: 18 16  Temp: 36.6 C   SpO2:      Last Pain:  Vitals:   03/22/24 1010  TempSrc: Oral  PainSc: 5    Pain Goal:                   [employer]

## 2024-03-22 NOTE — Discharge Summary (Addendum)
 Postpartum Discharge Summary  Date of Service updated: 03/25/24     Patient Name: Paula Dominguez DOB: Mar 27, 1981 MRN: [id number]  Date of admission: 03/19/2024 Delivery date:03/22/2024 Delivering provider: Wyn Forster Date of discharge: 03/25/2024  Admitting diagnosis: Severe preeclampsia [O14.10] Chronic hypertension with superimposed preeclampsia [O11.9] Intrauterine pregnancy: [redacted]w[redacted]d     Secondary diagnosis:  Principal Problem:   Severe preeclampsia Active Problems:   Iron deficiency anemia   Migraine headache   Moderate major depression (HCC)   Tobacco use disorder   Unwanted fertility   Chronic hypertension with superimposed preeclampsia  Additional problems: none    Discharge diagnosis: Preterm Pregnancy Delivered                                              Post partum procedures: lower extremity dopplers (pending) Augmentation: AROM and Pitocin Complications: Hx PPH x 4. Pt lost 650 ml with continued bleeding. TXA and pitocin started. JADA placed.   Hospital course: Induction of Labor With Vaginal Delivery   43 y.o. yo [id number] at [redacted]w[redacted]d was admitted to the hospital 03/19/2024 for induction of labor.  Indication for induction: Preeclampsia.  Patient had an labor course complicated by severe pre-eclampsia. Membrane Rupture Time/Date: 5:13 AM,03/22/2024  Delivery Method:Vaginal, Spontaneous Operative Delivery:N/A Episiotomy: None Lacerations:  None Details of delivery can be found in separate delivery note.  Patient had a postpartum course complicated by concern for lower extremity edema. Lower extremity dopplers are pending at time of this writing. Patient is discharged home 03/25/24.  Newborn Data: Birth date:03/22/2024 Birth time:7:54 AM Gender:Female Living status:Living Apgars:7 ,9  Weight:1810 g  Magnesium Sulfate received: Yes: Seizure prophylaxis BMZ received: Yes Rhophylac:N/A MMR:No T-DaP:Given prenatally Flu: Yes RSV Vaccine received:  No Transfusion:No  Immunizations received: Immunization History  Administered Date(s) Administered   Influenza, Seasonal, Injecte, Preservative Fre 12/12/2023   Influenza,inj,Quad PF,6+ Mos 11/24/2013, 09/10/2018, 02/19/2021   Pneumococcal Polysaccharide-23 11/21/2013   Tdap 11/21/2013, 09/10/2018, 05/13/2021, 02/13/2024    Physical exam  Vitals:   03/24/24 1919 03/25/24 0010 03/25/24 0433 03/25/24 0946  BP:  (!) 148/91 (!) 148/94 (!) 146/78  Pulse: 82 87 91 82  Resp: 16 16 16 16   Temp: 98.3 F (36.8 C) 98.4 F (36.9 C) 98.2 F (36.8 C) 98.2 F (36.8 C)  TempSrc: Oral Oral Oral Oral  SpO2: 100% 100% 100% 99%  Weight:      Height:       General: alert, cooperative, and no distress Lochia: appropriate Uterine Fundus: firm Incision: N/A DVT Evaluation: No evidence of DVT seen on physical exam. Negative Homan's sign. Trace to 1 plus bilateral edema present, dopplers pending Labs: Lab Results  Component Value Date   WBC 15.0 (H) 03/25/2024   HGB 9.4 (L) 03/25/2024   HCT 28.1 (L) 03/25/2024   MCV 97.9 03/25/2024   PLT 223 03/25/2024      Latest Ref Rng & Units 03/25/2024   12:37 AM  CMP  Glucose 70 - 99 mg/dL 308   BUN 6 - 20 mg/dL 15   Creatinine 6.57 - 1.00 mg/dL 8.46   Sodium 962 - 952 mmol/L 139   Potassium 3.5 - 5.1 mmol/L 3.6   Chloride 98 - 111 mmol/L 107   CO2 22 - 32 mmol/L 18   Calcium 8.9 - 10.3 mg/dL 9.0   Total Protein 6.5 - 8.1 g/dL 5.8  Total Bilirubin 0.0 - 1.2 mg/dL 0.3   Alkaline Phos 38 - 126 U/L 56   AST 15 - 41 U/L 23   ALT 0 - 44 U/L 17   Dopplers are negative  Edinburgh Score:    03/23/2024   10:11 PM  Edinburgh Postnatal Depression Scale Screening Tool  I have been able to laugh and see the funny side of things. 1  I have looked forward with enjoyment to things. 1  I have blamed myself unnecessarily when things went wrong. 3  I have been anxious or worried for no good reason. 3  I have felt scared or panicky for no good reason.  3  Things have been getting on top of me. 2  I have been so unhappy that I have had difficulty sleeping. 2  I have felt sad or miserable. 2  I have been so unhappy that I have been crying. 2  The thought of harming myself has occurred to me. 0  Edinburgh Postnatal Depression Scale Total 19   No data recorded  After visit meds:  Allergies as of 03/25/2024   No Known Allergies      Medication List     STOP taking these medications    aspirin 81 MG chewable tablet   butalbital-acetaminophen-caffeine 50-325-40 MG tablet Commonly known as: FIORICET   metoCLOPramide 10 MG tablet Commonly known as: REGLAN   Nicotine 21-14-7 MG/24HR Kit   progesterone 200 MG capsule Commonly known as: PROMETRIUM       TAKE these medications    acetaminophen 325 MG tablet Commonly known as: Tylenol Take 2 tablets (650 mg total) by mouth every 4 (four) hours as needed (for pain scale < 4).   clonazePAM 0.5 MG tablet Commonly known as: KLONOPIN Take 1 tablet (0.5 mg total) by mouth 2 (two) times daily as needed for anxiety. What changed: Another medication with the same name was added. Make sure you understand how and when to take each.   clonazePAM 0.5 MG tablet Commonly known as: KLONOPIN Take 1 tablet (0.5 mg total) by mouth 3 (three) times daily as needed (anxiety). What changed: You were already taking a medication with the same name, and this prescription was added. Make sure you understand how and when to take each.   cyclobenzaprine 10 MG tablet Commonly known as: FLEXERIL Take 1 tablet (10 mg total) by mouth 2 (two) times daily as needed for muscle spasms.   Excedrin Tension Headache 500-65 MG Tabs per tablet Generic drug: acetaminophen-caffeine Take 2 tablets by mouth every 6 (six) hours as needed (headache, aches and pains, fever).   ferrous sulfate 325 (65 FE) MG tablet Take 1 tablet (325 mg total) by mouth every other day.   furosemide 20 MG tablet Commonly known as:  LASIX Take 1 tablet (20 mg total) by mouth daily for 5 days.   hydrOXYzine 10 MG tablet Commonly known as: ATARAX Take 1 tablet (10 mg total) by mouth 3 (three) times daily as needed for anxiety.   ibuprofen 600 MG tablet Commonly known as: ADVIL Take 1 tablet (600 mg total) by mouth every 6 (six) hours.   labetalol 200 MG tablet Commonly known as: NORMODYNE Take 2 tablets (400 mg total) by mouth 3 (three) times daily.   lisinopril 10 MG tablet Commonly known as: ZESTRIL Take 1 tablet (10 mg total) by mouth daily.   magnesium oxide 400 MG tablet Commonly known as: MAG-OX Take 1 tablet (400 mg total) by mouth daily.  Misc. Devices Misc 1 Device by Does not apply route as needed. Ergonomic keyboard for carpal tunnel syndrome   Wrist Brace Misc 2 Devices by Does not apply route as needed.   nicotine polacrilex 4 MG lozenge Commonly known as: COMMIT Take 1 lozenge (4 mg total) by mouth as needed for smoking cessation.   omeprazole 40 MG capsule Commonly known as: PRILOSEC Take 1 capsule (40 mg total) by mouth in the morning and at bedtime.   oxyCODONE 5 MG immediate release tablet Commonly known as: Roxicodone Take 1 tablet (5 mg total) by mouth every 6 (six) hours as needed for severe pain (pain score 7-10).   PNV Prenatal Plus Multivitamin 27-1 MG Tabs Take 1 capsule by mouth daily.   polyethylene glycol powder 17 GM/SCOOP powder Commonly known as: GLYCOLAX/MIRALAX Take 17 g by mouth daily.   potassium chloride SA 20 MEQ tablet Commonly known as: KLOR-CON M Take 1 tablet (20 mEq total) by mouth 2 (two) times daily.         Discharge home in stable condition Infant Feeding: Breast Infant Disposition:NICU Discharge instruction: per After Visit Summary and Postpartum booklet. Activity: Advance as tolerated. Pelvic rest for 6 weeks.  Diet: low salt diet Future Appointments: Future Appointments  Date Time Provider Department Center  03/27/2024 11:00 AM Meta Hatchet, PA [facility] None  04/18/2024  8:00 AM Cozart, Neena Rhymes, LCSW [facility] None  05/03/2024 10:15 AM Teague Edwena Blow, PA-C [facility]  05/09/2024  8:00 AM Cozart, Neena Rhymes, LCSW [facility] None  05/30/2024  8:00 AM Cozart, Neena Rhymes, LCSW [facility] None   Follow up Visit:  Follow-up Information     Center for [facility] for Women. Go in 1 week(s).   Specialty: Obstetrics and Gynecology Why: Appt scheduled next week 4/1 Contact information: [address] [place] [address] [phone]        Center for [employer] at [facility] for Women. Schedule an appointment as soon as possible for a visit in 4 week(s).   Specialty: Obstetrics and Gynecology Why: postpartum Contact information: [address] [place] [address] [phone]                 Please schedule this patient for a In person postpartum visit in 6 weeks with the following provider: Any provider. Additional Postpartum F/U:BP check 1 week  High risk pregnancy complicated by: HTN and pre-eclampsia with severe features  Delivery mode:  Vaginal, Spontaneous Anticipated Birth Control:  Plans Interval BTL   03/25/2024 Warden Fillers, MD

## 2024-03-22 NOTE — Progress Notes (Addendum)
 Labor Progress Note Paula Dominguez is a 43 y.o. [id number] at [redacted]w[redacted]d presented for IOL for SIPE on CHTN with 6/10 BPP today.  S: Pt headache much improved. Resting comfortably in bed.   O:  BP (!) 146/77   Pulse 79   Temp 98.8 F (37.1 C) (Axillary)   Resp 17   Ht 5\' 7"  (1.702 m)   Wt 106.6 kg   LMP 07/16/2023   SpO2 98%   BMI 36.81 kg/m  EFM: 135 bpm/moderate variability/accelerations  CVE: Dilation: 2.5 Effacement (%): 80 Station: Ballotable Presentation: Vertex Exam by:: Fransisco Hertz, MD   A&P: 43 y.o. [id number] [redacted]w[redacted]d for  SIPE on CHTN with 6/10 BPP today.  #Labor: Progressing well. Cervical changes s/p cytotec. Will start pitocin 2x2.  #Pain: Well controlled. Headache improved.  #FWB: cat 1  #GBS positive; PCN #CHTN with SIPE w/SF: Mag infusion   Denton Ar, MD 12:35 AM  GME ATTESTATION:  Evaluation and management procedures were performed by the [facility] Medicine Resident under my supervision. I was immediately available for direct supervision, assistance and direction throughout this encounter.  I also confirm that I have verified the information documented in the resident's note, and that I have also personally reperformed the pertinent components of the physical exam and all of the medical decision making activities.  I have also made any necessary editorial changes.  Wyn Forster, MD OB Fellow, Faculty [facility], Center for [facility] 03/22/2024 12:59 AM

## 2024-03-22 NOTE — Plan of Care (Signed)
 Problem: Education: Goal: Knowledge of disease or condition will improve Outcome: Progressing Goal: Knowledge of the prescribed therapeutic regimen will improve Outcome: Progressing   Problem: Fluid Volume: Goal: Peripheral tissue perfusion will improve Outcome: Progressing   Problem: Clinical Measurements: Goal: Complications related to disease process, condition or treatment will be avoided or minimized Outcome: Progressing   Problem: Education: Goal: Knowledge of General Education information will improve Description: Including pain rating scale, medication(s)/side effects and non-pharmacologic comfort measures Outcome: Progressing   Problem: Health Behavior/Discharge Planning: Goal: Ability to manage health-related needs will improve Outcome: Progressing   Problem: Clinical Measurements: Goal: Ability to maintain clinical measurements within normal limits will improve Outcome: Progressing Goal: Will remain free from infection Outcome: Progressing Goal: Diagnostic test results will improve Outcome: Progressing Goal: Respiratory complications will improve Outcome: Progressing Goal: Cardiovascular complication will be avoided Outcome: Progressing   Problem: Activity: Goal: Risk for activity intolerance will decrease Outcome: Progressing   Problem: Nutrition: Goal: Adequate nutrition will be maintained Outcome: Progressing   Problem: Coping: Goal: Level of anxiety will decrease Outcome: Progressing   Problem: Elimination: Goal: Will not experience complications related to bowel motility Outcome: Progressing Goal: Will not experience complications related to urinary retention Outcome: Progressing   Problem: Pain Managment: Goal: General experience of comfort will improve and/or be controlled Outcome: Progressing   Problem: Safety: Goal: Ability to remain free from injury will improve Outcome: Progressing   Problem: Skin Integrity: Goal: Risk for impaired  skin integrity will decrease Outcome: Progressing   Problem: Education: Goal: Knowledge of disease or condition will improve Outcome: Progressing Goal: Knowledge of the prescribed therapeutic regimen will improve Outcome: Progressing Goal: Individualized Educational Video(s) Outcome: Progressing   Problem: Clinical Measurements: Goal: Complications related to the disease process, condition or treatment will be avoided or minimized Outcome: Progressing   Problem: Education: Goal: Knowledge of condition will improve Outcome: Progressing Goal: Individualized Educational Video(s) Outcome: Progressing Goal: Individualized Newborn Educational Video(s) Outcome: Progressing   Problem: Activity: Goal: Will verbalize the importance of balancing activity with adequate rest periods Outcome: Progressing Goal: Ability to tolerate increased activity will improve Outcome: Progressing   Problem: Coping: Goal: Ability to identify and utilize available resources and services will improve Outcome: Progressing   Problem: Life Cycle: Goal: Chance of risk for complications during the postpartum period will decrease Outcome: Progressing   Problem: Role Relationship: Goal: Ability to demonstrate positive interaction with newborn will improve Outcome: Progressing   Problem: Skin Integrity: Goal: Demonstration of wound healing without infection will improve Outcome: Progressing   Problem: Education: Goal: Knowledge of disease or condition will improve Outcome: Progressing Goal: Knowledge of the prescribed therapeutic regimen will improve Outcome: Progressing   Problem: Fluid Volume: Goal: Peripheral tissue perfusion will improve Outcome: Progressing   Problem: Clinical Measurements: Goal: Complications related to disease process, condition or treatment will be avoided or minimized Outcome: Progressing   Problem: Education: Goal: Knowledge of General Education information will  improve Description: Including pain rating scale, medication(s)/side effects and non-pharmacologic comfort measures Outcome: Progressing   Problem: Health Behavior/Discharge Planning: Goal: Ability to manage health-related needs will improve Outcome: Progressing   Problem: Clinical Measurements: Goal: Ability to maintain clinical measurements within normal limits will improve Outcome: Progressing Goal: Will remain free from infection Outcome: Progressing Goal: Diagnostic test results will improve Outcome: Progressing Goal: Respiratory complications will improve Outcome: Progressing Goal: Cardiovascular complication will be avoided Outcome: Progressing   Problem: Activity: Goal: Risk for activity intolerance will decrease Outcome: Progressing   Problem:  Nutrition: Goal: Adequate nutrition will be maintained Outcome: Progressing   Problem: Coping: Goal: Level of anxiety will decrease Outcome: Progressing   Problem: Elimination: Goal: Will not experience complications related to bowel motility Outcome: Progressing Goal: Will not experience complications related to urinary retention Outcome: Progressing   Problem: Pain Managment: Goal: General experience of comfort will improve and/or be controlled Outcome: Progressing   Problem: Safety: Goal: Ability to remain free from injury will improve Outcome: Progressing   Problem: Skin Integrity: Goal: Risk for impaired skin integrity will decrease Outcome: Progressing   Problem: Education: Goal: Knowledge of disease or condition will improve Outcome: Progressing Goal: Knowledge of the prescribed therapeutic regimen will improve Outcome: Progressing Goal: Individualized Educational Video(s) Outcome: Progressing   Problem: Clinical Measurements: Goal: Complications related to the disease process, condition or treatment will be avoided or minimized Outcome: Progressing   Problem: Education: Goal: Knowledge of condition  will improve Outcome: Progressing Goal: Individualized Educational Video(s) Outcome: Progressing Goal: Individualized Newborn Educational Video(s) Outcome: Progressing   Problem: Activity: Goal: Will verbalize the importance of balancing activity with adequate rest periods Outcome: Progressing Goal: Ability to tolerate increased activity will improve Outcome: Progressing   Problem: Coping: Goal: Ability to identify and utilize available resources and services will improve Outcome: Progressing   Problem: Life Cycle: Goal: Chance of risk for complications during the postpartum period will decrease Outcome: Progressing   Problem: Role Relationship: Goal: Ability to demonstrate positive interaction with newborn will improve Outcome: Progressing   Problem: Skin Integrity: Goal: Demonstration of wound healing without infection will improve Outcome: Progressing

## 2024-03-22 NOTE — Progress Notes (Signed)
 POSTPARTUM PROGRESS NOTE  Assessed patient at bedside for JADA removal. Blood not to canister. Had been off suction and balloon deflated for 30 minutes. I removed JADA without any difficulty. Fundal rub after firm, without bleeding or clots. HDS. Will give Ancef 2g x1. Can now move to PP.  Joanne Gavel, MD OB Fellow 03/22/2024 9:34 AM

## 2024-03-22 NOTE — Lactation Note (Addendum)
 This note was copied from a baby's chart.  NICU Lactation Consultation Note  Patient Name: Paula Dominguez [id number] Date: 03/22/2024 Age:44 hours  Reason for consult: Initial assessment; NICU baby; Preterm <34wks; Other (Comment); Infant < 5lbs; 1st time breastfeeding (cHTN, tobacco use, AMA)  SUBJECTIVE Visited with family of 45 27/24 weeks old NICU female; baby "Paula Dominguez" was admitted due to prematurity. Paula Dominguez is a P9 but this is her first time breastfeeding. Her plan is to pump while at the hospital but she'll be switching to formula once baby is no longer a preemie. Assisted with the fitting of a pumping band in size "L" for hands on pumping and her next pumping session, she easily got enough colostrum to collect, praised her for her efforts. This LC proceeded to wash her pump pieces afterwards. Reviewed pumping schedule, pumping log, lactogenesis II/III and anticipatory guidelines.   OBJECTIVE Infant data: Mother's Current Feeding Choice: Breast Milk and Formula  O2 Device: CPAP FiO2 (%): 21 %  Maternal data: [id number] Vaginal, Spontaneous Has patient been taught Hand Expression?: Yes Hand Expression Comments: colostrum noted Significant Breast History:: moderate breast changes during the pregnancy Current breast feeding challenges:: NICU admission Does the patient have breastfeeding experience prior to this delivery?: No Pumping frequency: initiated pumping prior baby's birth in the L&D suites Pumped volume: 15 mL Flange Size: 21 Hands-free pumping top sizes: Large [name] Risk factor for low/delayed milk supply:: prematurity, < 4 lbs, AMA, Pre-E (on Mag), PPH of 750 cc, infant separation  WIC Program: Yes WIC Referral Sent?: Yes What county?: [place]  ASSESSMENT Infant: Feeding Status: Scheduled 8-11-2-5  Maternal: Milk volume: Normal  INTERVENTIONS/PLAN Interventions: Interventions: Breast feeding basics reviewed; DEBP; Education; CDC Guidelines for Breast  Pump Cleaning; NICU Pumping Log; LC Services brochure Tools: Pump; Flanges; Hands-free pumping top; Coconut oil Pump Education: Setup, frequency, and cleaning; Milk Storage  Plan: STS once able to Massage and hand express both breasts prior/after pumping; coconut oil prior pumping Pump both breasts on initiate mode every 3 hours for 15 minutes, ideally 8 pumping sessions/24 hours  Visitors present. All questions and concerns answered, family to contact [facility] services PRN.  Consult Status: NICU follow-up NICU Follow-up type: New admission follow up   Paula Dominguez Paula Dominguez 03/22/2024, 5:44 PM

## 2024-03-22 NOTE — Progress Notes (Addendum)
 Labor Progress Note Paula Dominguez is a 43 y.o. [id number] at [redacted]w[redacted]d presented for IOL for SIPE on CHTN with 6/10 BPP. S: Pt reports headache now 13/10 while conversational in bed. S/p 5 oxy, 200 caffeine, 0.5 ativan. S/p epidural.   O:  BP 137/73   Pulse 75   Temp 98.4 F (36.9 C) (Oral)   Resp 17   Ht 5\' 7"  (1.702 m)   Wt 106.6 kg   LMP 07/16/2023   SpO2 98%   BMI 36.81 kg/m  EFM: 135bpm/moderate variability/no accels   CVE: Dilation: 3 Effacement (%): 80, 90 Station: -2 Presentation: Vertex Exam by:: Fransisco Hertz, MD   A&P: 43 y.o. [id number] [redacted]w[redacted]d IOL for SIPE on CHTN with 6/10 BPP. #Labor: Progressing well. Continue pitocin. AROM with clear fluid. Wants cervical checks Q4H.  #Pain: S/p epidural. Pt reports headache 13/10 while conversational in bed. Requests cocktail given previously of 5 oxy, 200 caffeine, 0.5 ativan. #FWB: Cat 1 #GBS positive; PCN #CHTN with SIPE w/SF: Mag infusion   Denton Ar, MD 5:31 AM  GME ATTESTATION:  Evaluation and management procedures were performed by the [facility] Medicine Resident under my supervision. I was immediately available for direct supervision, assistance and direction throughout this encounter.  I also confirm that I have verified the information documented in the resident's note, and that I have also personally reperformed the pertinent components of the physical exam and all of the medical decision making activities.  I have also made any necessary editorial changes.  Wyn Forster, MD OB Fellow, Faculty [facility], Center for [facility] 03/22/2024 9:08 PM

## 2024-03-22 NOTE — Anesthesia Preprocedure Evaluation (Signed)
 Anesthesia Evaluation  Patient identified by MRN, date of birth, ID band Patient awake    Reviewed: Allergy & Precautions, Patient's Chart, lab work & pertinent test results  Airway Mallampati: III       Dental no notable dental hx.    Pulmonary former smoker   Pulmonary exam normal        Cardiovascular hypertension, Pt. on medications and Pt. on home beta blockers Normal cardiovascular exam Rhythm:Regular     Neuro/Psych  Headaches PSYCHIATRIC DISORDERS Anxiety Depression       GI/Hepatic Neg liver ROS,GERD  ,,  Endo/Other  Obesity  Renal/GU negative Renal ROS  negative genitourinary   Musculoskeletal negative musculoskeletal ROS (+)    Abdominal  (+) + obese  Peds  Hematology  (+) Blood dyscrasia, anemia   Anesthesia Other Findings   Reproductive/Obstetrics (+) Pregnancy AMA Hx/o post partum hemorrhage Grand multiparity                             Anesthesia Physical Anesthesia Plan  ASA: 3  Anesthesia Plan: Epidural   Post-op Pain Management:    Induction:   PONV Risk Score and Plan:   Airway Management Planned: Natural Airway  Additional Equipment: Fetal Monitoring and None  Intra-op Plan:   Post-operative Plan:   Informed Consent: I have reviewed the patients History and Physical, chart, labs and discussed the procedure including the risks, benefits and alternatives for the proposed anesthesia with the patient or authorized representative who has indicated his/her understanding and acceptance.       Plan Discussed with: Anesthesiologist  Anesthesia Plan Comments:        Anesthesia Quick Evaluation

## 2024-03-23 LAB — CBC
HCT: 26.8 % — ABNORMAL LOW (ref 36.0–46.0)
Hemoglobin: 9 g/dL — ABNORMAL LOW (ref 12.0–15.0)
MCH: 33.2 pg (ref 26.0–34.0)
MCHC: 33.6 g/dL (ref 30.0–36.0)
MCV: 98.9 fL (ref 80.0–100.0)
Platelets: 202 10*3/uL (ref 150–400)
RBC: 2.71 MIL/uL — ABNORMAL LOW (ref 3.87–5.11)
RDW: 14.2 % (ref 11.5–15.5)
WBC: 14.6 10*3/uL — ABNORMAL HIGH (ref 4.0–10.5)
nRBC: 0.1 % (ref 0.0–0.2)

## 2024-03-23 MED ORDER — POTASSIUM CHLORIDE CRYS ER 20 MEQ PO TBCR
20.0000 meq | EXTENDED_RELEASE_TABLET | Freq: Every day | ORAL | Status: DC
  Administered 2024-03-23 – 2024-03-25 (×3): 20 meq via ORAL
  Filled 2024-03-23 (×3): qty 1

## 2024-03-23 MED ORDER — FUROSEMIDE 20 MG PO TABS
20.0000 mg | ORAL_TABLET | Freq: Every day | ORAL | Status: DC
  Administered 2024-03-23 – 2024-03-25 (×3): 20 mg via ORAL
  Filled 2024-03-23 (×3): qty 1

## 2024-03-23 MED ORDER — NICOTINE 21 MG/24HR TD PT24
21.0000 mg | MEDICATED_PATCH | Freq: Every day | TRANSDERMAL | Status: DC
Start: 2024-03-23 — End: 2024-03-25
  Administered 2024-03-23 – 2024-03-25 (×3): 21 mg via TRANSDERMAL
  Filled 2024-03-23 (×3): qty 1

## 2024-03-23 MED ORDER — FERROUS SULFATE 325 (65 FE) MG PO TABS
325.0000 mg | ORAL_TABLET | ORAL | Status: DC
  Administered 2024-03-23 – 2024-03-25 (×2): 325 mg via ORAL
  Filled 2024-03-23 (×3): qty 1

## 2024-03-23 NOTE — Plan of Care (Signed)
  Problem: Education: Goal: Knowledge of disease or condition will improve Outcome: Progressing Goal: Knowledge of the prescribed therapeutic regimen will improve Outcome: Progressing   Problem: Fluid Volume: Goal: Peripheral tissue perfusion will improve Outcome: Progressing   Problem: Clinical Measurements: Goal: Complications related to disease process, condition or treatment will be avoided or minimized Outcome: Progressing   Problem: Education: Goal: Knowledge of General Education information will improve Description: Including pain rating scale, medication(s)/side effects and non-pharmacologic comfort measures Outcome: Progressing   Problem: Health Behavior/Discharge Planning: Goal: Ability to manage health-related needs will improve Outcome: Progressing   Problem: Clinical Measurements: Goal: Ability to maintain clinical measurements within normal limits will improve Outcome: Progressing Goal: Will remain free from infection Outcome: Progressing Goal: Diagnostic test results will improve Outcome: Progressing Goal: Respiratory complications will improve Outcome: Progressing Goal: Cardiovascular complication will be avoided Outcome: Progressing   Problem: Activity: Goal: Risk for activity intolerance will decrease Outcome: Progressing   Problem: Nutrition: Goal: Adequate nutrition will be maintained Outcome: Progressing   Problem: Coping: Goal: Level of anxiety will decrease Outcome: Progressing   Problem: Elimination: Goal: Will not experience complications related to bowel motility Outcome: Progressing Goal: Will not experience complications related to urinary retention Outcome: Progressing   Problem: Pain Managment: Goal: General experience of comfort will improve and/or be controlled Outcome: Progressing   Problem: Safety: Goal: Ability to remain free from injury will improve Outcome: Progressing   Problem: Skin Integrity: Goal: Risk for impaired  skin integrity will decrease Outcome: Progressing   Problem: Education: Goal: Knowledge of disease or condition will improve Outcome: Progressing Goal: Knowledge of the prescribed therapeutic regimen will improve Outcome: Progressing Goal: Individualized Educational Video(s) Outcome: Progressing   Problem: Clinical Measurements: Goal: Complications related to the disease process, condition or treatment will be avoided or minimized Outcome: Progressing   Problem: Education: Goal: Knowledge of condition will improve Outcome: Progressing Goal: Individualized Educational Video(s) Outcome: Progressing Goal: Individualized Newborn Educational Video(s) Outcome: Progressing   Problem: Activity: Goal: Will verbalize the importance of balancing activity with adequate rest periods Outcome: Progressing Goal: Ability to tolerate increased activity will improve Outcome: Progressing   Problem: Coping: Goal: Ability to identify and utilize available resources and services will improve Outcome: Progressing   Problem: Life Cycle: Goal: Chance of risk for complications during the postpartum period will decrease Outcome: Progressing   Problem: Role Relationship: Goal: Ability to demonstrate positive interaction with newborn will improve Outcome: Progressing   Problem: Skin Integrity: Goal: Demonstration of wound healing without infection will improve Outcome: Progressing

## 2024-03-23 NOTE — Lactation Note (Signed)
 This note was copied from a baby's chart.  NICU Lactation Consultation Note  Patient Name: Paula Dominguez [id number] Date: 03/23/2024 Age:44 hours  Reason for consult: Follow-up assessment; NICU baby; Preterm <34wks; Infant < 5lbs; 1st time breastfeeding; Other (Comment) (cHTN, tobacco use, AMA)  SUBJECTIVE Visited with family of 59 1/57 weeks old AGA NICU female; Paula Dominguez reported she's been pumping but not consistently. Her goal is just to get the colostrum for baby while at the hospital because she knows she needs it. She's planning on pumping some more once she gets back to her room, she's still undecided about quitting, she asked for some tips about "how to stop" because her breasts were already getting tender. MGF suggested she should pump to have some relief from milk/colostrum build up, LC agreed and let her know that we will still support her feeding choice even if she decides to stop pumping. Revised tips for engorgement prevention/treatment.   OBJECTIVE Infant data: Mother's Current Feeding Choice: Breast Milk and Formula  O2 Device: Room Air FiO2 (%): 21 %  Infant feeding assessment IDFTS - Readiness: 4   Maternal data: [id number] Vaginal, Spontaneous Has patient been taught Hand Expression?: Yes Hand Expression Comments: colostrum noted Significant Breast History:: moderate breast changes during the pregnancy Current breast feeding challenges:: NICU admission Does the patient have breastfeeding experience prior to this delivery?: No Pumping frequency: 2 times/24 hours Pumped volume: 15 mL Flange Size: 21 Hands-free pumping top sizes: Large Paula Dominguez) Risk factor for low/delayed milk supply:: prematurity, < 4 lbs, AMA, Pre-E (on Mag), PPH of 750 cc, infant separation  WIC Program: Yes WIC Referral Sent?: Yes What county?: [place]  ASSESSMENT Infant: Feeding Status: Scheduled 8-11-2-5  Maternal: Milk volume:  Normal  INTERVENTIONS/PLAN Interventions: Interventions: Breast feeding basics reviewed; Coconut oil; DEBP; Education Tools: Pump; Flanges; Hands-free pumping top; Coconut oil Pump Education: Setup, frequency, and cleaning; Milk Storage  Plan: STS once able to Massage and hand express both breasts prior/after pumping; coconut oil prior pumping Pump both breasts on initiate mode every 3 hours for 15 minutes, ideally 8 pumping sessions/24 hours   Visitors present. All questions and concerns answered, family to contact [facility] services PRN.  Consult Status: NICU follow-up NICU Follow-up type: Maternal D/C visit   Paula Dominguez 03/23/2024, 5:57 PM

## 2024-03-23 NOTE — Progress Notes (Signed)
 Daily Postpartum Note  Admission Date: 03/19/2024 Current Date: 03/23/2024 7:52 AM  Paula Dominguez is a 43 y.o. [id number] PPD#1 s/p SVD/intact perineum admitted for severe pre-eclampsia superimposed on CHTN  Pregnancy complicated by: Patient Active Problem List   Diagnosis Date Noted   Severe preeclampsia 03/19/2024   Chronic hypertension with superimposed preeclampsia 03/19/2024   Unwanted fertility 02/27/2024   Tobacco use disorder 02/13/2024   History of preterm delivery, currently pregnant 01/30/2024   Bilateral carpal tunnel syndrome 01/11/2024   Tinea versicolor 12/12/2023   Supervision of high risk pregnancy, antepartum 10/06/2023   Chronic hypertension during pregnancy, antepartum 08/13/2021   AMA (advanced maternal age) multigravida 35+ 07/07/2021   Insomnia due to medical condition 01/08/2019   Grand multiparity 07/11/2018   Iron deficiency anemia 01/08/2018   Migraine headache 01/12/2016   Moderate major depression (HCC) 01/12/2016   Bipolar disorder (HCC) 01/12/2016   History of postpartum depression 01/09/2014   History of pre-eclampsia 11/19/2013   Overnight/24hr events:  Patient still endorsing HAs and b/l LE edema  Subjective:  Patient still endorsing anxiety and HAs  Objective:    Current Vital Signs 24h Vital Sign Ranges  T 97.7 F (36.5 C) Temp  Avg: 97.9 F (36.6 C)  Min: 97.7 F (36.5 C)  Max: 98.2 F (36.8 C)  BP (!) 104/57 BP  Min: 104/57  Max: 157/86  HR 75 Pulse  Avg: 75.2  Min: 71  Max: 90  RR 19 Resp  Avg: 17.9  Min: 16  Max: 20  SaO2 100 % Room Air SpO2  Avg: 99.3 %  Min: 99 %  Max: 100 %       24 Hour I/O Current Shift I/O  Time Ins Outs 03/28 0701 - 03/29 0700 In: 4717.7 [P.O.:2760; I.V.:1637.7] Out: 2225 [Urine:1475] No intake/output data recorded.   Patient Vitals for the past 24 hrs:  BP Temp Temp src Pulse Resp SpO2  03/23/24 0457 (!) 104/57 97.7 F (36.5 C) Oral 75 19 100 %  03/22/24 2308 (!) 157/86 97.9 F (36.6 C)  Oral 90 20 99 %  03/22/24 2018 (!) 146/77 98.2 F (36.8 C) Oral 79 18 99 %  03/22/24 1800 -- -- -- -- 19 --  03/22/24 1700 -- -- -- -- 18 --  03/22/24 1628 (!) 147/73 98 F (36.7 C) Oral 71 17 99 %  03/22/24 1500 -- -- -- -- 17 --  03/22/24 1400 -- -- -- -- 18 --  03/22/24 1300 -- -- -- -- 18 --  03/22/24 1200 -- -- -- -- 17 --  03/22/24 1121 (!) 149/76 -- -- 75 16 --  03/22/24 1010 138/75 97.9 F (36.6 C) Oral 72 18 --  03/22/24 0946 131/78 -- -- 73 -- --  03/22/24 0931 130/83 -- -- 76 -- --  03/22/24 0916 127/75 -- -- 71 -- --  03/22/24 0902 123/79 -- -- 72 18 --  03/22/24 0846 134/85 -- -- 76 -- --  03/22/24 0831 135/79 -- -- 74 -- --  03/22/24 0819 (!) 147/128 -- -- 71 18 --  03/22/24 0801 133/82 -- -- 78 -- --   Physical exam: General: Well nourished, well developed female in no acute distress. Abdomen: nttp Respiratory: no resp distress Extremities: 1+ b/l LE edema symmetric Skin: Warm and dry.   Medications: Current Facility-Administered Medications  Medication Dose Route Frequency Provider Last Rate Last Admin   acetaminophen (TYLENOL) tablet 650 mg  650 mg Oral Q4H PRN Denton Ar, MD  benzocaine-Menthol (DERMOPLAST) 20-0.5 % topical spray 1 Application  1 Application Topical PRN Denton Ar, MD       clonazePAM Scarlette Calico) tablet 0.5 mg  0.5 mg Oral TID PRN Myna Hidalgo, DO   0.5 mg at 03/23/24 0007   coconut oil  1 Application Topical PRN Denton Ar, MD       witch hazel-glycerin (TUCKS) pad 1 Application  1 Application Topical PRN Denton Ar, MD       And   dibucaine (NUPERCAINAL) 1 % rectal ointment 1 Application  1 Application Rectal PRN Denton Ar, MD       diphenhydrAMINE (BENADRYL) capsule 25 mg  25 mg Oral Q6H PRN Denton Ar, MD   25 mg at 03/22/24 2105   ferrous sulfate tablet 325 mg  325 mg Oral Lottie Mussel, MD       furosemide (LASIX) tablet 20 mg  20 mg Oral Daily [place] Bing, MD        ibuprofen (ADVIL) tablet 600 mg  600 mg Oral Q6H Denton Ar, MD   600 mg at 03/23/24 1610   labetalol (NORMODYNE) tablet 200 mg  200 mg Oral BID Myna Hidalgo, DO   200 mg at 03/22/24 2102   lactated ringers infusion   Intravenous Continuous Myna Hidalgo, DO 10 mL/hr at 03/22/24 1121 New Bag at 03/22/24 1121   magnesium sulfate 40 grams in SWI 1000 mL OB infusion  2 g/hr Intravenous Continuous [place] Bing, MD 50 mL/hr at 03/22/24 1120 2 g/hr at 03/22/24 1120   ondansetron (ZOFRAN) tablet 4 mg  4 mg Oral Q4H PRN Denton Ar, MD       Or   ondansetron [facility]) injection 4 mg  4 mg Intravenous Q4H PRN Denton Ar, MD       oxyCODONE (Oxy IR/ROXICODONE) immediate release tablet 5-10 mg  5-10 mg Oral Q6H PRN [place] Bing, MD   10 mg at 03/23/24 0302   pantoprazole (PROTONIX) EC tablet 40 mg  40 mg Oral BID Myna Hidalgo, DO   40 mg at 03/22/24 1611   potassium chloride SA (KLOR-CON M) CR tablet 20 mEq  20 mEq Oral Daily [place] Bing, MD       prenatal multivitamin tablet 1 tablet  1 tablet Oral Q1200 Denton Ar, MD   1 tablet at 03/22/24 1324   senna-docusate (Senokot-S) tablet 2 tablet  2 tablet Oral Daily Denton Ar, MD       simethicone [facility]) chewable tablet 80 mg  80 mg Oral PRN Denton Ar, MD   80 mg at 03/23/24 9604   Tdap (BOOSTRIX) injection 0.5 mL  0.5 mL Intramuscular Once Denton Ar, MD        Labs:  Recent Labs  Lab 03/21/24 0423 03/22/24 0310 03/23/24 0502  WBC 15.6* 16.1* 14.6*  HGB 9.7* 9.8* 9.0*  HCT 29.2* 29.3* 26.8*  PLT 227 228 202    Recent Labs  Lab 03/20/24 0503 03/21/24 0423 03/22/24 1018  NA 134* 137 135  K 3.7 3.5 3.4*  CL 105 111 105  CO2 19* 20* 20*  BUN 5* 6 6  CREATININE 0.58 0.68 0.57  CALCIUM 7.8* 8.0* 7.4*  PROT 6.3* 6.1* 5.9*  BILITOT 0.3 0.3 0.3  ALKPHOS 65 58 58  ALT 12 12 14   AST 20 23 21   GLUCOSE 163* 128* 90    Radiology:  none  Assessment & Plan:   Patient stable *PP:  pumping for colostrum only but doesn't plan to do after that. No BTL. O POS *Severe pre-eclampsia: Mg just turned off. Watch BPs on lower labetalol dose *Anxiety: on PRN klonopin and she states she has a therapist already *Neuro: pt on firocet daily. I d/w her that this isnt recommended due to risk of rebound and worsening HAs and recommend d/c. I told her s/s continue then can put her on regimen that's better for maintenance therapy and may consider imaging.  *PPx: SCDs *FEN/GI: regular diet, SLIV *Dispo: potentially tomorrow.   Cornelia Copa MD Attending Center for [facility] (Faculty Practice) GYN Consult Phone: [phone] (M-F, [phone]) & [phone]  (Off hours, weekends, holidays)

## 2024-03-24 MED ORDER — LABETALOL HCL 200 MG PO TABS
200.0000 mg | ORAL_TABLET | Freq: Once | ORAL | Status: AC
  Administered 2024-03-24: 200 mg via ORAL
  Filled 2024-03-24: qty 1

## 2024-03-24 MED ORDER — OXYCODONE HCL 5 MG PO TABS
5.0000 mg | ORAL_TABLET | ORAL | Status: DC | PRN
  Administered 2024-03-24 – 2024-03-25 (×5): 10 mg via ORAL
  Filled 2024-03-24 (×5): qty 2
  Filled 2024-03-24: qty 1

## 2024-03-24 MED ORDER — LABETALOL HCL 200 MG PO TABS
400.0000 mg | ORAL_TABLET | Freq: Two times a day (BID) | ORAL | Status: DC
Start: 2024-03-24 — End: 2024-03-24

## 2024-03-24 MED ORDER — LISINOPRIL 10 MG PO TABS
10.0000 mg | ORAL_TABLET | Freq: Every day | ORAL | Status: DC
  Administered 2024-03-24 – 2024-03-25 (×2): 10 mg via ORAL
  Filled 2024-03-24 (×2): qty 1

## 2024-03-24 MED ORDER — LABETALOL HCL 200 MG PO TABS
400.0000 mg | ORAL_TABLET | Freq: Three times a day (TID) | ORAL | Status: DC
  Administered 2024-03-24 – 2024-03-25 (×3): 400 mg via ORAL
  Filled 2024-03-24 (×3): qty 2

## 2024-03-24 MED ORDER — MEDROXYPROGESTERONE ACETATE 150 MG/ML IM SUSP
150.0000 mg | Freq: Once | INTRAMUSCULAR | Status: AC
  Administered 2024-03-24: 150 mg via INTRAMUSCULAR
  Filled 2024-03-24: qty 1

## 2024-03-24 NOTE — Progress Notes (Signed)
 POSTPARTUM PROGRESS NOTE  PPD #2  Subjective:  Paula Dominguez is a 43 y.o. [id number] s/p NSVD at [redacted]w[redacted]d. Today she notes she is doing better, rates her headache down to a 7/10. She denies any problems with ambulating, voiding or po intake. Denies nausea or vomiting. She has had a BM.  Pain is tolerable, notes cramping.  Lochia minimal Denies fever/chills/chest pain/SOB.    Objective: Blood pressure (!) 150/93, pulse 82, temperature 98.1 F (36.7 C), resp. rate 18, height 5\' 7"  (1.702 m), weight 106.6 kg, last menstrual period 07/16/2023, SpO2 98%, unknown if currently breastfeeding.  Physical Exam:  General: alert, cooperative and no distress Chest: no respiratory distress, CTAB Heart: regular rate and rhythm Abdomen: soft, nontender Uterine Fundus: firm, appropriately tender DVT Evaluation: No calf swelling or tenderness Extremities: 2+ edema Skin: warm, dry  No results found for this or any previous visit (from the past 24 hours).  Assessment/Plan: Paula Dominguez is a 43 y.o. [id number] s/p NSVD at [redacted]w[redacted]d PPD#2 complicated by: 1) Preeclampsia with severe features -s/p Mag -currently on Lasix and Labetalol -plan to add Lisinopril which she has been on prior to pregnancy  2) Postpartum care -meeting milestones appropriately -Anxiety- continue home Klonopin  Contraception: [place]  Dispo: Continue postpartum care, possible discharge home later today if BP improved   LOS: 5 days   Myna Hidalgo, DO Faculty Attending, Center for [facility] 03/24/2024, 10:13 AM

## 2024-03-24 NOTE — Plan of Care (Signed)
  Problem: Education: Goal: Knowledge of disease or condition will improve Outcome: Progressing Goal: Knowledge of the prescribed therapeutic regimen will improve Outcome: Progressing   Problem: Fluid Volume: Goal: Peripheral tissue perfusion will improve Outcome: Progressing   Problem: Clinical Measurements: Goal: Complications related to disease process, condition or treatment will be avoided or minimized Outcome: Progressing   Problem: Education: Goal: Knowledge of General Education information will improve Description: Including pain rating scale, medication(s)/side effects and non-pharmacologic comfort measures Outcome: Progressing   Problem: Health Behavior/Discharge Planning: Goal: Ability to manage health-related needs will improve Outcome: Progressing   Problem: Clinical Measurements: Goal: Ability to maintain clinical measurements within normal limits will improve Outcome: Progressing Goal: Will remain free from infection Outcome: Progressing Goal: Diagnostic test results will improve Outcome: Progressing Goal: Respiratory complications will improve Outcome: Progressing Goal: Cardiovascular complication will be avoided Outcome: Progressing   Problem: Activity: Goal: Risk for activity intolerance will decrease Outcome: Progressing   Problem: Nutrition: Goal: Adequate nutrition will be maintained Outcome: Progressing   Problem: Coping: Goal: Level of anxiety will decrease Outcome: Progressing   Problem: Elimination: Goal: Will not experience complications related to bowel motility Outcome: Progressing Goal: Will not experience complications related to urinary retention Outcome: Progressing   Problem: Pain Managment: Goal: General experience of comfort will improve and/or be controlled Outcome: Progressing   Problem: Safety: Goal: Ability to remain free from injury will improve Outcome: Progressing   Problem: Skin Integrity: Goal: Risk for impaired  skin integrity will decrease Outcome: Progressing   Problem: Education: Goal: Knowledge of disease or condition will improve Outcome: Progressing Goal: Knowledge of the prescribed therapeutic regimen will improve Outcome: Progressing Goal: Individualized Educational Video(s) Outcome: Progressing   Problem: Clinical Measurements: Goal: Complications related to the disease process, condition or treatment will be avoided or minimized Outcome: Progressing   Problem: Education: Goal: Knowledge of condition will improve Outcome: Progressing Goal: Individualized Educational Video(s) Outcome: Progressing Goal: Individualized Newborn Educational Video(s) Outcome: Progressing   Problem: Activity: Goal: Will verbalize the importance of balancing activity with adequate rest periods Outcome: Progressing Goal: Ability to tolerate increased activity will improve Outcome: Progressing   Problem: Coping: Goal: Ability to identify and utilize available resources and services will improve Outcome: Progressing   Problem: Life Cycle: Goal: Chance of risk for complications during the postpartum period will decrease Outcome: Progressing   Problem: Role Relationship: Goal: Ability to demonstrate positive interaction with newborn will improve Outcome: Progressing   Problem: Skin Integrity: Goal: Demonstration of wound healing without infection will improve Outcome: Progressing

## 2024-03-24 NOTE — Progress Notes (Signed)
 Patient disclosed to this nurse that she was experiencing new onset of of tightness and pain in her legs, and that her legs looked more swollen than normal. This nurse accessed patient's legs and they were more swollen from her normal 1+ pitting edema to a 3+ pitting edema. Legs appeared to look very tight and shiny d/t tightness. When this nurse was feeling patient's leg for pain spots patient told this nurse that she had pain in both shins, across the tops of both feet, and in her calf. Patient has positive Homan's sign in bilateral calf's.Patient also endorsed having SOB with exertion and without exertion, this nurse noted patient breathing slightly faster and a little like she was grasping for breath while talking, patients spO2 was 98% and Respirations were 23 and bil. Lung sounds were clear. Provider Pickens MD made aware of this, and is placing orders for patient.

## 2024-03-24 NOTE — Progress Notes (Addendum)
 Will plan to increase back to home Labetalol 400mg  TID along with Lisinopril.  Plan to monitor BP overnight.  Myna Hidalgo, DO Attending Obstetrician & Gynecologist, [facility] for [employer], [facility]

## 2024-03-24 NOTE — Clinical Social Work Maternal (Signed)
 CLINICAL SOCIAL WORK MATERNAL/CHILD NOTE  Patient Details  Name: Paula Dominguez MRN: [id number] Date of Birth: [birth year]  Date:  03/24/2024  Clinical Social Worker Initiating Note:  Albertine Patricia, LCSWA Date/Time: Initiated:  03/24/24/1504     Child's Name:  Paula Dominguez   Biological Parents:  Mother, Father (FOB: Paula Dominguez, DOB: 01/04/1977)   Need for Interpreter:  None   Reason for Referral:  Behavioral Health Concerns, Other (Comment) (NICU admission)   Address:  [address] [place] [address]    Phone number:  [phone] (home)     Additional phone number:   Household Members/Support Persons (HM/SP):   Household Member/Support Person 1, Household Member/Support Person 2, Household Member/Support Person 3, Household Member/Support Person 4, Household Member/Support Person 5   HM/SP Name Relationship DOB or Age  HM/SP -1 Paula Dominguez FOB/Spouse 01/04/1977  HM/SP -2 Paula Dominguez Daughter 03/29/2006  HM/SP -3 Paula Dominguez Son 12/23/2010  HM/SP -4 Paula Dominguez 11/20/2013  HM/SP -5 Paula Dominguez Daughter 11/10/2018  HM/SP -6        HM/SP -7        HM/SP -8          Natural Supports (not living in the home):  Children, Immediate Family   Professional Supports: Organized support group (Comment), Therapist, Other (Comment) (Psychiatrist -Paula Jarvis, PA [employer], LCSW ([place] [employer]))   Employment: Full-time   Type of Work: "Tax adviser"   Education:  Engineer, maintenance (IT)   Homebound arranged:    Surveyor, quantity Resources:  [employer], Media planner    Other Resources:      Cultural/Religious Considerations Which May Impact Care:    Strengths:  Ability to meet basic needs  , Merchandiser, retail, Psychotropic Medications   Psychotropic Medications:  Klonopin      Pediatrician:    Armed forces operational officer area  Pediatrician List:   [facility]  [place]     [place]    [facility]      Pediatrician Fax Number:    Risk Factors/Current Problems:  Mental Health Concerns     Cognitive State:  Able to Concentrate  , Alert  , Linear Thinking  , Goal Oriented  , Insightful     Mood/Affect:  Calm  , Comfortable  , Relaxed  , Interested     CSW Assessment: CSW was consulted due to [place] Postnatal Depression Scale score of 19, history of anxiety and depression, and infant's NICU admission. CSW met with MOB at bedside to complete assessment. When CSW entered room, MOB was observed sitting in chair. FOB and MOB's son were present in room. CSW introduced self and requested to speak with MOB alone. FOB and MOB's son left room. CSW explained reason for consult. MOB presented as oriented x4, with a calm disposition, was agreeable to consult and remained engaged throughout encounter. MOB was forthcoming about recent mental health symptoms. MOB did not display any acute mental health symptoms during consult.  CSW confirmed demographic information in chart. MOB reports she has 8 living children including infant. MOB reports she lives with FOB and her four youngest living children (see chart above). MOB reports her three older children Paula Dominguez, Paula Dominguez, DOB: 10/22/2002, Paula Dominguez, F, DOB: 08/10/1998, & Paula Dominguez, M, DOB: 03/23/1995) live on their own. While sharing demographic information about her children, MOB openly shared about the loss of her daughter, Paula Dominguez (DOB: 07/08/2021) who  passed away at 48 months of age due to SIDS. CSW expressed condolences. MOB shared that she did not begin to grieve the loss of Paula Dominguez until she became pregnant, sharing that she had felt "numb" until becoming pregnant with infant and has since endorsed feelings of grief and sadness regarding her loss. MOB shared she has attended support groups which she has found healing.  CSW inquired how MOB is feeling  emotionally since infant's arrival and acknowledged MOB's Edinburgh score of 19. MOB shared that she has felt "yucky", explaining that she copes by staying busy and she feels she has been forced to slow down while inpatient, which has led to her feeling "depressed." MOB shared how she has been trying to focus on gratitude but has felt down emotionally. CSW acknowledged difficult emotions that can arise with a NICU admission. MOB reports that this is the first experience having an infant in the NICU. MOB shared how she feels that it has been hard for her support people to be around her due to her recent feelings of sadness and that she feels disconnected from FOB due to feeling that he has been closed off emotionally since infant's birth. CSW provided active listening and validated MOB's feelings. CSW emphasized the importance of MOB caring for herself during this time. CSW inquired about additional supports. MOB identified her mother and her children as supports.   CSW inquired about MOB's mental health history. MOB acknowledged a history of anxiety and depression, stating she was first diagnosed at age 43. CSW inquired about noted diagnosis of Bipolar Disorder in chart. MOB denied endorsing symptoms of mania in the past, reporting Bipolar Disorder as an incorrect diagnosis. MOB reports that her therapist recently told her that she may have PTSD due to symptoms she has experienced since the loss of her daughter. CSW inquired if MOB experienced postpartum depression after the birth of any of her other children. MOB denied a history of postpartum depression but states she feels she has had symptoms of postpartum depression since the birth of infant. CSW inquired about current mental health treatment. MOB reports she met with integrated behavioral health therapists during her pregnancy and recently established care with [facility] where she sees therapist Paula Ping, LCSW with an  upcoming appointment 04/18/24 and will establish psychiatric care with Paula Jarvis, NP on 03/27/24. MOB reports she has been attending grief support groups which she also finds as helpful. CSW inquired about mental health medication. MOB reports she is prescribed Klonopin PRN through her OBGYN, which she reports as helpful. MOB reports endorsing symptoms of anxiety during her pregnancy, marked by feeling panicky, not wanting to be around crowds, and fear of elevators. CSW inquired about additional coping skills. MOB identified talking positively to herself using affirmations, walking, praying, and thrifting as coping skills. CSW commended MOB for identification of healthy coping skills and encouraged MOB to remain connected to supports and utilize coping skills while coping with the stressors associated with infant's NICU admission and recovering postpartum. CSW assessed for safety. MOB denied current SI/HI/domestic violence.   CSW provided education regarding the baby blues period vs. perinatal mood disorders, discussed treatment and gave resources for mental health follow up if concerns arise.  CSW recommends self-evaluation during the postpartum time period using the New Mom Checklist from Postpartum Progress and encouraged MOB to contact a medical professional if symptoms are noted at any time.    MOB reports she has not yet started shopping for infant  but plans to purchase needed items once discharged. MOB states she would like to continue care with Dr. Crissie Reese or a [facility] practice for infant's follow up care.  CSW provided review of Sudden Infant Death Syndrome (SIDS) precautions.    CSW and MOB discussed infant's NICU admission. CSW informed MOB about the NICU, what to expect, and resources/supports available while infant is admitted to the NICU. MOB reported that she feels updated about infant's care. MOB denied any transportation barriers with visiting infant in the NICU. While discussing infant's NICU  admission, MOB shared how she and her family have had "horrible" experiences with security officers at the main security desk and staff at the nurse's station in the NICU when her family has presented to visit infant. CSW apologized for MOB's negative experiences. MOB expressed that she would like to make a formal complaint. CSW informed MOB that CSW will relay request to weekday NICU CSWs for follow up.   CSW inquired about WIC/EBT benefits. MOB reports she had an appointment scheduled to apply for benefits but missed it due to being admitted to the hospital. CSW provided MOB with contact information for [facility] and [facility] Department of Social Services for follow up. CSW inquired about additional resource needs, MOB denied additional needs at this time.   CSW will continue to offer support and resources to family while infant remains in NICU.  CSW Plan/Description:  No Further Intervention Required/No Barriers to Discharge, Sudden Infant Death Syndrome (SIDS) Education, Perinatal Mood and Anxiety Disorder (PMADs) Education, Other Information/Referral to [employer], LCSWA 03/24/2024, 3:34 PM

## 2024-03-24 NOTE — Lactation Note (Signed)
 This note was copied from a baby's chart.  NICU Lactation Consultation Note  Patient Name: Paula Dominguez Date: 03/24/2024 Age:44 hours  Reason for consult: Follow-up assessment; NICU baby; 1st time breastfeeding; Infant < 5lbs; Preterm <34wks; Other (Comment); Maternal discharge (cHTN, tobacco use, AMA)  SUBJECTIVE Visited with family of 14 36/66 weeks old AGA NICU female "Paula Dominguez"; Paula Dominguez reported she's only pumped once since last LC visit and that pumping triggered lots of cramping and it was very uncomfortable for her. She didn't sign the consent for donor milk, so now baby is on Similac 24 calorie formula. Paula Dominguez is expecting to be discharged from [facility] today. Reviewed discharge education, pump settings and the importance of consistent pumping for the onset of lactogenesis II and the prevention of engorgement.   OBJECTIVE Infant data: Mother's Current Feeding Choice: Breast Milk and Formula  O2 Device: Room Air FiO2 (%): 21 %  Infant feeding assessment IDFTS - Readiness: 3   Maternal data: [id number] Vaginal, Spontaneous Pumping frequency: 1 time/24 hours Pumped volume: 10 mL  WIC Program: Yes WIC Referral Sent?: Yes What county?: [place] Pump:  (Offered loaner pump on 03/24/2024)  ASSESSMENT Infant: Feeding Status: Scheduled 8-11-2-5 Feeding method: Tube/Gavage (Bolus)  Maternal: Milk volume: Normal  INTERVENTIONS/PLAN Interventions: Interventions: Breast feeding basics reviewed; Coconut oil; DEBP; Education Discharge Education: Engorgement and breast care  Plan: STS once able to Take all pump parts to baby's room after her discharge Pump both breasts on initiate mode every 3 hours for 15 minutes, ideally 8 pumping sessions/24 hours; swtich to maintain mode once expressing +20 ml of EBM combined  Visitors present. All questions and concerns answered, family to contact [facility] services PRN.  Consult Status: NICU follow-up NICU Follow-up type:  Verify DEBP issuance; Verify absence of engorgement; Verify onset of copious milk   Fia Hebert S Krissia Schreier 03/24/2024, 2:51 PM

## 2024-03-25 ENCOUNTER — Other Ambulatory Visit (HOSPITAL_COMMUNITY): Payer: Self-pay

## 2024-03-25 ENCOUNTER — Inpatient Hospital Stay (HOSPITAL_COMMUNITY)

## 2024-03-25 DIAGNOSIS — R609 Edema, unspecified: Secondary | ICD-10-CM | POA: Diagnosis not present

## 2024-03-25 LAB — COMPREHENSIVE METABOLIC PANEL WITH GFR
ALT: 17 U/L (ref 0–44)
AST: 23 U/L (ref 15–41)
Albumin: 2.8 g/dL — ABNORMAL LOW (ref 3.5–5.0)
Alkaline Phosphatase: 56 U/L (ref 38–126)
Anion gap: 14 (ref 5–15)
BUN: 15 mg/dL (ref 6–20)
CO2: 18 mmol/L — ABNORMAL LOW (ref 22–32)
Calcium: 9 mg/dL (ref 8.9–10.3)
Chloride: 107 mmol/L (ref 98–111)
Creatinine, Ser: 0.76 mg/dL (ref 0.44–1.00)
GFR, Estimated: >60 mL/min (ref >60)
Glucose, Bld: 115 mg/dL — ABNORMAL HIGH (ref 70–99)
Potassium: 3.6 mmol/L (ref 3.5–5.1)
Sodium: 139 mmol/L (ref 135–145)
Total Bilirubin: 0.3 mg/dL (ref 0.0–1.2)
Total Protein: 5.8 g/dL — ABNORMAL LOW (ref 6.5–8.1)

## 2024-03-25 LAB — BRAIN NATRIURETIC PEPTIDE: B Natriuretic Peptide: 28.5 pg/mL (ref 0.0–100.0)

## 2024-03-25 LAB — CBC
HCT: 28.1 % — ABNORMAL LOW (ref 36.0–46.0)
Hemoglobin: 9.4 g/dL — ABNORMAL LOW (ref 12.0–15.0)
MCH: 32.8 pg (ref 26.0–34.0)
MCHC: 33.5 g/dL (ref 30.0–36.0)
MCV: 97.9 fL (ref 80.0–100.0)
Platelets: 223 10*3/uL (ref 150–400)
RBC: 2.87 MIL/uL — ABNORMAL LOW (ref 3.87–5.11)
RDW: 13.9 % (ref 11.5–15.5)
WBC: 15 10*3/uL — ABNORMAL HIGH (ref 4.0–10.5)
nRBC: 0.3 % — ABNORMAL HIGH (ref 0.0–0.2)

## 2024-03-25 LAB — D-DIMER, QUANTITATIVE: D-Dimer, Quant: 0.62 ug{FEU}/mL — ABNORMAL HIGH (ref 0.00–0.50)

## 2024-03-25 MED ORDER — ACETAMINOPHEN 325 MG PO TABS: 650.0000 mg | ORAL_TABLET | ORAL | Status: AC | PRN

## 2024-03-25 MED ORDER — POTASSIUM CHLORIDE CRYS ER 20 MEQ PO TBCR
20.0000 meq | EXTENDED_RELEASE_TABLET | Freq: Two times a day (BID) | ORAL | 0 refills | Status: DC
  Filled 2024-03-25: qty 10, 5d supply, fill #0

## 2024-03-25 MED ORDER — NICOTINE 14 MG/24HR TD PT24: 14.0000 mg | MEDICATED_PATCH | Freq: Every day | TRANSDERMAL | 1 refills | Status: AC

## 2024-03-25 MED ORDER — OXYCODONE HCL 5 MG PO TABS
5.0000 mg | ORAL_TABLET | Freq: Four times a day (QID) | ORAL | 0 refills | Status: DC | PRN
  Filled 2024-03-25: qty 20, 5d supply, fill #0

## 2024-03-25 MED ORDER — IBUPROFEN 600 MG PO TABS
600.0000 mg | ORAL_TABLET | Freq: Four times a day (QID) | ORAL | 0 refills | Status: DC
  Filled 2024-03-25: qty 30, 8d supply, fill #0

## 2024-03-25 MED ORDER — LISINOPRIL 10 MG PO TABS
10.0000 mg | ORAL_TABLET | Freq: Every day | ORAL | 11 refills | Status: DC
  Filled 2024-03-25: qty 30, 30d supply, fill #0

## 2024-03-25 MED ORDER — LABETALOL HCL 200 MG PO TABS
400.0000 mg | ORAL_TABLET | Freq: Three times a day (TID) | ORAL | 2 refills | Status: DC
  Filled 2024-03-25 (×2): qty 180, 30d supply, fill #0

## 2024-03-25 MED ORDER — CLONAZEPAM 0.5 MG PO TABS
0.5000 mg | ORAL_TABLET | Freq: Three times a day (TID) | ORAL | 0 refills | Status: DC | PRN
  Filled 2024-03-25: qty 8, 3d supply, fill #0

## 2024-03-25 MED ORDER — FUROSEMIDE 20 MG PO TABS
20.0000 mg | ORAL_TABLET | Freq: Every day | ORAL | 0 refills | Status: DC
  Filled 2024-03-25: qty 5, 5d supply, fill #0

## 2024-03-25 NOTE — Plan of Care (Signed)
  Problem: Education: Goal: Knowledge of disease or condition will improve Outcome: Progressing Goal: Knowledge of the prescribed therapeutic regimen will improve Outcome: Progressing   Problem: Fluid Volume: Goal: Peripheral tissue perfusion will improve Outcome: Progressing   Problem: Clinical Measurements: Goal: Complications related to disease process, condition or treatment will be avoided or minimized Outcome: Progressing   Problem: Education: Goal: Knowledge of General Education information will improve Description: Including pain rating scale, medication(s)/side effects and non-pharmacologic comfort measures Outcome: Progressing   Problem: Health Behavior/Discharge Planning: Goal: Ability to manage health-related needs will improve Outcome: Progressing   Problem: Clinical Measurements: Goal: Ability to maintain clinical measurements within normal limits will improve Outcome: Progressing Goal: Will remain free from infection Outcome: Progressing Goal: Diagnostic test results will improve Outcome: Progressing Goal: Respiratory complications will improve Outcome: Progressing Goal: Cardiovascular complication will be avoided Outcome: Progressing   Problem: Activity: Goal: Risk for activity intolerance will decrease Outcome: Progressing   Problem: Nutrition: Goal: Adequate nutrition will be maintained Outcome: Progressing   Problem: Coping: Goal: Level of anxiety will decrease Outcome: Progressing   Problem: Elimination: Goal: Will not experience complications related to bowel motility Outcome: Progressing Goal: Will not experience complications related to urinary retention Outcome: Progressing   Problem: Pain Managment: Goal: General experience of comfort will improve and/or be controlled Outcome: Progressing   Problem: Safety: Goal: Ability to remain free from injury will improve Outcome: Progressing   Problem: Skin Integrity: Goal: Risk for impaired  skin integrity will decrease Outcome: Progressing   Problem: Education: Goal: Knowledge of disease or condition will improve Outcome: Progressing Goal: Knowledge of the prescribed therapeutic regimen will improve Outcome: Progressing Goal: Individualized Educational Video(s) Outcome: Progressing   Problem: Clinical Measurements: Goal: Complications related to the disease process, condition or treatment will be avoided or minimized Outcome: Progressing   Problem: Education: Goal: Knowledge of condition will improve Outcome: Progressing Goal: Individualized Educational Video(s) Outcome: Progressing Goal: Individualized Newborn Educational Video(s) Outcome: Progressing   Problem: Activity: Goal: Will verbalize the importance of balancing activity with adequate rest periods Outcome: Progressing Goal: Ability to tolerate increased activity will improve Outcome: Progressing   Problem: Coping: Goal: Ability to identify and utilize available resources and services will improve Outcome: Progressing   Problem: Life Cycle: Goal: Chance of risk for complications during the postpartum period will decrease Outcome: Progressing   Problem: Role Relationship: Goal: Ability to demonstrate positive interaction with newborn will improve Outcome: Progressing   Problem: Skin Integrity: Goal: Demonstration of wound healing without infection will improve Outcome: Progressing

## 2024-03-25 NOTE — Progress Notes (Signed)
Discharge instructions and prescriptions given to pt. Discussed post vaginal delivery care, signs and symptoms to report to the MD, upcoming appointments, and meds.Pt verbalizes understanding and has no questions or concerns at this time. Pt discharged home from hospital in stable condition. 

## 2024-03-25 NOTE — Progress Notes (Signed)
 BLE venous duplex has been completed.   Results can be found under chart review under CV PROC. 03/25/2024 3:56 PM Auron Tadros RVT, RDMS

## 2024-03-25 NOTE — Lactation Note (Signed)
 This note was copied from a baby's chart.  NICU Lactation Consultation Note  Patient Name: Paula Dominguez [id number] Date: 03/25/2024 Age:44 days  Reason for consult: Follow-up assessment; NICU baby; Preterm <34wks; Infant < 5lbs; Other (Comment) (cHTN, severe anxiety and depression, [facility] clinic for Benzo dependence)  SUBJECTIVE  LC in to visit with P9 Mom of preterm baby "Paula Dominguez" in the NICU.   LC noted pump parts soaking in cold water by the sink in the basin.  Mom in restroom.  LC took the pump parts, washed them and rinsed and placed in separate bin with clean paper towels to dry.   LC labeled the bins.   MD and RN in room to discuss Mom's discharge from [facility].  Mom appeared upset and the conversation was very involved and lengthy.  LC took the pump parts upstairs to baby's room.   LC asked Mom about a [facility] loaner pump and Mom wasn't sure.  Mom has heard from [facility], but doesn't have a date for picking up a pump.  LC spoke with RN taking care of baby and asked if she would demonstrate the hand pump if Mom is planning to go home without a pump.  OBJECTIVE Infant data: Mother's Current Feeding Choice: Breast Milk and Formula  O2 Device: Room Air  Infant feeding assessment IDFTS - Readiness: 2   Maternal data: [id number] Vaginal, Spontaneous Pumping frequency: unsure Pumped volume: 10 mL Flange Size: 21 Hands-free pumping top sizes: Large [name]  WIC Program: Yes WIC Referral Sent?: Yes What county?: [place] Pump:  (Offered a [employer] [facility] loaner prior to DC on 3/31)  ASSESSMENT Infant:  Feeding Status: Scheduled 8-11-2-5 Feeding method: Tube/Gavage (Bolus)  Maternal: Milk volume: Normal  INTERVENTIONS/PLAN Interventions: Interventions: Breast feeding basics reviewed; Coconut oil; DEBP; Education Discharge Education: Engorgement and breast care Tools: Pump; Flanges  Plan: Consult Status: NICU follow-up NICU Follow-up type: Verify onset of copious milk; Verify absence of  engorgement; Verify DEBP issuance   Judee Clara 03/25/2024, 4:53 PM

## 2024-03-26 ENCOUNTER — Ambulatory Visit: Payer: Medicaid Other

## 2024-03-26 ENCOUNTER — Encounter: Admitting: Family Medicine

## 2024-03-26 ENCOUNTER — Encounter: Payer: Self-pay | Admitting: Obstetrics & Gynecology

## 2024-03-26 LAB — SURGICAL PATHOLOGY

## 2024-03-27 ENCOUNTER — Encounter (HOSPITAL_COMMUNITY): Payer: Self-pay | Admitting: Physician Assistant

## 2024-03-27 ENCOUNTER — Ambulatory Visit (HOSPITAL_COMMUNITY): Payer: Self-pay

## 2024-03-27 ENCOUNTER — Ambulatory Visit (INDEPENDENT_AMBULATORY_CARE_PROVIDER_SITE_OTHER): Admitting: Physician Assistant

## 2024-03-27 VITALS — BP 178/111 | HR 73 | Temp 98.2°F | Ht 67.0 in | Wt 236.6 lb

## 2024-03-27 DIAGNOSIS — F411 Generalized anxiety disorder: Secondary | ICD-10-CM | POA: Diagnosis not present

## 2024-03-27 DIAGNOSIS — F5105 Insomnia due to other mental disorder: Secondary | ICD-10-CM

## 2024-03-27 DIAGNOSIS — F331 Major depressive disorder, recurrent, moderate: Secondary | ICD-10-CM | POA: Diagnosis not present

## 2024-03-27 DIAGNOSIS — F410 Panic disorder [episodic paroxysmal anxiety] without agoraphobia: Secondary | ICD-10-CM | POA: Diagnosis not present

## 2024-03-27 DIAGNOSIS — F99 Mental disorder, not otherwise specified: Secondary | ICD-10-CM | POA: Insufficient documentation

## 2024-03-27 MED ORDER — SERTRALINE HCL 25 MG PO TABS: 25.0000 mg | ORAL_TABLET | Freq: Every day | ORAL | 1 refills | Status: DC

## 2024-03-27 MED ORDER — TRAZODONE HCL 50 MG PO TABS: 50.0000 mg | ORAL_TABLET | Freq: Every day | ORAL | 1 refills | Status: DC

## 2024-03-27 NOTE — Lactation Note (Signed)
 This note was copied from a baby's chart.  NICU Lactation Consultation Note  Patient Name: Girl Anju Sereno [id number] Date: 03/27/2024 Age:43 years  Reason for consult: Follow-up assessment; NICU baby; 1st time breastfeeding; Infant < 5lbs; Preterm <34wks; Other (Comment) (cHTN, tobacco use, AMA)  SUBJECTIVE LC in the room to visit with family but Ms. Luff has not come in for the day yet. She called NICU RN Selinda Flavin on Vocera and reported she's no longer pumping; she has decided to stay with formula, baby "[name]" is currently on Similac 24 calorie formula. Lactation services are completed at this time.  OBJECTIVE Infant data: Mother's Current Feeding Choice: Formula  O2 Device: Room Air  Infant feeding assessment IDFTS - Readiness: 4   Maternal data: [id number] Vaginal, Spontaneous No data recorded WIC Program: Yes WIC Referral Sent?: Yes What county?: [place] Pump:  (Offered a [facility] loaner prior to DC on 3/31)  ASSESSMENT Infant: Feeding Status: Scheduled 8-11-2-5 Feeding method: Tube/Gavage (Bolus)  Maternal: Not pumping  INTERVENTIONS/PLAN Interventions: Interventions: Education  Plan: Consult Status: Complete   Masud Holub S Atiya Yera 03/27/2024, 5:32 PM

## 2024-03-27 NOTE — Progress Notes (Signed)
 Psychiatric Initial Adult Assessment   Patient Identification: Paula Dominguez MRN:  [id number] Date of Evaluation:  03/27/2024 Referral Source: Not applicable Chief Complaint:   Chief Complaint  Patient presents with   Establish Care   Medication Management   Visit Diagnosis:    ICD-10-CM   1. Major depressive disorder, recurrent episode, moderate with anxious distress (HCC)  F33.1 sertraline (ZOLOFT) 25 MG tablet    2. Generalized anxiety disorder  F41.1 sertraline (ZOLOFT) 25 MG tablet    3. Panic disorder  F41.0 sertraline (ZOLOFT) 25 MG tablet    4. Insomnia due to other mental disorder  F51.05 traZODone (DESYREL) 50 MG tablet   F99       History of Present Illness:    Paula Dominguez is a 43 year old female with a past psychiatric history significant for panic attacks and anxiety who presents to [facility] to establish psychiatric care and for medication management.  Patient reports that she was referred by her OB/GYN for psychiatric care.  She reports that she has a history of anxiety and panic attacks.  As of late, patient reports that her panic attacks have been worsening since recently having her baby.  Patient currently works as a Data processing manager and states that she gets easily overwhelmed in front of clients.  Patient states that she was also fearful that her baby would be lost during the pregnancy.  She also informed provider that she had concerns over her health and wellbeing during the pregnancy.  Patient reports that she has had a total of 11 children; however, her 2 children have died in the past.  She reports that one of the children that past 41 months old and died in her arms.  Patient reports that she is currently working from home due to being fearful of losing her child.  She reports that her most recent pregnancy with a high-risk pregnancy and she had to be induced.  Patient is still recovering since giving birth.   Patient reports that she is currently taking clonazepam 0.5 mg 3 times daily as needed prescribed to her by her OB/GYN.  She reports that the medication has been helpful but she is still unable to calm down.  Patient reports that she has been on the following psychiatric medications in the past: Ativan, Paxil, and the medication for concentration (patient does not remember the name).  Patient endorses anxiety and rates her anxiety at 10 out of 10.  Patient notes that she also has PTSD due to losing 2 of her children and past trauma.  Triggers to her anxiety include things not going well and fear of something happening to her children.  Patient reports that she tries to alleviate her anxiety through medication management and medication.  Patient reports that she sought help because her symptoms are starting to become more noticeable by the people around her.  She reports that she experiences panic attacks roughly 3 times per week.  Patient denies any discernible triggers to her panic attacks.  Patient's panic attacks are characterized by the following symptoms: crying spells, shaking, chest pain, shortness of breath, and elevated heart rate.  In addition to her anxiety and panic attacks, patient endorses depression.  Patient is unsure of how long she has been dealing with depression for.  Patient endorses depression attributed to her anxiety.  She reports that her depression makes her ruminate on why she has anxiety.  Patient rates her depression at 10 out of 10  with 10 being most severe.  Patient endorses depressive episodes 3 days out of the week.  Patient endorses the following depressive symptoms: low mood, anhedonia, fatigue, disturbed sleep, difficulty concentrating, decreased appetite, and weight gain.  Patient endorses a past history of hospitalization due to mental health.  Patient reports that she was hospitalized when she was 43 years of age due to suicide attempt where she tried to ingest Clorox.   Patient denies a past history of homicide attempt.  A PHQ-9 screen was performed with the patient scoring a 17.  A GAD-7 screen was also performed with the patient scoring a 15.  Patient is alert and oriented x 4, calm, cooperative, and fully engaged in conversation during the encounter.  Patient endorses neutral mood.  Patient exhibits depressed mood with congruent affect.  Patient is tearful at times while providing history.  Patient denies suicidal or homicidal ideations.  She further denies auditory or visual hallucinations and does not appear to be responding to internal/external stimuli.  Patient denies paranoia or delusional thoughts.  Patient endorses poor sleep and receives anywhere between 2 to 5 hours of sleep per night.  Patient endorses decreased appetite and has at least 1 meal per day.  Patient denies alcohol consumption or illicit drug use.  Patient denies tobacco use but does engage in vaping.  Associated Signs/Symptoms: Depression Symptoms:  depressed mood, anhedonia, insomnia, psychomotor agitation, fatigue, feelings of worthlessness/guilt, difficulty concentrating, impaired memory, recurrent thoughts of death, anxiety, panic attacks, loss of energy/fatigue, disturbed sleep, weight gain, decreased appetite, (Hypo) Manic Symptoms:  Distractibility, Elevated Mood, Flight of Ideas, Grandiosity, Irritable Mood, Labiality of Mood, Anxiety Symptoms:  Agoraphobia, Excessive Worry, Panic Symptoms, Obsessive Compulsive Symptoms:   particular about surroundings, Social Anxiety, Psychotic Symptoms:   Patient denies PTSD Symptoms: Had a traumatic exposure:  Patient reports that she has lost 2 of her children in the past. Patient lost one of her children in her arms. Patient believes that she has fear of abandonment. Patient believes that she has developed a codependency with her husband. Patient has a strong fear of being lied to. Had a traumatic exposure in the last month:   N/A Re-experiencing:  Flashbacks Intrusive Thoughts Nightmares Hypervigilance:  Yes Hyperarousal:  Difficulty Concentrating Emotional Numbness/Detachment Increased Startle Response Irritability/Anger Sleep Avoidance:  Decreased Interest/Participation Foreshortened Future  Past Psychiatric History:  Patient has a past psychiatric history significant for depression, anxiety, and panic attacks.  Patient endorses a past history of hospitalization due to mental health stating that she was hospitalized at the age of 23.  Patient reports that she was hospitalized due to ingesting Clorox in a suicide attempt.  Patient endorses a past history of suicide attempt.  Patient denies a past history of homicide attempt.  Previous Psychotropic Medications: Yes , patient reports that she has been on Ativan, Paxil, and a medication for help with her concentration.  Patient is currently taking clonazepam 0.5 mg 2 times daily as needed.  Substance Abuse History in the last 12 months:  No.  Consequences of Substance Abuse: Negative  Past Medical History:  Past Medical History:  Diagnosis Date   Anemia    Anxiety and depression    Blood transfusion without reported diagnosis    2014, 2018   GERD (gastroesophageal reflux disease)    History of preterm delivery, currently pregnant 01/30/2024   Hx of preeclampsia, prior pregnancy, currently pregnant    prior pregnancy   Hypertension     Past Surgical History:  Procedure Laterality  Date   HEMORRHOID SURGERY      Family Psychiatric History:  Patient denies a family history of psychiatric illness  Family history of suicide attempt: Patient denies Family history of homicide attempt: Patient denies Family history of substance abuse: Patient denies  Family History:  Family History  Problem Relation Age of Onset   Cancer Mother    Healthy Mother    Heart disease Father    Healthy Father    Asthma Daughter    Asthma Son    Hypertension  Maternal Grandmother    Diabetes Maternal Grandmother    Cancer Maternal Grandmother    Cancer Maternal Grandfather    Cancer Paternal Grandmother    Cancer Paternal Grandfather    Hypertension Other    Cancer Other     Social History:   Social History   Socioeconomic History   Marital status: Married    Spouse name: Paula Dominguez   Number of children: 5   Years of education: Not on file   Highest education level: Not on file  Occupational History   Occupation: CERTIFIED NURSING ASSISTANT    Employer: [employer]  Tobacco Use   Smoking status: Former    Current packs/day: 0.00    Average packs/day: 0.3 packs/day for 17.0 years (4.3 ttl pk-yrs)    Types: Cigarettes    Start date: 07/27/1996    Quit date: 07/27/2013    Years since quitting: 10.6   Smokeless tobacco: Never   Tobacco comments:    3 cigs a day  Vaping Use   Vaping status: Former   Quit date: 10/14/2023   Substances: Nicotine, Flavoring  Substance and Sexual Activity   Alcohol use: No   Drug use: No   Sexual activity: Yes    Partners: Male  Other Topics Concern   Not on file  Social History Narrative   Not on file   Social Drivers of Health   Financial Resource Strain: Not on file  Food Insecurity: No Food Insecurity (03/19/2024)   Hunger Vital Sign    Worried About Running Out of Food in the Last Year: Never true    Ran Out of Food in the Last Year: Never true  Transportation Needs: No Transportation Needs (03/19/2024)   PRAPARE - Administrator, Civil Service (Medical): No    Lack of Transportation (Non-Medical): No  Physical Activity: Not on file  Stress: Not on file  Social Connections: Not on file    Additional Social History:  Patient endorses social support.  Patient endorses having children of her own.  Patient endorses housing.  Patient is currently employed.  Patient denies a past history of military experience.  Patient denies a past history of prison/jail time.   Patient has earned her bachelor's degree.  Patient denies having access to weapons but is currently registered.  Allergies:  No Known Allergies  Metabolic Disorder Labs: Lab Results  Component Value Date   HGBA1C 5.5 11/14/2023   No results found for: "PROLACTIN" No results found for: "CHOL", "TRIG", "HDL", "CHOLHDL", "VLDL", "LDLCALC" No results found for: "TSH"  Therapeutic Level Labs: No results found for: "LITHIUM" No results found for: "CBMZ" No results found for: "VALPROATE"  Current Medications: Current Outpatient Medications  Medication Sig Dispense Refill   sertraline (ZOLOFT) 25 MG tablet Take 1 tablet (25 mg total) by mouth daily. 30 tablet 1   traZODone (DESYREL) 50 MG tablet Take 1 tablet (50 mg total) by mouth at bedtime. 30 tablet  1   acetaminophen (TYLENOL) 325 MG tablet Take 2 tablets (650 mg total) by mouth every 4 (four) hours as needed (for pain scale < 4).     acetaminophen-caffeine (EXCEDRIN TENSION HEADACHE) 500-65 MG TABS per tablet Take 2 tablets by mouth every 6 (six) hours as needed (headache, aches and pains, fever). 60 tablet 0   clonazePAM (KLONOPIN) 0.5 MG tablet Take 1 tablet (0.5 mg total) by mouth 2 (two) times daily as needed for anxiety. 30 tablet 3   clonazePAM (KLONOPIN) 0.5 MG tablet Take 1 tablet (0.5 mg total) by mouth 3 (three) times daily as needed (anxiety). 8 tablet 0   cyclobenzaprine (FLEXERIL) 10 MG tablet Take 1 tablet (10 mg total) by mouth 2 (two) times daily as needed for muscle spasms. 20 tablet 2   ferrous sulfate 325 (65 FE) MG tablet Take 1 tablet (325 mg total) by mouth every other day. 15 tablet 0   furosemide (LASIX) 20 MG tablet Take 1 tablet (20 mg total) by mouth daily for 5 days. 5 tablet 0   hydrOXYzine (ATARAX) 10 MG tablet Take 1 tablet (10 mg total) by mouth 3 (three) times daily as needed for anxiety. 30 tablet 1   ibuprofen (ADVIL) 600 MG tablet Take 1 tablet (600 mg total) by mouth every 6 (six) hours. 30 tablet 0    labetalol (NORMODYNE) 200 MG tablet Take 2 tablets (400 mg total) by mouth 3 (three) times daily. 180 tablet 2   lisinopril (ZESTRIL) 10 MG tablet Take 1 tablet (10 mg total) by mouth daily. 30 tablet 11   magnesium oxide (MAG-OX) 400 MG tablet Take 1 tablet (400 mg total) by mouth daily. 90 tablet 0   Misc. Devices (WRIST BRACE) MISC 2 Devices by Does not apply route as needed. (Patient not taking: Reported on 03/12/2024) 2 each 0   Misc. Devices MISC 1 Device by Does not apply route as needed. Ergonomic keyboard for carpal tunnel syndrome 1 each 0   nicotine (NICODERM CQ - DOSED IN MG/24 HOURS) 14 mg/24hr patch Place 1 patch (14 mg total) onto the skin daily. 28 patch 1   nicotine polacrilex (COMMIT) 4 MG lozenge Take 1 lozenge (4 mg total) by mouth as needed for smoking cessation. (Patient not taking: Reported on 03/12/2024) 100 tablet 0   omeprazole (PRILOSEC) 40 MG capsule Take 1 capsule (40 mg total) by mouth in the morning and at bedtime. 60 capsule 5   oxyCODONE (ROXICODONE) 5 MG immediate release tablet Take 1 tablet (5 mg total) by mouth every 6 (six) hours as needed for severe pain (pain score 7-10). 20 tablet 0   polyethylene glycol powder (GLYCOLAX/MIRALAX) 17 GM/SCOOP powder Take 17 g by mouth daily. 510 g 2   potassium chloride SA (KLOR-CON M) 20 MEQ tablet Take 1 tablet (20 mEq total) by mouth 2 (two) times daily. 10 tablet 0   Prenatal Vit-Fe Fumarate-FA (PNV PRENATAL PLUS MULTIVITAMIN) 27-1 MG TABS Take 1 capsule by mouth daily. 90 tablet 6   No current facility-administered medications for this visit.    Musculoskeletal: Strength & Muscle Tone: within normal limits Gait & Station: normal Patient leans: N/A  Psychiatric Specialty Exam: Review of Systems  Psychiatric/Behavioral:  Positive for dysphoric mood and sleep disturbance. Negative for hallucinations, self-injury and suicidal ideas. The patient is nervous/anxious. The patient is not hyperactive.     Blood pressure  (!) 178/111, pulse 73, temperature 98.2 F (36.8 C), temperature source Oral, height 5\' 7"  (1.702 m),  weight 236 lb 9.6 oz (107.3 kg), last menstrual period 07/16/2023, SpO2 100%, unknown if currently breastfeeding.Body mass index is 37.06 kg/m.  General Appearance: Casual  Eye Contact:  Good  Speech:  Clear and Coherent and Normal Rate  Volume:  Normal  Mood:  Anxious and Depressed  Affect:  Congruent and Tearful  Thought Process:  Coherent, Goal Directed, and Descriptions of Associations: Intact  Orientation:  Full (Time, Place, and Person)  Thought Content:  WDL  Suicidal Thoughts:  No  Homicidal Thoughts:  No  Memory:  Immediate;   Good Recent;   Good Remote;   Good  Judgement:  Good  Insight:  Good  Psychomotor Activity:  Normal  Concentration:  Concentration: Good and Attention Span: Good  Recall:  Good  Fund of Knowledge:Good  Language: Good  Akathisia:  No  Handed:  Right  AIMS (if indicated):  not done  Assets:  Communication Skills Desire for Improvement Housing Resilience Social Support Transportation Vocational/Educational  ADL's:  Intact  Cognition: WNL  Sleep:  Poor   Screenings: GAD-7    Loss adjuster, chartered Office Visit from 03/27/2024 in [facility] Counselor from 03/01/2024 in [facility] Clinical Support from 10/06/2023 in [facility] for [facility] Healthcare at [facility] Routine Prenatal from 05/13/2021 in [facility] for [facility] Healthcare at [place]  Total GAD-7 Score 15 20 21 19       PHQ2-9    Flowsheet Row Office Visit from 03/27/2024 in [facility] Counselor from 03/01/2024 in [facility] Clinical Support from 10/06/2023 in [facility] for [facility] Healthcare at [facility] Routine Prenatal from 05/13/2021 in [facility] for [facility] at [facility] Total Score 4 6 6 3   PHQ-9 Total Score 17 22 19 20       Flowsheet  Row Office Visit from 03/27/2024 in [facility] Admission (Discharged) from 03/19/2024 in [facility] Admission (Discharged) from 03/14/2024 in [facility]  C-SSRS RISK CATEGORY Moderate Risk No Risk No Risk       Assessment and Plan:   Ellaree Gear. Kreitz is a 43 year old female with a past psychiatric history significant for panic attacks and anxiety who presents to [facility] to establish psychiatric care and for medication management.  Patient presents to the encounter requesting a day in the management of her ongoing anxiety, worsening depression, and panic attacks.  Patient was referred to this practice by her OB/GYN.  Patient is currently being managed with clonazepam 0.5 mg 3 times daily as needed (recently prescribed to her by her OB/GYN).  Per chart review, patient was most recently prescribed 8 tablets of clonazepam 0.5 mg 3 times daily as needed.  Patient to remain taking clonazepam 0.5 mg 3 times daily if needed for the management of her anxiety and panic attack.  Patient endorses worsening depression, anxiety, and panic attacks attributed to past trauma.  Patient recently gave birth and is worried she believes her child since she has a past history of one of her children passing away at 66 months old.  Due to her excessive worrying, patient reports that she works from home so that she can be closer to her child.  Due to her excessive worrying, patient has been plagued with worsening panic attacks, elevated anxiety and depressive symptoms characterized by issues with sleep, low mood, decreased energy, decreased concentration, and anhedonia.  Patient  is currently not on any depressive medications at this time.  Provider recommended patient be placed on sertraline 50 mg daily for the management of her depressive symptoms, anxiety, and panic attacks.  Provider also recommended patient be placed on  trazodone 50 mg at bedtime for the management of her sleep.  Patient was agreeable to recommendations.  Patient's medications to be e-prescribed to pharmacy of choice.  Prior to the conclusion of the encounter, provider went over medication side effect profile with the patient.  Patient vocalized understanding.  Collaboration of Care: Medication Management AEB provider managing patient's psychiatric medications, Psychiatrist AEB patient being followed by mental health provider at this facility, Other provider involved in patient's care AEB patient being seen by OB/GYN, and Referral or follow-up with counselor/therapist AEB patient being seen by a licensed medical social worker at this facility  Patient/Guardian was advised Release of Information must be obtained prior to any record release in order to collaborate their care with an outside provider. Patient/Guardian was advised if they have not already done so to contact the registration department to sign all necessary forms in order for [place] to release information regarding their care.   Consent: Patient/Guardian gives verbal consent for treatment and assignment of benefits for services provided during this visit. Patient/Guardian expressed understanding and agreed to proceed.   1. Major depressive disorder, recurrent episode, moderate with anxious distress (HCC) (Primary)  - sertraline (ZOLOFT) 25 MG tablet; Take 1 tablet (25 mg total) by mouth daily.  Dispense: 30 tablet; Refill: 1  2. Generalized anxiety disorder  - sertraline (ZOLOFT) 25 MG tablet; Take 1 tablet (25 mg total) by mouth daily.  Dispense: 30 tablet; Refill: 1  3. Panic disorder  - sertraline (ZOLOFT) 25 MG tablet; Take 1 tablet (25 mg total) by mouth daily.  Dispense: 30 tablet; Refill: 1  4. Insomnia due to other mental disorder  - traZODone (DESYREL) 50 MG tablet; Take 1 tablet (50 mg total) by mouth at bedtime.  Dispense: 30 tablet; Refill: 1  Patient to follow up in 6  weeks Provider spent a total of 60+ minutes with the patient/reviewing patient's chart  Meta Hatchet, PA 4/2/20252:31 PM

## 2024-03-28 ENCOUNTER — Telehealth: Payer: Self-pay

## 2024-03-28 NOTE — Telephone Encounter (Signed)
 Paula Dominguez nurse) left voicemail on nurse line requesting a call. Returned call. Paula Dominguez spoke with Paula Dominguez this afternoon about 1 hour ago. Da told her that her BP was elevated in office yesterday. Chart review shows BP elevated x 2 at behavioral health (178/111 and 187/110).  Has chronic hypertension with superimposed pre-e. Called pt; VM left stating I am trying to reach her about BP. MyChart message sent recommending patient go immediately to Dominguez.

## 2024-04-02 ENCOUNTER — Ambulatory Visit: Payer: Medicaid Other

## 2024-04-03 ENCOUNTER — Other Ambulatory Visit: Payer: Self-pay | Admitting: *Deleted

## 2024-04-03 ENCOUNTER — Encounter: Payer: Self-pay | Admitting: *Deleted

## 2024-04-03 ENCOUNTER — Telehealth (HOSPITAL_COMMUNITY): Payer: Self-pay

## 2024-04-03 DIAGNOSIS — G8918 Other acute postprocedural pain: Secondary | ICD-10-CM

## 2024-04-03 MED ORDER — OXYCODONE HCL 5 MG PO TABS: 5.0000 mg | ORAL_TABLET | Freq: Four times a day (QID) | ORAL | 0 refills | Status: DC | PRN

## 2024-04-03 MED ORDER — IBUPROFEN 600 MG PO TABS: 600.0000 mg | ORAL_TABLET | Freq: Four times a day (QID) | ORAL | 0 refills | Status: AC

## 2024-04-03 NOTE — Telephone Encounter (Signed)
 04/03/2024 1135  Name: Paula Dominguez MRN: [id number] DOB: [birth year]  Reason for Call:  Transition of Care Hospital Discharge Call  Contact Status: Patient Contact Status: Message  Language assistant needed:          Follow-Up Questions:    Inocente Salles Postnatal Depression Scale:  In the Past 7 Days:    PHQ2-9 Depression Scale:     Discharge Follow-up:    Post-discharge interventions: NA  Signature  Signe Colt

## 2024-04-03 NOTE — Addendum Note (Signed)
 Addended by: Merian Capron on: 04/03/2024 11:16 AM   Modules accepted: Orders

## 2024-04-08 ENCOUNTER — Telehealth (HOSPITAL_COMMUNITY): Payer: Self-pay

## 2024-04-08 NOTE — Telephone Encounter (Signed)
 Hello Pt states that over the weekend she ran out of clonazePAM (KLONOPIN) 0.5 MG tablets  not sure if I should have sent this to her OB GYN/ PCP or you.    JNL

## 2024-04-09 ENCOUNTER — Ambulatory Visit: Payer: Medicaid Other

## 2024-04-09 ENCOUNTER — Other Ambulatory Visit: Payer: Self-pay | Admitting: Physician Assistant

## 2024-04-09 DIAGNOSIS — Z0289 Encounter for other administrative examinations: Secondary | ICD-10-CM

## 2024-04-09 DIAGNOSIS — F419 Anxiety disorder, unspecified: Secondary | ICD-10-CM

## 2024-04-10 NOTE — Telephone Encounter (Signed)
 Message acknowledged and reviewed. PDMP was reviewed prior to medication refill. Provider also contacted patient's pharmacy to confirm there was no available prescriptions leftover. Patient's medication was e-prescribed to preferred pharmacy.

## 2024-04-17 ENCOUNTER — Encounter: Payer: Self-pay | Admitting: Family Medicine

## 2024-04-18 ENCOUNTER — Ambulatory Visit (INDEPENDENT_AMBULATORY_CARE_PROVIDER_SITE_OTHER): Admitting: Clinical

## 2024-04-18 DIAGNOSIS — F331 Major depressive disorder, recurrent, moderate: Secondary | ICD-10-CM | POA: Diagnosis not present

## 2024-04-18 NOTE — Progress Notes (Signed)
 THERAPIST PROGRESS NOTE Virtual Visit via Video Note  I connected with Juel Nutley on 04/18/2024 at  8:00 AM EDT by a video enabled telemedicine application and verified that I am speaking with the correct person using two identifiers.  Location: Patient: home Provider: office   I discussed the limitations of evaluation and management by telemedicine and the availability of in person appointments. The patient expressed understanding and agreed to proceed.   Follow Up Instructions: I discussed the assessment and treatment plan with the patient. The patient was provided an opportunity to ask questions and all were answered. The patient agreed with the plan and demonstrated an understanding of the instructions.   The patient was advised to call back or seek an in-person evaluation if the symptoms worsen or if the condition fails to improve as anticipated.    Session Time: 45 minutes  Participation Level: Active  Behavioral Response: CasualAlertDepressed  Type of Therapy: Individual Therapy  Treatment Goals addressed: client will engage in at least 80% of scheduled individual psychotherapy sessions  ProgressTowards Goals: Progressing  Interventions: CBT and Supportive  Summary:  Dannette Mcnalley is a 43 y.o. female who presents for the scheduled appointment oriented x 5, appropriately dressed, and friendly.  Client denied hallucinations and delusions. Client reported on today she is fairly okay.  Client reported she gave birth to her daughter 3 weeks ago.  Client reported her daughter is premature but is at home and doing well.  Client reported she has had a mix of thoughts of emotions since bringing her baby home.  Client reported her other children also have milestone events going on such as transitioning to the next grade, finishing a last year of high school and going off to prom.  Client reported she finds herself being reminded of things that she used to  teach to other women about transitioning from her pregnancy.  Client reported she does not want to go back to the state of depression which she has some worry about.  Client reported every time she has had a baby and has brought about a different perspective on life and how she went to the household.  Client reported it is hard for her to initiate that spark of right and changing the dynamic to be more positive and uplifting in the house.  Client reported she wants to be as present as possible with raising all of her kids and not making them feel like they just exist. Evidence of progress towards goal: Client reported at least 2 cognitive patterns that relate to her depressive symptoms.  Suicidal/Homicidal: Nowithout intent/plan  Therapist Response:  Therapist began the appointment asking the client how she has been doing since last seen Therapist engaged with active listening and positive emotional support. Therapist asked the client how she is adjusting to her daily routines since having her baby in house affecting her mental health. Therapist used CBT to normalize the clients emotional response within reason. Therapist used CBT to teach the client about healthy coping skills to help with adjusting to improving and reinforcing her previous knowledge of what she was used to help with depression. Therapist used CBT ask the client to identify her progress with frequency of use with coping skills with continued practice in her daily activity.      Plan: Return again in 3 weeks.  Diagnosis: Major depressive disorder, recurrent episode, moderate with anxious distress  Collaboration of Care: Other client inquired with the therapist about asking the psychiatrist about  a letter to help with work accommodations.  Patient/Guardian was advised Release of Information must be obtained prior to any record release in order to collaborate their care with an outside provider. Patient/Guardian was advised if they  have not already done so to contact the registration department to sign all necessary forms in order for us  to release information regarding their care.   Consent: Patient/Guardian gives verbal consent for treatment and assignment of benefits for services provided during this visit. Patient/Guardian expressed understanding and agreed to proceed.   Refujio Haymer Y Annella Prowell, LCSW 04/18/2024

## 2024-04-19 ENCOUNTER — Encounter (HOSPITAL_COMMUNITY): Payer: Self-pay | Admitting: Physician Assistant

## 2024-04-19 ENCOUNTER — Encounter: Payer: Self-pay | Admitting: Family Medicine

## 2024-04-22 ENCOUNTER — Other Ambulatory Visit (HOSPITAL_COMMUNITY): Payer: Self-pay

## 2024-04-23 ENCOUNTER — Other Ambulatory Visit (HOSPITAL_COMMUNITY): Payer: Self-pay

## 2024-04-29 ENCOUNTER — Other Ambulatory Visit: Payer: Self-pay | Admitting: Physician Assistant

## 2024-04-29 DIAGNOSIS — O99340 Other mental disorders complicating pregnancy, unspecified trimester: Secondary | ICD-10-CM

## 2024-04-29 DIAGNOSIS — F419 Anxiety disorder, unspecified: Secondary | ICD-10-CM

## 2024-05-02 ENCOUNTER — Encounter: Payer: Self-pay | Admitting: Family Medicine

## 2024-05-03 ENCOUNTER — Institutional Professional Consult (permissible substitution): Admitting: Physician Assistant

## 2024-05-07 ENCOUNTER — Other Ambulatory Visit: Payer: Self-pay

## 2024-05-07 ENCOUNTER — Encounter: Payer: Self-pay | Admitting: Family Medicine

## 2024-05-07 ENCOUNTER — Ambulatory Visit (INDEPENDENT_AMBULATORY_CARE_PROVIDER_SITE_OTHER): Admitting: Family Medicine

## 2024-05-07 VITALS — BP 141/103 | HR 116 | Wt 225.8 lb

## 2024-05-07 DIAGNOSIS — F411 Generalized anxiety disorder: Secondary | ICD-10-CM

## 2024-05-07 DIAGNOSIS — G5603 Carpal tunnel syndrome, bilateral upper limbs: Secondary | ICD-10-CM | POA: Diagnosis not present

## 2024-05-07 DIAGNOSIS — I10 Essential (primary) hypertension: Secondary | ICD-10-CM

## 2024-05-07 DIAGNOSIS — Z3009 Encounter for other general counseling and advice on contraception: Secondary | ICD-10-CM

## 2024-05-07 DIAGNOSIS — Z8751 Personal history of pre-term labor: Secondary | ICD-10-CM

## 2024-05-07 DIAGNOSIS — F17200 Nicotine dependence, unspecified, uncomplicated: Secondary | ICD-10-CM

## 2024-05-07 DIAGNOSIS — Z8759 Personal history of other complications of pregnancy, childbirth and the puerperium: Secondary | ICD-10-CM

## 2024-05-07 DIAGNOSIS — F331 Major depressive disorder, recurrent, moderate: Secondary | ICD-10-CM

## 2024-05-07 DIAGNOSIS — Z1231 Encounter for screening mammogram for malignant neoplasm of breast: Secondary | ICD-10-CM

## 2024-05-07 MED ORDER — LISINOPRIL 20 MG PO TABS: 20.0000 mg | ORAL_TABLET | Freq: Every day | ORAL | 3 refills | Status: AC

## 2024-05-07 NOTE — Progress Notes (Signed)
 Carpal Tunnel Injection Procedure Note This is her second injection of bilateral wrists.   After obtaining informed verbal and written consent for carpal tunnel injection, proper site was identified on the left wrist 1 cm medial to the palmaris longus tendon and 1 cm proximal to the distal palmar crease. The area was cleaned with alcohol and then anesthetized with hurricane spray. A mixture of 2 cc 10 mg/mL Kenalog (total of 20 mg) and 1 cc of 1% lidocaine  was injected by directing the needle towards the base of the thumb at a 45 degree angle at a depth of approximately 1 cm. Mixture flowed easily and patient reported no paresthesias during the procedure. A bandaid was then applied and patient instructed to mobilize wrist for remainder of day to help solution spread. Patient tolerated the procedure well.  The other wrist was then injected in the same manner.    2:28 PM 05/07/24   Teena Feast, MD/MPH Attending Family Medicine Physician, [facility] for [facility],  [facility]

## 2024-05-07 NOTE — Progress Notes (Signed)
 Post Partum Visit Note  Paula Dominguez is a 43 y.o. [id number] female who presents for a postpartum visit. She is 6 weeks postpartum following a normal spontaneous vaginal delivery.  I have fully reviewed the prenatal and intrapartum course. The delivery was at 33 gestational weeks.  Anesthesia: epidural. Postpartum course has been uneventful. Baby is doing well. Baby is feeding by bottle. Bleeding no bleeding. Bowel function is normal. Bladder function is normal. Patient is not sexually active. Contraception method is Depo-Provera  injections. Postpartum depression screening: positive.   The pregnancy intention screening data noted above was reviewed. Potential methods of contraception were discussed. The patient elected to proceed with No data recorded.   Edinburgh Postnatal Depression Scale - 05/07/24 1357       Edinburgh Postnatal Depression Scale:  In the Past 7 Days   I have been able to laugh and see the funny side of things. 1    I have looked forward with enjoyment to things. 1    I have blamed myself unnecessarily when things went wrong. 3    I have been anxious or worried for no good reason. 3    I have felt scared or panicky for no good reason. 3    Things have been getting on top of me. 2    I have been so unhappy that I have had difficulty sleeping. 2    I have felt sad or miserable. 2    I have been so unhappy that I have been crying. 2    The thought of harming myself has occurred to me. 0    Edinburgh Postnatal Depression Scale Total 19             Health Maintenance Due  Topic Date Due   COVID-19 Vaccine (1 - 2024-25 season) Never done    The following portions of the patient's history were reviewed and updated as appropriate: allergies, current medications, past family history, past medical history, past social history, past surgical history, and problem list.  Review of Systems Pertinent items noted in HPI and remainder of comprehensive ROS  otherwise negative.  Objective:  BP (!) 141/103   Pulse (!) 116   Wt 225 lb 12.8 oz (102.4 kg)   LMP 05/06/2024 (Exact Date)   Breastfeeding No   BMI 35.37 kg/m    General:  alert, cooperative, and appears stated age   Breasts:  not indicated  Lungs: Comfortalbe on room air  Wound N/a  GU exam:  not indicated        Assessment:   Unwanted fertility  Tobacco use disorder  Major depressive disorder, recurrent episode, moderate with anxious distress (HCC)  History of severe pre-eclampsia  History of preterm delivery  Essential hypertension  Generalized anxiety disorder  Bilateral carpal tunnel syndrome  Normal postpartum exam.   Plan:   Essential components of care per ACOG recommendations:  1.  Mood and well being: Patient with positive depression screening today. Reviewed local resources for support.  - she is already engaged with psychiatry - Patient tobacco use? Yes. Patient desires to quit? Yes.Discussed reduction and cessation  - hx of drug use? No.    2. Infant care and feeding:  -Patient currently breastmilk feeding? No.  -Social determinants of health (SDOH) reviewed in EPIC. No concerns  3. Sexuality, contraception and birth spacing - Patient does not want a pregnancy in the next year.  Desired family size is 8 children.  - Reviewed reproductive life planning. Reviewed  contraceptive methods based on pt preferences and effectiveness.  Patient desired Hormonal Injection today.   - Discussed birth spacing of 18 months  4. Sleep and fatigue -Encouraged family/partner/community support of 4 hrs of uninterrupted sleep to help with mood and fatigue  5. Physical Recovery  - Discussed patients delivery and complications. She describes her labor as good. - Patient had a Vaginal, no problems at delivery. Patient had no laceration. Perineal healing reviewed. Patient expressed understanding - Patient has urinary incontinence? No. - Patient is safe to resume  physical and sexual activity  6.  Health Maintenance - HM due items addressed Yes - Last pap smear  Diagnosis  Date Value Ref Range Status  11/14/2023   Final   - Negative for intraepithelial lesion or malignancy (NILM)   Pap smear not done at today's visit.  -Breast Cancer screening indicated? Yes. Patient referred today for mammogram.   7. Chronic Disease/Pregnancy Condition follow up:  1. Unwanted fertility   2. Tobacco use disorder   3. Major depressive disorder, recurrent episode, moderate with anxious distress (HCC)   4. History of severe pre-eclampsia   5. History of preterm delivery   6. Essential hypertension   7. Generalized anxiety disorder   8. Bilateral carpal tunnel syndrome   9. Encounter for screening mammogram for malignant neoplasm of breast    Lots of conversations in prior visits regarding this, but for now wants to continue depo.   2. Mostly using patches and occasional vape, encouraged to quit completely  3,7. Seeing psychiatry, feels like things are going much better, they have taken over klonopin  rx, also added trazodone  PRN and zoloft .   4,6. Elevated BP, only taking lisinopril  10 mg, increase to 20 mg and check CMP at next visit, no longer taking labetalol , will d/c. Advised against pregnancy with ACE and also to take lisinopril  before bed.   8. S/p bilateral injection three months prior, symptoms have recurred. Discussed we can repeat injection today, but if symptoms recur she will need to go see a hand specialist.  9. Due for mammo, discussed and ordered today  - PCP follow up   Teena Feast, MD/MPH Attending Family Medicine Physician, [facility] for [facility], [facility]

## 2024-05-07 NOTE — Progress Notes (Signed)
 error

## 2024-05-08 ENCOUNTER — Telehealth (HOSPITAL_COMMUNITY): Payer: Self-pay | Admitting: Physician Assistant

## 2024-05-08 ENCOUNTER — Telehealth (HOSPITAL_COMMUNITY): Payer: Self-pay

## 2024-05-08 ENCOUNTER — Other Ambulatory Visit (HOSPITAL_COMMUNITY): Payer: Self-pay | Admitting: Physician Assistant

## 2024-05-08 ENCOUNTER — Encounter (HOSPITAL_COMMUNITY): Admitting: Physician Assistant

## 2024-05-08 NOTE — Telephone Encounter (Signed)
 Patient states she has been awaiting approval for PA since 04/29/24. Please advise.

## 2024-05-08 NOTE — Telephone Encounter (Signed)
 Patient's medication was e-prescribed to pharmacy of choice. PDMP was reviewed prior to patient's prescription being filled.

## 2024-05-08 NOTE — Telephone Encounter (Signed)
 Pt is requesting for a refill on Klonopin  to be sent to on file pharmacy.     JNL, CMA

## 2024-05-09 ENCOUNTER — Ambulatory Visit (HOSPITAL_COMMUNITY): Admitting: Physician Assistant

## 2024-05-09 ENCOUNTER — Ambulatory Visit (HOSPITAL_COMMUNITY): Admitting: Clinical

## 2024-05-09 ENCOUNTER — Encounter (HOSPITAL_COMMUNITY): Payer: Self-pay | Admitting: Physician Assistant

## 2024-05-09 DIAGNOSIS — F411 Generalized anxiety disorder: Secondary | ICD-10-CM

## 2024-05-09 DIAGNOSIS — F410 Panic disorder [episodic paroxysmal anxiety] without agoraphobia: Secondary | ICD-10-CM | POA: Diagnosis not present

## 2024-05-09 DIAGNOSIS — F99 Mental disorder, not otherwise specified: Secondary | ICD-10-CM

## 2024-05-09 DIAGNOSIS — F5105 Insomnia due to other mental disorder: Secondary | ICD-10-CM

## 2024-05-09 DIAGNOSIS — F331 Major depressive disorder, recurrent, moderate: Secondary | ICD-10-CM

## 2024-05-09 MED ORDER — SERTRALINE HCL 50 MG PO TABS: 50.0000 mg | ORAL_TABLET | Freq: Every day | ORAL | 2 refills | Status: DC

## 2024-05-09 MED ORDER — TRAZODONE HCL 50 MG PO TABS: 50.0000 mg | ORAL_TABLET | Freq: Every day | ORAL | 2 refills | Status: DC

## 2024-05-09 NOTE — Progress Notes (Signed)
 [facility] MD/PA/NP OP Progress Note  05/09/2024 12:09 PM Paula Dominguez  MRN:  [id number]  Chief Complaint:  Chief Complaint  Patient presents with   Follow-up   Medication Management   HPI: ***  Paula Dominguez is a 43 year old female with a past psychiatric history significant for generalized anxiety disorder, panic attacks, insomnia (due to other mental disorder), and major depressive disorder (recurrent episode, moderate with anxious distress) who presents to [facility] for follow-up and medication management. Patient is currently being managed on the following psychiatric medications:  Sertraline  25 mg daily Trazodone  50 mg at bedtime Clonazepam  0.5 mg 2 times daily as needed  Patient presents to the encounter stating that she has been taking her medications regularly and denies experiencing any adverse side effects.  Patient reports that she is unsure if her medications have been helpful since having her baby.  She reports that she did experience grieving during her pregnancy and states that she was stressed out whenever her baby had to be in the NICU for 30 days.  In regards to her use of clonazepam , patient reports that she has been taking the medications more regularly lately.  She reports that she recently started work 3 weeks early and states that clonazepam  has been helpful with her transition back to working.  When taking clonazepam , patient reports that the medication goes into effect within 10 to 15 minutes.  She reports that the effects of clonazepam  last the whole day.  Despite experiencing some decreased energy when taking the medication, patient denies any other side effects associated when taking clonazepam .  She does report that she occasionally tries other methods with managing her anxiety such as meditating and going for long walks.  Since being placed on her medications, patient reports that she feels more engaged with her  family and has been spending more time with her kids.  She reports that she occasionally notices anxious moments at times as well as crying spells on occasion.  Patient states that her anxiety is still elevated and rates her anxiety an 11 out of 10.  She reports that her anxiety is attributed to grieving over her deceased children.  Patient also reports that she still experiences panic attacks stating that she had a few panic attacks since her baby has been born.  In addition to anxiety, patient endorses depression and rates her depression a 4 out of 10 with 10 being most severe.  Patient endorses depressive episodes every day.  Patient endorses the following depressive symptoms: crying spells, feelings of sadness, and irritability.  Patient denies feelings of guilt/worthlessness, decreased energy, lack of motivation, or hopelessness.  A PHQ-9 screen was performed with the patient scoring a 1.  A GAD-7 screen was also performed with the patient scoring a 19.  Patient is alert and oriented x 4, calm, cooperative, and fully engaged in conversation during the encounter.  Patient endorses good mood.  Patient exhibits euthymic mood with appropriate affect.  Patient denies suicidal or homicidal ideations.  She further denies auditory or visual hallucinations and does not appear to be responding to internal/external stimuli.  Patient endorses fair sleep and receives on average 4 to 6 hours of intermittent sleep.  Patient endorses good appetite and eats on average 3-4 meals per day.  Patient denies alcohol consumption or illicit drug use.  Patient denies tobacco use but does engage in vaping.  Visit Diagnosis:    ICD-10-CM   1. Major depressive disorder, recurrent episode,  moderate with anxious distress (HCC)  F33.1 sertraline  (ZOLOFT ) 50 MG tablet    2. Generalized anxiety disorder  F41.1 sertraline  (ZOLOFT ) 50 MG tablet    3. Panic disorder  F41.0 sertraline  (ZOLOFT ) 50 MG tablet    4. Insomnia due to other  mental disorder  F51.05 traZODone  (DESYREL ) 50 MG tablet   F99       Past Psychiatric History:  Patient has a past psychiatric history significant for depression, anxiety, and panic attacks.   Patient endorses a past history of hospitalization due to mental health stating that she was hospitalized at the age of 27.  Patient reports that she was hospitalized due to ingesting Clorox in a suicide attempt.   Patient endorses a past history of suicide attempt.   Patient denies a past history of homicide attempt.  Past Medical History:  Past Medical History:  Diagnosis Date   Anemia    Anxiety and depression    Blood transfusion without reported diagnosis    2014, 2018   GERD (gastroesophageal reflux disease)    History of preterm delivery, currently pregnant 01/30/2024   Hx of preeclampsia, prior pregnancy, currently pregnant    prior pregnancy   Hypertension     Past Surgical History:  Procedure Laterality Date   HEMORRHOID SURGERY      Family Psychiatric History:  Patient denies a family history of psychiatric illness   Family history of suicide attempt: Patient denies Family history of homicide attempt: Patient denies Family history of substance abuse: Patient denies  Family History:  Family History  Problem Relation Age of Onset   Cancer Mother    Healthy Mother    Heart disease Father    Healthy Father    Asthma Daughter    Asthma Son    Hypertension Maternal Grandmother    Diabetes Maternal Grandmother    Cancer Maternal Grandmother    Cancer Maternal Grandfather    Cancer Paternal Grandmother    Cancer Paternal Grandfather    Hypertension Other    Cancer Other     Social History:  Social History   Socioeconomic History   Marital status: Married    Spouse name: Sallee Unruh   Number of children: 5   Years of education: Not on file   Highest education level: Not on file  Occupational History   Occupation: CERTIFIED NURSING ASSISTANT    Employer:  [employer]  Tobacco Use   Smoking status: Former    Current packs/day: 0.00    Average packs/day: 0.3 packs/day for 17.0 years (4.3 ttl pk-yrs)    Types: Cigarettes    Start date: 07/27/1996    Quit date: 07/27/2013    Years since quitting: 10.7   Smokeless tobacco: Never   Tobacco comments:    3 cigs a day  Vaping Use   Vaping status: Former   Quit date: 10/14/2023   Substances: Nicotine , Flavoring  Substance and Sexual Activity   Alcohol use: No   Drug use: No   Sexual activity: Yes    Partners: Male  Other Topics Concern   Not on file  Social History Narrative   Not on file   Social Drivers of Health   Financial Resource Strain: Not on file  Food Insecurity: No Food Insecurity (03/19/2024)   Hunger Vital Sign    Worried About Running Out of Food in the Last Year: Never true    Ran Out of Food in the Last Year: Never true  Transportation Needs: No  Transportation Needs (03/19/2024)   PRAPARE - Administrator, Civil Service (Medical): No    Lack of Transportation (Non-Medical): No  Physical Activity: Not on file  Stress: Not on file  Social Connections: Not on file    Allergies: No Known Allergies  Metabolic Disorder Labs: Lab Results  Component Value Date   HGBA1C 5.5 11/14/2023   No results found for: "PROLACTIN" No results found for: "CHOL", "TRIG", "HDL", "CHOLHDL", "VLDL", "LDLCALC" No results found for: "TSH"  Therapeutic Level Labs: No results found for: "LITHIUM" No results found for: "VALPROATE" No results found for: "CBMZ"  Current Medications: Current Outpatient Medications  Medication Sig Dispense Refill   acetaminophen  (TYLENOL ) 325 MG tablet Take 2 tablets (650 mg total) by mouth every 4 (four) hours as needed (for pain scale < 4). (Patient not taking: Reported on 05/07/2024)     acetaminophen -caffeine  (EXCEDRIN  TENSION HEADACHE) 500-65 MG TABS per tablet Take 2 tablets by mouth every 6 (six) hours as needed (headache,  aches and pains, fever). 60 tablet 0   clonazePAM  (KLONOPIN ) 0.5 MG tablet TAKE 1 TABLET(0.5 MG) BY MOUTH TWICE DAILY AS NEEDED FOR ANXIETY 30 tablet 0   cyclobenzaprine  (FLEXERIL ) 10 MG tablet Take 1 tablet (10 mg total) by mouth 2 (two) times daily as needed for muscle spasms. 20 tablet 2   ferrous sulfate  325 (65 FE) MG tablet Take 1 tablet (325 mg total) by mouth every other day. 15 tablet 0   furosemide  (LASIX ) 20 MG tablet Take 1 tablet (20 mg total) by mouth daily for 5 days. 5 tablet 0   hydrOXYzine  (ATARAX ) 10 MG tablet Take 1 tablet (10 mg total) by mouth 3 (three) times daily as needed for anxiety. (Patient not taking: Reported on 05/07/2024) 30 tablet 1   ibuprofen  (ADVIL ) 600 MG tablet Take 1 tablet (600 mg total) by mouth every 6 (six) hours. (Patient not taking: Reported on 05/07/2024) 30 tablet 0   lisinopril  (ZESTRIL ) 20 MG tablet Take 1 tablet (20 mg total) by mouth daily. 90 tablet 3   magnesium  oxide (MAG-OX) 400 MG tablet Take 1 tablet (400 mg total) by mouth daily. (Patient not taking: Reported on 05/07/2024) 90 tablet 0   Misc. Devices (WRIST BRACE) MISC 2 Devices by Does not apply route as needed. (Patient not taking: Reported on 02/27/2024) 2 each 0   Misc. Devices MISC 1 Device by Does not apply route as needed. Ergonomic keyboard for carpal tunnel syndrome (Patient not taking: Reported on 05/07/2024) 1 each 0   nicotine  (NICODERM CQ  - DOSED IN MG/24 HOURS) 14 mg/24hr patch Place 1 patch (14 mg total) onto the skin daily. 28 patch 1   nicotine  polacrilex (COMMIT) 4 MG lozenge Take 1 lozenge (4 mg total) by mouth as needed for smoking cessation. (Patient not taking: Reported on 05/07/2024) 100 tablet 0   omeprazole  (PRILOSEC) 40 MG capsule Take 1 capsule (40 mg total) by mouth in the morning and at bedtime. (Patient not taking: Reported on 05/07/2024) 60 capsule 5   oxyCODONE  (ROXICODONE ) 5 MG immediate release tablet Take 1 tablet (5 mg total) by mouth every 6 (six) hours as needed  for severe pain (pain score 7-10). (Patient not taking: Reported on 05/07/2024) 10 tablet 0   polyethylene glycol powder (GLYCOLAX /MIRALAX ) 17 GM/SCOOP powder Take 17 g by mouth daily. (Patient not taking: Reported on 05/07/2024) 510 g 2   potassium chloride  SA (KLOR-CON  M) 20 MEQ tablet Take 1 tablet (20 mEq total) by mouth  2 (two) times daily. (Patient not taking: Reported on 05/07/2024) 10 tablet 0   Prenatal Vit-Fe Fumarate-FA (PNV PRENATAL PLUS MULTIVITAMIN) 27-1 MG TABS Take 1 capsule by mouth daily. 90 tablet 6   sertraline  (ZOLOFT ) 50 MG tablet Take 1 tablet (50 mg total) by mouth daily. 30 tablet 2   traZODone  (DESYREL ) 50 MG tablet Take 1 tablet (50 mg total) by mouth at bedtime. 30 tablet 2   No current facility-administered medications for this visit.     Musculoskeletal: Strength & Muscle Tone: within normal limits Gait & Station: normal Patient leans: N/A  Psychiatric Specialty Exam: Review of Systems  Psychiatric/Behavioral:  Positive for dysphoric mood and sleep disturbance. Negative for decreased concentration, hallucinations and self-injury. The patient is nervous/anxious. The patient is not hyperactive.     Blood pressure (!) 167/123, pulse 86, height 5\' 7"  (1.702 m), weight 222 lb 6.4 oz (100.9 kg), last menstrual period 05/06/2024, SpO2 100%, not currently breastfeeding.Body mass index is 34.83 kg/m.  General Appearance: Casual  Eye Contact:  Good  Speech:  Clear and Coherent and Normal Rate  Volume:  Normal  Mood:  Anxious and Depressed  Affect:  Appropriate  Thought Process:  Coherent, Goal Directed, and Descriptions of Associations: Intact  Orientation:  Full (Time, Place, and Person)  Thought Content: WDL   Suicidal Thoughts:  No  Homicidal Thoughts:  No  Memory:  Immediate;   Good Recent;   Good Remote;   Good  Judgement:  Good  Insight:  Good  Psychomotor Activity:  Normal  Concentration:  Concentration: Good and Attention Span: Good  Recall:  Good   Fund of Knowledge: Good  Language: Good  Akathisia:  No  Handed:  Right  AIMS (if indicated): not done  Assets:  Communication Skills Desire for Improvement Housing Resilience Social Support Transportation Vocational/Educational  ADL's:  Intact  Cognition: WNL  Sleep:  Fair   Screenings: GAD-7    Flowsheet Row Clinical Support from 05/09/2024 in [facility] Postpartum Visit from 05/07/2024 in Center [facility] Office Visit from 03/27/2024 in [facility] Counselor from 03/01/2024 in [facility] Clinical Support from 10/06/2023 in [facility] [place]  Total GAD-7 Score 19 15 15 20 21       PHQ2-9    Flowsheet Row Clinical Support from 05/09/2024 in [facility] Postpartum Visit from 05/07/2024 in [facility] for [facility] Office Visit from 03/27/2024 in [facility] Counselor from 03/01/2024 in [facility] Clinical Support from 10/06/2023 in [facility] Total Score 1 0 4 6 6   PHQ-9 Total Score -- 0 17 22 19       Flowsheet Row Clinical Support from 05/09/2024 in [facility] Office Visit from 03/27/2024 in [facility] Admission (Discharged) from 03/19/2024 in [facility]  C-SSRS RISK CATEGORY Moderate Risk Moderate Risk No Risk        Assessment and Plan: ***  Paula Dominguez is a 43 year old female with a past psychiatric history significant for generalized anxiety disorder, panic attacks, insomnia (due to other mental disorder), and major depressive disorder (recurrent episode, moderate with anxious distress) who presents to [facility] for  follow-up and medication management.  Patient reports that  she has been taking her medications regularly and denies experiencing any adverse side effects.  She reports that her use of clonazepam  has been extremely effective in managing her anxiety.  She reports that she has noticed using the medication more regularly since transitioning into work but states that she will try other methods to manage her anxiety such as meditating and going for long walks.  Provider to continue to monitor patient's use of clonazepam .  Though patient endorses improved mood since being placed on her medications, she does endorse elevated anxiety and has experienced a few panic attacks since the birth of her child.  Patient also states that she has been experiencing depression on occasion.  A PHQ-9 screen was performed with the patient scoring the 1.  A GAD-7 screen was also performed with the patient scoring a 19.  Provider recommended increasing patient's sertraline  dosage from 25 to 50 mg daily for the management of her depressive symptoms and anxiety.  Patient was agreeable to recommendation.  Patient's medication to be e-prescribed to pharmacy of choice.  Patient endorses fair sleep but states that her sleep has been interrupted due to having to take care of her newborn child in the middle of the night.  Patient is willing to continue taking her trazodone  as prescribed.  Patient's medication to be e-prescribed to pharmacy of choice.  A [place] Suicide Severity Rating Scale was performed with the patient being considered moderate risk.  Patient denies suicidal ideations and is able to contract for her safety following the conclusion of the encounter.  Patient is still attending therapy sessions with a licensed clinical social worker at this facility.  Collaboration of Care: Collaboration of Care: Medication Management AEB provider managing patient's psychiatric medications, Primary Care Provider AEB patient being followed by  a primary care provider (family medicine), Psychiatrist AEB patient being followed by mental health provider at this facility, Other provider involved in patient's care AEB patient being seen by OB/GYN, and Referral or follow-up with counselor/therapist AEB patient being seen by a licensed clinical social worker at this facility  Patient/Guardian was advised Release of Information must be obtained prior to any record release in order to collaborate their care with an outside provider. Patient/Guardian was advised if they have not already done so to contact the registration department to sign all necessary forms in order for us  to release information regarding their care.   Consent: Patient/Guardian gives verbal consent for treatment and assignment of benefits for services provided during this visit. Patient/Guardian expressed understanding and agreed to proceed.   1. Major depressive disorder, recurrent episode, moderate with anxious distress (HCC)  - sertraline  (ZOLOFT ) 50 MG tablet; Take 1 tablet (50 mg total) by mouth daily.  Dispense: 30 tablet; Refill: 2  2. Generalized anxiety disorder Patient to continue taking clonazepam  0.5 mg 2 times daily as needed for the management of her generalized anxiety disorder  - sertraline  (ZOLOFT ) 50 MG tablet; Take 1 tablet (50 mg total) by mouth daily.  Dispense: 30 tablet; Refill: 2  3. Panic disorder Patient to continue taking clonazepam  0.5 mg 2 times daily as needed for the management of her panic disorders  - sertraline  (ZOLOFT ) 50 MG tablet; Take 1 tablet (50 mg total) by mouth daily.  Dispense: 30 tablet; Refill: 2  4. Insomnia due to other mental disorder  - traZODone  (DESYREL ) 50 MG tablet; Take 1 tablet (50 mg total) by mouth at bedtime.  Dispense: 30 tablet; Refill: 2  Patient to follow up in  2 months Provider spent a total of 28 minutes with the patient/reviewing patient's chart  Gates Kasal, PA 05/09/2024, 12:09 PM

## 2024-05-28 ENCOUNTER — Other Ambulatory Visit: Payer: Self-pay | Admitting: Advanced Practice Midwife

## 2024-05-28 ENCOUNTER — Other Ambulatory Visit: Payer: Self-pay | Admitting: Family Medicine

## 2024-05-28 DIAGNOSIS — O09899 Supervision of other high risk pregnancies, unspecified trimester: Secondary | ICD-10-CM

## 2024-05-28 DIAGNOSIS — K219 Gastro-esophageal reflux disease without esophagitis: Secondary | ICD-10-CM

## 2024-05-28 DIAGNOSIS — O0990 Supervision of high risk pregnancy, unspecified, unspecified trimester: Secondary | ICD-10-CM

## 2024-05-30 ENCOUNTER — Ambulatory Visit (HOSPITAL_COMMUNITY): Admitting: Clinical

## 2024-05-31 ENCOUNTER — Telehealth: Payer: Self-pay | Admitting: Lactation Services

## 2024-05-31 NOTE — Telephone Encounter (Signed)
 Received a refill request for Progesterone  suppositories. Called patient, she is no longer using. Called [facility] in [place] and requested cancelling Rx. Was informed it is cancelled.

## 2024-06-10 ENCOUNTER — Ambulatory Visit (INDEPENDENT_AMBULATORY_CARE_PROVIDER_SITE_OTHER)

## 2024-06-10 ENCOUNTER — Other Ambulatory Visit: Payer: Self-pay

## 2024-06-10 VITALS — BP 133/83 | HR 98 | Wt 220.0 lb

## 2024-06-10 DIAGNOSIS — Z3042 Encounter for surveillance of injectable contraceptive: Secondary | ICD-10-CM | POA: Diagnosis not present

## 2024-06-10 MED ORDER — MEDROXYPROGESTERONE ACETATE 150 MG/ML IM SUSY
150.0000 mg | PREFILLED_SYRINGE | Freq: Once | INTRAMUSCULAR | Status: AC
  Administered 2024-06-10: 150 mg via INTRAMUSCULAR

## 2024-06-10 NOTE — Progress Notes (Signed)
 Juel Nutley here for Depo-Provera  Injection. Injection administered without complication. Patient will return in 3 months for next injection between 6/15 and 6/29. Next annual visit due 04/2025.   Lennart Quitter, RN 06/10/2024  10:25 AM

## 2024-06-12 ENCOUNTER — Other Ambulatory Visit: Payer: Self-pay | Admitting: Lactation Services

## 2024-06-12 DIAGNOSIS — O0990 Supervision of high risk pregnancy, unspecified, unspecified trimester: Secondary | ICD-10-CM

## 2024-06-12 DIAGNOSIS — K219 Gastro-esophageal reflux disease without esophagitis: Secondary | ICD-10-CM

## 2024-06-12 MED ORDER — OMEPRAZOLE 40 MG PO CPDR: 40.0000 mg | DELAYED_RELEASE_CAPSULE | Freq: Two times a day (BID) | ORAL | 1 refills | Status: AC

## 2024-06-12 NOTE — Progress Notes (Signed)
 Patient requested 90 day supply, new Rx sent.

## 2024-06-18 ENCOUNTER — Other Ambulatory Visit: Payer: Self-pay | Admitting: Advanced Practice Midwife

## 2024-06-18 ENCOUNTER — Other Ambulatory Visit: Payer: Self-pay | Admitting: Physician Assistant

## 2024-06-18 DIAGNOSIS — F419 Anxiety disorder, unspecified: Secondary | ICD-10-CM

## 2024-06-26 ENCOUNTER — Other Ambulatory Visit: Payer: Self-pay

## 2024-06-26 NOTE — Telephone Encounter (Signed)
 [facility] called requesting refill on Excedrin  tension headache.    Deanie Jupiter,RN

## 2024-06-27 MED ORDER — EXCEDRIN TENSION HEADACHE 500-65 MG PO TABS: 2.0000 | ORAL_TABLET | Freq: Four times a day (QID) | ORAL | 0 refills | Status: AC | PRN

## 2024-07-02 ENCOUNTER — Encounter: Payer: Self-pay | Admitting: Lactation Services

## 2024-07-09 ENCOUNTER — Ambulatory Visit (INDEPENDENT_AMBULATORY_CARE_PROVIDER_SITE_OTHER): Admitting: Physician Assistant

## 2024-07-09 DIAGNOSIS — F5105 Insomnia due to other mental disorder: Secondary | ICD-10-CM | POA: Diagnosis not present

## 2024-07-09 DIAGNOSIS — F410 Panic disorder [episodic paroxysmal anxiety] without agoraphobia: Secondary | ICD-10-CM

## 2024-07-09 DIAGNOSIS — F411 Generalized anxiety disorder: Secondary | ICD-10-CM

## 2024-07-09 DIAGNOSIS — F331 Major depressive disorder, recurrent, moderate: Secondary | ICD-10-CM | POA: Diagnosis not present

## 2024-07-09 DIAGNOSIS — F419 Anxiety disorder, unspecified: Secondary | ICD-10-CM

## 2024-07-09 DIAGNOSIS — F99 Mental disorder, not otherwise specified: Secondary | ICD-10-CM

## 2024-07-09 MED ORDER — CLONAZEPAM 0.5 MG PO TABS: 0.5000 mg | ORAL_TABLET | Freq: Two times a day (BID) | ORAL | 0 refills | Status: DC | PRN

## 2024-07-09 MED ORDER — SERTRALINE HCL 100 MG PO TABS
100.0000 mg | ORAL_TABLET | Freq: Every day | ORAL | 1 refills | Status: DC
Start: 2024-07-09 — End: 2024-09-24

## 2024-07-11 ENCOUNTER — Encounter (HOSPITAL_COMMUNITY): Payer: Self-pay | Admitting: Physician Assistant

## 2024-07-11 MED ORDER — TRAZODONE HCL 50 MG PO TABS
50.0000 mg | ORAL_TABLET | Freq: Every day | ORAL | 1 refills | Status: DC
Start: 2024-07-11 — End: 2024-09-24

## 2024-07-11 NOTE — Progress Notes (Signed)
 [facility] MD/PA/NP OP Progress Note  07/09/2024 11:30 AM Paula Dominguez  MRN:  [id number]  Chief Complaint:  Chief Complaint  Patient presents with   Follow-up   Medication Management   HPI:   Paula Dominguez. Ivey is a 43 year old female with a past psychiatric history significant for generalized anxiety disorder, panic attacks, insomnia (due to other mental disorder), and major depressive disorder (recurrent episode, moderate with anxious distress) who presents to [facility] for follow-up and medication management. Patient is currently being managed on the following psychiatric medications:  Sertraline  25 mg daily Trazodone  50 mg at bedtime Clonazepam  0.5 mg 2 times daily as needed  Patient reports that she still continues to take her medications as prescribed and denies experiencing any adverse side effects.  She reports that when she takes clonazepam , it goes into effect 20 to 30 minutes after taking it.  She reports that the medication last most of the day (4 to 6 hours).  She reports that she does experience some drowsiness when taking her clonazepam .  Patient presents to the encounter stating that she and her current husband are getting a divorce.  Patient feels she does not have time to be stressed or depressed due to all that she has going on.  Patient expresses some irritability over feeling like she is always going through something.  Patient reports that she has considered therapy regarding her current stressors.  Patient endorses symptoms of depression stating that she feels as though her symptoms are starting to resurface.  She believes that her recent divorce is triggering her depression.  She reports that she finds herself not wanting to be around others.  She also reports that her symptoms can also be attributed to postpartum depression with her most recent child only being 85 months old.  Patient rates her depression an 8 out of 10  with 10 being most severe.  Patient endorses depressive episodes every day.  Patient reports that her depression comes out as mean or standoffish.  Patient feels like she is starting all over again since declaring the divorce.  Patient reports that she is unable to go to the store by herself and often finds herself crying more often.  In addition to depression, patient endorses anxiety stating that her nerves are bad.  Due to her anxiety, patient reports that she has not been eating for 3 days.  Patient reports that she is doing her best to ignore everything that she has going on.  Patient is alert and oriented x 4, calm, cooperative, and fully engaged in conversation during the encounter.  Patient describes her mood as sad.  Patient exhibits depressed mood with tearful affect.  Patient denies suicidal or homicidal ideations.  She further denies auditory or visual hallucinations and does not appear to be responding to internal/external stimuli.  Patient endorses fair sleep and received an hour of 4 to 6 hours of sleep per night.  Patient endorses poor appetite and eats on average 1 meal per day.  Patient denies alcohol consumption or illicit drug use.  Patient denies tobacco use but engages in vaping.  Visit Diagnosis:    ICD-10-CM   1. Anxiety during pregnancy  O99.340 clonazePAM  (KLONOPIN ) 0.5 MG tablet   F41.9     2. Severe anxiety  F41.9 clonazePAM  (KLONOPIN ) 0.5 MG tablet    3. Major depressive disorder, recurrent episode, moderate with anxious distress (HCC)  F33.1 sertraline  (ZOLOFT ) 100 MG tablet    4. Generalized anxiety  disorder  F41.1 sertraline  (ZOLOFT ) 100 MG tablet    5. Panic disorder  F41.0 sertraline  (ZOLOFT ) 100 MG tablet      Past Psychiatric History:  Patient has a past psychiatric history significant for depression, anxiety, and panic attacks.   Patient endorses a past history of hospitalization due to mental health stating that she was hospitalized at the age of 25.   Patient reports that she was hospitalized due to ingesting Clorox in a suicide attempt.   Patient endorses a past history of suicide attempt.   Patient denies a past history of homicide attempt.  Past Medical History:  Past Medical History:  Diagnosis Date   Anemia    Anxiety and depression    Blood transfusion without reported diagnosis    2014, 2018   GERD (gastroesophageal reflux disease)    History of preterm delivery, currently pregnant 01/30/2024   Hx of preeclampsia, prior pregnancy, currently pregnant    prior pregnancy   Hypertension     Past Surgical History:  Procedure Laterality Date   HEMORRHOID SURGERY      Family Psychiatric History:  Patient denies a family history of psychiatric illness   Family history of suicide attempt: Patient denies Family history of homicide attempt: Patient denies Family history of substance abuse: Patient denies  Family History:  Family History  Problem Relation Age of Onset   Cancer Mother    Healthy Mother    Heart disease Father    Healthy Father    Asthma Daughter    Asthma Son    Hypertension Maternal Grandmother    Diabetes Maternal Grandmother    Cancer Maternal Grandmother    Cancer Maternal Grandfather    Cancer Paternal Grandmother    Cancer Paternal Grandfather    Hypertension Other    Cancer Other     Social History:  Social History   Socioeconomic History   Marital status: Married    Spouse name: Abbye Lao   Number of children: 5   Years of education: Not on file   Highest education level: Not on file  Occupational History   Occupation: CERTIFIED NURSING ASSISTANT    Employer: [employer]  Tobacco Use   Smoking status: Former    Current packs/day: 0.00    Average packs/day: 0.3 packs/day for 17.0 years (4.3 ttl pk-yrs)    Types: Cigarettes    Start date: 07/27/1996    Quit date: 07/27/2013    Years since quitting: 10.9   Smokeless tobacco: Never   Tobacco comments:    3 cigs a  day  Vaping Use   Vaping status: Former   Quit date: 10/14/2023   Substances: Nicotine , Flavoring  Substance and Sexual Activity   Alcohol use: No   Drug use: No   Sexual activity: Yes    Partners: Male  Other Topics Concern   Not on file  Social History Narrative   Not on file   Social Drivers of Health   Financial Resource Strain: Not on file  Food Insecurity: No Food Insecurity (03/19/2024)   Hunger Vital Sign    Worried About Running Out of Food in the Last Year: Never true    Ran Out of Food in the Last Year: Never true  Transportation Needs: No Transportation Needs (03/19/2024)   PRAPARE - Administrator, Civil Service (Medical): No    Lack of Transportation (Non-Medical): No  Physical Activity: Not on file  Stress: Not on file  Social Connections: Not  on file    Allergies: No Known Allergies  Metabolic Disorder Labs: Lab Results  Component Value Date   HGBA1C 5.5 11/14/2023   No results found for: PROLACTIN No results found for: CHOL, TRIG, HDL, CHOLHDL, VLDL, LDLCALC No results found for: TSH  Therapeutic Level Labs: No results found for: LITHIUM No results found for: VALPROATE No results found for: CBMZ  Current Medications: Current Outpatient Medications  Medication Sig Dispense Refill   acetaminophen  (TYLENOL ) 325 MG tablet Take 2 tablets (650 mg total) by mouth every 4 (four) hours as needed (for pain scale < 4).     acetaminophen -caffeine  (EXCEDRIN  TENSION HEADACHE) 500-65 MG TABS per tablet Take 2 tablets by mouth every 6 (six) hours as needed (headache, aches and pains, fever). 60 tablet 0   [START ON 07/19/2024] clonazePAM  (KLONOPIN ) 0.5 MG tablet Take 1 tablet (0.5 mg total) by mouth 2 (two) times daily as needed for anxiety. 60 tablet 0   cyclobenzaprine  (FLEXERIL ) 10 MG tablet Take 1 tablet (10 mg total) by mouth 2 (two) times daily as needed for muscle spasms. 20 tablet 2   ferrous sulfate  325 (65 FE) MG tablet  Take 1 tablet (325 mg total) by mouth every other day. 15 tablet 0   furosemide  (LASIX ) 20 MG tablet Take 1 tablet (20 mg total) by mouth daily for 5 days. (Patient not taking: Reported on 06/10/2024) 5 tablet 0   hydrOXYzine  (ATARAX ) 10 MG tablet Take 1 tablet (10 mg total) by mouth 3 (three) times daily as needed for anxiety. (Patient not taking: Reported on 06/10/2024) 30 tablet 1   ibuprofen  (ADVIL ) 600 MG tablet Take 1 tablet (600 mg total) by mouth every 6 (six) hours. (Patient not taking: Reported on 06/10/2024) 30 tablet 0   lisinopril  (ZESTRIL ) 20 MG tablet Take 1 tablet (20 mg total) by mouth daily. 90 tablet 3   magnesium  oxide (MAG-OX) 400 MG tablet Take 1 tablet (400 mg total) by mouth daily. (Patient not taking: Reported on 06/10/2024) 90 tablet 0   Misc. Devices (WRIST BRACE) MISC 2 Devices by Does not apply route as needed. 2 each 0   Misc. Devices MISC 1 Device by Does not apply route as needed. Ergonomic keyboard for carpal tunnel syndrome (Patient not taking: Reported on 05/07/2024) 1 each 0   nicotine  (NICODERM CQ  - DOSED IN MG/24 HOURS) 14 mg/24hr patch Place 1 patch (14 mg total) onto the skin daily. 28 patch 1   nicotine  polacrilex (COMMIT) 4 MG lozenge Take 1 lozenge (4 mg total) by mouth as needed for smoking cessation. 100 tablet 0   omeprazole  (PRILOSEC) 40 MG capsule Take 1 capsule (40 mg total) by mouth in the morning and at bedtime. 180 capsule 1   oxyCODONE  (ROXICODONE ) 5 MG immediate release tablet Take 1 tablet (5 mg total) by mouth every 6 (six) hours as needed for severe pain (pain score 7-10). (Patient not taking: Reported on 06/10/2024) 10 tablet 0   polyethylene glycol powder (GLYCOLAX /MIRALAX ) 17 GM/SCOOP powder Take 17 g by mouth daily. 510 g 2   potassium chloride  SA (KLOR-CON  M) 20 MEQ tablet Take 1 tablet (20 mEq total) by mouth 2 (two) times daily. (Patient not taking: Reported on 06/10/2024) 10 tablet 0   Prenatal Vit-Fe Fumarate-FA (PNV PRENATAL PLUS MULTIVITAMIN)  27-1 MG TABS Take 1 capsule by mouth daily. 90 tablet 6   sertraline  (ZOLOFT ) 100 MG tablet Take 1 tablet (100 mg total) by mouth daily. 30 tablet 1   traZODone  (  DESYREL ) 50 MG tablet Take 1 tablet (50 mg total) by mouth at bedtime. 30 tablet 2   No current facility-administered medications for this visit.     Musculoskeletal: Strength & Muscle Tone: within normal limits Gait & Station: normal Patient leans: N/A  Psychiatric Specialty Exam: Review of Systems  Psychiatric/Behavioral:  Positive for dysphoric mood and sleep disturbance. Negative for decreased concentration, hallucinations, self-injury and suicidal ideas. The patient is nervous/anxious. The patient is not hyperactive.     Blood pressure (!) 141/92, pulse 85, temperature 98.5 F (36.9 C), temperature source Oral, height 5' 7 (1.702 m), weight 216 lb 6.4 oz (98.2 kg), SpO2 100%, not currently breastfeeding.Body mass index is 33.89 kg/m.  General Appearance: Casual  Eye Contact:  Good  Speech:  Clear and Coherent and Normal Rate  Volume:  Normal  Mood:  Anxious and Depressed  Affect:  Congruent and Tearful  Thought Process:  Coherent, Goal Directed, and Descriptions of Associations: Intact  Orientation:  Full (Time, Place, and Person)  Thought Content: WDL   Suicidal Thoughts:  No  Homicidal Thoughts:  No  Memory:  Immediate;   Good Recent;   Good Remote;   Good  Judgement:  Good  Insight:  Good  Psychomotor Activity:  Normal  Concentration:  Concentration: Good and Attention Span: Good  Recall:  Good  Fund of Knowledge: Good  Language: Good  Akathisia:  No  Handed:  Right  AIMS (if indicated): not done  Assets:  Communication Skills Desire for Improvement Housing Resilience Social Support Transportation Vocational/Educational  ADL's:  Intact  Cognition: WNL  Sleep:  Fair   Screenings: GAD-7    Flowsheet Row Clinical Support from 07/09/2024 in [facility] Clinical  Support from 05/09/2024 in [facility] Postpartum Visit from 05/07/2024 in Center for [facility] Office Visit from 03/27/2024 in [facility] Counselor from 03/01/2024 in [facility]  Total GAD-7 Score 21 19 15 15 20    PHQ2-9    Flowsheet Row Clinical Support from 07/09/2024 in [facility] Clinical Support from 05/09/2024 in [facility] Postpartum Visit from 05/07/2024 in Center for [facility] Office Visit from 03/27/2024 in [facility] Counselor from 03/01/2024 in [facility]  PHQ-2 Total Score 6 1 0 4 6  PHQ-9 Total Score 17 -- 0 17 22   Flowsheet Row Clinical Support from 07/09/2024 in [facility] Clinical Support from 05/09/2024 in [facility] Office Visit from 03/27/2024 in [facility]  C-SSRS RISK CATEGORY Moderate Risk Moderate Risk Moderate Risk     Assessment and Plan:   Paula Dominguez. Pavlovich is a 43 year old female with a past psychiatric history significant for generalized anxiety disorder, panic attacks, insomnia (due to other mental disorder), and major depressive disorder (recurrent episode, moderate with anxious distress) who presents to [facility] for follow-up and medication management.  Patient presents to the encounter stating that she continues to take her medications regularly and denies experiencing any major side effects.  She reports that her use of clonazepam  has been helpful in managing her anxiety but occasionally experiences drowsiness on the medication.  Patient presents to the encounter stating that her symptoms of depression are starting to resurface.  She reports that she is  unsure  of her depression is due to being postpartum or filing for divorce.  Since dealing with depression, patient notes that she has been more irritable and standoffish.  She also endorses poor appetite due to her anxiety.  A PHQ-9 screen was performed with the patient scored a 17.  A GAD-7 screen was also performed with the patient scoring a 21.  Due to her worsening depression and anxiety, provider recommended increasing patient's sertraline  dosage from 50 mg to 100 mg daily for the management of her depressive symptoms and anxiety.  Patient was agreeable to recommendation.  Patient's medications to be e-prescribed to pharmacy of choice.  Provider to set patient back up with a licensed clinical social worker following the conclusion of the encounter.  Collaboration of Care: Collaboration of Care: Medication Management AEB provider managing patient's psychiatric medications, Primary Care Provider AEB patient being followed by a primary care provider (family medicine), Psychiatrist AEB patient being followed by mental health provider at this facility, Other provider involved in patient's care AEB patient being seen by OB/GYN, and Referral or follow-up with counselor/therapist AEB patient being seen by a licensed clinical social worker at this facility  Patient/Guardian was advised Release of Information must be obtained prior to any record release in order to collaborate their care with an outside provider. Patient/Guardian was advised if they have not already done so to contact the registration department to sign all necessary forms in order for us  to release information regarding their care.   Consent: Patient/Guardian gives verbal consent for treatment and assignment of benefits for services provided during this visit. Patient/Guardian expressed understanding and agreed to proceed.   1. Major depressive disorder, recurrent episode, moderate with anxious distress (HCC)  - sertraline  (ZOLOFT ) 100 MG  tablet; Take 1 tablet (100 mg total) by mouth daily.  Dispense: 30 tablet; Refill: 1  2. Generalized anxiety disorder  - clonazePAM  (KLONOPIN ) 0.5 MG tablet; Take 1 tablet (0.5 mg total) by mouth 2 (two) times daily as needed for anxiety.  Dispense: 60 tablet; Refill: 0 - sertraline  (ZOLOFT ) 100 MG tablet; Take 1 tablet (100 mg total) by mouth daily.  Dispense: 30 tablet; Refill: 1  3. Panic disorder  - clonazePAM  (KLONOPIN ) 0.5 MG tablet; Take 1 tablet (0.5 mg total) by mouth 2 (two) times daily as needed for anxiety.  Dispense: 60 tablet; Refill: 0 - sertraline  (ZOLOFT ) 100 MG tablet; Take 1 tablet (100 mg total) by mouth daily.  Dispense: 30 tablet; Refill: 1  4. Insomnia due to other mental disorder  - traZODone  (DESYREL ) 50 MG tablet; Take 1 tablet (50 mg total) by mouth at bedtime.  Dispense: 30 tablet; Refill: 1  Patient to follow up in 6 weeks Provider spent a total of 27 minutes with the patient/reviewing patient's chart  [name], PA 07/09/2024, 11:30 AM

## 2024-08-01 ENCOUNTER — Other Ambulatory Visit: Payer: Self-pay | Admitting: Family Medicine

## 2024-08-01 DIAGNOSIS — M62838 Other muscle spasm: Secondary | ICD-10-CM

## 2024-08-06 ENCOUNTER — Other Ambulatory Visit: Payer: Self-pay

## 2024-08-06 DIAGNOSIS — M62838 Other muscle spasm: Secondary | ICD-10-CM

## 2024-08-06 MED ORDER — CYCLOBENZAPRINE HCL 10 MG PO TABS: 10.0000 mg | ORAL_TABLET | Freq: Two times a day (BID) | ORAL | 2 refills | Status: DC | PRN

## 2024-08-06 NOTE — Telephone Encounter (Signed)
 Pt pharmacy requesting refill for flexeril .   [place], RN

## 2024-08-19 ENCOUNTER — Ambulatory Visit (INDEPENDENT_AMBULATORY_CARE_PROVIDER_SITE_OTHER): Admitting: Clinical

## 2024-08-19 DIAGNOSIS — F331 Major depressive disorder, recurrent, moderate: Secondary | ICD-10-CM

## 2024-08-19 NOTE — Progress Notes (Signed)
   THERAPIST PROGRESS NOTE  Session Time: 60 min  Participation Level: Active  Behavioral Response: CasualAlertAnxious  Type of Therapy: Individual Therapy  Treatment Goals addressed: client will engage in at least 80% of scheduled individual psychotherapy sessions  ProgressTowards Goals: Progressing  Interventions: CBT and Supportive  Summary:  Paula Dominguez is a 43 y.o. female who presents for the scheduled appointment oriented times five, appropriately dressed and friendly. Client denied hallucinations and delusions. Client reported a lot has changed since her last appointment. Client reported she and her husband will be getting a divorce. Client reported that decision was made approx 2 months ago. Client reported they have told both of their families. Client reported her family had mixed reactions of wanting her to work through it. Client reported her husbands family was happy about the news. Client reported from her perspective since 2023 when their baby passed he was not emotionally supportive and she also found out he was having an affair with his sisters friend. Client reported he is still in the house with no indication of his plan to move out. Client reported she was angry when she found out her husband spoke with the kids first before finalizing their agreement for divorce together. Client reported she has to be careful who she talks to because her family will spread word. Client reported she is worried about maybe having a breakdown. Client reported otherwise her kids are doing well overall. Evidence of progress towards goal:  client reported 1 positive of actively working to keep her thoughts logical while highly stressed.  Suicidal/Homicidal: Nowithout intent/plan  Therapist Response:  Therapist began the appointment asking the client how she has been doing. Therapist engaged with active listening and positive emotional support. Therapist used cbt to give the  client time to discuss her thoughts and feelings about her home life stressors. Therapist used cbt to normalize her emotions. Therapist used cbt to engage with teaching her about communicating her needs and boundaries appropriately as well as her own self care habits. Therapist used CBT ask the client to identify her progress with frequency of use with coping skills with continued practice in her daily activity.    Therapist assigned the client homework to practice the skills discussed.   Plan: Return again in 4 weeks.  Diagnosis: mdd, recurrent, moderate with anxious distress  Collaboration of Care: Patient refused AEB none requested by the client.  Patient/Guardian was advised Release of Information must be obtained prior to any record release in order to collaborate their care with an outside provider. Patient/Guardian was advised if they have not already done so to contact the registration department to sign all necessary forms in order for us  to release information regarding their care.   Consent: Patient/Guardian gives verbal consent for treatment and assignment of benefits for services provided during this visit. Patient/Guardian expressed understanding and agreed to proceed.   Kalvyn Desa Y Fleeta Kunde, LCSW 08/19/2024

## 2024-08-20 ENCOUNTER — Telehealth (INDEPENDENT_AMBULATORY_CARE_PROVIDER_SITE_OTHER): Admitting: Physician Assistant

## 2024-08-20 ENCOUNTER — Encounter (HOSPITAL_COMMUNITY): Payer: Self-pay | Admitting: Physician Assistant

## 2024-08-20 DIAGNOSIS — F331 Major depressive disorder, recurrent, moderate: Secondary | ICD-10-CM | POA: Diagnosis not present

## 2024-08-20 DIAGNOSIS — F411 Generalized anxiety disorder: Secondary | ICD-10-CM

## 2024-08-20 DIAGNOSIS — F410 Panic disorder [episodic paroxysmal anxiety] without agoraphobia: Secondary | ICD-10-CM | POA: Diagnosis not present

## 2024-08-20 DIAGNOSIS — F5105 Insomnia due to other mental disorder: Secondary | ICD-10-CM | POA: Diagnosis not present

## 2024-08-20 NOTE — Progress Notes (Signed)
 BH MD/Paula Dominguez/NP OP Progress Note  Virtual Visit via Video Note  I connected with Paula Dominguez on 09/24/24 at  1:30 PM EDT by a video enabled telemedicine application and verified that I am speaking with the correct person using two identifiers.  Location: Patient: Home Provider: Clinic   I discussed the limitations of evaluation and management by telemedicine and the availability of in person appointments. The patient expressed understanding and agreed to proceed.  Follow Up Instructions:   I discussed the assessment and treatment plan with the patient. The patient was provided an opportunity to ask questions and all were answered. The patient agreed with the plan and demonstrated an understanding of the instructions.   The patient was advised to call back or seek an in-person evaluation if the symptoms worsen or if the condition fails to improve as anticipated.  I provided 23 minutes of non-face-to-face time during this encounter.   Paula FORBES Bolster, Paula Dominguez   08/20/2024 1:30 AM Paula Dominguez  MRN:  [id number]  Chief Complaint:  Chief Complaint  Patient presents with   Follow-up   Medication Refill   HPI:   Paula Dominguez is a 43 year old female with a past psychiatric history significant for generalized anxiety disorder, panic attacks, insomnia (due to other mental disorder), and major depressive disorder (recurrent episode, moderate with anxious distress) who presents to [facility] for follow-up and medication management. Patient is currently being managed on the following psychiatric medications:  Sertraline  100 mg daily Trazodone  50 mg at bedtime Clonazepam  0.5 mg 2 times daily as needed  Patient reports that she has been taking her medications regularly and denies experiencing any adverse side effects at this time.  She continues to endorse depression and rates her depression a 5 out of 10 with 10 being most  severe.  Patient endorses depressive episodes 2 to 3 days out of the week.  Patient endorses the following depressive symptoms: decreased energy, crying spells, and feelings of sadness.  Patient attributes her depressive symptoms to her marital issues and being a new mother.  Patient continues to endorse elevated anxiety and rates her anxiety a 9 out of 10.  Patient reports that when she is without her medications, her anxiety runs high.  A PHQ-9 screen was performed with the patient scoring a 7.  A GAD-7 screen was also performed with the patient scoring a 9.  Due to her anxiety, patient reports that she occasionally has to take 2 clonazepam .  When taking her clonazepam , patient reports that it takes roughly 5 to 10 minutes for the medication to kick in.  Patient reports that the clonazepam  will also roughly last 4 to 6 hours.  The only side effect that the patient experiences when taking clonazepam  is drowsiness.  Patient is alert and oriented x 4, calm, cooperative, and fully engaged in conversation during the encounter.  Patient describes her mood as nonchalant.  Patient exhibits depressed mood with congruent affect.  Patient denies suicidal or homicidal ideations.  She further denies auditory or visual hallucinations and does not appear to be responding to internal/external stimuli.  Patient endorses fair sleep and receives on average 5 hours of sleep per night.  Patient endorses decreased appetite and eats on average 1 meal per day.  Patient denies alcohol consumption, tobacco use, or illicit drug use.  Visit Diagnosis:    ICD-10-CM   1. Major depressive disorder, recurrent episode, moderate with anxious distress (HCC)  F33.1 sertraline  (ZOLOFT ) 100  MG tablet    2. Generalized anxiety disorder  F41.1 sertraline  (ZOLOFT ) 100 MG tablet    3. Panic disorder  F41.0 sertraline  (ZOLOFT ) 100 MG tablet    4. Insomnia due to other mental disorder  F51.05 traZODone  (DESYREL ) 50 MG tablet   F99         Past Psychiatric History:  Patient has a past psychiatric history significant for depression, anxiety, and panic attacks.   Patient endorses a past history of hospitalization due to mental health stating that she was hospitalized at the age of 74.  Patient reports that she was hospitalized due to ingesting Clorox in a suicide attempt.   Patient endorses a past history of suicide attempt.   Patient denies a past history of homicide attempt.  Past Medical History:  Past Medical History:  Diagnosis Date   Anemia    Anxiety and depression    Blood transfusion without reported diagnosis    2014, 2018   GERD (gastroesophageal reflux disease)    History of preterm delivery, currently pregnant 01/30/2024   Hx of preeclampsia, prior pregnancy, currently pregnant    prior pregnancy   Hypertension     Past Surgical History:  Procedure Laterality Date   HEMORRHOID SURGERY      Family Psychiatric History:  Patient denies a family history of psychiatric illness   Family history of suicide attempt: Patient denies Family history of homicide attempt: Patient denies Family history of substance abuse: Patient denies  Family History:  Family History  Problem Relation Age of Onset   Cancer Mother    Healthy Mother    Heart disease Father    Healthy Father    Asthma Daughter    Asthma Son    Hypertension Maternal Grandmother    Diabetes Maternal Grandmother    Cancer Maternal Grandmother    Cancer Maternal Grandfather    Cancer Paternal Grandmother    Cancer Paternal Grandfather    Hypertension Other    Cancer Other     Social History:  Social History   Socioeconomic History   Marital status: Married    Spouse name: Indiya Izquierdo   Number of children: 5   Years of education: Not on file   Highest education level: Not on file  Occupational History   Occupation: CERTIFIED NURSING ASSISTANT    Employer: [employer]  Tobacco Use   Smoking status: Former     Current packs/day: 0.00    Average packs/day: 0.3 packs/day for 17.0 years (4.3 ttl pk-yrs)    Types: Cigarettes    Start date: 07/27/1996    Quit date: 07/27/2013    Years since quitting: 11.1   Smokeless tobacco: Never   Tobacco comments:    3 cigs a day  Vaping Use   Vaping status: Former   Quit date: 10/14/2023   Substances: Nicotine , Flavoring  Substance and Sexual Activity   Alcohol use: No   Drug use: No   Sexual activity: Yes    Partners: Male  Other Topics Concern   Not on file  Social History Narrative   Not on file   Social Drivers of Health   Financial Resource Strain: Not on file  Food Insecurity: No Food Insecurity (03/19/2024)   Hunger Vital Sign    Worried About Running Out of Food in the Last Year: Never true    Ran Out of Food in the Last Year: Never true  Transportation Needs: No Transportation Needs (03/19/2024)   PRAPARE - Transportation  Lack of Transportation (Medical): No    Lack of Transportation (Non-Medical): No  Physical Activity: Not on file  Stress: Not on file  Social Connections: Not on file    Allergies: No Known Allergies  Metabolic Disorder Labs: Lab Results  Component Value Date   HGBA1C 5.5 11/14/2023   No results found for: PROLACTIN No results found for: CHOL, TRIG, HDL, CHOLHDL, VLDL, LDLCALC No results found for: TSH  Therapeutic Level Labs: No results found for: LITHIUM No results found for: VALPROATE No results found for: CBMZ  Current Medications: Current Outpatient Medications  Medication Sig Dispense Refill   acetaminophen  (TYLENOL ) 325 MG tablet Take 2 tablets (650 mg total) by mouth every 4 (four) hours as needed (for pain scale < 4).     acetaminophen -caffeine  (EXCEDRIN  TENSION HEADACHE) 500-65 MG TABS per tablet Take 2 tablets by mouth every 6 (six) hours as needed (headache, aches and pains, fever). 60 tablet 0   clonazePAM  (KLONOPIN ) 0.5 MG tablet Take 1 tablet (0.5 mg total) by mouth  2 (two) times daily as needed for anxiety. 60 tablet 0   cyclobenzaprine  (FLEXERIL ) 10 MG tablet Take 1 tablet (10 mg total) by mouth 2 (two) times daily as needed for muscle spasms. 20 tablet 2   ferrous sulfate  325 (65 FE) MG tablet Take 1 tablet (325 mg total) by mouth every other day. 15 tablet 0   furosemide  (LASIX ) 20 MG tablet Take 1 tablet (20 mg total) by mouth daily for 5 days. (Patient not taking: Reported on 09/03/2024) 5 tablet 0   hydrOXYzine  (ATARAX ) 10 MG tablet Take 1 tablet (10 mg total) by mouth 3 (three) times daily as needed for anxiety. (Patient not taking: Reported on 09/03/2024) 30 tablet 1   ibuprofen  (ADVIL ) 600 MG tablet Take 1 tablet (600 mg total) by mouth every 6 (six) hours. (Patient not taking: Reported on 09/03/2024) 30 tablet 0   lisinopril  (ZESTRIL ) 20 MG tablet Take 1 tablet (20 mg total) by mouth daily. 90 tablet 3   magnesium  oxide (MAG-OX) 400 MG tablet Take 1 tablet (400 mg total) by mouth daily. (Patient not taking: Reported on 09/03/2024) 90 tablet 0   Misc. Devices (WRIST BRACE) MISC 2 Devices by Does not apply route as needed. 2 each 0   Misc. Devices MISC 1 Device by Does not apply route as needed. Ergonomic keyboard for carpal tunnel syndrome (Patient not taking: Reported on 09/03/2024) 1 each 0   nicotine  (NICODERM CQ  - DOSED IN MG/24 HOURS) 14 mg/24hr patch Place 1 patch (14 mg total) onto the skin daily. 28 patch 1   nicotine  polacrilex (COMMIT) 4 MG lozenge Take 1 lozenge (4 mg total) by mouth as needed for smoking cessation. 100 tablet 0   omeprazole  (PRILOSEC) 40 MG capsule Take 1 capsule (40 mg total) by mouth in the morning and at bedtime. 180 capsule 1   oxyCODONE  (ROXICODONE ) 5 MG immediate release tablet Take 1 tablet (5 mg total) by mouth every 6 (six) hours as needed for severe pain (pain score 7-10). (Patient not taking: Reported on 09/03/2024) 10 tablet 0   polyethylene glycol powder (GLYCOLAX /MIRALAX ) 17 GM/SCOOP powder Take 17 g by mouth daily. 510 g 2    potassium chloride  SA (KLOR-CON  M) 20 MEQ tablet Take 1 tablet (20 mEq total) by mouth 2 (two) times daily. (Patient not taking: Reported on 09/03/2024) 10 tablet 0   Prenatal Vit-Fe Fumarate-FA (PNV PRENATAL PLUS MULTIVITAMIN) 27-1 MG TABS Take 1 capsule by mouth daily.  90 tablet 6   sertraline  (ZOLOFT ) 100 MG tablet Take 1 tablet (100 mg total) by mouth daily. 30 tablet 1   traZODone  (DESYREL ) 50 MG tablet Take 1 tablet (50 mg total) by mouth at bedtime. 30 tablet 1   No current facility-administered medications for this visit.     Musculoskeletal: Strength & Muscle Tone: within normal limits Gait & Station: normal Patient leans: N/A  Psychiatric Specialty Exam: Review of Systems  Psychiatric/Behavioral:  Positive for dysphoric mood and sleep disturbance. Negative for decreased concentration, hallucinations, self-injury and suicidal ideas. The patient is nervous/anxious. The patient is not hyperactive.     not currently breastfeeding.There is no height or weight on file to calculate BMI.  General Appearance: Casual  Eye Contact:  Good  Speech:  Clear and Coherent and Normal Rate  Volume:  Normal  Mood:  Anxious and Depressed  Affect:  Congruent  Thought Process:  Coherent, Goal Directed, and Descriptions of Associations: Intact  Orientation:  Full (Time, Place, and Person)  Thought Content: WDL   Suicidal Thoughts:  No  Homicidal Thoughts:  No  Memory:  Immediate;   Good Recent;   Good Remote;   Good  Judgement:  Good  Insight:  Good  Psychomotor Activity:  Normal  Concentration:  Concentration: Good and Attention Span: Good  Recall:  Good  Fund of Knowledge: Good  Language: Good  Akathisia:  No  Handed:  Right  AIMS (if indicated): not done  Assets:  Communication Skills Desire for Improvement Housing Resilience Social Support Transportation Vocational/Educational  ADL's:  Intact  Cognition: WNL  Sleep:  Fair   Screenings: GAD-7    Flowsheet Row Video  Visit from 08/20/2024 in [facility] Clinical Support from 07/09/2024 in [facility] Clinical Support from 05/09/2024 in [facility] Postpartum Visit from 05/07/2024 in Center for [facility] Office Visit from 03/27/2024 in [facility]  Total GAD-7 Score 9 21 19 15 15    PHQ2-9    Flowsheet Row Video Visit from 08/20/2024 in [facility] Clinical Support from 07/09/2024 in [facility] Clinical Support from 05/09/2024 in [facility] Postpartum Visit from 05/07/2024 in Center for [facility] Office Visit from 03/27/2024 in [facility]  PHQ-2 Total Score 2 6 1  0 4  PHQ-9 Total Score 7 17 -- 0 17   Flowsheet Row Video Visit from 08/20/2024 in [facility] Clinical Support from 07/09/2024 in [facility] Clinical Support from 05/09/2024 in [facility]  C-SSRS RISK CATEGORY Moderate Risk Moderate Risk Moderate Risk     Assessment and Plan:   Paula Dominguez is a 44 year old female with a past psychiatric history significant for generalized anxiety disorder, panic attacks, insomnia (due to other mental disorder), and major depressive disorder (recurrent episode, moderate with anxious distress) who presents to [facility] for follow-up and medication management.  Patient presents to the encounter stating that she continues to take her medications as prescribed.  In regards to her use of clonazepam , patient reports that the medication has been effective in managing her anxiety.  When her anxiety runs high, patient reports that she occasionally has to take 2 clonazepam .  The only side effect  associated with her use of clonazepam  is drowsiness.  Patient continues to endorse depression and anxiety attributed to her current marital issues and being a new mother.  A PHQ-9 screen was performed with the patient scoring a 7.  A GAD-7 screen was also performed with the patient scoring a 9.  Patient endorses stability on her current medication regimen and would like to continue taking her medications as prescribed.  Patient's medications to be e-prescribed to pharmacy of choice.  A [place] Suicide Severity Rating Scale was performed with the patient being considered moderate risk.  Patient denies suicidal ideations and is able to contract for safety at this time.  Safety planning was discussed with the patient prior to the conclusion of the encounter.  - Patient was instructed to contact 911 in the event of a mental health crisis. - Patient was instructed to contact [address] [employer] [employer] Crisis [employer] in the event of a mental health crisis. - Patient was instructed to present to [facility] in the event of a mental health crisis.  Collaboration of Care: Collaboration of Care: Medication Management AEB provider managing patient's psychiatric medications, Primary Care Provider AEB patient being followed by a primary care provider (family medicine), Psychiatrist AEB patient being followed by mental health provider at this facility, Other provider involved in patient's care AEB patient being seen by OB/GYN, and Referral or follow-up with counselor/therapist AEB patient being seen by a licensed clinical social worker at this facility  Patient/Guardian was advised Release of Information must be obtained prior to any record release in order to collaborate their care with an outside provider. Patient/Guardian was advised if they have not already done so to contact the registration department to sign all necessary forms in order for us  to release information regarding their care.    Consent: Patient/Guardian gives verbal consent for treatment and assignment of benefits for services provided during this visit. Patient/Guardian expressed understanding and agreed to proceed.   1. Major depressive disorder, recurrent episode, moderate with anxious distress (HCC)  - sertraline  (ZOLOFT ) 100 MG tablet; Take 1 tablet (100 mg total) by mouth daily.  Dispense: 30 tablet; Refill: 1  2. Generalized anxiety disorder Patient to continue taking clonazepam  0.5 mg 2 times daily as needed for the management of her generalized anxiety disorder   - sertraline  (ZOLOFT ) 100 MG tablet; Take 1 tablet (100 mg total) by mouth daily.  Dispense: 30 tablet; Refill: 1  3. Panic disorder Patient to continue taking clonazepam  0.5 mg 2 times daily as needed for the management of her panic disorder  - sertraline  (ZOLOFT ) 100 MG tablet; Take 1 tablet (100 mg total) by mouth daily.  Dispense: 30 tablet; Refill: 1  4. Insomnia due to other mental disorder  - traZODone  (DESYREL ) 50 MG tablet; Take 1 tablet (50 mg total) by mouth at bedtime.  Dispense: 30 tablet; Refill: 1  Patient to follow up in 6 weeks Provider spent a total of 23 minutes with the patient/reviewing patient's chart  Paula FORBES Bolster, Paula Dominguez 08/20/2024, 1:30 AM

## 2024-08-27 ENCOUNTER — Ambulatory Visit

## 2024-09-03 ENCOUNTER — Other Ambulatory Visit: Payer: Self-pay

## 2024-09-03 ENCOUNTER — Ambulatory Visit (INDEPENDENT_AMBULATORY_CARE_PROVIDER_SITE_OTHER)

## 2024-09-03 VITALS — BP 145/94 | HR 103 | Ht 67.0 in | Wt 219.0 lb

## 2024-09-03 DIAGNOSIS — Z3042 Encounter for surveillance of injectable contraceptive: Secondary | ICD-10-CM | POA: Diagnosis not present

## 2024-09-03 MED ORDER — MEDROXYPROGESTERONE ACETATE 150 MG/ML IM SUSY
150.0000 mg | PREFILLED_SYRINGE | Freq: Once | INTRAMUSCULAR | Status: AC
  Administered 2024-09-03: 150 mg via INTRAMUSCULAR

## 2024-09-03 NOTE — Progress Notes (Signed)
 Paula Dominguez here for Depo-Provera  Injection. Injection administered without complication. Patient will return in 3 months for next injection between 11/25 and 12/09. Next annual visit due 04/2025.   Devon, RN 09/03/2024

## 2024-09-05 ENCOUNTER — Other Ambulatory Visit (HOSPITAL_COMMUNITY): Payer: Self-pay | Admitting: Physician Assistant

## 2024-09-05 DIAGNOSIS — F410 Panic disorder [episodic paroxysmal anxiety] without agoraphobia: Secondary | ICD-10-CM

## 2024-09-05 DIAGNOSIS — F411 Generalized anxiety disorder: Secondary | ICD-10-CM

## 2024-09-11 ENCOUNTER — Ambulatory Visit (HOSPITAL_COMMUNITY): Admitting: Clinical

## 2024-09-16 NOTE — Telephone Encounter (Signed)
 received fax requesting a refil on the clonazepam  0.5mg , pt was last seen on 8-26 next appt 10-7

## 2024-09-16 NOTE — Telephone Encounter (Signed)
 received fax requesting a refill on the clonazepam  0.5mg . pt was last seen on 8-26 next appt 10-7.  according to pharmacy, pt last received filled rx on 08-01-24

## 2024-09-17 ENCOUNTER — Telehealth: Payer: Self-pay

## 2024-09-17 NOTE — Telephone Encounter (Signed)
 The patient called about increasing her  Lisinopril  prescription. The patient wasn't aware of her remaining refills at her pharmacy and thought an increase in the medication would be best to manage her blood pressure since she hadn't been taking the medication. I called the [facility] pharmacy to verify that the prescription was available with remaining refills. The patient stated she will be be picking up her lisinopril  prescription today. The patient also stated she found a PCP Jeryl Harvey) at [facility]. The patient is aware that she will need to see her PCP in the future for blood pressure management.   Devon, RN

## 2024-09-19 NOTE — Telephone Encounter (Signed)
 received another fax requesting a refill on the clonazepam  0.5mg  pt was last seen on 8-26 next appt 10-07.

## 2024-09-24 ENCOUNTER — Encounter (HOSPITAL_COMMUNITY): Payer: Self-pay | Admitting: Physician Assistant

## 2024-09-24 MED ORDER — TRAZODONE HCL 50 MG PO TABS: 50.0000 mg | ORAL_TABLET | Freq: Every day | ORAL | 1 refills | Status: DC

## 2024-09-24 MED ORDER — SERTRALINE HCL 100 MG PO TABS: 100.0000 mg | ORAL_TABLET | Freq: Every day | ORAL | 1 refills | Status: DC

## 2024-10-01 ENCOUNTER — Telehealth (INDEPENDENT_AMBULATORY_CARE_PROVIDER_SITE_OTHER): Admitting: Physician Assistant

## 2024-10-01 ENCOUNTER — Encounter (HOSPITAL_COMMUNITY): Payer: Self-pay | Admitting: Physician Assistant

## 2024-10-01 DIAGNOSIS — F99 Mental disorder, not otherwise specified: Secondary | ICD-10-CM

## 2024-10-01 DIAGNOSIS — F411 Generalized anxiety disorder: Secondary | ICD-10-CM

## 2024-10-01 DIAGNOSIS — F331 Major depressive disorder, recurrent, moderate: Secondary | ICD-10-CM

## 2024-10-01 DIAGNOSIS — F410 Panic disorder [episodic paroxysmal anxiety] without agoraphobia: Secondary | ICD-10-CM | POA: Diagnosis not present

## 2024-10-01 DIAGNOSIS — F5105 Insomnia due to other mental disorder: Secondary | ICD-10-CM

## 2024-10-01 MED ORDER — CLONAZEPAM 0.5 MG PO TABS: 0.5000 mg | ORAL_TABLET | Freq: Two times a day (BID) | ORAL | 0 refills | Status: DC | PRN

## 2024-10-01 MED ORDER — TRAZODONE HCL 50 MG PO TABS: 50.0000 mg | ORAL_TABLET | Freq: Every day | ORAL | 2 refills | Status: DC

## 2024-10-01 MED ORDER — SERTRALINE HCL 100 MG PO TABS: 100.0000 mg | ORAL_TABLET | Freq: Every day | ORAL | 2 refills | Status: DC

## 2024-10-01 NOTE — Progress Notes (Signed)
 BH MD/PA/NP OP Progress Note  Virtual Visit via Video Note  I connected with Paula Dominguez on 10/01/24 at  1:30 PM EDT by a video enabled telemedicine application and verified that I am speaking with the correct person using two identifiers.  Location: Patient: Home Provider: Clinic   I discussed the limitations of evaluation and management by telemedicine and the availability of in person appointments. The patient expressed understanding and agreed to proceed.  Follow Up Instructions:   I discussed the assessment and treatment plan with the patient. The patient was provided an opportunity to ask questions and all were answered. The patient agreed with the plan and demonstrated an understanding of the instructions.   The patient was advised to call back or seek an in-person evaluation if the symptoms worsen or if the condition fails to improve as anticipated.  I provided 22 minutes of non-face-to-face time during this encounter.  [name], PA    10/01/2024 1:30 PM Paula Dominguez  MRN:  [id number]  Chief Complaint:  Chief Complaint  Patient presents with   Follow-up   Medication Refill   HPI:   Paula Dominguez. Eilts is a 43 year old female with a past psychiatric history significant for generalized anxiety disorder, panic attacks, insomnia (due to other mental disorder), and major depressive disorder (recurrent episode, moderate with anxious distress) who presents to [facility] for follow-up and medication management. Patient is currently being managed on the following psychiatric medications:  Sertraline  100 mg daily Trazodone  50 mg at bedtime Clonazepam  0.5 mg 2 times daily as needed  Patient presents to the encounter stating that she continues to take her medications regularly and denies experiencing any adverse side effects.  Since the last encounter, patient reports that she has had an appointment with her  licensed clinical social worker who has discussed different coping skills with her.  Since utilizing these coping skills, patient reports that she feels a lot better.  In regards to her use of clonazepam , patient reports that she occasionally takes 3 clonazepam  within a day.  Provider encouraged patient to take the medication as prescribed to avoid dependency on the medication.  Patient vocalized understanding.  When taking clonazepam , patient reports that the medication typically kicks in 10 to 15 minutes after use.  Patient is unsure of how long the medication lasts once taken.  Patient denies any adverse side effects associated with her use of clonazepam .  Patient reports that her depression has been at ease and more manageable lately.  Patient rates her depression a 2-3 out of 10 with 10 being most severe.  Patient endorses depressive episodes every day but states that her symptoms do not last the whole day.  Patient endorses the following depressive symptoms: sadness, restlessness, and crying spells.  Patient also endorses anxiety and rates her anxiety a 4 out of 10.  Patient's current stressors include taking care of her newborn child and one of her children going off to college.  A PHQ-9 screening was performed with the patient scoring a 9.  A GAD-7 screen was also performed with the patient scoring a 13.  Patient is alert and oriented x 4, calm, cooperative, and fully engaged in conversation during the encounter.  Patient endorses hopeful mood.  Patient exhibits depressed mood with appropriate affect.  Patient denies suicidal or homicidal ideations.  She further denies auditory or visual hallucinations and does not appear to be responding to internal/external stimuli.  Patient endorses fair sleep and  receives on average 5 hours of sleep per night.  Patient endorses decreased appetite and eats on average 1 meal per day.  Patient denies alcohol consumption, tobacco use, or illicit drug use.  Visit  Diagnosis:    ICD-10-CM   1. Generalized anxiety disorder  F41.1 clonazePAM  (KLONOPIN ) 0.5 MG tablet    sertraline  (ZOLOFT ) 100 MG tablet    2. Panic disorder  F41.0 clonazePAM  (KLONOPIN ) 0.5 MG tablet    sertraline  (ZOLOFT ) 100 MG tablet    3. Insomnia due to other mental disorder  F51.05 traZODone  (DESYREL ) 50 MG tablet   F99     4. Major depressive disorder, recurrent episode, moderate with anxious distress (HCC)  F33.1 sertraline  (ZOLOFT ) 100 MG tablet      Past Psychiatric History:  Patient has a past psychiatric history significant for depression, anxiety, and panic attacks.   Patient endorses a past history of hospitalization due to mental health stating that she was hospitalized at the age of 37.  Patient reports that she was hospitalized due to ingesting Clorox in a suicide attempt.   Patient endorses a past history of suicide attempt.   Patient denies a past history of homicide attempt.  Past Medical History:  Past Medical History:  Diagnosis Date   Anemia    Anxiety and depression    Blood transfusion without reported diagnosis    2014, 2018   GERD (gastroesophageal reflux disease)    History of preterm delivery, currently pregnant 01/30/2024   Hx of preeclampsia, prior pregnancy, currently pregnant    prior pregnancy   Hypertension     Past Surgical History:  Procedure Laterality Date   HEMORRHOID SURGERY      Family Psychiatric History:  Patient denies a family history of psychiatric illness   Family history of suicide attempt: Patient denies Family history of homicide attempt: Patient denies Family history of substance abuse: Patient denies  Family History:  Family History  Problem Relation Age of Onset   Cancer Mother    Healthy Mother    Heart disease Father    Healthy Father    Asthma Daughter    Asthma Son    Hypertension Maternal Grandmother    Diabetes Maternal Grandmother    Cancer Maternal Grandmother    Cancer Maternal Grandfather     Cancer Paternal Grandmother    Cancer Paternal Grandfather    Hypertension Other    Cancer Other     Social History:  Social History   Socioeconomic History   Marital status: Married    Spouse name: Royal Beirne   Number of children: 5   Years of education: Not on file   Highest education level: Not on file  Occupational History   Occupation: CERTIFIED NURSING ASSISTANT    Employer: [employer]  Tobacco Use   Smoking status: Former    Current packs/day: 0.00    Average packs/day: 0.3 packs/day for 17.0 years (4.3 ttl pk-yrs)    Types: Cigarettes    Start date: 07/27/1996    Quit date: 07/27/2013    Years since quitting: 11.1   Smokeless tobacco: Never   Tobacco comments:    3 cigs a day  Vaping Use   Vaping status: Former   Quit date: 10/14/2023   Substances: Nicotine , Flavoring  Substance and Sexual Activity   Alcohol use: No   Drug use: No   Sexual activity: Yes    Partners: Male  Other Topics Concern   Not on file  Social History  Narrative   Not on file   Social Drivers of Health   Financial Resource Strain: Not on file  Food Insecurity: No Food Insecurity (03/19/2024)   Hunger Vital Sign    Worried About Running Out of Food in the Last Year: Never true    Ran Out of Food in the Last Year: Never true  Transportation Needs: No Transportation Needs (03/19/2024)   PRAPARE - Administrator, Civil Service (Medical): No    Lack of Transportation (Non-Medical): No  Physical Activity: Not on file  Stress: Not on file  Social Connections: Not on file    Allergies: No Known Allergies  Metabolic Disorder Labs: Lab Results  Component Value Date   HGBA1C 5.5 11/14/2023   No results found for: PROLACTIN No results found for: CHOL, TRIG, HDL, CHOLHDL, VLDL, LDLCALC No results found for: TSH  Therapeutic Level Labs: No results found for: LITHIUM No results found for: VALPROATE No results found for:  CBMZ  Current Medications: Current Outpatient Medications  Medication Sig Dispense Refill   acetaminophen  (TYLENOL ) 325 MG tablet Take 2 tablets (650 mg total) by mouth every 4 (four) hours as needed (for pain scale < 4).     acetaminophen -caffeine  (EXCEDRIN  TENSION HEADACHE) 500-65 MG TABS per tablet Take 2 tablets by mouth every 6 (six) hours as needed (headache, aches and pains, fever). 60 tablet 0   [START ON 10/19/2024] clonazePAM  (KLONOPIN ) 0.5 MG tablet Take 1 tablet (0.5 mg total) by mouth 2 (two) times daily as needed for anxiety. 60 tablet 0   cyclobenzaprine  (FLEXERIL ) 10 MG tablet Take 1 tablet (10 mg total) by mouth 2 (two) times daily as needed for muscle spasms. 20 tablet 2   ferrous sulfate  325 (65 FE) MG tablet Take 1 tablet (325 mg total) by mouth every other day. 15 tablet 0   furosemide  (LASIX ) 20 MG tablet Take 1 tablet (20 mg total) by mouth daily for 5 days. (Patient not taking: Reported on 09/03/2024) 5 tablet 0   hydrOXYzine  (ATARAX ) 10 MG tablet Take 1 tablet (10 mg total) by mouth 3 (three) times daily as needed for anxiety. (Patient not taking: Reported on 09/03/2024) 30 tablet 1   ibuprofen  (ADVIL ) 600 MG tablet Take 1 tablet (600 mg total) by mouth every 6 (six) hours. (Patient not taking: Reported on 09/03/2024) 30 tablet 0   lisinopril  (ZESTRIL ) 20 MG tablet Take 1 tablet (20 mg total) by mouth daily. 90 tablet 3   magnesium  oxide (MAG-OX) 400 MG tablet Take 1 tablet (400 mg total) by mouth daily. (Patient not taking: Reported on 09/03/2024) 90 tablet 0   Misc. Devices (WRIST BRACE) MISC 2 Devices by Does not apply route as needed. 2 each 0   Misc. Devices MISC 1 Device by Does not apply route as needed. Ergonomic keyboard for carpal tunnel syndrome (Patient not taking: Reported on 09/03/2024) 1 each 0   nicotine  (NICODERM CQ  - DOSED IN MG/24 HOURS) 14 mg/24hr patch Place 1 patch (14 mg total) onto the skin daily. 28 patch 1   nicotine  polacrilex (COMMIT) 4 MG lozenge Take 1  lozenge (4 mg total) by mouth as needed for smoking cessation. 100 tablet 0   omeprazole  (PRILOSEC) 40 MG capsule Take 1 capsule (40 mg total) by mouth in the morning and at bedtime. 180 capsule 1   oxyCODONE  (ROXICODONE ) 5 MG immediate release tablet Take 1 tablet (5 mg total) by mouth every 6 (six) hours as needed for severe pain (pain  score 7-10). (Patient not taking: Reported on 09/03/2024) 10 tablet 0   polyethylene glycol powder (GLYCOLAX /MIRALAX ) 17 GM/SCOOP powder Take 17 g by mouth daily. 510 g 2   potassium chloride  SA (KLOR-CON  M) 20 MEQ tablet Take 1 tablet (20 mEq total) by mouth 2 (two) times daily. (Patient not taking: Reported on 09/03/2024) 10 tablet 0   Prenatal Vit-Fe Fumarate-FA (PNV PRENATAL PLUS MULTIVITAMIN) 27-1 MG TABS Take 1 capsule by mouth daily. 90 tablet 6   sertraline  (ZOLOFT ) 100 MG tablet Take 1 tablet (100 mg total) by mouth daily. 30 tablet 2   traZODone  (DESYREL ) 50 MG tablet Take 1 tablet (50 mg total) by mouth at bedtime. 30 tablet 2   No current facility-administered medications for this visit.     Musculoskeletal: Strength & Muscle Tone: within normal limits Gait & Station: normal Patient leans: N/A  Psychiatric Specialty Exam: Review of Systems  Psychiatric/Behavioral:  Positive for dysphoric mood and sleep disturbance. Negative for decreased concentration, hallucinations, self-injury and suicidal ideas. The patient is nervous/anxious. The patient is not hyperactive.     not currently breastfeeding.There is no height or weight on file to calculate BMI.  General Appearance: Casual  Eye Contact:  Good  Speech:  Clear and Coherent and Normal Rate  Volume:  Normal  Mood:  Anxious and Depressed  Affect:  Appropriate  Thought Process:  Coherent, Goal Directed, and Descriptions of Associations: Intact  Orientation:  Full (Time, Place, and Person)  Thought Content: WDL   Suicidal Thoughts:  No  Homicidal Thoughts:  No  Memory:  Immediate;   Good Recent;    Good Remote;   Good  Judgement:  Good  Insight:  Good  Psychomotor Activity:  Normal  Concentration:  Concentration: Good and Attention Span: Good  Recall:  Good  Fund of Knowledge: Good  Language: Good  Akathisia:  No  Handed:  Right  AIMS (if indicated): not done  Assets:  Communication Skills Desire for Improvement Housing Resilience Social Support Transportation Vocational/Educational  ADL's:  Intact  Cognition: WNL  Sleep:  Fair   Screenings: GAD-7    Flowsheet Row Video Visit from 10/01/2024 in [facility] Video Visit from 08/20/2024 in [facility] Clinical Support from 07/09/2024 in [facility] Clinical Support from 05/09/2024 in [facility] Postpartum Visit from [facility]/07/2024 [facility]  Total GAD-7 Score 13 9 21 19 15    PHQ2-9    Flowsheet Row Video Visit from 10/01/2024 in [facility] Video Visit from 08/20/2024 in [facility] Clinical Support from 07/09/2024 in [facility] Clinical Support from 05/09/2024 in [facility] Postpartum Visit from 05/07/2024 in [facility]  PHQ-2 Total Score 2 2 6 1  0  PHQ-9 Total Score 9 7 17  -- 0   Flowsheet Row Video Visit from 10/01/2024 in [facility] Video Visit from 08/20/2024 in [facility] Clinical Support from 07/09/2024 in [facility]  C-SSRS RISK CATEGORY Moderate Risk Moderate Risk Moderate Risk     Assessment and Plan:   Paula Dominguez. Paula Dominguez is a 43 year old female with a past psychiatric history significant for generalized anxiety disorder, panic attacks, insomnia (due to other mental disorder), and major depressive  disorder (recurrent episode, moderate with anxious distress) who  presents to [facility] for follow-up and medication management.  Patient presents to the encounter stating that she continues to take her medications regularly and denies experiencing any adverse side effects.  In regards to her use of temazepam, patient reports that the use of the medication continues to be helpful in managing her anxiety.  She reports that she occasionally takes 3 tablets a day.  Provider encouraged patient to only take the amount prescribed on her prescription to avoid dependency.  Patient verbalized understanding.  Since meeting with our licensed clinical social worker, patient reports that her depression has been more at ease and manageable.  She continues to endorse some anxiety related to stressors in her life.  A PHQ-9 screen was performed with the patient scoring a 9.  A GAD-7 screen was also performed with the patient scoring of 13.  Patient endorses stability on her current medication regimen and would like to continue taking her medications as prescribed.  Patient's medications to be e-prescribed to pharmacy of choice.  A [place] Suicide Severity Rating Scale was performed with the patient being considered moderate risk.  Patient denies suicidal ideations and is able to contract for safety at this time.  Safety planning was discussed with the patient prior to the conclusion of the encounter.  - Patient was instructed to contact 911 in the event of a mental health crisis. - Patient was instructed to contact [address] Su[facility] and Crisis [employer] in the event of a mental health crisis. - Patient was instructed to present to [facility] in the event of a mental health crisis.  Collaboration of Care: Collaboration of Care: Medication Management AEB provider managing patient's psychiatric medications, Primary Care Provider AEB patient being followed by  a primary care provider (family medicine), Psychiatrist AEB patient being followed by mental health provider at this facility, Other provider involved in patient's care AEB patient being seen by OB/GYN, and Referral or follow-up with counselor/therapist AEB patient being seen by a licensed clinical social worker at this facility  Patient/Guardian was advised Release of Information must be obtained prior to any record release in order to collaborate their care with an outside provider. Patient/Guardian was advised if they have not already done so to contact the registration department to sign all necessary forms in order for us  to release information regarding their care.   Consent: Patient/Guardian gives verbal consent for treatment and assignment of benefits for services provided during this visit. Patient/Guardian expressed understanding and agreed to proceed.   1. Generalized anxiety disorder  - clonazePAM  (KLONOPIN ) 0.5 MG tablet; Take 1 tablet (0.5 mg total) by mouth 2 (two) times daily as needed for anxiety.  Dispense: 60 tablet; Refill: 0 - sertraline  (ZOLOFT ) 100 MG tablet; Take 1 tablet (100 mg total) by mouth daily.  Dispense: 30 tablet; Refill: 2  2. Panic disorder  - clonazePAM  (KLONOPIN ) 0.5 MG tablet; Take 1 tablet (0.5 mg total) by mouth 2 (two) times daily as needed for anxiety.  Dispense: 60 tablet; Refill: 0 - sertraline  (ZOLOFT ) 100 MG tablet; Take 1 tablet (100 mg total) by mouth daily.  Dispense: 30 tablet; Refill: 2  3. Insomnia due to other mental disorder  - traZODone  (DESYREL ) 50 MG tablet; Take 1 tablet (50 mg total) by mouth at bedtime.  Dispense: 30 tablet; Refill: 2  4. Major depressive disorder, recurrent episode, moderate with anxious distress (HCC)  - sertraline  (ZOLOFT ) 100 MG tablet; Take 1 tablet (100 mg total)  by mouth daily.  Dispense: 30 tablet; Refill: 2  Patient to follow up in 2 months Provider spent a total of 22 minutes with the patient/reviewing  patient's chart  [name], PA 10/01/2024, 1:30 PM

## 2024-10-04 ENCOUNTER — Encounter (HOSPITAL_COMMUNITY): Payer: Self-pay

## 2024-10-09 ENCOUNTER — Ambulatory Visit (HOSPITAL_COMMUNITY): Admitting: Clinical

## 2024-10-16 ENCOUNTER — Encounter (HOSPITAL_COMMUNITY): Payer: Self-pay

## 2024-10-23 ENCOUNTER — Ambulatory Visit (HOSPITAL_COMMUNITY): Admitting: Clinical

## 2024-10-23 ENCOUNTER — Encounter (HOSPITAL_COMMUNITY): Payer: Self-pay

## 2024-11-07 ENCOUNTER — Ambulatory Visit: Admitting: Family Medicine

## 2024-11-07 VITALS — Wt 216.2 lb

## 2024-11-07 DIAGNOSIS — G5603 Carpal tunnel syndrome, bilateral upper limbs: Secondary | ICD-10-CM

## 2024-11-07 DIAGNOSIS — M62838 Other muscle spasm: Secondary | ICD-10-CM

## 2024-11-07 DIAGNOSIS — Z1231 Encounter for screening mammogram for malignant neoplasm of breast: Secondary | ICD-10-CM | POA: Diagnosis not present

## 2024-11-07 DIAGNOSIS — F411 Generalized anxiety disorder: Secondary | ICD-10-CM | POA: Diagnosis not present

## 2024-11-07 MED ORDER — CYCLOBENZAPRINE HCL 10 MG PO TABS: 10.0000 mg | ORAL_TABLET | Freq: Two times a day (BID) | ORAL | 2 refills | Status: AC | PRN

## 2024-11-07 NOTE — Assessment & Plan Note (Signed)
 Discussed at this point she will have had three injections and she needs to see a hand surgeon to discuss further interventions, she is in agreement with this plan.  Bilateral injections done without complication.

## 2024-11-07 NOTE — Progress Notes (Signed)
 Carpal Tunnel Injection Procedure Note After obtaining informed verbal and written consent for carpal tunnel injection, proper site was identified on the left wrist 1 cm medial to the palmaris longus tendon and 1 cm proximal to the distal palmar crease. The area was cleaned with alcohol. A mixture of 1 cc 6 mg/mL Betamethasone  and 1 cc of 1% lidocaine  was injected by directing the needle towards the base of the thumb at a 45 degree angle at a depth of approximately 1 cm. Mixture flowed easily and patient reported no paresthesias during the procedure. A bandaid was then applied and patient instructed to mobilize wrist for remainder of day to help solution spread. Patient tolerated the procedure well.  The other wrist was injected in the same fashion without complication and was well tolerated.    5:54 PM 11/07/24   Paula CHRISTELLA Carolus, MD/MPH Attending Family Medicine Physician, [facility] for [facility], [facility]

## 2024-11-07 NOTE — Assessment & Plan Note (Signed)
 Ongoing issues and difficulty with child care, discussed I am OK writing a letter in support of a few more months of ongoing work from home but that I can not write a letter in support of that for any further length of time.

## 2024-11-07 NOTE — Progress Notes (Signed)
 GYNECOLOGY OFFICE VISIT NOTE  History:   Paula Dominguez is a 43 y.o. [id number] here today for carpal tunnel symptoms.  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  Would also like work letter to continue to work from home so she can better coordinate her child care   Health Maintenance Due  Topic Date Due   Hepatitis B Vaccines 19-59 Average Risk (1 of 3 - 19+ 3-dose series) Never done   HPV VACCINES (1 - 3-dose SCDM series) Never done   Mammogram  Never done   Influenza Vaccine  07/26/2024   COVID-19 Vaccine (1 - 2025-26 season) Never done    Past Medical History:  Diagnosis Date   Anemia    Anxiety and depression    Blood transfusion without reported diagnosis    2014, 2018   GERD (gastroesophageal reflux disease)    History of preterm delivery, currently pregnant 01/30/2024   Hx of preeclampsia, prior pregnancy, currently pregnant    prior pregnancy   Hypertension     Past Surgical History:  Procedure Laterality Date   HEMORRHOID SURGERY      The following portions of the patient's history were reviewed and updated as appropriate: allergies, current medications, past family history, past medical history, past social history, past surgical history and problem list.   Health Maintenance:   Last pap: Result Date Procedure Results Follow-ups  11/14/2023 Cytology - PAP( [place]) High risk HPV: Negative Adequacy: Satisfactory for evaluation.  The absence of an Adequacy: endocervical/transformation zone component is not uncommon in pregnant Adequacy: patients. Diagnosis: - Negative for intraepithelial lesion or malignancy (NILM) Microorganisms: Fungal organisms present consistent with Candida spp. Comment: Normal Reference Range HPV - Negative   02/19/2021 Cytology - PAP High risk HPV: Negative Adequacy:  Satisfactory for evaluation; transformation zone component PRESENT. Diagnosis: - Atypical squamous cells of undetermined significance (ASC-US ) (A) Comment: Normal Reference Range HPV - Negative   06/06/2018 Cytology - PAP Adequacy: Satisfactory for evaluation  endocervical/transformation zone component PRESENT. Diagnosis: NEGATIVE FOR INTRAEPITHELIAL LESIONS OR MALIGNANCY. HPV: NOT DETECTED Material Submitted: CervicoVaginal Pap [ThinPrep Imaged] CYTOLOGY - PAP: PAP RESULT      Last mammogram:  NEEDS    Review of Systems:  Pertinent items noted in HPI and remainder of comprehensive ROS otherwise negative.  Physical Exam:  Wt 216 lb 3 oz (98.1 kg)   BMI 33.86 kg/m  CONSTITUTIONAL: Well-developed, well-nourished female in no acute distress.  HEENT:  Normocephalic, atraumatic. External right and left ear normal. No scleral icterus.  NECK: Normal range of motion, supple, no masses noted on observation SKIN: No rash noted. Not diaphoretic. No erythema. No pallor. MUSCULOSKELETAL: Normal range of motion. No edema noted. NEUROLOGIC: Alert and oriented to person, place, and time. Normal muscle tone coordination.  PSYCHIATRIC: Normal mood and affect. Normal behavior. Normal judgment and thought content. RESPIRATORY: Effort normal, no problems with respiration noted  Labs and Imaging No results found for this or any previous visit (from the past week). No results found.    Assessment and Plan:   Problem List Items Addressed This Visit       Nervous and Auditory   Bilateral carpal tunnel syndrome - Primary   Discussed at this point she will have had three injections and she needs to see a hand surgeon to  discuss further interventions, she is in agreement with this plan.  Bilateral injections done without complication.       Relevant Medications   cyclobenzaprine  (FLEXERIL ) 10 MG tablet   Other Relevant Orders   Ambulatory referral to Hand Surgery     Other   Generalized anxiety  disorder   Ongoing issues and difficulty with child care, discussed I am OK writing a letter in support of a few more months of ongoing work from home but that I can not write a letter in support of that for any further length of time.       Other Visit Diagnoses       Muscle spasm       Relevant Medications   cyclobenzaprine  (FLEXERIL ) 10 MG tablet     Encounter for screening mammogram for malignant neoplasm of breast       Relevant Orders   MM Digital Screening       Routine preventative health maintenance measures emphasized. Please refer to After Visit Summary for other counseling recommendations.   Return if symptoms worsen or fail to improve.    Total face-to-face time with patient: 25 minutes.  Over 50% of encounter was spent on counseling and coordination of care.   Paula CHRISTELLA Carolus, MD/MPH Attending Family Medicine Physician, [facility] for [facility], [facility]

## 2024-11-13 ENCOUNTER — Encounter: Payer: Self-pay | Admitting: Family Medicine

## 2024-11-19 ENCOUNTER — Ambulatory Visit

## 2024-11-25 ENCOUNTER — Other Ambulatory Visit: Payer: Self-pay | Admitting: Family Medicine

## 2024-11-25 DIAGNOSIS — K219 Gastro-esophageal reflux disease without esophagitis: Secondary | ICD-10-CM

## 2024-11-25 DIAGNOSIS — O0990 Supervision of high risk pregnancy, unspecified, unspecified trimester: Secondary | ICD-10-CM

## 2024-11-28 ENCOUNTER — Ambulatory Visit (INDEPENDENT_AMBULATORY_CARE_PROVIDER_SITE_OTHER): Admitting: *Deleted

## 2024-11-28 ENCOUNTER — Other Ambulatory Visit: Payer: Self-pay

## 2024-11-28 VITALS — BP 172/123 | HR 93 | Ht 66.0 in | Wt 219.1 lb

## 2024-11-28 DIAGNOSIS — Z3042 Encounter for surveillance of injectable contraceptive: Secondary | ICD-10-CM | POA: Diagnosis not present

## 2024-11-28 MED ORDER — MEDROXYPROGESTERONE ACETATE 150 MG/ML IM SUSY
150.0000 mg | PREFILLED_SYRINGE | Freq: Once | INTRAMUSCULAR | Status: AC
  Administered 2024-11-28: 150 mg via INTRAMUSCULAR

## 2024-11-28 NOTE — Progress Notes (Signed)
 Paula Dominguez here for Depo-Provera  Injection. Injection administered without complication. Patient will return in 3 months for next injection between 02/13/25 and 02/27/25. Next annual visit due after 05/07/25. BP elevated at 174/119. Is taking Lisinopril  30 mg daily. Will see PCP in January . Repeat BP 172/123. Advised to go to Urgent care or her PCP for BP management. Discusses risks of untreated HTN. She voices understanding.  Rock Skip PEAK 11/28/2024  3:22 PM

## 2024-12-03 ENCOUNTER — Telehealth (HOSPITAL_COMMUNITY): Admitting: Physician Assistant

## 2024-12-03 ENCOUNTER — Encounter (HOSPITAL_COMMUNITY): Payer: Self-pay | Admitting: Physician Assistant

## 2024-12-03 DIAGNOSIS — F410 Panic disorder [episodic paroxysmal anxiety] without agoraphobia: Secondary | ICD-10-CM

## 2024-12-03 DIAGNOSIS — F5105 Insomnia due to other mental disorder: Secondary | ICD-10-CM

## 2024-12-03 DIAGNOSIS — F331 Major depressive disorder, recurrent, moderate: Secondary | ICD-10-CM

## 2024-12-03 DIAGNOSIS — F411 Generalized anxiety disorder: Secondary | ICD-10-CM

## 2024-12-03 MED ORDER — CLONAZEPAM 0.5 MG PO TABS: 0.5000 mg | ORAL_TABLET | Freq: Two times a day (BID) | ORAL | 0 refills | Status: AC | PRN

## 2024-12-03 MED ORDER — SERTRALINE HCL 100 MG PO TABS: 100.0000 mg | ORAL_TABLET | Freq: Every day | ORAL | 2 refills | Status: AC

## 2024-12-03 MED ORDER — TRAZODONE HCL 50 MG PO TABS: 50.0000 mg | ORAL_TABLET | Freq: Every day | ORAL | 2 refills | Status: AC

## 2024-12-03 NOTE — Progress Notes (Signed)
 [facility] MD/PA/NP OP Progress Note  Virtual Visit via Video Note  I connected with Paula Dominguez on 12/03/24 at  1:30 PM EST by a video enabled telemedicine application and verified that I am speaking with the correct person using two identifiers.  Location: Patient: Home Provider: Clinic   I discussed the limitations of evaluation and management by telemedicine and the availability of in person appointments. The patient expressed understanding and agreed to proceed.  Follow Up Instructions:   I discussed the assessment and treatment plan with the patient. The patient was provided an opportunity to ask questions and all were answered. The patient agreed with the plan and demonstrated an understanding of the instructions.   The patient was advised to call back or seek an in-person evaluation if the symptoms worsen or if the condition fails to improve as anticipated.  I provided 20 minutes of non-face-to-face time during this encounter.  Paula FORBES Bolster, PA    12/03/2024 1:30 PM Paula Dominguez  MRN:  [id number]  Chief Complaint:  Chief Complaint  Patient presents with   Follow-up   Medication Refill   HPI:   Paula Dominguez is a 43 year old female with a past psychiatric history significant for generalized anxiety disorder, panic attacks, insomnia (due to other mental disorder), and major depressive disorder (recurrent episode, moderate with anxious distress) who presents to [facility] via virtual video visit for follow-up and medication management. Patient is currently being managed on the following psychiatric medications:  Sertraline  100 mg daily Trazodone  50 mg at bedtime Clonazepam  0.5 mg 2 times daily as needed  Patient presents to the encounter stating that she tried to go few days without taking her clonazepam .  She reports that when she was not taking her clonazepam , she still experienced elevated anxiety.   Patient reports that she has been having some good days but on her bad days, she experiences jitteriness, restlessness, and anxiousness especially when going outside.  Patient reports that she has been trying to incorporate more reading and activeness in her life to help improve mood.  Today, patient feels more focused and calm.  She endorses minimal depression and states that her anxiety is currently low.  Patient's only stressors she is currently going through are her divorce and taking care of her kids.  A PHQ-9 screening was performed patient scoring an 11.  A GAD-7 screen was also performed with the patient scoring a 14.  Patient is alert and oriented x 4, calm, cooperative, and fully engaged in conversation during the encounter.  Patient endorses good mood.  Patient exhibits euthymic mood with appropriate affect.  Patient denies suicidal or homicidal ideations.  She further denies auditory or visual hallucinations and does not appear to be responding to internal/external stimuli.  Patient endorses fair sleep and receives on average 5 hours of sleep per night.  Patient states that her sleep hygiene is gradually improving.  Patient endorses poor appetite and eats on average 1 meal per day.  Patient denies alcohol consumption, tobacco use, or illicit drug use.  Visit Diagnosis:    ICD-10-CM   1. Generalized anxiety disorder  F41.1 clonazePAM  (KLONOPIN ) 0.5 MG tablet    sertraline  (ZOLOFT ) 100 MG tablet    2. Panic disorder  F41.0 clonazePAM  (KLONOPIN ) 0.5 MG tablet    sertraline  (ZOLOFT ) 100 MG tablet    3. Insomnia due to other mental disorder  F51.05 traZODone  (DESYREL ) 50 MG tablet   F99  4. Major depressive disorder, recurrent episode, moderate with anxious distress (HCC)  F33.1 sertraline  (ZOLOFT ) 100 MG tablet      Past Psychiatric History:  Patient has a past psychiatric history significant for depression, anxiety, and panic attacks.   Patient endorses a past history of  hospitalization due to mental health stating that she was hospitalized at the age of 59.  Patient reports that she was hospitalized due to ingesting Clorox in a suicide attempt.   Patient endorses a past history of suicide attempt.   Patient denies a past history of homicide attempt.  Past Medical History:  Past Medical History:  Diagnosis Date   Anemia    Anxiety and depression    Blood transfusion without reported diagnosis    2014, 2018   GERD (gastroesophageal reflux disease)    History of preterm delivery, currently pregnant 01/30/2024   Hx of preeclampsia, prior pregnancy, currently pregnant    prior pregnancy   Hypertension     Past Surgical History:  Procedure Laterality Date   HEMORRHOID SURGERY      Family Psychiatric History:  Patient denies a family history of psychiatric illness   Family history of suicide attempt: Patient denies Family history of homicide attempt: Patient denies Family history of substance abuse: Patient denies  Family History:  Family History  Problem Relation Age of Onset   Cancer Mother    Healthy Mother    Heart disease Father    Healthy Father    Asthma Daughter    Asthma Son    Hypertension Maternal Grandmother    Diabetes Maternal Grandmother    Cancer Maternal Grandmother    Cancer Maternal Grandfather    Cancer Paternal Grandmother    Cancer Paternal Grandfather    Hypertension Other    Cancer Other     Social History:  Social History   Socioeconomic History   Marital status: Married    Spouse name: Paula Dominguez   Number of children: 5   Years of education: Not on file   Highest education level: Not on file  Occupational History   Occupation: CERTIFIED NURSING ASSISTANT    Employer: [employer]  Tobacco Use   Smoking status: Former    Current packs/day: 0.00    Average packs/day: 0.3 packs/day for 17.0 years (4.3 ttl pk-yrs)    Types: Cigarettes    Start date: 07/27/1996    Quit date: 07/27/2013     Years since quitting: 11.3   Smokeless tobacco: Never   Tobacco comments:    3 cigs a day  Vaping Use   Vaping status: Former   Quit date: 10/14/2023   Substances: Nicotine , Flavoring  Substance and Sexual Activity   Alcohol use: No   Drug use: No   Sexual activity: Yes    Partners: Male  Other Topics Concern   Not on file  Social History Narrative   Not on file   Social Drivers of Health   Financial Resource Strain: Not on file  Food Insecurity: No Food Insecurity (03/19/2024)   Hunger Vital Sign    Worried About Running Out of Food in the Last Year: Never true    Ran Out of Food in the Last Year: Never true  Transportation Needs: No Transportation Needs (03/19/2024)   PRAPARE - Administrator, Civil Service (Medical): No    Lack of Transportation (Non-Medical): No  Physical Activity: Not on file  Stress: Not on file  Social Connections: Not on file  Allergies: No Known Allergies  Metabolic Disorder Labs: Lab Results  Component Value Date   HGBA1C 5.5 11/14/2023   No results found for: PROLACTIN No results found for: CHOL, TRIG, HDL, CHOLHDL, VLDL, LDLCALC No results found for: TSH  Therapeutic Level Labs: No results found for: LITHIUM No results found for: VALPROATE No results found for: CBMZ  Current Medications: Current Outpatient Medications  Medication Sig Dispense Refill   acetaminophen  (TYLENOL ) 325 MG tablet Take 2 tablets (650 mg total) by mouth every 4 (four) hours as needed (for pain scale < 4).     acetaminophen -caffeine  (EXCEDRIN  TENSION HEADACHE) 500-65 MG TABS per tablet Take 2 tablets by mouth every 6 (six) hours as needed (headache, aches and pains, fever). 60 tablet 0   clonazePAM  (KLONOPIN ) 0.5 MG tablet Take 1 tablet (0.5 mg total) by mouth 2 (two) times daily as needed for anxiety. 60 tablet 0   cyclobenzaprine  (FLEXERIL ) 10 MG tablet Take 1 tablet (10 mg total) by mouth 2 (two) times daily as needed for  muscle spasms. 20 tablet 2   ferrous sulfate  325 (65 FE) MG tablet Take 1 tablet (325 mg total) by mouth every other day. 15 tablet 0   ibuprofen  (ADVIL ) 600 MG tablet Take 1 tablet (600 mg total) by mouth every 6 (six) hours. (Patient taking differently: Take 600 mg by mouth every 6 (six) hours as needed.) 30 tablet 0   lisinopril  (ZESTRIL ) 10 MG tablet Take 10 mg by mouth daily.     lisinopril  (ZESTRIL ) 20 MG tablet Take 1 tablet (20 mg total) by mouth daily. 90 tablet 3   Misc. Devices (WRIST BRACE) MISC 2 Devices by Does not apply route as needed. 2 each 0   Misc. Devices MISC 1 Device by Does not apply route as needed. Ergonomic keyboard for carpal tunnel syndrome 1 each 0   nicotine  (NICODERM CQ  - DOSED IN MG/24 HOURS) 14 mg/24hr patch Place 1 patch (14 mg total) onto the skin daily. 28 patch 1   nicotine  polacrilex (COMMIT) 4 MG lozenge Take 1 lozenge (4 mg total) by mouth as needed for smoking cessation. 100 tablet 0   omeprazole  (PRILOSEC) 40 MG capsule Take 1 capsule (40 mg total) by mouth in the morning and at bedtime. 180 capsule 1   polyethylene glycol powder (GLYCOLAX /MIRALAX ) 17 GM/SCOOP powder Take 17 g by mouth daily. (Patient taking differently: Take 17 g by mouth as needed.) 510 g 2   Prenatal Vit-Fe Fumarate-FA (PNV PRENATAL PLUS MULTIVITAMIN) 27-1 MG TABS Take 1 capsule by mouth daily. 90 tablet 6   sertraline  (ZOLOFT ) 100 MG tablet Take 1 tablet (100 mg total) by mouth daily. 30 tablet 2   traZODone  (DESYREL ) 50 MG tablet Take 1 tablet (50 mg total) by mouth at bedtime. 30 tablet 2   No current facility-administered medications for this visit.     Musculoskeletal: Strength & Muscle Tone: within normal limits Gait & Station: normal Patient leans: N/A  Psychiatric Specialty Exam: Review of Systems  Psychiatric/Behavioral:  Positive for dysphoric mood and sleep disturbance. Negative for decreased concentration, hallucinations, self-injury and suicidal ideas. The patient  is nervous/anxious. The patient is not hyperactive.     not currently breastfeeding.There is no height or weight on file to calculate BMI.  General Appearance: Casual  Eye Contact:  Good  Speech:  Clear and Coherent and Normal Rate  Volume:  Normal  Mood:  Anxious and Depressed  Affect:  Appropriate  Thought Process:  Coherent, Goal Directed,  and Descriptions of Associations: Intact  Orientation:  Full (Time, Place, and Person)  Thought Content: WDL   Suicidal Thoughts:  No  Homicidal Thoughts:  No  Memory:  Immediate;   Good Recent;   Good Remote;   Good  Judgement:  Good  Insight:  Good  Psychomotor Activity:  Normal  Concentration:  Concentration: Good and Attention Span: Good  Recall:  Good  Fund of Knowledge: Good  Language: Good  Akathisia:  No  Handed:  Right  AIMS (if indicated): not done  Assets:  Communication Skills Desire for Improvement Housing Resilience Social Support Transportation Vocational/Educational  ADL's:  Intact  Cognition: WNL  Sleep:  Fair   Screenings: GAD-7    Flowsheet Row Video Visit from 12/03/2024 in [facility] Video Visit from 10/01/2024 in [facility] Video Visit from 08/20/2024 in [facility] Clinical Support from 07/09/2024 in [facility] Clinical Support from 05/09/2024 in [facility]  Total GAD-7 Score 14 13 9 21 19    PHQ2-9    Flowsheet Row Video Visit from 12/03/2024 in [facility] Video Visit from 10/01/2024 in [facility] Video Visit from 08/20/2024 in [facility] Clinical Support from 07/09/2024 in [facility] Clinical Support from 05/09/2024 in [facility]  PHQ-2 Total Score 2 2 2 6 1   PHQ-9 Total Score 11 9 7 17  --   Flowsheet Row Video Visit from  12/03/2024 in [facility] Video Visit from 10/01/2024 in [facility] Video Visit from 08/20/2024 in [facility]  C-SSRS RISK CATEGORY Moderate Risk Moderate Risk Moderate Risk     Assessment and Plan:   Paula Dominguez is a 43 year old female with a past psychiatric history significant for generalized anxiety disorder, panic attacks, insomnia (due to other mental disorder), and major depressive disorder (recurrent episode, moderate with anxious distress) who presents to [facility] via virtual video visit for follow-up and medication management.  Patient presents to the encounter stating that she continues to take her medications regularly.  She reports that she tried going a few days without taking clonazepam  so as to be less reliant on it but she still experience elevated anxiety.  Today, patient reports that her depression is minimal and her anxiety has been low.  She reports that she has been incorporating reading and being more active to help improve her mood.  A PHQ-9 screening was performed with the patient scoring an 11.  A GAD-7 screen was also performed the patient scoring a 14.  Patient endorses stability on her current medication regimen and denies the need for dosage adjustment at this time.  Patient's medications to be e-prescribed to pharmacy of choice.  A [place] Suicide Severity Rating Scale was performed with the patient being considered moderate risk.  Patient denies suicidal ideations and is able to contract for safety at this time.  Safety planning was discussed with the patient prior to the conclusion of the encounter.  - Patient was instructed to contact 911 in the event of a mental health crisis. - Patient was instructed to contact [facility] [facility] [employer] in the event of a mental health crisis. - Patient was instructed to present to [facility] in the event of a mental health crisis.  Collaboration  of Care: Collaboration of Care: Medication Management AEB provider managing patient's psychiatric medications, Primary Care Provider AEB patient being followed by a primary care provider (family medicine), Psychiatrist AEB patient being followed by mental health provider at this facility, Other provider involved in patient's care AEB patient being seen by OB/GYN, and Referral or follow-up with counselor/therapist AEB patient being seen by a licensed clinical social worker at this facility  Patient/Guardian was advised Release of Information must be obtained prior to any record release in order to collaborate their care with an outside provider. Patient/Guardian was advised if they have not already done so to contact the registration department to sign all necessary forms in order for us  to release information regarding their care.   Consent: Patient/Guardian gives verbal consent for treatment and assignment of benefits for services provided during this visit. Patient/Guardian expressed understanding and agreed to proceed.   1. Generalized anxiety disorder  - clonazePAM  (KLONOPIN ) 0.5 MG tablet; Take 1 tablet (0.5 mg total) by mouth 2 (two) times daily as needed for anxiety.  Dispense: 60 tablet; Refill: 0 - sertraline  (ZOLOFT ) 100 MG tablet; Take 1 tablet (100 mg total) by mouth daily.  Dispense: 30 tablet; Refill: 2  2. Panic disorder  - clonazePAM  (KLONOPIN ) 0.5 MG tablet; Take 1 tablet (0.5 mg total) by mouth 2 (two) times daily as needed for anxiety.  Dispense: 60 tablet; Refill: 0 - sertraline  (ZOLOFT ) 100 MG tablet; Take 1 tablet (100 mg total) by mouth daily.  Dispense: 30 tablet; Refill: 2  3. Insomnia due to other mental disorder  - traZODone  (DESYREL ) 50 MG tablet; Take 1 tablet (50 mg total) by mouth at bedtime.  Dispense: 30 tablet; Refill: 2  4. Major depressive disorder, recurrent episode,  moderate with anxious distress (HCC)  - sertraline  (ZOLOFT ) 100 MG tablet; Take 1 tablet (100 mg total) by mouth daily.  Dispense: 30 tablet; Refill: 2  Patient to follow up in 2 months Provider spent a total of 20 minutes with the patient/reviewing patient's chart  Paula FORBES Bolster, PA 12/03/2024, 1:30 PM

## 2025-01-30 ENCOUNTER — Encounter: Payer: Self-pay | Admitting: Family Medicine

## 2025-02-04 ENCOUNTER — Telehealth (HOSPITAL_COMMUNITY): Admitting: Physician Assistant

## 2025-02-13 ENCOUNTER — Ambulatory Visit
